# Patient Record
Sex: Female | Born: 1985 | Race: Black or African American | Hispanic: No | Marital: Single | Smoking: Never smoker
Health system: Southern US, Community
[De-identification: ages and names within clinical notes are randomized; demographics above are authoritative.]

## PROBLEM LIST (undated history)

## (undated) DIAGNOSIS — D631 Anemia in chronic kidney disease: Secondary | ICD-10-CM

## (undated) DIAGNOSIS — Z992 Dependence on renal dialysis: Secondary | ICD-10-CM

## (undated) DIAGNOSIS — F319 Bipolar disorder, unspecified: Secondary | ICD-10-CM

## (undated) DIAGNOSIS — N760 Acute vaginitis: Secondary | ICD-10-CM

## (undated) DIAGNOSIS — N189 Chronic kidney disease, unspecified: Secondary | ICD-10-CM

## (undated) DIAGNOSIS — Z765 Malingerer [conscious simulation]: Secondary | ICD-10-CM

## (undated) DIAGNOSIS — Z8614 Personal history of Methicillin resistant Staphylococcus aureus infection: Secondary | ICD-10-CM

## (undated) DIAGNOSIS — I422 Other hypertrophic cardiomyopathy: Secondary | ICD-10-CM

## (undated) DIAGNOSIS — E1043 Type 1 diabetes mellitus with diabetic autonomic (poly)neuropathy: Secondary | ICD-10-CM

## (undated) DIAGNOSIS — E785 Hyperlipidemia, unspecified: Secondary | ICD-10-CM

## (undated) DIAGNOSIS — Z9119 Patient's noncompliance with other medical treatment and regimen: Secondary | ICD-10-CM

## (undated) DIAGNOSIS — I82409 Acute embolism and thrombosis of unspecified deep veins of unspecified lower extremity: Secondary | ICD-10-CM

## (undated) DIAGNOSIS — H548 Legal blindness, as defined in USA: Secondary | ICD-10-CM

## (undated) DIAGNOSIS — N10 Acute tubulo-interstitial nephritis: Secondary | ICD-10-CM

## (undated) DIAGNOSIS — I10 Essential (primary) hypertension: Secondary | ICD-10-CM

## (undated) DIAGNOSIS — Z91199 Patient's noncompliance with other medical treatment and regimen due to unspecified reason: Secondary | ICD-10-CM

## (undated) DIAGNOSIS — K319 Disease of stomach and duodenum, unspecified: Secondary | ICD-10-CM

## (undated) DIAGNOSIS — R569 Unspecified convulsions: Secondary | ICD-10-CM

## (undated) DIAGNOSIS — I38 Endocarditis, valve unspecified: Secondary | ICD-10-CM

## (undated) DIAGNOSIS — D649 Anemia, unspecified: Secondary | ICD-10-CM

## (undated) HISTORY — PX: AV FISTULA PLACEMENT: SHX1204

## (undated) HISTORY — PX: OTHER SURGICAL HISTORY: SHX169

## (undated) HISTORY — DX: Acute vaginitis: N76.0

## (undated) HISTORY — DX: Legal blindness, as defined in USA: H54.8

## (undated) HISTORY — DX: Acute embolism and thrombosis of unspecified deep veins of unspecified lower extremity: I82.409

## (undated) HISTORY — DX: Other hypertrophic cardiomyopathy: I42.2

## (undated) HISTORY — DX: Anemia, unspecified: D64.9

## (undated) HISTORY — DX: Essential (primary) hypertension: I10

## (undated) HISTORY — PX: THROMBECTOMY AND REVISION OF ARTERIOVENTOUS (AV) GORETEX  GRAFT: SHX6120

## (undated) HISTORY — DX: Hyperlipidemia, unspecified: E78.5

## (undated) HISTORY — DX: Personal history of Methicillin resistant Staphylococcus aureus infection: Z86.14

## (undated) HISTORY — DX: Bipolar disorder, unspecified: F31.9

---

## 2009-05-16 ENCOUNTER — Ambulatory Visit: Payer: Self-pay | Admitting: Cardiovascular Disease

## 2009-05-16 ENCOUNTER — Inpatient Hospital Stay (HOSPITAL_COMMUNITY): Admission: EM | Admit: 2009-05-16 | Discharge: 2009-05-24 | Payer: Self-pay | Admitting: Emergency Medicine

## 2009-05-17 ENCOUNTER — Encounter (INDEPENDENT_AMBULATORY_CARE_PROVIDER_SITE_OTHER): Payer: Self-pay | Admitting: Internal Medicine

## 2009-09-04 ENCOUNTER — Ambulatory Visit: Payer: Self-pay | Admitting: Cardiology

## 2009-12-23 ENCOUNTER — Ambulatory Visit: Payer: Self-pay | Admitting: Cardiology

## 2009-12-23 ENCOUNTER — Inpatient Hospital Stay (HOSPITAL_COMMUNITY)
Admission: EM | Admit: 2009-12-23 | Discharge: 2009-12-30 | Payer: Self-pay | Source: Home / Self Care | Admitting: Emergency Medicine

## 2009-12-25 ENCOUNTER — Encounter (INDEPENDENT_AMBULATORY_CARE_PROVIDER_SITE_OTHER): Payer: Self-pay | Admitting: Internal Medicine

## 2010-01-16 ENCOUNTER — Ambulatory Visit (HOSPITAL_COMMUNITY): Payer: Self-pay | Admitting: Oncology

## 2010-01-16 ENCOUNTER — Encounter (HOSPITAL_COMMUNITY)
Admission: RE | Admit: 2010-01-16 | Discharge: 2010-02-15 | Payer: Self-pay | Source: Home / Self Care | Attending: Oncology | Admitting: Oncology

## 2010-03-13 ENCOUNTER — Other Ambulatory Visit (HOSPITAL_COMMUNITY): Payer: Self-pay

## 2010-03-15 ENCOUNTER — Ambulatory Visit (HOSPITAL_COMMUNITY): Payer: Self-pay | Admitting: Oncology

## 2010-03-17 ENCOUNTER — Inpatient Hospital Stay (HOSPITAL_COMMUNITY)
Admission: EM | Admit: 2010-03-17 | Discharge: 2010-03-24 | DRG: 603 | Disposition: A | Discharge status: Home / Self Care | Attending: Family Medicine | Admitting: Family Medicine

## 2010-03-17 DIAGNOSIS — IMO0002 Reserved for concepts with insufficient information to code with codable children: Secondary | ICD-10-CM | POA: Diagnosis present

## 2010-03-17 DIAGNOSIS — L03317 Cellulitis of buttock: Principal | ICD-10-CM

## 2010-03-17 DIAGNOSIS — A4902 Methicillin resistant Staphylococcus aureus infection, unspecified site: Secondary | ICD-10-CM | POA: Diagnosis present

## 2010-03-17 DIAGNOSIS — Z86718 Personal history of other venous thrombosis and embolism: Secondary | ICD-10-CM

## 2010-03-17 DIAGNOSIS — R0789 Other chest pain: Secondary | ICD-10-CM | POA: Diagnosis not present

## 2010-03-17 DIAGNOSIS — Z794 Long term (current) use of insulin: Secondary | ICD-10-CM

## 2010-03-17 DIAGNOSIS — E871 Hypo-osmolality and hyponatremia: Secondary | ICD-10-CM | POA: Diagnosis present

## 2010-03-17 DIAGNOSIS — Z9119 Patient's noncompliance with other medical treatment and regimen: Secondary | ICD-10-CM

## 2010-03-17 DIAGNOSIS — F319 Bipolar disorder, unspecified: Secondary | ICD-10-CM | POA: Diagnosis present

## 2010-03-17 DIAGNOSIS — Z91199 Patient's noncompliance with other medical treatment and regimen due to unspecified reason: Secondary | ICD-10-CM

## 2010-03-17 DIAGNOSIS — I129 Hypertensive chronic kidney disease with stage 1 through stage 4 chronic kidney disease, or unspecified chronic kidney disease: Secondary | ICD-10-CM | POA: Diagnosis present

## 2010-03-17 DIAGNOSIS — D638 Anemia in other chronic diseases classified elsewhere: Secondary | ICD-10-CM | POA: Diagnosis present

## 2010-03-17 DIAGNOSIS — L0231 Cutaneous abscess of buttock: Principal | ICD-10-CM | POA: Diagnosis present

## 2010-03-17 DIAGNOSIS — E1065 Type 1 diabetes mellitus with hyperglycemia: Secondary | ICD-10-CM | POA: Diagnosis present

## 2010-03-17 DIAGNOSIS — E876 Hypokalemia: Secondary | ICD-10-CM | POA: Diagnosis present

## 2010-03-17 DIAGNOSIS — N189 Chronic kidney disease, unspecified: Secondary | ICD-10-CM | POA: Diagnosis present

## 2010-03-17 DIAGNOSIS — N920 Excessive and frequent menstruation with regular cycle: Secondary | ICD-10-CM | POA: Diagnosis present

## 2010-03-17 LAB — BASIC METABOLIC PANEL
BUN: 19 mg/dL (ref 6–23)
Creatinine, Ser: 1.9 mg/dL — ABNORMAL HIGH (ref 0.4–1.2)
GFR calc non Af Amer: 33 mL/min — ABNORMAL LOW (ref >60)
Glucose, Bld: 783 mg/dL (ref 70–99)

## 2010-03-17 LAB — DIFFERENTIAL
Basophils Relative: 0 % (ref 0–1)
Eosinophils Relative: 0 % (ref 0–5)
Lymphs Abs: 1.1 10*3/uL (ref 0.7–4.0)
Monocytes Absolute: 0.7 10*3/uL (ref 0.1–1.0)
Neutro Abs: 8.8 10*3/uL — ABNORMAL HIGH (ref 1.7–7.7)

## 2010-03-17 LAB — CBC
HCT: 23.3 % — ABNORMAL LOW (ref 36.0–46.0)
MCH: 30.9 pg (ref 26.0–34.0)
MCHC: 36.5 g/dL — ABNORMAL HIGH (ref 30.0–36.0)
MCV: 84.7 fL (ref 78.0–100.0)
Platelets: 268 10*3/uL (ref 150–400)
RDW: 15.2 % (ref 11.5–15.5)

## 2010-03-17 LAB — GLUCOSE, CAPILLARY: Glucose-Capillary: 509 mg/dL — ABNORMAL HIGH (ref 70–99)

## 2010-03-17 LAB — PROTIME-INR: INR: 0.97 (ref 0.00–1.49)

## 2010-03-17 LAB — APTT: aPTT: 45 seconds — ABNORMAL HIGH (ref 24–37)

## 2010-03-18 LAB — DIFFERENTIAL
Basophils Absolute: 0 10*3/uL (ref 0.0–0.1)
Eosinophils Absolute: 0.1 10*3/uL (ref 0.0–0.7)
Eosinophils Relative: 1 % (ref 0–5)

## 2010-03-18 LAB — IRON AND TIBC
Iron: 16 ug/dL — ABNORMAL LOW (ref 42–135)
TIBC: 140 ug/dL — ABNORMAL LOW (ref 250–470)
UIBC: 124 ug/dL

## 2010-03-18 LAB — GLUCOSE, CAPILLARY
Glucose-Capillary: 246 mg/dL — ABNORMAL HIGH (ref 70–99)
Glucose-Capillary: 160 mg/dL — ABNORMAL HIGH (ref 70–99)
Glucose-Capillary: 208 mg/dL — ABNORMAL HIGH (ref 70–99)
Glucose-Capillary: 268 mg/dL — ABNORMAL HIGH (ref 70–99)

## 2010-03-18 LAB — BASIC METABOLIC PANEL
BUN: 17 mg/dL (ref 6–23)
Calcium: 7.9 mg/dL — ABNORMAL LOW (ref 8.4–10.5)
GFR calc non Af Amer: 32 mL/min — ABNORMAL LOW (ref >60)
Glucose, Bld: 134 mg/dL — ABNORMAL HIGH (ref 70–99)
Sodium: 141 mEq/L (ref 135–145)

## 2010-03-18 LAB — CBC
Platelets: 258 10*3/uL (ref 150–400)
RDW: 15.5 % (ref 11.5–15.5)
WBC: 10.5 10*3/uL (ref 4.0–10.5)

## 2010-03-18 LAB — FOLATE: Folate: 12.4 ng/mL

## 2010-03-19 ENCOUNTER — Inpatient Hospital Stay (HOSPITAL_COMMUNITY)

## 2010-03-19 LAB — BASIC METABOLIC PANEL
GFR calc Af Amer: 35 mL/min — ABNORMAL LOW (ref >60)
GFR calc non Af Amer: 29 mL/min — ABNORMAL LOW (ref >60)
Potassium: 3.6 mEq/L (ref 3.5–5.1)
Sodium: 140 mEq/L (ref 135–145)

## 2010-03-19 LAB — VANCOMYCIN, TROUGH: Vancomycin Tr: 15.8 ug/mL (ref 10.0–20.0)

## 2010-03-19 LAB — DIFFERENTIAL
Basophils Absolute: 0 10*3/uL (ref 0.0–0.1)
Basophils Relative: 0 % (ref 0–1)
Eosinophils Absolute: 0.1 10*3/uL (ref 0.0–0.7)
Eosinophils Relative: 1 % (ref 0–5)
Lymphocytes Relative: 20 % (ref 12–46)

## 2010-03-19 LAB — GLUCOSE, CAPILLARY
Glucose-Capillary: 380 mg/dL — ABNORMAL HIGH (ref 70–99)
Glucose-Capillary: 297 mg/dL — ABNORMAL HIGH (ref 70–99)
Glucose-Capillary: 248 mg/dL — ABNORMAL HIGH (ref 70–99)
Glucose-Capillary: 141 mg/dL — ABNORMAL HIGH (ref 70–99)

## 2010-03-19 LAB — CBC
HCT: 25.9 % — ABNORMAL LOW (ref 36.0–46.0)
Platelets: 314 10*3/uL (ref 150–400)
RDW: 15.8 % — ABNORMAL HIGH (ref 11.5–15.5)
WBC: 8 10*3/uL (ref 4.0–10.5)

## 2010-03-20 DIAGNOSIS — Z86718 Personal history of other venous thrombosis and embolism: Secondary | ICD-10-CM

## 2010-03-20 LAB — GLUCOSE, CAPILLARY
Glucose-Capillary: 86 mg/dL (ref 70–99)
Glucose-Capillary: 100 mg/dL — ABNORMAL HIGH (ref 70–99)
Glucose-Capillary: 138 mg/dL — ABNORMAL HIGH (ref 70–99)

## 2010-03-20 LAB — BASIC METABOLIC PANEL
BUN: 17 mg/dL (ref 6–23)
Calcium: 8.1 mg/dL — ABNORMAL LOW (ref 8.4–10.5)
Creatinine, Ser: 2.27 mg/dL — ABNORMAL HIGH (ref 0.4–1.2)
GFR calc Af Amer: 32 mL/min — ABNORMAL LOW (ref >60)
GFR calc non Af Amer: 27 mL/min — ABNORMAL LOW (ref >60)

## 2010-03-20 LAB — CBC
HCT: 22.2 % — ABNORMAL LOW (ref 36.0–46.0)
Hemoglobin: 7.6 g/dL — ABNORMAL LOW (ref 12.0–15.0)
RBC: 2.48 MIL/uL — ABNORMAL LOW (ref 3.87–5.11)

## 2010-03-20 LAB — DIFFERENTIAL
Basophils Absolute: 0 10*3/uL (ref 0.0–0.1)
Basophils Relative: 0 % (ref 0–1)
Lymphocytes Relative: 16 % (ref 12–46)
Monocytes Absolute: 0.7 10*3/uL (ref 0.1–1.0)
Monocytes Relative: 9 % (ref 3–12)
Neutro Abs: 6 10*3/uL (ref 1.7–7.7)
Neutrophils Relative %: 73 % (ref 43–77)

## 2010-03-20 LAB — HEMOGLOBIN AND HEMATOCRIT, BLOOD: Hemoglobin: 8.9 g/dL — ABNORMAL LOW (ref 12.0–15.0)

## 2010-03-20 LAB — PROTIME-INR
INR: 1.18 (ref 0.00–1.49)
Prothrombin Time: 15.2 seconds (ref 11.6–15.2)

## 2010-03-20 LAB — RETICULOCYTES
RBC.: 2.48 MIL/uL — ABNORMAL LOW (ref 3.87–5.11)
Retic Ct Pct: 2 % (ref 0.4–3.1)

## 2010-03-21 DIAGNOSIS — Z86718 Personal history of other venous thrombosis and embolism: Secondary | ICD-10-CM

## 2010-03-21 DIAGNOSIS — D649 Anemia, unspecified: Secondary | ICD-10-CM

## 2010-03-21 LAB — BASIC METABOLIC PANEL
BUN: 17 mg/dL (ref 6–23)
Calcium: 8.2 mg/dL — ABNORMAL LOW (ref 8.4–10.5)
Chloride: 115 mEq/L — ABNORMAL HIGH (ref 96–112)
Creatinine, Ser: 2.06 mg/dL — ABNORMAL HIGH (ref 0.4–1.2)

## 2010-03-21 LAB — HEMOGLOBIN AND HEMATOCRIT, BLOOD
HCT: 27.8 % — ABNORMAL LOW (ref 36.0–46.0)
Hemoglobin: 9.1 g/dL — ABNORMAL LOW (ref 12.0–15.0)

## 2010-03-21 LAB — CULTURE, ROUTINE-ABSCESS

## 2010-03-21 LAB — GLUCOSE, CAPILLARY
Glucose-Capillary: 72 mg/dL (ref 70–99)
Glucose-Capillary: 59 mg/dL — ABNORMAL LOW (ref 70–99)
Glucose-Capillary: 140 mg/dL — ABNORMAL HIGH (ref 70–99)
Glucose-Capillary: 200 mg/dL — ABNORMAL HIGH (ref 70–99)

## 2010-03-21 LAB — PROTIME-INR: Prothrombin Time: 14.2 seconds (ref 11.6–15.2)

## 2010-03-22 ENCOUNTER — Inpatient Hospital Stay (HOSPITAL_COMMUNITY)

## 2010-03-22 DIAGNOSIS — R079 Chest pain, unspecified: Secondary | ICD-10-CM

## 2010-03-22 LAB — CBC
Hemoglobin: 8.4 g/dL — ABNORMAL LOW (ref 12.0–15.0)
MCHC: 31.5 g/dL (ref 30.0–36.0)
Platelets: 390 10*3/uL (ref 150–400)
RBC: 2.89 MIL/uL — ABNORMAL LOW (ref 3.87–5.11)

## 2010-03-22 LAB — CROSSMATCH
ABO/RH(D): O POS
Unit division: 0

## 2010-03-22 LAB — GLUCOSE, CAPILLARY
Glucose-Capillary: 49 mg/dL — ABNORMAL LOW (ref 70–99)
Glucose-Capillary: 78 mg/dL (ref 70–99)
Glucose-Capillary: 90 mg/dL (ref 70–99)
Glucose-Capillary: 105 mg/dL — ABNORMAL HIGH (ref 70–99)
Glucose-Capillary: 72 mg/dL (ref 70–99)
Glucose-Capillary: 84 mg/dL (ref 70–99)

## 2010-03-22 LAB — PROTIME-INR: INR: 1.48 (ref 0.00–1.49)

## 2010-03-22 LAB — DIFFERENTIAL
Basophils Absolute: 0 10*3/uL (ref 0.0–0.1)
Basophils Relative: 0 % (ref 0–1)
Eosinophils Absolute: 0.2 10*3/uL (ref 0.0–0.7)
Neutro Abs: 4.8 10*3/uL (ref 1.7–7.7)
Neutrophils Relative %: 69 % (ref 43–77)

## 2010-03-22 LAB — CARDIAC PANEL(CRET KIN+CKTOT+MB+TROPI): CK, MB: 6 ng/mL — ABNORMAL HIGH (ref 0.3–4.0)

## 2010-03-22 MED ORDER — TECHNETIUM TO 99M ALBUMIN AGGREGATED
5.0000 | Freq: Once | INTRAVENOUS | Status: AC | PRN
  Administered 2010-03-22: 5.4 via INTRAVENOUS

## 2010-03-22 MED ORDER — XENON XE 133 GAS
10.0000 | GAS_FOR_INHALATION | Freq: Once | RESPIRATORY_TRACT | Status: AC | PRN
  Administered 2010-03-22: 16.7 via RESPIRATORY_TRACT

## 2010-03-22 NOTE — Discharge Summary (Signed)
  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  The patient admitted to the hospital with left buttock cellulitis.  Abscess was suspected.  She was seen in consultation by Surgery and performed ultrasound, which revealed no evidence of abscess.  Her culture of wound grew out gram-positive cocci in pairs and clusters, abundant staph aureus, which was further tested positive for MRSA.  Her anemia panel was performed.  She was transfused 1 unit on admission due to hemoglobin of 7.2  Her hemoglobin rose nicely to 9.2, and there was no diminished ferritin.  Seen in consultation by Hematology, was felt this was to be anemia of chronic disease.  Her hypertension was well controlled.  She defervesced on vancomycin and then had proper glycemic control with her long-acting Lantus insulin and the petrous sliding scale before meals and at bedtime.  She had her Coumadin resumed.  Her pro time was diminished while in the hospital and we subsequently wrote prescriptions for Coumadin to alter 5 mg every day alternating with 2.5 mg every day.  Her discharge diagnoses include: 1. Type 1 diabetes. 2. Recurrent DVT. 3. Anticoagulation. 4. Anemia of chronic disease. 5. Manic depressive disorder. 6.  Hypertension. 7. Chronic noncompliance. 8. Renal failure, chronic. 9. Light chains in the urine.  Her discharge medicines included: 1. Amlodipine 10 mg per day. 2. Clonidine 0.3 b.i.d. 3. Lexapro 20 mg per day. 4. Lantus insulin 36 units at bedtime. 5. Apidra insulin sliding scale before meals and at bedtime according     to sliding scale given. 6. Lisinopril 40 mg per day. 7. Potassium chloride 20 mEq per day. 8. Pravachol 40 mg per day. 9. Coumadin 5 mg alternating with 2.5 mg every other day.  The patient will follow up in the office in one week's time to check her pro time, to check her anemia, check her hemodynamics and blood pressure, assess buttock cellulitis which is not an abscess, and to follow up with Dr. Thornton Papas office for anemia of chronic disease as well as light chain evidenced in the urine.     Melvyn Novas, MD     RMD/MEDQ  D:  03/22/2010  T:  03/22/2010  Job:  [id number]  Electronically Signed by Oval Linsey MD on 03/22/2010 03:38:04 PM

## 2010-03-23 DIAGNOSIS — R072 Precordial pain: Secondary | ICD-10-CM

## 2010-03-23 LAB — PROTIME-INR: INR: 1.61 — ABNORMAL HIGH (ref 0.00–1.49)

## 2010-03-23 LAB — MAGNESIUM: Magnesium: 1.7 mg/dL (ref 1.5–2.5)

## 2010-03-23 LAB — GLUCOSE, CAPILLARY
Glucose-Capillary: 68 mg/dL — ABNORMAL LOW (ref 70–99)
Glucose-Capillary: 84 mg/dL (ref 70–99)

## 2010-03-23 LAB — DIFFERENTIAL
Basophils Absolute: 0 10*3/uL (ref 0.0–0.1)
Basophils Relative: 1 % (ref 0–1)
Eosinophils Absolute: 0.2 10*3/uL (ref 0.0–0.7)
Eosinophils Relative: 3 % (ref 0–5)
Lymphs Abs: 1.4 10*3/uL (ref 0.7–4.0)
Neutrophils Relative %: 62 % (ref 43–77)

## 2010-03-23 LAB — CBC
MCV: 92 fL (ref 78.0–100.0)
Platelets: 494 10*3/uL — ABNORMAL HIGH (ref 150–400)
RBC: 2.51 MIL/uL — ABNORMAL LOW (ref 3.87–5.11)
RDW: 16 % — ABNORMAL HIGH (ref 11.5–15.5)
WBC: 6.1 10*3/uL (ref 4.0–10.5)

## 2010-03-23 LAB — BASIC METABOLIC PANEL
BUN: 18 mg/dL (ref 6–23)
Calcium: 8 mg/dL — ABNORMAL LOW (ref 8.4–10.5)
Chloride: 116 mEq/L — ABNORMAL HIGH (ref 96–112)
Creatinine, Ser: 2.07 mg/dL — ABNORMAL HIGH (ref 0.4–1.2)
GFR calc Af Amer: 36 mL/min — ABNORMAL LOW (ref >60)
GFR calc non Af Amer: 30 mL/min — ABNORMAL LOW (ref >60)

## 2010-03-23 LAB — KAPPA/LAMBDA LIGHT CHAINS
Kappa free light chain: 3.76 mg/dL — ABNORMAL HIGH (ref 0.33–1.94)
Kappa, lambda light chain ratio: 1.9 — ABNORMAL HIGH (ref 0.26–1.65)
Lambda free light chains: 1.98 mg/dL (ref 0.57–2.63)

## 2010-03-24 LAB — GLUCOSE, CAPILLARY: Glucose-Capillary: 108 mg/dL — ABNORMAL HIGH (ref 70–99)

## 2010-03-24 LAB — PROTIME-INR
INR: 1.7 — ABNORMAL HIGH (ref 0.00–1.49)
Prothrombin Time: 20.2 seconds — ABNORMAL HIGH (ref 11.6–15.2)

## 2010-03-24 LAB — CULTURE, BLOOD (ROUTINE X 2): Culture: NO GROWTH

## 2010-03-24 NOTE — Progress Notes (Signed)
  [name], [name]             ACCOUNT NO.:  [id number]  MEDICAL RECORD NO.:  [id number]           PATIENT TYPE:  I  LOCATION:  A301                          FACILITY:  [facility]  PHYSICIAN:  Mila Homer. Sudie Bailey, M.D.DATE OF BIRTH:  23-Oct-1985  DATE OF PROCEDURE: DATE OF DISCHARGE:  03/17/2010                                PROGRESS NOTE   SUBJECTIVE:  She is still having a lot of pain from the left buttock. Her sugar has dropped down to the 100 range from the 750 count she had when she came in.  Nursing and I discussed this frequently over the last night.  The patient tells me she has been anemic over the years.  She does not have heavy menstrual periods.  OBJECTIVE:  VITAL SIGNS: Temperature 97.9, pulse 103, respiratory rate 16, blood pressure 109/66. GENERAL: She is in the right lateral decubitus position.  She is in distress due to pain from the left buttock. HEART: Regular rhythm rate of about 100 today. LUNGS:  Clear throughout and moving air well. BUTTOCKS: She still has tight swelling and tenderness, with some induration over the left buttock in an area at least 4 inch diameter, with a central skin breakdown, but no real drainage from this.  LABORATORY DATA:  Today's white cell count was 10,500, hemoglobin 7.2. Potassium 2.9, BUN 17, creatinine 1.93.  ASSESSMENT: 1. Left buttock abscess, probably secondary to Methicillin-resistant     Staphylococcus aureus . 2. Insulin-dependent diabetes, uncontrolled, but improved since     hospitalization. 3. Hypokalemia. 4. Chronic anemia. 5. History of proteinuria. 6. Renal insufficiency probably based on dehydration 7. Dehydration.  PLAN: 1. Continue with the vancomycin IV. 2. Anemia profile is pending. 3. Transfuse 1 unit of packed cells today. 4. KCl 40 mEq by mouth oral suspension b.i.d. 5. Recheck a CBC and BMP in the morning. 6. Continue with IV fluids.     Mila Homer. Sudie Bailey, M.D.     SDK/MEDQ  D:   03/18/2010  T:  03/18/2010  Job:  [id number]  Electronically Signed by John Giovanni M.D. on 03/24/2010 05:30:50 AM

## 2010-03-24 NOTE — H&P (Signed)
[name], [name]             ACCOUNT NO.:  [id number]  MEDICAL RECORD NO.:  [id number]           PATIENT TYPE:  E  LOCATION:  APED                          FACILITY:  [facility]  PHYSICIAN:  Mila Homer. Sudie Bailey, M.D.DATE OF BIRTH:  07-31-1985  DATE OF ADMISSION:  03/17/2010 DATE OF DISCHARGE:  LH                             HISTORY & PHYSICAL   This is a 25 year old patient of Melvyn Novas, MD presented to the [facility] emergency department today with severe pain in the left buttock.  She has long-term problems of insulin-dependent diabetes, which she has had since age 25, and hypertension.  On admission here in the Fall she was also found to have anasarca and edema.  She has a history of DVT and was at one point on Warfarin.  She has been taken care of by a number of physicians in the past including Dr. in [place], the [facility] at this hospital this fall, most recently Dr. Janna Arch.  She is currently on Lantus insulin and unknown blood pressure meds.  A panel shows a slight change in her urine.  She is due to see Ladona Horns. Neijstrom, MD, hematologist/oncologist, but I do not know if this was done.  Financial considerations are big factor in her not getting some of these things accomplished.  Currently she is in severe pain from this.  She is currently single, but lives with her boyfriend.  She denies alcohol use, smoking, illicit drugs.  PHYSICAL EXAMINATION:  GENERAL:  Exam showed a young woman who is obviously in severe distress due to pain.  She was oriented, alert, well- developed, well-nourished and in distress due to pain. VITAL SIGNS:  Temperature is 98.4, blood pressure 178/117, recheck was 163/98, pulse 125, respiratory rate 22. HEENT:  The mucous membranes were moist. SKIN:  Turgor was normal. HEART:  Her heart had a rate of 130 and regular without murmur. LUNGS:  The lungs appeared clear throughout. ABDOMEN:  The  abdomen was soft without organomegaly or mass or tenderness.  She had an area of tenderness and swelling of the left buttock region which was at least 4 inch diameter.  A scabbed over area centrally had been cultured by the ER staff before I arrived.  LABORATORY DATA:  Admission lab work included a CBC which showed a white cell count of 10,600, of which 83% neutrophils, 10 lymphs.  Hemoglobin 8.5.  Sodium 126, potassium 2.6, chloride 96, bicarbonate 10, glucose 783, BUN 19, creatinine 1.9.  Prolactin acid was 2.7 (normal is 0.5 to 2.2).  INR was 0.97 and PTT 45.  Recent x-rays, the CK of the chest done in December 2011 which showed no intrathoracic malignancy and improved anasarca and resolved pleural effusions.  She also had a CT of the abdomen and pelvis in November 2011 which showed anasarca thought to be probably secondary to fluid overload, and also anemia.  Venous Doppler done November 2011 showed an extensive, partially occlusive problem in the common femoral vein on the right extending to the femoral vein and marked subcutaneous edema.  This was similar to a prior study, April 2011,  thought to be chronic.  ADMISSION DIAGNOSES: 1. Left buttock abscess, probably methicillin resistant Staphylococcus     aureus. 2. Insulin dependent diabetes, uncontrolled. 3. Hypokalemia. 4. Hyponatremia. 5. Dehydration. 6. History of deep vein thrombosis of the right leg. 7. Anemia. 8. Proteinuria..  A surgeon has already been contacted and will I and D the abscess.  A culture of the abscess is already pending, as noted above.  She is on IV fluids and will receive potassium by mouth due to her bad veins.  We will be following her sugar closely and put her on an insulin drip, if need be.  She will be IV vancomycin.     Mila Homer. Sudie Bailey, M.D.     SDK/MEDQ  D:  03/17/2010  T:  03/17/2010  Job:  [id number]  Electronically Signed by John Giovanni M.D. on 03/24/2010 05:30:41 AM

## 2010-03-31 NOTE — Consult Note (Signed)
[name], [name] NO.:  [id number]  MEDICAL RECORD NO.:  [id number]           PATIENT TYPE:  LOCATION:                                 FACILITY:  PHYSICIAN:  Jonelle Sidle, MD DATE OF BIRTH:  21-Jun-1985  DATE OF CONSULTATION:  03/22/2010 DATE OF DISCHARGE:                                CONSULTATION   PRIMARY CARDIOLOGIST:  New, Dr. Nona Dell.  PRIMARY CARE PHYSICIAN:  Melvyn Novas, MD  REASON FOR CONSULTATION:  Chest pain.  HISTORY OF PRESENT ILLNESS:  This is a 25 year old African American female admitted with chest pain and left buttock cellulitis and pain. She had been treated with antibiotics, no I and D per surgery as they did an ultrasound and found that she does not have an abscess.  She has planned to go home today but continued to have substernal chest pain, radiating chest pain with tightness but unable to take a deep breath causing her to hurt, also it hurts to move her upper torso.  She states that this chest pain began 5 days ago with worsening severity.  She has been treated for Staph aureus and also is found to be very anemic with a blood hemoglobin of 7.2 and did receive a blood transfusion.  However, the pain continues as constant, worse with movement and palpation requiring a lot of pain medication.  We were requested to make further recommendations.  REVIEW OF SYSTEMS:  Positive for chest pain, difficulty taking deep breaths causing pain, pain in her chest with movement, painful left buttock from cellulitis.  She has generalized aches and pains of her joints.  All other systems are reviewed and are found to be negative.  PAST MEDICAL HISTORY: 1. Type 1 diabetes. 2. Recurrent DVT, anticoagulation for this. 3. Anemia of chronic disease.     a.     currently under consult by Dr. Mariel Sleet during this      hospitalization. 4. Manic depressive disorder. 5. Hypertension. 6. Chronic renal failure. 7. Chronic  noncompliance. 8. Hypercholesterolemia. 9. Most recent echocardiogram November 2011 reveals an EF of 55% with     no wall motion abnormalities at that time.  PAST SURGICAL HISTORY:  Cyst removal from her arm and leg.  SOCIAL HISTORY:  She lives in [place] with her boyfriend.  She is unemployed.  She does not smoke, drink, or use drugs.  FAMILY HISTORY:  Mother with diabetes.  Father is alive but unknown health issues.  She has 7 brothers and 1 sister, but she does not know their health status.  CURRENT MEDICATIONS:  Prior to admission: 1. Pravachol 40 mg at bedtime. 2. Norvasc 10 mg daily. 3. Warfarin 5 mg daily. 4. Lantus insulin 36 units daily. 5. Clonidine 0.3 mg b.i.d. 6. Apidra insulin per sliding scale.  ALLERGIES:  No known drug allergies.  CURRENT LABORATORY DATA:  Sodium 142, potassium 3.7, chloride 115, CO2 of 18, BUN 17, creatinine 2.0, and glucose 70.  Hemoglobin 8.4, hematocrit 26.7, white blood cells 7.1, and platelets 390.  Troponin 0.01.  She is positive for MRSA.  PT 14.2 and INR 1.0.  EKG  revealing normal sinus rhythm, tachycardia with a rate of 92 beats per minute with nonspecific T-wave changes.  Chest x-ray revealing question right pleural effusion.  PHYSICAL EXAMINATION:  VITAL SIGNS:  Blood pressure 107/72, pulse 86, respirations 16, temperature 97.6, and O2 saturation 100% on room air. GENERAL:  She is lethargic, sleepy, but responds to questions. HEENT:  Head is normocephalic and atraumatic.  Eyes:  PERRLA. NECK:  Supple without JVD or carotid bruits appreciated. CARDIOVASCULAR:  Tachycardic with a soft systolic murmur.  Pulses are 2+ and equal without bruits. LUNGS:  Diminished breath sounds with poor inspiratory effort.  It hurts to take a deep breath. ABDOMEN:  Soft and nontender with 2+ bowel sounds.  There is no rebound or guarding. EXTREMITIES:  Pretibial edema 1+ is noted. MUSCULOSKELETAL:  She has no joint deformity or effusions.  She  does have multiple tenderness in the neck, shoulders, and sternum with movement. NEUROLOGIC:  Diminished with sedation.  IMPRESSION: 1. Chest pain, atypical features.  Pain is described as constant,     worse with movement, deep breaths, and palpation.  We doubt cardiac     etiology for this pain.  However, we cannot rule out myocarditis or     pericardial effusion at this time.  Therefore, we will plan an     echocardiogram for left ventricular function and evaluation for     same.  The patient will also be checked for pulmonary embolism, but     the D-dimer and then need to consider V/Q lung scan if elevated as     she has chronic kidney disease. 2. Anemia.  Decreased hemoglobin to 8.4 from 9.1 overnight from     February 15 to March 22, 2010.  She is being followed by Dr.     Mariel Sleet and they will continue to make recommendations for same. 3. Lower extremity edema.  The patient states this is new, uncertain     of congestive heart failure as lungs are clear, but she is     diminished with poor inspiratory effort.  Pleural effusion is noted     on chest x-ray.  We will plan decubitus films on the right for  further evaluation and documentation of size.  On behalf of the physicians and providers of [facility], we would like to thank Dr. Janna Arch for allowing [place] to participate in the care of this patient.     Bettey Mare. Lyman Bishop, NP   ______________________________ Jonelle Sidle, MD    KML/MEDQ  D:  03/22/2010  T:  03/22/2010  Job:  [id number]  cc:   Melvyn Novas, MD Fax: [phone]  Electronically Signed by Joni Reining NP on 03/29/2010 02:00:13 PM Electronically Signed by Nona Dell MD on 03/31/2010 08:09:44 PM

## 2010-04-16 ENCOUNTER — Ambulatory Visit (HOSPITAL_COMMUNITY): Payer: Self-pay | Admitting: Oncology

## 2010-04-16 ENCOUNTER — Encounter (HOSPITAL_COMMUNITY): Attending: Oncology

## 2010-04-16 DIAGNOSIS — Z86718 Personal history of other venous thrombosis and embolism: Secondary | ICD-10-CM | POA: Insufficient documentation

## 2010-04-16 DIAGNOSIS — I82409 Acute embolism and thrombosis of unspecified deep veins of unspecified lower extremity: Secondary | ICD-10-CM

## 2010-04-16 DIAGNOSIS — Z7901 Long term (current) use of anticoagulants: Secondary | ICD-10-CM | POA: Insufficient documentation

## 2010-04-16 NOTE — Consult Note (Signed)
[name], [name]             ACCOUNT NO.:  [id number]  MEDICAL RECORD NO.:  [id number]           PATIENT TYPE:  LOCATION:                                 FACILITY:  PHYSICIAN:  Ladona Horns. Mariel Sleet, MD  DATE OF BIRTH:  June 02, 1985  DATE OF CONSULTATION: DATE OF DISCHARGE:                                CONSULTATION   REFERRING PHYSICIAN:  Melvyn Novas, MD  PRIMARY CARE PHYSICIAN:  Dr. Janna Arch  REASON FOR REFERRAL:  Anemia.  HISTORY OF PRESENT ILLNESS:  This is a 25 year old African American female with a past medical history significant for type 1 diabetes, recurrent DVT, manic depressive disorder, hypertension, anemia, acute renal failure light chains in urine, and depression who presented to the [facility] emergency department with severe pain in the left buttock.  She was admitted to the hospital due to a left buttock abscess which at that time was suspected to be methicillin-resistant Staphylococcus aureus.  The surgeon was contacted and I and D the abscess. Culture of the abscess is pending and the preliminary results reveal abundant white blood cells present.  Gram-positive cocci in pairs and in clusters.  Abundant Staphylococcus aureus.  An anemia panel was performed, and the patient was transfused 1 unit of packed red blood cells on March 18, 2010.  At that point in time, her hemoglobin was 7.2  and hematocrit  was 20.1.  Her hemoglobin rose nicely and was 9.2 on February 13.  Today, March 20, 2010, the patient's hemoglobin is 8.9 and hematocrit is 26.5.  The patient explains me that she continues to have heavy menstrual cycles.  This could correlate with her anemia.  She tells me she has not had any DVTs since I last saw her in the clinic on January 16, 2010.  She states she has been doing well at home since.  The patient tells me that she is comfortable.  She is moving her bowels well.  She is passing urine without difficulty.   The patient also tells me that she has been seeing an "eye physician" due to an unclear retinopathy.  She tells me that she saw Dr. Bing Plume in regard to her eyes and he has referred her to Dr. Lysle Dingwall in [place].  As mentioned above, she tells me that she continues to have heavy menstrual cycles which have been a chronic issue for her.  She also informs me that she has continued followup care with mental and behavioral health in regards to her major depressive distal order which she had told me she would do after our visit here in the clinic in December.  PAST MEDICAL HISTORY: 1. Recurrent DVT. 2. Anemia. 3. Type 1 diabetes. 4. Manic depressive disorder. 5. Hypertension. 6. Acute renal failure. 7. Light chains in urine.  ALLERGIES:  No known drug allergies.  CURRENT MEDICATIONS: 1. Amlodipine (Norvasc) 5 mg daily. 2. Ceftarolin (Teflaro) 600 mg every 12 hours. 3. Clonidine 0.3 mg b.i.d. 4. Low-molecular weight heparin (Lovenox 40 mg daily). 5. Lexapro 20 mg daily. 6. Insulin glargine (Lantus) 36 units at hour of sleep. 7. Insulin aspart (NovoLog) 2-5 at hour of  sleep. 8. Insulin aspart (NovoLog) 1-20 every 4 hours. 9. Lisinopril 40 mg daily. 10.Potassium chloride 20 mEq daily. 11.Simvastatin (Zocor) 20 mg daily at 1800 hours. 12.Warfarin (Coumadin) 5 mg daily at 1800 hours. 13.Clonidine 0.1 mg every 4 hours p.r.n. 14.Hydromorphone injection 2 mg every 3 hours p.r.n. 15.Ondansetron 4 mg every 4 hours p.r.n.  PAST SURGICAL HISTORY: 1. In 2004, cyst removal from genital area (Bartholin gland). 2. In 2002, cyst removal on the left arm.  FAMILY HISTORY:  The patient's father is approximate 73 years old and is alive and well.  The patient's mother is approximately 79 years old and she suffers from type 2 diabetes, eye problems, which required laser surgery, and hypertension.  The patient has seven brothers, all are alive and well.  Her oldest brother is 12 years old, 24 years  old, 10 years old, 24 years old, 24 years old, 24 years old, and 24 years old. As mentioned before they are alive and healthy.  The patient has 2 sisters one is 47 years old and the other is 24 years old.  They are both alive and well with no medical problem that she is aware of.  The patient admits to a family history of diabetes, heart disease, and hypertension.  The patient denies family history of stroke, cancer, bleeding disorders, asthma, arthritis, tuberculosis, epilepsy, mental illness, sickle cell anemia, and other symptoms such as those experienced by DVTs.  SOCIAL HISTORY:  The patient admits that she was born in [place].  She moved to [facility] in 2007.  She completed high school and took a few college courses.  Most recently, the patient was  employed at [employer].  She is no longer employed there at the present moment and is filing for disability.  The patient is not married.  She lives with her boyfriend together, they are renting.  She admits that their relationship is strong.  The patient denies any alcohol, drug, or tobacco use.  GYNECOLOGIC HISTORY:  The patient admits that she began menarche at the age of 70.  She has not yet reached menopause.  The patient admits that she was pregnant once, however, the pregnancy ended with miscarriage. She is G1, P0.  The patient has never breast fed.  The patient has never had a mammogram or breast biopsy.  The patient was referred for a Pap smear, and we do not have records of those results.  PERFORMANCE STATUS:  ECOG performance status is 0.  PSYCHOSOCIAL HISTORY:  The patient has a supportive boyfriend.  She keeps is in contact with her mother.  The patient support systems seems to be weak at this present moment.  PHYSICAL EXAMINATION:  VITAL SIGNS:  Vitals obtained at of 0510 hours revealed a temperature of 98.4, pulse 95, respirations 20, blood pressure 125/85, and oxygen saturation 94% on room  air. GENERAL:  The patient awake in bed.  Lethargic and tired (she recently had a hydromorphone injection).  The patient does not appear to be in acute distress.  She is alert and oriented x3. HEENT:  Atraumatic and normocephalic.  Anicteric sclerae.  No oral thrush noted.  No oral lesions appreciated. NECK:  Trachea midline. CARDIAC:  Regular rate and rhythm without murmur, rub, or gallop.  No S3 or S4 appreciated. LUNGS:  Clear to auscultation bilaterally without wheezes, rales, or rhonchi.  No accessory muscle use appreciated. ABDOMEN:  Positive bowel sounds in all 4 quadrants.  Full.  Nontender. EXTREMITIES:  No upper  extremity edema appreciated bilaterally.  2+ pitting edema in lower extremities up to 5.  Multiple healed lower extremity ulcers.  No lower extremity tenderness on palpation.  No tenderness in the popliteal fossa.  Dorsalis pedis and pedal pulses are intact. Lymphatics exam deferred. SKIN:  Capillary refill less than 1 second in fingers.  Radial pulses are regular and strong.  Skin is warm and dry.  Rapid turgor. NEURO:  No focal deficits appreciated.  The patient is alert and oriented x3.  The patient appears tired and lethargic, most likely attributed to receiving recent administration of hydromorphone.  LABORATORY DATA:  White blood cell count 8.2, hemoglobin 7.6, hematocrit 22.2, platelet count 334.  Sodium 141, potassium 3.4, chloride 112, bicarbonate 20, BUN 17, creatinine 2.27, glucose 142.  However, a hemoglobin and hematocrit was obtained and that reveals a hemoglobin 8.9, hematocrit 26.5.  PT 15.2, INR 1.18.  Reticulocyte percentage 2.0, RBCs 2.4, absolute reticulocyte count 49.6.  Anemia panel obtained on March 18, 2010, reveals an iron of 16, TIBC 140, percent saturation of 11, UIBC 124, vitamin B12 850, serum folate 12.4.  Ferritin 573.  RADIOGRAPHY:  A CT of chest with and without contrast performed on January 22, 2010, reveals no evidence of  intrathoracic malignancy.  ASSESSMENT: 1. Abscess on buttocks, probable methicillin-resistant Staphylococcus     aureus.  Preliminary results show gram-positive cocci in pairs and     in clusters, abundant Staphylococcus aureus. 2. Pain, well-controlled on hydromorphone. 3. Possible anemia of chronic disease. 4. Hypokalemia on potassium chloride. 5. History of deep vein thrombosis. 6. Diabetes type 1 on insulin. 7. History of light chains in urine. 8. Depression. 9. Manic-depressive disorder. 10.Hypertension. 11.Acute renal failure.  PLAN: 1. We will obtain a serum light chain assay. 2. This appears to be anemia of chronic disease.  Her ferritin level     is not low and anemia panel performed on March 18, 2010, her     ferritin is 573.  This rules out iron deficiency anemia.  The     patient tells me she continues to have heavy menstrual cycles, and     this could contribute to her anemia. 3. We will continue to follow the patient and assist as needed.  All questions were answered.  The patient knows to call the clinic with any problems, questions, or concerns.  The patient's plan was discussed with Dr. Mariel Sleet, and he is in agreement with the aforementioned.    ______________________________ Dellis Anes, PA   ______________________________ Ladona Horns. Mariel Sleet, MD    TK/MEDQ  D:  03/20/2010  T:  03/21/2010  Job:  [id number]  Electronically Signed by Dellis Anes III PA on 04/02/2010 04:47:43 PM Electronically Signed by Glenford Peers MD on 04/16/2010 04:42:29 PM

## 2010-04-17 LAB — PROTIME-INR
INR: 0.88 (ref 0.00–1.49)
INR: 1.01 (ref 0.00–1.49)
INR: 1.11 (ref 0.00–1.49)
INR: 1.47 (ref 0.00–1.49)
INR: 1.98 — ABNORMAL HIGH (ref 0.00–1.49)
Prothrombin Time: 12.1 seconds (ref 11.6–15.2)
Prothrombin Time: 14.5 seconds (ref 11.6–15.2)
Prothrombin Time: 18 seconds — ABNORMAL HIGH (ref 11.6–15.2)

## 2010-04-17 LAB — DIFFERENTIAL
Basophils Absolute: 0 10*3/uL (ref 0.0–0.1)
Basophils Absolute: 0 10*3/uL (ref 0.0–0.1)
Basophils Relative: 1 % (ref 0–1)
Basophils Relative: 1 % (ref 0–1)
Lymphocytes Relative: 30 % (ref 12–46)
Lymphocytes Relative: 30 % (ref 12–46)
Lymphocytes Relative: 44 % (ref 12–46)
Lymphs Abs: 1.4 10*3/uL (ref 0.7–4.0)
Lymphs Abs: 1.7 10*3/uL (ref 0.7–4.0)
Lymphs Abs: 1.7 10*3/uL (ref 0.7–4.0)
Monocytes Absolute: 0.3 10*3/uL (ref 0.1–1.0)
Monocytes Absolute: 0.4 10*3/uL (ref 0.1–1.0)
Monocytes Absolute: 0.4 10*3/uL (ref 0.1–1.0)
Monocytes Absolute: 0.5 10*3/uL (ref 0.1–1.0)
Monocytes Relative: 12 % (ref 3–12)
Monocytes Relative: 7 % (ref 3–12)
Monocytes Relative: 8 % (ref 3–12)
Monocytes Relative: 9 % (ref 3–12)
Neutro Abs: 1.7 10*3/uL (ref 1.7–7.7)
Neutro Abs: 1.9 10*3/uL (ref 1.7–7.7)
Neutro Abs: 2 10*3/uL (ref 1.7–7.7)
Neutro Abs: 2.6 10*3/uL (ref 1.7–7.7)
Neutro Abs: 3.8 10*3/uL (ref 1.7–7.7)
Neutrophils Relative %: 48 % (ref 43–77)
Neutrophils Relative %: 58 % (ref 43–77)

## 2010-04-17 LAB — PROTEIN, URINE, 24 HOUR
Collection Interval-UPROT: 24 hours
Protein, 24H Urine: 2205 mg/d — ABNORMAL HIGH (ref 50–100)
Protein, Urine: 147 mg/dL
Urine Total Volume-UPROT: 1500 mL

## 2010-04-17 LAB — CROSSMATCH
ABO/RH(D): O POS
Antibody Screen: NEGATIVE

## 2010-04-17 LAB — TSH: TSH: 1.735 u[IU]/mL (ref 0.350–4.500)

## 2010-04-17 LAB — URINALYSIS, ROUTINE W REFLEX MICROSCOPIC
Bilirubin Urine: NEGATIVE
Leukocytes, UA: NEGATIVE
Nitrite: NEGATIVE
Specific Gravity, Urine: 1.01 (ref 1.005–1.030)
Urobilinogen, UA: 0.2 mg/dL (ref 0.0–1.0)
pH: 6 (ref 5.0–8.0)

## 2010-04-17 LAB — CBC
HCT: 24.4 % — ABNORMAL LOW (ref 36.0–46.0)
HCT: 25 % — ABNORMAL LOW (ref 36.0–46.0)
HCT: 25.7 % — ABNORMAL LOW (ref 36.0–46.0)
Hemoglobin: 10.9 g/dL — ABNORMAL LOW (ref 12.0–15.0)
Hemoglobin: 8.5 g/dL — ABNORMAL LOW (ref 12.0–15.0)
Hemoglobin: 8.8 g/dL — ABNORMAL LOW (ref 12.0–15.0)
MCH: 30.5 pg (ref 26.0–34.0)
MCH: 31.7 pg (ref 26.0–34.0)
MCH: 32 pg (ref 26.0–34.0)
MCH: 32.1 pg (ref 26.0–34.0)
MCHC: 35.2 g/dL (ref 30.0–36.0)
MCHC: 34.2 g/dL (ref 30.0–36.0)
MCHC: 34.8 g/dL (ref 30.0–36.0)
MCV: 86.8 fL (ref 78.0–100.0)
MCV: 92 fL (ref 78.0–100.0)
MCV: 92 fL (ref 78.0–100.0)
MCV: 92.9 fL (ref 78.0–100.0)
Platelets: 347 10*3/uL (ref 150–400)
Platelets: 350 10*3/uL (ref 150–400)
Platelets: 385 10*3/uL (ref 150–400)
RBC: 2.66 MIL/uL — ABNORMAL LOW (ref 3.87–5.11)
RBC: 2.74 MIL/uL — ABNORMAL LOW (ref 3.87–5.11)
RDW: 14.9 % (ref 11.5–15.5)
RDW: 15.1 % (ref 11.5–15.5)
RDW: 15.1 % (ref 11.5–15.5)
WBC: 4 10*3/uL (ref 4.0–10.5)
WBC: 6.2 10*3/uL (ref 4.0–10.5)

## 2010-04-17 LAB — GLUCOSE, CAPILLARY
Glucose-Capillary: 110 mg/dL — ABNORMAL HIGH (ref 70–99)
Glucose-Capillary: 121 mg/dL — ABNORMAL HIGH (ref 70–99)
Glucose-Capillary: 128 mg/dL — ABNORMAL HIGH (ref 70–99)
Glucose-Capillary: 134 mg/dL — ABNORMAL HIGH (ref 70–99)
Glucose-Capillary: 137 mg/dL — ABNORMAL HIGH (ref 70–99)
Glucose-Capillary: 160 mg/dL — ABNORMAL HIGH (ref 70–99)
Glucose-Capillary: 163 mg/dL — ABNORMAL HIGH (ref 70–99)
Glucose-Capillary: 172 mg/dL — ABNORMAL HIGH (ref 70–99)
Glucose-Capillary: 183 mg/dL — ABNORMAL HIGH (ref 70–99)
Glucose-Capillary: 223 mg/dL — ABNORMAL HIGH (ref 70–99)
Glucose-Capillary: 227 mg/dL — ABNORMAL HIGH (ref 70–99)
Glucose-Capillary: 227 mg/dL — ABNORMAL HIGH (ref 70–99)
Glucose-Capillary: 282 mg/dL — ABNORMAL HIGH (ref 70–99)
Glucose-Capillary: 427 mg/dL — ABNORMAL HIGH (ref 70–99)
Glucose-Capillary: 51 mg/dL — ABNORMAL LOW (ref 70–99)
Glucose-Capillary: 74 mg/dL (ref 70–99)
Glucose-Capillary: 95 mg/dL (ref 70–99)
Glucose-Capillary: 96 mg/dL (ref 70–99)

## 2010-04-17 LAB — BASIC METABOLIC PANEL
BUN: 16 mg/dL (ref 6–23)
BUN: 21 mg/dL (ref 6–23)
CO2: 25 mEq/L (ref 19–32)
CO2: 26 mEq/L (ref 19–32)
CO2: 27 mEq/L (ref 19–32)
CO2: 27 mEq/L (ref 19–32)
Calcium: 8.1 mg/dL — ABNORMAL LOW (ref 8.4–10.5)
Calcium: 8.4 mg/dL (ref 8.4–10.5)
Calcium: 8.4 mg/dL (ref 8.4–10.5)
Chloride: 106 mEq/L (ref 96–112)
Chloride: 109 mEq/L (ref 96–112)
Creatinine, Ser: 1.02 mg/dL (ref 0.4–1.2)
Creatinine, Ser: 1.25 mg/dL — ABNORMAL HIGH (ref 0.4–1.2)
GFR calc Af Amer: 40 mL/min — ABNORMAL LOW (ref >60)
GFR calc Af Amer: 57 mL/min — ABNORMAL LOW (ref >60)
GFR calc Af Amer: >60 mL/min (ref >60)
GFR calc Af Amer: >60 mL/min (ref >60)
GFR calc non Af Amer: 33 mL/min — ABNORMAL LOW (ref >60)
GFR calc non Af Amer: 47 mL/min — ABNORMAL LOW (ref >60)
GFR calc non Af Amer: 60 mL/min — ABNORMAL LOW (ref >60)
Glucose, Bld: 132 mg/dL — ABNORMAL HIGH (ref 70–99)
Glucose, Bld: 156 mg/dL — ABNORMAL HIGH (ref 70–99)
Glucose, Bld: 182 mg/dL — ABNORMAL HIGH (ref 70–99)
Potassium: 3.7 mEq/L (ref 3.5–5.1)
Potassium: 4.1 mEq/L (ref 3.5–5.1)
Potassium: 4.5 mEq/L (ref 3.5–5.1)
Sodium: 137 mEq/L (ref 135–145)
Sodium: 140 mEq/L (ref 135–145)
Sodium: 141 mEq/L (ref 135–145)
Sodium: 148 mEq/L — ABNORMAL HIGH (ref 135–145)

## 2010-04-17 LAB — COMPREHENSIVE METABOLIC PANEL
Albumin: 2 g/dL — ABNORMAL LOW (ref 3.5–5.2)
Albumin: 2 g/dL — ABNORMAL LOW (ref 3.5–5.2)
Alkaline Phosphatase: 86 U/L (ref 39–117)
Alkaline Phosphatase: 91 U/L (ref 39–117)
BUN: 15 mg/dL (ref 6–23)
BUN: 17 mg/dL (ref 6–23)
Creatinine, Ser: 1.04 mg/dL (ref 0.4–1.2)
Creatinine, Ser: 1.06 mg/dL (ref 0.4–1.2)
GFR calc Af Amer: >60 mL/min (ref >60)
Glucose, Bld: 462 mg/dL — ABNORMAL HIGH (ref 70–99)
Potassium: 3.8 mEq/L (ref 3.5–5.1)
Potassium: 4.3 mEq/L (ref 3.5–5.1)
Total Protein: 5.9 g/dL — ABNORMAL LOW (ref 6.0–8.3)
Total Protein: 5.9 g/dL — ABNORMAL LOW (ref 6.0–8.3)

## 2010-04-17 LAB — PROTEIN ELECTROPH W RFLX QUANT IMMUNOGLOBULINS
Albumin ELP: 40.5 % — ABNORMAL LOW (ref 55.8–66.1)
Albumin ELP: 41 % — ABNORMAL LOW (ref 55.8–66.1)
Alpha-1-Globulin: 5.7 % — ABNORMAL HIGH (ref 2.9–4.9)
Alpha-1-Globulin: 5.7 % — ABNORMAL HIGH (ref 2.9–4.9)
Alpha-2-Globulin: 20.5 % — ABNORMAL HIGH (ref 7.1–11.8)
Beta 2: 9.2 % — ABNORMAL HIGH (ref 3.2–6.5)
Beta 2: 9.3 % — ABNORMAL HIGH (ref 3.2–6.5)
Beta Globulin: 7.8 % — ABNORMAL HIGH (ref 4.7–7.2)
Gamma Globulin: 15.8 % (ref 11.1–18.8)

## 2010-04-17 LAB — C3 COMPLEMENT: C3 Complement: 171 mg/dL (ref 88–201)

## 2010-04-17 LAB — IRON AND TIBC
Iron: 65 ug/dL (ref 42–135)
Saturation Ratios: 18 % — ABNORMAL LOW (ref 20–55)
UIBC: 246 ug/dL

## 2010-04-17 LAB — UIFE/LIGHT CHAINS/TP QN, 24-HR UR
Beta, Urine: DETECTED — AB
Free Kappa Lt Chains,Ur: 6.27 mg/dL — ABNORMAL HIGH (ref 0.04–1.51)
Free Lambda Excretion/Day: 19.2 mg/d
Free Lambda Lt Chains,Ur: 1.28 mg/dL — ABNORMAL HIGH (ref 0.08–1.01)
Free Lt Chn Excr Rate: 94.05 mg/d
Gamma Globulin, Urine: DETECTED — AB
Time: 24 hours
Total Protein, Urine-Ur/day: 1845 mg/d — ABNORMAL HIGH (ref 10–140)

## 2010-04-17 LAB — LUPUS ANTICOAGULANT PANEL

## 2010-04-17 LAB — HOMOCYSTEINE: Homocysteine: 15 umol/L (ref 4.0–15.4)

## 2010-04-17 LAB — URINE MICROSCOPIC-ADD ON

## 2010-04-17 LAB — BETA-2-GLYCOPROTEIN I ABS, IGG/M/A
Beta-2 Glyco I IgG: 0 G Units (ref <20)
Beta-2-Glycoprotein I IgM: 1 M Units (ref <20)

## 2010-04-17 LAB — HEPATITIS PANEL, ACUTE: HCV Ab: NEGATIVE

## 2010-04-17 LAB — HEPATIC FUNCTION PANEL
AST: 33 U/L (ref 0–37)
Albumin: 1.8 g/dL — ABNORMAL LOW (ref 3.5–5.2)
Total Bilirubin: 0.4 mg/dL (ref 0.3–1.2)
Total Protein: 5.3 g/dL — ABNORMAL LOW (ref 6.0–8.3)

## 2010-04-17 LAB — IGG, IGA, IGM
IgG (Immunoglobin G), Serum: 869 mg/dL (ref 694–1618)
IgM, Serum: 90 mg/dL (ref 60–263)

## 2010-04-17 LAB — PROTHROMBIN GENE MUTATION

## 2010-04-17 LAB — HIV ANTIBODY (ROUTINE TESTING W REFLEX): HIV: NONREACTIVE

## 2010-04-17 LAB — FERRITIN: Ferritin: 95 ng/mL (ref 10–291)

## 2010-04-17 LAB — CREATININE CLEARANCE, URINE, 24 HOUR
Creatinine: 1.89 mg/dL — ABNORMAL HIGH (ref 0.4–1.2)
Urine Total Volume-CRCL: 1500 mL

## 2010-04-17 LAB — HEMOGLOBIN A1C: Mean Plasma Glucose: 269 mg/dL — ABNORMAL HIGH (ref <117)

## 2010-04-17 LAB — POCT PREGNANCY, URINE: Preg Test, Ur: NEGATIVE

## 2010-04-17 LAB — APTT: aPTT: 27 seconds (ref 24–37)

## 2010-04-17 LAB — FOLATE: Folate: 6.1 ng/mL

## 2010-04-17 LAB — IMMUNOFIXATION ADD-ON

## 2010-04-17 LAB — CARDIAC PANEL(CRET KIN+CKTOT+MB+TROPI): Troponin I: 0.01 ng/mL (ref 0.00–0.06)

## 2010-04-17 LAB — C4 COMPLEMENT: Complement C4, Body Fluid: 43 mg/dL (ref 16–47)

## 2010-04-17 LAB — ABO/RH: ABO/RH(D): O POS

## 2010-04-17 LAB — PROTEIN S, TOTAL: Protein S Ag, Total: 182 % — ABNORMAL HIGH (ref 70–140)

## 2010-04-17 LAB — PROTEIN S ACTIVITY: Protein S Activity: 70 % (ref 69–129)

## 2010-04-17 LAB — ANTITHROMBIN III: AntiThromb III Func: 114 % (ref 76–126)

## 2010-04-17 LAB — CARDIOLIPIN ANTIBODIES, IGG, IGM, IGA: Anticardiolipin IgA: 5 APL U/mL — ABNORMAL LOW (ref <22)

## 2010-04-17 LAB — POCT CARDIAC MARKERS

## 2010-04-23 DIAGNOSIS — R0602 Shortness of breath: Secondary | ICD-10-CM

## 2010-04-24 LAB — DIFFERENTIAL
Basophils Absolute: 0 10*3/uL (ref 0.0–0.1)
Basophils Absolute: 0 10*3/uL (ref 0.0–0.1)
Basophils Relative: 1 % (ref 0–1)
Basophils Relative: 1 % (ref 0–1)
Eosinophils Absolute: 0.1 10*3/uL (ref 0.0–0.7)
Monocytes Relative: 11 % (ref 3–12)
Monocytes Relative: 9 % (ref 3–12)
Neutro Abs: 2 10*3/uL (ref 1.7–7.7)
Neutrophils Relative %: 47 % (ref 43–77)
Neutrophils Relative %: 52 % (ref 43–77)

## 2010-04-24 LAB — CBC
HCT: 25.1 % — ABNORMAL LOW (ref 36.0–46.0)
Hemoglobin: 8.8 g/dL — ABNORMAL LOW (ref 12.0–15.0)
MCHC: 35.1 g/dL (ref 30.0–36.0)
MCHC: 35.7 g/dL (ref 30.0–36.0)
MCHC: 36.3 g/dL — ABNORMAL HIGH (ref 30.0–36.0)
MCV: 92.2 fL (ref 78.0–100.0)
MCV: 94.1 fL (ref 78.0–100.0)
Platelets: 274 10*3/uL (ref 150–400)
Platelets: 355 10*3/uL (ref 150–400)
Platelets: 376 10*3/uL (ref 150–400)
RBC: 2.61 MIL/uL — ABNORMAL LOW (ref 3.87–5.11)
RBC: 2.72 MIL/uL — ABNORMAL LOW (ref 3.87–5.11)
RDW: 14.2 % (ref 11.5–15.5)
RDW: 14.2 % (ref 11.5–15.5)
RDW: 14.7 % (ref 11.5–15.5)

## 2010-04-24 LAB — GLUCOSE, CAPILLARY
Glucose-Capillary: 115 mg/dL — ABNORMAL HIGH (ref 70–99)
Glucose-Capillary: 121 mg/dL — ABNORMAL HIGH (ref 70–99)
Glucose-Capillary: 126 mg/dL — ABNORMAL HIGH (ref 70–99)
Glucose-Capillary: 148 mg/dL — ABNORMAL HIGH (ref 70–99)
Glucose-Capillary: 151 mg/dL — ABNORMAL HIGH (ref 70–99)
Glucose-Capillary: 169 mg/dL — ABNORMAL HIGH (ref 70–99)
Glucose-Capillary: 189 mg/dL — ABNORMAL HIGH (ref 70–99)
Glucose-Capillary: 224 mg/dL — ABNORMAL HIGH (ref 70–99)
Glucose-Capillary: 235 mg/dL — ABNORMAL HIGH (ref 70–99)
Glucose-Capillary: 368 mg/dL — ABNORMAL HIGH (ref 70–99)
Glucose-Capillary: 368 mg/dL — ABNORMAL HIGH (ref 70–99)

## 2010-04-24 LAB — BASIC METABOLIC PANEL
CO2: 27 mEq/L (ref 19–32)
Calcium: 8.5 mg/dL (ref 8.4–10.5)
Creatinine, Ser: 0.71 mg/dL (ref 0.4–1.2)
GFR calc Af Amer: >60 mL/min (ref >60)

## 2010-04-24 LAB — RETICULOCYTES
RBC.: 2.72 MIL/uL — ABNORMAL LOW (ref 3.87–5.11)
Retic Count, Absolute: 114.2 10*3/uL (ref 19.0–186.0)
Retic Ct Pct: 4.2 % — ABNORMAL HIGH (ref 0.4–3.1)

## 2010-04-24 LAB — BRAIN NATRIURETIC PEPTIDE: Pro B Natriuretic peptide (BNP): 31.6 pg/mL (ref 0.0–100.0)

## 2010-04-24 LAB — CARDIAC PANEL(CRET KIN+CKTOT+MB+TROPI)
Relative Index: 1.3 (ref 0.0–2.5)
Relative Index: INVALID (ref 0.0–2.5)
Total CK: 112 U/L (ref 7–177)
Troponin I: 0.01 ng/mL (ref 0.00–0.06)
Troponin I: 0.01 ng/mL (ref 0.00–0.06)
Troponin I: 0.01 ng/mL (ref 0.00–0.06)

## 2010-04-24 LAB — PROTIME-INR
INR: 0.96 (ref 0.00–1.49)
INR: 1.07 (ref 0.00–1.49)
INR: 1.7 — ABNORMAL HIGH (ref 0.00–1.49)
INR: 2.85 — ABNORMAL HIGH (ref 0.00–1.49)
Prothrombin Time: 12.7 seconds (ref 11.6–15.2)
Prothrombin Time: 13.8 seconds (ref 11.6–15.2)
Prothrombin Time: 29.7 seconds — ABNORMAL HIGH (ref 11.6–15.2)

## 2010-04-24 LAB — RAPID URINE DRUG SCREEN, HOSP PERFORMED
Amphetamines: NOT DETECTED
Barbiturates: NOT DETECTED
Benzodiazepines: NOT DETECTED

## 2010-04-24 LAB — FOLATE: Folate: 12.7 ng/mL

## 2010-04-24 LAB — IRON AND TIBC: TIBC: 269 ug/dL (ref 250–470)

## 2010-04-25 LAB — LUPUS ANTICOAGULANT PANEL
DRVVT: 42.1 secs (ref 36.2–44.3)
PTT Lupus Anticoagulant: 39.9 secs (ref 32.0–43.4)

## 2010-04-25 LAB — DIFFERENTIAL
Basophils Absolute: 0 10*3/uL (ref 0.0–0.1)
Basophils Absolute: 0 10*3/uL (ref 0.0–0.1)
Basophils Relative: 1 % (ref 0–1)
Basophils Relative: 1 % (ref 0–1)
Basophils Relative: 1 % (ref 0–1)
Eosinophils Absolute: 0.1 10*3/uL (ref 0.0–0.7)
Eosinophils Absolute: 0.1 10*3/uL (ref 0.0–0.7)
Eosinophils Relative: 2 % (ref 0–5)
Lymphocytes Relative: 39 % (ref 12–46)
Lymphs Abs: 1.8 10*3/uL (ref 0.7–4.0)
Monocytes Absolute: 0.5 10*3/uL (ref 0.1–1.0)
Monocytes Relative: 10 % (ref 3–12)
Monocytes Relative: 11 % (ref 3–12)
Neutro Abs: 1.9 10*3/uL (ref 1.7–7.7)
Neutro Abs: 2.1 10*3/uL (ref 1.7–7.7)
Neutro Abs: 2.6 10*3/uL (ref 1.7–7.7)
Neutrophils Relative %: 48 % (ref 43–77)
Neutrophils Relative %: 52 % (ref 43–77)

## 2010-04-25 LAB — HEMOGLOBIN A1C
Hgb A1c MFr Bld: 12.2 % — ABNORMAL HIGH (ref <5.7)
Mean Plasma Glucose: 303 mg/dL — ABNORMAL HIGH (ref <117)

## 2010-04-25 LAB — COMPREHENSIVE METABOLIC PANEL
ALT: 19 U/L (ref 0–35)
ALT: 20 U/L (ref 0–35)
Albumin: 2.6 g/dL — ABNORMAL LOW (ref 3.5–5.2)
Alkaline Phosphatase: 115 U/L (ref 39–117)
BUN: 10 mg/dL (ref 6–23)
BUN: 9 mg/dL (ref 6–23)
CO2: 23 mEq/L (ref 19–32)
Calcium: 7.9 mg/dL — ABNORMAL LOW (ref 8.4–10.5)
Chloride: 106 mEq/L (ref 96–112)
Creatinine, Ser: 0.69 mg/dL (ref 0.4–1.2)
GFR calc non Af Amer: >60 mL/min (ref >60)
Glucose, Bld: 301 mg/dL — ABNORMAL HIGH (ref 70–99)
Potassium: 3.7 mEq/L (ref 3.5–5.1)
Sodium: 135 mEq/L (ref 135–145)
Sodium: 136 mEq/L (ref 135–145)
Total Bilirubin: 0.8 mg/dL (ref 0.3–1.2)
Total Protein: 5.1 g/dL — ABNORMAL LOW (ref 6.0–8.3)
Total Protein: 6.2 g/dL (ref 6.0–8.3)

## 2010-04-25 LAB — LIPID PANEL
LDL Cholesterol: 97 mg/dL (ref 0–99)
VLDL: 16 mg/dL (ref 0–40)

## 2010-04-25 LAB — URINALYSIS, ROUTINE W REFLEX MICROSCOPIC
Bilirubin Urine: NEGATIVE
Nitrite: NEGATIVE
Specific Gravity, Urine: 1.015 (ref 1.005–1.030)
pH: 5.5 (ref 5.0–8.0)

## 2010-04-25 LAB — BASIC METABOLIC PANEL
BUN: 7 mg/dL (ref 6–23)
Calcium: 8.5 mg/dL (ref 8.4–10.5)
Creatinine, Ser: 0.63 mg/dL (ref 0.4–1.2)
GFR calc non Af Amer: >60 mL/min (ref >60)
Glucose, Bld: 328 mg/dL — ABNORMAL HIGH (ref 70–99)
Sodium: 135 mEq/L (ref 135–145)

## 2010-04-25 LAB — URINE MICROSCOPIC-ADD ON

## 2010-04-25 LAB — CBC
HCT: 24.1 % — ABNORMAL LOW (ref 36.0–46.0)
HCT: 28.1 % — ABNORMAL LOW (ref 36.0–46.0)
Hemoglobin: 10.2 g/dL — ABNORMAL LOW (ref 12.0–15.0)
Hemoglobin: 8.7 g/dL — ABNORMAL LOW (ref 12.0–15.0)
MCHC: 36 g/dL (ref 30.0–36.0)
MCHC: 36.7 g/dL — ABNORMAL HIGH (ref 30.0–36.0)
MCV: 94.6 fL (ref 78.0–100.0)
Platelets: 257 10*3/uL (ref 150–400)
Platelets: 271 10*3/uL (ref 150–400)
RBC: 2.55 MIL/uL — ABNORMAL LOW (ref 3.87–5.11)
RDW: 14.1 % (ref 11.5–15.5)
RDW: 14.1 % (ref 11.5–15.5)
WBC: 4.1 10*3/uL (ref 4.0–10.5)

## 2010-04-25 LAB — BETA-2-GLYCOPROTEIN I ABS, IGG/M/A: Beta-2-Glycoprotein I IgM: 6 M Units (ref <20)

## 2010-04-25 LAB — GLUCOSE, CAPILLARY
Glucose-Capillary: 203 mg/dL — ABNORMAL HIGH (ref 70–99)
Glucose-Capillary: 239 mg/dL — ABNORMAL HIGH (ref 70–99)
Glucose-Capillary: 323 mg/dL — ABNORMAL HIGH (ref 70–99)
Glucose-Capillary: 348 mg/dL — ABNORMAL HIGH (ref 70–99)
Glucose-Capillary: 419 mg/dL — ABNORMAL HIGH (ref 70–99)

## 2010-04-25 LAB — PROTEIN C, TOTAL: Protein C, Total: 138 % (ref 70–140)

## 2010-04-25 LAB — PROTIME-INR: Prothrombin Time: 13 seconds (ref 11.6–15.2)

## 2010-04-25 LAB — CARDIOLIPIN ANTIBODIES, IGG, IGM, IGA
Anticardiolipin IgG: 1 GPL U/mL — ABNORMAL LOW (ref <23)
Anticardiolipin IgM: 1 MPL U/mL — ABNORMAL LOW (ref <11)

## 2010-04-25 LAB — OSMOLALITY: Osmolality: 290 mOsm/kg (ref 275–300)

## 2010-04-25 LAB — PROTHROMBIN GENE MUTATION

## 2010-05-07 ENCOUNTER — Emergency Department (HOSPITAL_COMMUNITY)
Admission: EM | Admit: 2010-05-07 | Discharge: 2010-05-07 | Disposition: A | Discharge status: Home / Self Care | Payer: Medicaid Other | Attending: Emergency Medicine | Admitting: Emergency Medicine

## 2010-05-07 ENCOUNTER — Emergency Department (HOSPITAL_COMMUNITY): Payer: Medicaid Other

## 2010-05-07 DIAGNOSIS — R112 Nausea with vomiting, unspecified: Secondary | ICD-10-CM | POA: Insufficient documentation

## 2010-05-07 DIAGNOSIS — E860 Dehydration: Secondary | ICD-10-CM | POA: Insufficient documentation

## 2010-05-07 DIAGNOSIS — E119 Type 2 diabetes mellitus without complications: Secondary | ICD-10-CM | POA: Insufficient documentation

## 2010-05-07 DIAGNOSIS — Z79899 Other long term (current) drug therapy: Secondary | ICD-10-CM | POA: Insufficient documentation

## 2010-05-07 DIAGNOSIS — R197 Diarrhea, unspecified: Secondary | ICD-10-CM | POA: Insufficient documentation

## 2010-05-07 DIAGNOSIS — R109 Unspecified abdominal pain: Secondary | ICD-10-CM | POA: Insufficient documentation

## 2010-05-07 DIAGNOSIS — E876 Hypokalemia: Secondary | ICD-10-CM | POA: Insufficient documentation

## 2010-05-07 DIAGNOSIS — I10 Essential (primary) hypertension: Secondary | ICD-10-CM | POA: Insufficient documentation

## 2010-05-07 LAB — URINALYSIS, ROUTINE W REFLEX MICROSCOPIC
Glucose, UA: >1000 mg/dL — AB
Ketones, ur: NEGATIVE mg/dL
Leukocytes, UA: NEGATIVE
Protein, ur: >300 mg/dL — AB
Urobilinogen, UA: 0.2 mg/dL (ref 0.0–1.0)

## 2010-05-07 LAB — GLUCOSE, CAPILLARY
Glucose-Capillary: 466 mg/dL — ABNORMAL HIGH (ref 70–99)
Glucose-Capillary: 473 mg/dL — ABNORMAL HIGH (ref 70–99)
Glucose-Capillary: 190 mg/dL — ABNORMAL HIGH (ref 70–99)

## 2010-05-07 LAB — BLOOD GAS, ARTERIAL
Bicarbonate: 26.8 mEq/L — ABNORMAL HIGH (ref 20.0–24.0)
Patient temperature: 37
TCO2: 24.2 mmol/L (ref 0–100)
pCO2 arterial: 42.2 mmHg (ref 35.0–45.0)
pH, Arterial: 7.42 — ABNORMAL HIGH (ref 7.350–7.400)

## 2010-05-07 LAB — COMPREHENSIVE METABOLIC PANEL
ALT: 30 U/L (ref 0–35)
Alkaline Phosphatase: 244 U/L — ABNORMAL HIGH (ref 39–117)
BUN: 19 mg/dL (ref 6–23)
CO2: 26 mEq/L (ref 19–32)
Chloride: 92 mEq/L — ABNORMAL LOW (ref 96–112)
GFR calc non Af Amer: 25 mL/min — ABNORMAL LOW (ref >60)
Glucose, Bld: 483 mg/dL — ABNORMAL HIGH (ref 70–99)
Potassium: 3 mEq/L — ABNORMAL LOW (ref 3.5–5.1)
Sodium: 129 mEq/L — ABNORMAL LOW (ref 135–145)
Total Bilirubin: 0.3 mg/dL (ref 0.3–1.2)

## 2010-05-07 LAB — CBC
HCT: 32 % — ABNORMAL LOW (ref 36.0–46.0)
MCHC: 34.4 g/dL (ref 30.0–36.0)
Platelets: 426 10*3/uL — ABNORMAL HIGH (ref 150–400)
RDW: 14.1 % (ref 11.5–15.5)
WBC: 6.7 10*3/uL (ref 4.0–10.5)

## 2010-05-07 LAB — LIPASE, BLOOD: Lipase: 28 U/L (ref 11–59)

## 2010-05-08 LAB — GLUCOSE, CAPILLARY

## 2010-05-10 LAB — URINE CULTURE

## 2010-06-04 ENCOUNTER — Emergency Department (HOSPITAL_COMMUNITY)
Admission: EM | Admit: 2010-06-04 | Discharge: 2010-06-04 | Disposition: A | Discharge status: Home / Self Care | Attending: Emergency Medicine | Admitting: Emergency Medicine

## 2010-06-04 ENCOUNTER — Emergency Department (HOSPITAL_COMMUNITY)

## 2010-06-04 ENCOUNTER — Ambulatory Visit (HOSPITAL_COMMUNITY)
Admission: RE | Admit: 2010-06-04 | Discharge: 2010-06-04 | Disposition: A | Discharge status: Home / Self Care | Source: Ambulatory Visit | Attending: Ophthalmology | Admitting: Ophthalmology

## 2010-06-04 DIAGNOSIS — Z794 Long term (current) use of insulin: Secondary | ICD-10-CM | POA: Insufficient documentation

## 2010-06-04 DIAGNOSIS — Z01818 Encounter for other preprocedural examination: Secondary | ICD-10-CM | POA: Insufficient documentation

## 2010-06-04 DIAGNOSIS — Z01812 Encounter for preprocedural laboratory examination: Secondary | ICD-10-CM | POA: Insufficient documentation

## 2010-06-04 DIAGNOSIS — Z538 Procedure and treatment not carried out for other reasons: Secondary | ICD-10-CM | POA: Insufficient documentation

## 2010-06-04 DIAGNOSIS — N390 Urinary tract infection, site not specified: Secondary | ICD-10-CM | POA: Insufficient documentation

## 2010-06-04 DIAGNOSIS — E7800 Pure hypercholesterolemia, unspecified: Secondary | ICD-10-CM | POA: Insufficient documentation

## 2010-06-04 DIAGNOSIS — I129 Hypertensive chronic kidney disease with stage 1 through stage 4 chronic kidney disease, or unspecified chronic kidney disease: Secondary | ICD-10-CM | POA: Insufficient documentation

## 2010-06-04 DIAGNOSIS — R0789 Other chest pain: Secondary | ICD-10-CM | POA: Insufficient documentation

## 2010-06-04 DIAGNOSIS — N189 Chronic kidney disease, unspecified: Secondary | ICD-10-CM | POA: Insufficient documentation

## 2010-06-04 DIAGNOSIS — D649 Anemia, unspecified: Secondary | ICD-10-CM | POA: Insufficient documentation

## 2010-06-04 DIAGNOSIS — E1069 Type 1 diabetes mellitus with other specified complication: Secondary | ICD-10-CM | POA: Insufficient documentation

## 2010-06-04 DIAGNOSIS — R110 Nausea: Secondary | ICD-10-CM | POA: Insufficient documentation

## 2010-06-04 DIAGNOSIS — N289 Disorder of kidney and ureter, unspecified: Secondary | ICD-10-CM | POA: Insufficient documentation

## 2010-06-04 DIAGNOSIS — R609 Edema, unspecified: Secondary | ICD-10-CM | POA: Insufficient documentation

## 2010-06-04 LAB — CBC
HCT: 22.2 % — ABNORMAL LOW (ref 36.0–46.0)
Hemoglobin: 7.9 g/dL — ABNORMAL LOW (ref 12.0–15.0)
RBC: 2.62 MIL/uL — ABNORMAL LOW (ref 3.87–5.11)

## 2010-06-04 LAB — POCT I-STAT, CHEM 8
BUN: 24 mg/dL — ABNORMAL HIGH (ref 6–23)
Creatinine, Ser: 1.9 mg/dL — ABNORMAL HIGH (ref 0.4–1.2)
Hemoglobin: 7.8 g/dL — ABNORMAL LOW (ref 12.0–15.0)
Potassium: 3 mEq/L — ABNORMAL LOW (ref 3.5–5.1)
Sodium: 134 mEq/L — ABNORMAL LOW (ref 135–145)

## 2010-06-04 LAB — POCT CARDIAC MARKERS
CKMB, poc: 4.1 ng/mL (ref 1.0–8.0)
Myoglobin, poc: >500 ng/mL (ref 12–200)

## 2010-06-04 LAB — URINALYSIS, ROUTINE W REFLEX MICROSCOPIC
Ketones, ur: NEGATIVE mg/dL
Nitrite: NEGATIVE
Protein, ur: >300 mg/dL — AB
Urobilinogen, UA: 0.2 mg/dL (ref 0.0–1.0)

## 2010-06-04 LAB — BASIC METABOLIC PANEL
Calcium: 8.2 mg/dL — ABNORMAL LOW (ref 8.4–10.5)
GFR calc Af Amer: 40 mL/min — ABNORMAL LOW (ref >60)
GFR calc non Af Amer: 33 mL/min — ABNORMAL LOW (ref >60)
Sodium: 133 mEq/L — ABNORMAL LOW (ref 135–145)

## 2010-06-04 LAB — PROTIME-INR
INR: 0.98 (ref 0.00–1.49)
Prothrombin Time: 13.2 seconds (ref 11.6–15.2)

## 2010-06-04 LAB — GLUCOSE, CAPILLARY
Glucose-Capillary: 471 mg/dL — ABNORMAL HIGH (ref 70–99)
Glucose-Capillary: 455 mg/dL — ABNORMAL HIGH (ref 70–99)

## 2010-06-04 LAB — URINE MICROSCOPIC-ADD ON

## 2010-06-04 LAB — POCT PREGNANCY, URINE: Preg Test, Ur: NEGATIVE

## 2010-06-06 ENCOUNTER — Ambulatory Visit (HOSPITAL_COMMUNITY)
Admission: RE | Admit: 2010-06-06 | Discharge: 2010-06-06 | Disposition: A | Discharge status: Home / Self Care | Source: Ambulatory Visit | Attending: Ophthalmology | Admitting: Ophthalmology

## 2010-06-06 DIAGNOSIS — Z538 Procedure and treatment not carried out for other reasons: Secondary | ICD-10-CM | POA: Insufficient documentation

## 2010-06-06 DIAGNOSIS — Z01812 Encounter for preprocedural laboratory examination: Secondary | ICD-10-CM | POA: Insufficient documentation

## 2010-06-06 LAB — URINE CULTURE

## 2010-06-14 DIAGNOSIS — I319 Disease of pericardium, unspecified: Secondary | ICD-10-CM

## 2010-06-14 DIAGNOSIS — I5030 Unspecified diastolic (congestive) heart failure: Secondary | ICD-10-CM

## 2010-06-15 DIAGNOSIS — I5031 Acute diastolic (congestive) heart failure: Secondary | ICD-10-CM

## 2010-06-25 ENCOUNTER — Ambulatory Visit (HOSPITAL_COMMUNITY)
Admission: RE | Admit: 2010-06-25 | Discharge: 2010-06-25 | Disposition: A | Discharge status: Home / Self Care | Source: Ambulatory Visit | Attending: Ophthalmology | Admitting: Ophthalmology

## 2010-06-25 DIAGNOSIS — H211X9 Other vascular disorders of iris and ciliary body, unspecified eye: Secondary | ICD-10-CM | POA: Insufficient documentation

## 2010-06-25 DIAGNOSIS — H35379 Puckering of macula, unspecified eye: Secondary | ICD-10-CM | POA: Insufficient documentation

## 2010-06-25 DIAGNOSIS — Z01812 Encounter for preprocedural laboratory examination: Secondary | ICD-10-CM | POA: Insufficient documentation

## 2010-06-25 DIAGNOSIS — E1139 Type 2 diabetes mellitus with other diabetic ophthalmic complication: Secondary | ICD-10-CM | POA: Insufficient documentation

## 2010-06-25 DIAGNOSIS — H269 Unspecified cataract: Secondary | ICD-10-CM | POA: Insufficient documentation

## 2010-06-25 DIAGNOSIS — E11359 Type 2 diabetes mellitus with proliferative diabetic retinopathy without macular edema: Secondary | ICD-10-CM | POA: Insufficient documentation

## 2010-06-25 DIAGNOSIS — H4310 Vitreous hemorrhage, unspecified eye: Secondary | ICD-10-CM | POA: Insufficient documentation

## 2010-06-25 LAB — BASIC METABOLIC PANEL
CO2: 23 mEq/L (ref 19–32)
Chloride: 105 mEq/L (ref 96–112)
GFR calc non Af Amer: 28 mL/min — ABNORMAL LOW (ref >60)
Glucose, Bld: 169 mg/dL — ABNORMAL HIGH (ref 70–99)
Potassium: 3.5 mEq/L (ref 3.5–5.1)
Sodium: 140 mEq/L (ref 135–145)

## 2010-06-25 LAB — CBC
HCT: 31.7 % — ABNORMAL LOW (ref 36.0–46.0)
Hemoglobin: 10.9 g/dL — ABNORMAL LOW (ref 12.0–15.0)
RBC: 3.62 MIL/uL — ABNORMAL LOW (ref 3.87–5.11)
WBC: 6.3 10*3/uL (ref 4.0–10.5)

## 2010-06-25 LAB — GLUCOSE, CAPILLARY: Glucose-Capillary: 165 mg/dL — ABNORMAL HIGH (ref 70–99)

## 2010-06-28 ENCOUNTER — Ambulatory Visit (HOSPITAL_COMMUNITY)
Admission: AD | Admit: 2010-06-28 | Discharge: 2010-06-29 | Disposition: A | Discharge status: Home / Self Care | Source: Ambulatory Visit | Attending: Ophthalmology | Admitting: Ophthalmology

## 2010-06-28 DIAGNOSIS — H409 Unspecified glaucoma: Secondary | ICD-10-CM | POA: Insufficient documentation

## 2010-06-28 DIAGNOSIS — H4089 Other specified glaucoma: Secondary | ICD-10-CM | POA: Insufficient documentation

## 2010-06-28 DIAGNOSIS — H3500 Unspecified background retinopathy: Secondary | ICD-10-CM | POA: Insufficient documentation

## 2010-06-28 DIAGNOSIS — E1139 Type 2 diabetes mellitus with other diabetic ophthalmic complication: Secondary | ICD-10-CM | POA: Insufficient documentation

## 2010-06-28 DIAGNOSIS — Z794 Long term (current) use of insulin: Secondary | ICD-10-CM | POA: Insufficient documentation

## 2010-06-28 DIAGNOSIS — H2100 Hyphema, unspecified eye: Secondary | ICD-10-CM | POA: Insufficient documentation

## 2010-06-28 DIAGNOSIS — N049 Nephrotic syndrome with unspecified morphologic changes: Secondary | ICD-10-CM | POA: Insufficient documentation

## 2010-06-28 DIAGNOSIS — H2140 Pupillary membranes, unspecified eye: Secondary | ICD-10-CM | POA: Insufficient documentation

## 2010-06-28 DIAGNOSIS — H4310 Vitreous hemorrhage, unspecified eye: Secondary | ICD-10-CM | POA: Insufficient documentation

## 2010-06-28 DIAGNOSIS — D649 Anemia, unspecified: Secondary | ICD-10-CM | POA: Insufficient documentation

## 2010-06-28 DIAGNOSIS — I10 Essential (primary) hypertension: Secondary | ICD-10-CM | POA: Insufficient documentation

## 2010-06-28 LAB — GLUCOSE, CAPILLARY: Glucose-Capillary: 151 mg/dL — ABNORMAL HIGH (ref 70–99)

## 2010-06-29 LAB — GLUCOSE, CAPILLARY
Glucose-Capillary: 229 mg/dL — ABNORMAL HIGH (ref 70–99)
Glucose-Capillary: 195 mg/dL — ABNORMAL HIGH (ref 70–99)

## 2010-06-29 LAB — CBC
Hemoglobin: 9.9 g/dL — ABNORMAL LOW (ref 12.0–15.0)
MCHC: 33.2 g/dL (ref 30.0–36.0)
RBC: 3.35 MIL/uL — ABNORMAL LOW (ref 3.87–5.11)

## 2010-06-29 LAB — MRSA PCR SCREENING: MRSA by PCR: POSITIVE — AB

## 2010-06-29 LAB — BASIC METABOLIC PANEL
CO2: 23 mEq/L (ref 19–32)
Calcium: 8.4 mg/dL (ref 8.4–10.5)
GFR calc Af Amer: 37 mL/min — ABNORMAL LOW (ref >60)
GFR calc non Af Amer: 31 mL/min — ABNORMAL LOW (ref >60)
Sodium: 140 mEq/L (ref 135–145)

## 2010-07-03 NOTE — Consult Note (Signed)
[name], [name] NO.:  [id number]  MEDICAL RECORD NO.:  [id number]           PATIENT TYPE:  I  LOCATION:  5125                         FACILITY:  [facility]  PHYSICIAN:  Hillery Aldo, M.D.   DATE OF BIRTH:  02/03/1986  DATE OF CONSULTATION:  06/28/2010 DATE OF DISCHARGE:                                CONSULTATION   PRIMARY CARE PHYSICIAN:  Melvyn Novas, MD  OPHTHALMOLOGIST:  Jillyn Hidden A. Rankin, MD  PERSON REQUESTING CONSULTATION:  Alford Highland. Rankin, MD  REASON FOR CONSULTATION:  Medical management of chronic medical conditions.  CHIEF COMPLAINT:  Headaches.  HISTORY OF PRESENT ILLNESS:  The patient is a 25 year old female with past medical history of diabetic retinopathy and neovascular glaucoma who originally underwent posterior vitrectomy with membrane peel on the right eye with endolaser panphotocoagulation, pars plana lensectomy, inferior peripheral iridectomy, and injection of vitreous substitute on Jun 25, 2010.  She apparently did well postoperatively, but was brought back to the operating room today for a second vitrectomy secondary to hemorrhaging.  The patient is currently complaining of postoperative headache and right eye pain as well as mild dyspnea, but is otherwise asymptomatic.  We are asked by the ophthalmologist to manage her chronic medical conditions including type 1 diabetes, hypertension, and chronic kidney disease.  PAST MEDICAL HISTORY: 1. Type 1 diabetes. 2. Hypertension. 3. History of left buttock MRSA cellulitis. 4. History of recurrent DVTs 5. Anemia of chronic disease. 6. Manic depressive disorder. 7. Retinopathy. 8. Glaucoma. 9. Chronic kidney disease, stage III. 10.Hyperlipidemia. 11.Medical noncompliance. 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  PAST SURGICAL HISTORY: 1. Cyst removal.  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 Right eye surgery/vitrectomy.  FAMILY HISTORY:  The patient's mother is alive  with hypertension and diabetes.  The patient's father's history is unknown.  She has 8 siblings of whom she does not have any information regarding their medical problems.  SOCIAL HISTORY:  The patient lives in [place] with boyfriend.  She is unemployed/disabled.  She has no history of tobacco, alcohol, or drug use.  ALLERGIES:  No known drug allergies.  CURRENT MEDICATIONS: 1. Norvasc 10 mg p.o. daily. 2. Lisinopril 20 mg p.o. b.i.d. 3. Lasix 40 mg p.o. daily. 4. Lantus 20-36 units subcutaneously daily based on CBG. 5. Labetalol 300 mg p.o. b.i.d. 6. Iron OTC 1 tablet p.o. daily. 7. Humalog 5-20 units sliding scale q.a.c. 8. Clonidine 0.3 mg p.o. t.i.d. 9. Carvedilol CR 10 mg p.o. daily. 10.Pravachol 40 mg p.o. every night. 11.Zoloft 50 mg p.o. daily. 12.Potassium chloride 20 mEq p.o. daily.  REVIEW OF SYSTEMS:  A comprehensive 14-point review of systems is unremarkable except as noted in the elements of the HPI above.  PHYSICAL EXAMINATION:  VITAL SIGNS:  Temperature 98.5, pulse 101, respirations 16, and blood pressure 166/103. GENERAL:  A well-developed, well-nourished [place] female who is irritable. HEENT:  Normocephalic and atraumatic.  There is a right eye bandage intact.  Eye exam not formally done at this time secondary to patient's discomfort.  Oropharynx is clear. NECK:  Supple, no thyromegaly, no lymphadenopathy, no jugular venous distention. CHEST:  Lungs are clear to auscultation bilaterally with good air movement.  There is a Port-A-Cath needle inserted in the left anterior chest wall. HEART:  Tachycardic rate, regular rhythm.  No murmurs, rubs, or gallops. ABDOMEN:  Soft, nontender, and nondistended with normoactive bowel sounds. EXTREMITIES:  1+ edema bilaterally. SKIN:  Warm and dry.  No rashes. NEUROLOGIC:  The patient is alert and oriented x3.  Nonfocal.  DATA REVIEW:  Labs from 3 days ago shows a sodium of 140, potassium 3.5, chloride 105,  bicarb 23, BUN 33, creatinine 2.18, glucose 169, calcium 9.6.  White blood cell count 6.3, hemoglobin 10.9, hematocrit 31.7, and platelets 375.  ASSESSMENT/PLAN: 1. Type 1 diabetes:  We will continue the patient's usual dose of     Lantus and sliding scale.  We will check hemoglobin A1c to     determine her overall outpatient glycemic control. 2. Hypertension:  We will continue the patient's usual home medicines     and monitor her blood pressure closely. 3. Stage III chronic kidney disease:  We will monitor the patient's     creatinine while in the hospital. 4. History of recurrent deep vein thrombosis:  We will defer to the     ophthalmologist regarding whether or not Lovenox can be used for     DVT prophylaxis since she is here with a vitreous hemorrhage. 5. Anemia of chronic disease:  The patient's hemoglobin and hematocrit     appear to be stable. 6. Hyperlipidemia:  We will continue the patient's statin therapy. 7. Manic depressive disorder:  We will continue the patient's usual     home medications.  TIME SPENT ON CONSULTATION:  It was approximately 45 minutes.  We will follow this patient with you.     Hillery Aldo, M.D.    CR/MEDQ  D:  06/28/2010  T:  06/29/2010  Job:  [id number]  cc:   Melvyn Novas, MD  Electronically Signed by Hillery Aldo M.D. on 07/03/2010 01:09:11 PM

## 2010-07-17 ENCOUNTER — Encounter: Payer: Self-pay | Admitting: *Deleted

## 2010-07-21 ENCOUNTER — Encounter: Payer: Self-pay | Admitting: Cardiology

## 2010-07-23 ENCOUNTER — Encounter: Admitting: Cardiology

## 2010-07-23 ENCOUNTER — Encounter: Payer: Self-pay | Admitting: Cardiology

## 2010-07-24 ENCOUNTER — Encounter: Payer: Self-pay | Admitting: *Deleted

## 2010-09-12 NOTE — Op Note (Signed)
[name], [name] [name]             ACCOUNT NO.:  [id number]  MEDICAL RECORD NO.:  [id number]           PATIENT TYPE:  LOCATION:                                 FACILITY:  PHYSICIAN:  Alford Highland. Kameran Mcneese, M.D.   DATE OF BIRTH:  17-May-1985  DATE OF PROCEDURE:  06/28/2010 DATE OF DISCHARGE:                              OPERATIVE REPORT   PREOPERATIVE DIAGNOSES: 1. Neovascular glaucoma, out of control, right eye with severe ocular     pain. 2. Pupillary fibrin membrane after recent vitrectomy repair retinal     detachment complex and placement of silicone oil. 3. Recurrent vitreous hemorrhage. 4. Hyphema.  POSTOPERATIVE DIAGNOSES: 1. Neovascular glaucoma, out of control, right eye with severe ocular     pain. 2. Pupillary fibrin membrane after recent vitrectomy repair, retinal     detachment complex and placement of silicone oil. 3. Recurrent vitreous hemorrhage. 4. Hyphema.  PROCEDURE: 1. Posterior vitrectomy with 20-gauge with surgical removal of     pupillary fibrin membrane. 2. Insertion glaucoma Seton anterior chamber - Ahmed valve serial     [id number],  model FP7 placed in the inferotemporal quadrant, right     eye with anterior scleral tunnel into the anterior chamber. 3. Similarly insertion of glaucoma aqueous shunt - Seton - Baerveldt     nerve, model [id number],  inferonasal quadrant of the right eye and     scleral tunnel into the anterior chamber with suture ligature to     control flow. 4. Scleral patch graft to Ahmed valve tube. 5. Scleral patch graft to Baerveldt valve tube.  SURGEON:  Alford Highland. Laquana Villari, MD  ANESTHESIA:  General endotracheal anesthesia.  INDICATIONS FOR PROCEDURE:  The patient is a 25 year old woman with neglected diabetes mellitus/malignant hypertension/renal insufficiency and neglected diabetic retinopathy who attempted salvage procedure with vitrectomy which had been often delayed,which the vitrectomy was performed on 3 days previously and she  has now developed postoperative severe pain pressures of 70, vision which has declined  from head motion to now.  No light perception with the elevated intraocular pressure which does return to hand motion and light perception with anterior chamber tap.  This is an urgent and nearly emergent procedure to lower the intraocular pressure.  The patient says the risk of anesthesia including rare rest of loss to the eye including but not limited to rare occurrence of death, loss of the eye including, but not limited to hemorrhage, infection, scarring, need for another surgery, no change in vision, loss of vision, and progressive disease despite intervention.  She understands the dire nature of her underlying condition.  This is a salvage procedure and it will take meeting weeks to months to determine the success or loss of this procedure.  She understands the risk of anesthesia with the underlying hypertension, and the urgent need for surgery.  Appropriate signed consent was obtained.  The patient was taken to the operating room.  Appropriate selection site was surgically identified by the entire operating staff.  Under anesthesia, which was started without difficulty, the right periocular region was sterilely prepped and draped in the usual sterile fashion. Lid speculum  was applied.  Conjunctival peritomy was fashioned from the 10 o'clock position the right eye inferiorly and around to the 2:30 position to allow for isolation of the inferior rectus and the medial and lateral rectus muscles.  Thereafter, the Ahmed valve was selected to be placed and the tube was primed with balanced salt solution. Significant pupillary fibrin membrane was noted overlying the base of the silicone.  A 20-gauge MVR was then used to enter the anterior chamber.  This allowed for placement of a 20-gauge surgical instruments as well as a 20-gauge Lewicky anterior chamber maintainer.  This allowed removal of  pupillary fibrin membrane with forceps extraction.  Some excess oil was also removed with Lewicky anterior chamber maintainer so as to allow for an adequate meniscus of balanced salt solution in the inferior portion of the vitreous cavity once the tubes were in placed in the inferior quadrants to allow for the tubes to function properly. Surgical peripheral iridectomy was then fashioned inferiorly.  This will also allow free egress of aqueous and the fluid in the balanced phase of the vitreous cavity to access the tubes in the inferior and anterior chamber postoperatively.  At this time, the infusion was then clamped, filtered air was placed in the anterior chamber to prevent migration of the wall anteriorly.  Ahmed valve was secured in the inferotemporal quadrant and a trial baseplate with 8-0 nylon sutures.  Tube was brought forward, cut to the appropriate length.  Scleral tunnel was then fashioned in the clear cornea.  Anterior chamber was entered through this area with the MVR blade and the tube was passed.  The tube was then secured in place, a scleral patch graft Tutoplast with using vertical mattresses of 7-0 Vicryl.  This protected the tube near the limbus.  At this time, Baerveldt anterior chamber the 250 size was also selected to allow for late onset and control the intraocular pressure.  This was placed into the inferonasal quadrant after the tube had also been primed.  Suture ligature was temporarily applied to the tube to prevent egress of the fluid in a rapid fashion postoperatively.  Baseplate was secured with 8-0 nylon sutures in the appropriate quadrant, appropriate distance posterior to the insertion of the rectus muscles.  Tube was brought forward.  The scleral tunnel was passed into the clear cornea.  Anterior chamber was entered with the MVR blade.  Tube was cut to appropriate length.  Placed beveled posteriorly through the entry and the wound was ultimately closed  with a Tutoplast patch graft overlying the tube to prevent erosion.  At this time, conjunctiva and Tenon's irrigated.  The anterior chamber maintained with the tear in the anterior chamber. At this time, the conjunctiva was then closed in interrupted fashion and several Vicryl sutures to the limbus as well as close approximation from the adjacent conjunctiva.  No complications occurred.  Subconjunctival Decadron applied superiorly.  The patient tolerated the procedure well without any complication.  A sterile patch and Fox shield applied.  The patient was taken to the recovery room in good and stable addition.  She tolerated the procedure well without complication.     Alford Highland Naturi Alarid, M.D.    GAR/MEDQ  D:  06/29/2010  T:  06/29/2010  Job:  [id number]  Electronically Signed by Fawn Kirk M.D. on 09/12/2010 02:38:07 PM

## 2010-09-12 NOTE — Op Note (Signed)
[name], [name]             ACCOUNT NO.:  [id number]  MEDICAL RECORD NO.:  [id number]           PATIENT TYPE:  O  LOCATION:  SDSC                         FACILITY:  [facility]  PHYSICIAN:  Alford Highland. Criston Chancellor, M.D.   DATE OF BIRTH:  1985-07-17  DATE OF PROCEDURE:  06/25/2010 DATE OF DISCHARGE:                              OPERATIVE REPORT   PREOPERATIVE DIAGNOSES: 1. Dense vitreous hemorrhage, right eye. 2. Peripheral diabetic retinopathy, right eye. 3. Iris rubeosis. 4. Neglected diabetes mellitus and relatively treatment-naive eyes.  POSTOPERATIVE DIAGNOSES: 1. Dense vitreous hemorrhage, right eye. 2. Peripheral diabetic retinopathy, right eye. 3. Iris rubeosis. 4. Neglected diabetes mellitus and relatively treatment-naive eyes. 5. Dense cataract. 6. Severe epiretinal membrane. 7. Possible neovascular glaucoma.  PROCEDURES: 1. Posterior vitrectomy with membrane peel, right eye - 25 gauge and     23 gauge initially. 2. Endolaser panphotocoagulation, right eye. 3. Pars plana lensectomy, right eye, using 23 gauge larger     instruments. 4. Inferior peripheral iridectomy. 5. Injection of vitreous substitute - silicone oil 5000 centistokes.  SURGEON:  Alford Highland. Anaika Santillano, MD  ANESTHESIA:  General endotracheal anesthesia.  INDICATIONS FOR PROCEDURE:  The patient is a 25 year old woman with a highly advanced, progressive, what appears to be neovascular glaucoma found at the time of surgery from previous iris rubeosis.  Multiple surgeries were attempted to be scheduled in the past, which had to be cancelled because of severe malignant hypertension and had a recent hospitalization for this condition.  She likely has had some progression of her diabetic retinopathy.  In the meantime, she has opaque media which requires surgical intervention to clear the vitreous opacification as well as to deliver panphotocoagulation so as to allow for stabilization of her condition.  She  understands the risks of anesthesia including rare occurrence of death, loss of bone leading into the eye from the underlying severe nature of her condition including but not limited to hemorrhage, infection, scarring, need for another surgery, no change in vision, loss of vision, progressive disease in spite of intervention.  PROCEDURE IN DETAIL:  Appropriate signed consent was obtained.  The patient was taken to the operating room.  In the operating room, appropriate monitors were used followed by mild sedation.  General endotracheal anesthesia was induced without difficulty.  The right periocular region was identified with the entire operating staff. Thereafter, the right periocular region was prepped and draped in the usual ophthalmic fashion.  Lid speculum applied.  The 23-gauge trocars had been opened by the operating room staff.  Decision was made to go ahead and re-infuse the 23-gauge trocar system.  The inferotemporal quadrant fusion turned on.  Superior trocars were applied.  Core vitrectomy was then begun.  Dense vitreous hemorrhage was identified, removed, and peripheral vitreous was circumcised.  __________ although there was dense retinal attachments along the posterior pole and macular region.  Preretinal hemorrhage was aspirated.  Some preretinal and premacular fibrosis was dense, adherent, and seemed to be so adherent as to preclude adequate dissection because of the apparent poorly perfused retina, which made a high likelihood that the retina would simply fall apart.  For  this reason, I decided to leave a plaque of epiretinal tissue overlying the macula and the nerve.  This portion of the procedure was difficult for visualization and prior to and the mid portion of the removal of the peripheral hyaloid and also the posterior hyaloid, but due to the plaque over the macula and the optic nerve it was necessary to create a larger incision superotemporally to remove the  cataract lens.  The cataract lens was removed without difficulty.  The cataract was mostly nuclear sclerosis, but it was soft enough to be removed with the 23-gauge vitrectomy instrumentation.  The capsule was removed.  The vessel could be seen.  Notable finding was that there was significant bleeding from iris rubeosis and in fact, I believe that there was neovascular glaucoma.  For this reason, the infusion was moved into the anterior chamber with a 25-gauge infusion and this allowed for good maintenance and clearance of the anterior chamber for clarity of view posteriorly.  Fluid air exchange completed.  Under air, the peripheral retina was reattached nicely.  Endolaser photocoagulation placed at 60 degrees.  No complications occurred.  At this time, the fluid under the retinal area was re-aspirated.  The inferior peripheral iridectomy had already been fashioned for placement of silicone oil.  This deemed necessary to put the silicone oil.  Hopeful that the endolaser photocoagulation would quiet the eye and that the neovascular glaucoma itself would not need to be addressed and that the involution of the iris rubeosis would help in this manner.  At this time, superior sclerotomy was closed with 7-0 Vicryl.  The infusion was then closed with 7-0 Vicryl.  The anterior chamber infusion was then removed.  Oil had been injected into the appropriate iris plane prior to removal of the infusions.  The superotemporal sclerotomies have been closed, conjunctiva was closed with 7-0 Vicryl.  The intraocular pressure was assessed and found to be adequate.  Filtered air was left in the anterior chamber to keep the oil posteriorly.  Subconjunctival Decadron was applied.  Sterile patch and Fox shield applied.  The patient tolerated the procedure without complication, taken to the PACU.     Alford Highland Ringo Sherod, M.D.     GAR/MEDQ  D:  06/25/2010  T:  06/26/2010  Job:  [id number]  Electronically  Signed by Fawn Kirk M.D. on 09/12/2010 02:37:57 PM

## 2011-01-14 ENCOUNTER — Ambulatory Visit (HOSPITAL_COMMUNITY): Payer: Self-pay | Admitting: Oncology

## 2011-04-11 ENCOUNTER — Other Ambulatory Visit: Payer: Self-pay

## 2011-04-11 DIAGNOSIS — N19 Unspecified kidney failure: Secondary | ICD-10-CM

## 2011-04-11 DIAGNOSIS — Z01810 Encounter for preprocedural cardiovascular examination: Secondary | ICD-10-CM

## 2011-04-24 HISTORY — PX: INCISE AND DRAIN ABCESS: PRO64

## 2011-04-29 ENCOUNTER — Encounter: Payer: Self-pay | Admitting: Vascular Surgery

## 2011-04-30 ENCOUNTER — Encounter (INDEPENDENT_AMBULATORY_CARE_PROVIDER_SITE_OTHER): Payer: Medicare Other | Admitting: *Deleted

## 2011-04-30 ENCOUNTER — Encounter: Payer: Self-pay | Admitting: Vascular Surgery

## 2011-04-30 ENCOUNTER — Ambulatory Visit (INDEPENDENT_AMBULATORY_CARE_PROVIDER_SITE_OTHER): Payer: Medicare Other | Admitting: Vascular Surgery

## 2011-04-30 VITALS — BP 154/101 | HR 104 | Resp 18 | Ht 68.0 in | Wt 149.0 lb

## 2011-04-30 DIAGNOSIS — N19 Unspecified kidney failure: Secondary | ICD-10-CM

## 2011-04-30 DIAGNOSIS — N186 End stage renal disease: Secondary | ICD-10-CM

## 2011-04-30 DIAGNOSIS — Z01810 Encounter for preprocedural cardiovascular examination: Secondary | ICD-10-CM

## 2011-04-30 NOTE — Progress Notes (Signed)
Subjective:     Patient ID: [name], female   DOB: Aug 26, 1985, 26 y.o.   MRN: [id number]  HPI 26 year old blind female diabetic patient with end-stage renal disease was referred for vascular access. She has been on nondialysis for 4 months through a tunneled catheter. She was evaluated in [place] for some type of procedure which was never occurred. She is right-handed  Past Medical History  Diagnosis Date  . Diabetes mellitus     insulin dependent diabetes mellitus with history of poor control  . Hypertension   . Hyperlipidemia   . Nephrotic syndrome   . Anemia     chronic,  Dr. Mariel Sleet,  Transfused, May, 2012  . 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 Hypertrophic cardiomyopathy     with diastolic heart dysfunction  . 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 Vaginosis   . History of MRSA infection     multiple  . Glaucoma   . Warfarin anticoagulation     DVT  . Ejection fraction     55%, echo 12/2009  . Manic depressive disorder   . Noncompliance   . port-a-cath     History  Substance Use Topics  . Smoking status: Never Smoker   . Smokeless tobacco: Not on file  . Alcohol Use: No    No family history on file.  Allergies  Allergen Reactions  . Morphine And Related Itching  . Sulfa Antibiotics   . Tomato     Current outpatient prescriptions:ALPRAZolam (XANAX) 0.5 MG tablet, Take 0.5 mg by mouth 3 (three) times daily as needed.  , Disp: , Rfl: ;  amLODipine (NORVASC) 10 MG tablet, Take 10 mg by mouth daily., Disp: , Rfl: ;  insulin lispro (HUMALOG) 100 UNIT/ML injection, Inject into the skin as directed. Per sliding scale , Disp: , Rfl: ;  lisinopril (PRINIVIL,ZESTRIL) 20 MG tablet, Take 20 mg by mouth daily.  , Disp: , Rfl:  sertraline (ZOLOFT) 50 MG tablet, Take 50 mg by mouth daily.  , Disp: , Rfl: ;  brimonidine (ALPHAGAN P) 0.1 % SOLN, Place 1 drop into both eyes every 8 (eight) hours.  , Disp: ,  Rfl: ;  cloNIDine (CATAPRES) 0.3 MG tablet, Take 0.3 mg by mouth 2 (two) times daily.  , Disp: , Rfl: ;  furosemide (LASIX) 40 MG tablet, Take 40 mg by mouth daily.  , Disp: , Rfl: ;  gatifloxacin (ZYMAXID) 0.5 % SOLN, Place 1 drop into both eyes.  , Disp: , Rfl:  insulin glargine (LANTUS) 100 UNIT/ML injection, Inject 36 Units into the skin 2 (two) times daily.  , Disp: , Rfl: ;  labetalol (NORMODYNE) 300 MG tablet, Take 300 mg by mouth 2 (two) times daily.  , Disp: , Rfl: ;  oxyCODONE-acetaminophen (PERCOCET) 5-325 MG per tablet, Take 1 tablet by mouth every 4 (four) hours as needed.  , Disp: , Rfl: ;  pantoprazole (PROTONIX) 40 MG tablet, Take 40 mg by mouth daily.  , Disp: , Rfl:  prednisoLONE acetate (PRED FORTE) 1 % ophthalmic suspension, Place 1 drop into both eyes 4 (four) times daily.  , Disp: , Rfl: ;  scopolamine (HYOSCINE) 0.25 % ophthalmic solution, Place 1 drop into both eyes once.  , Disp: , Rfl: ;  timolol (BETIMOL) 0.25 % ophthalmic solution, Place 1-2 drops into both eyes 2 (two) times daily.  , Disp: , Rfl:  BP 154/101  Pulse 104  Resp 18  Ht 5\' 8"  (1.727 m)  Wt 149 lb (67.586 kg)  BMI 22.66 kg/m2  SpO2 100%  Body mass index is 22.66 kg/(m^2).         Review of Systems denies chest pain, dyspnea on exertion, PND, orthopnea. Does have occasional rapid heart rate.    Objective:   Physical Exam blood pressure [phone] heart rate 101 respirations 18 Generally she is a blind female who is in no apparent distress alert and oriented x3 Lungs no rhonchi or wheezing Cardiovascular regular and no murmurs Abdomen soft nontender no masses Upper extremity exam reveals 3+ brachial and radial pulses bilaterally. No surface veins are visible.  Today I ordered bilateral vein mapping. Results did not reveal any definitely adequate size veins. Cephalic veins are inadequate in both upper extremities. Basilic vein is slightly larger on the right than the left. I visualized this  independently with the SonoSite ultrasound machine. I think it would be worthwhile to attempt a right basilic vein transposition in this 26 year old female    Assessment:     End-stage renal disease needs permanent access-very poor veins    Plan:     We'll schedule for attempt at basilic vein transposition either one or two-stage by Dr. Standley Brooking on Tuesday, April 9 in right upper extremity which is the largest vein available Discussed with patient that this may not be successful

## 2011-05-02 ENCOUNTER — Other Ambulatory Visit: Payer: Self-pay

## 2011-05-03 ENCOUNTER — Encounter (HOSPITAL_COMMUNITY): Payer: Self-pay | Admitting: Pharmacy Technician

## 2011-05-07 ENCOUNTER — Encounter (HOSPITAL_COMMUNITY): Payer: Self-pay | Admitting: *Deleted

## 2011-05-07 NOTE — Procedures (Unsigned)
CEPHALIC VEIN MAPPING  INDICATION:  End-stage renal disease.  HISTORY: Preop for AV fistula.  EXAM: The right cephalic vein is compressible.  Diameter measurements range from 0.11 to 0.14 cm.  The right basilic vein is compressible.  Diameter measurements range from 0.14 to 0.21 cm.  The left cephalic vein is compressible.  Diameter measurements range from 0.07 to 0.19 cm.  The left basilic vein is compressible.  Diameter measurements range from 0.15 to 0.19 cm.  See attached worksheet for all measurements.  IMPRESSION:  Patent bilateral cephalic and basilic veins with diameter measurements as described above.  ___________________________________________ Quita Skye. Hart Rochester, M.D.  SS/MEDQ  D:  04/30/2011  T:  04/30/2011  Job:  [id number]

## 2011-05-07 NOTE — Progress Notes (Addendum)
Pt was contacted for health hx via phone, states she had some tests done, possibly EKG or CXR, while hospitalized at [facility] in [place], [place]. Called their medical records dept and they need medical release form in order to send records. Pt not available to sign form.

## 2011-05-13 MED ORDER — CEFAZOLIN SODIUM 1-5 GM-% IV SOLN
1.0000 g | Freq: Once | INTRAVENOUS | Status: AC
  Administered 2011-05-14: 750 mg via INTRAVENOUS
  Filled 2011-05-13: qty 50

## 2011-05-14 ENCOUNTER — Encounter (HOSPITAL_COMMUNITY): Payer: Self-pay | Admitting: Anesthesiology

## 2011-05-14 ENCOUNTER — Ambulatory Visit (HOSPITAL_COMMUNITY)
Admission: RE | Admit: 2011-05-14 | Discharge: 2011-05-14 | Disposition: A | Discharge status: Home / Self Care | Payer: Medicaid Other | Source: Ambulatory Visit | Attending: Vascular Surgery | Admitting: Vascular Surgery

## 2011-05-14 ENCOUNTER — Ambulatory Visit (HOSPITAL_COMMUNITY): Payer: Medicaid Other | Admitting: Anesthesiology

## 2011-05-14 ENCOUNTER — Encounter (HOSPITAL_COMMUNITY)
Admission: RE | Disposition: A | Discharge status: Home / Self Care | Payer: Self-pay | Source: Ambulatory Visit | Attending: Vascular Surgery

## 2011-05-14 ENCOUNTER — Ambulatory Visit (HOSPITAL_COMMUNITY): Payer: Medicaid Other

## 2011-05-14 DIAGNOSIS — E119 Type 2 diabetes mellitus without complications: Secondary | ICD-10-CM | POA: Insufficient documentation

## 2011-05-14 DIAGNOSIS — D631 Anemia in chronic kidney disease: Secondary | ICD-10-CM | POA: Insufficient documentation

## 2011-05-14 DIAGNOSIS — I120 Hypertensive chronic kidney disease with stage 5 chronic kidney disease or end stage renal disease: Secondary | ICD-10-CM | POA: Insufficient documentation

## 2011-05-14 DIAGNOSIS — F319 Bipolar disorder, unspecified: Secondary | ICD-10-CM | POA: Insufficient documentation

## 2011-05-14 DIAGNOSIS — N186 End stage renal disease: Secondary | ICD-10-CM

## 2011-05-14 DIAGNOSIS — H548 Legal blindness, as defined in USA: Secondary | ICD-10-CM | POA: Insufficient documentation

## 2011-05-14 DIAGNOSIS — E785 Hyperlipidemia, unspecified: Secondary | ICD-10-CM | POA: Insufficient documentation

## 2011-05-14 DIAGNOSIS — N039 Chronic nephritic syndrome with unspecified morphologic changes: Secondary | ICD-10-CM | POA: Insufficient documentation

## 2011-05-14 HISTORY — DX: Dependence on renal dialysis: Z99.2

## 2011-05-14 LAB — GLUCOSE, CAPILLARY: Glucose-Capillary: 127 mg/dL — ABNORMAL HIGH (ref 70–99)

## 2011-05-14 SURGERY — TRANSPOSITION, VEIN, BASILIC
Anesthesia: General | Site: Arm Upper | Laterality: Right | Wound class: Clean

## 2011-05-14 MED ORDER — EPHEDRINE SULFATE 50 MG/ML IJ SOLN
INTRAMUSCULAR | Status: DC | PRN
  Administered 2011-05-14 (×2): 5 mg via INTRAVENOUS
  Administered 2011-05-14: 10 mg via INTRAVENOUS
  Administered 2011-05-14: 5 mg via INTRAVENOUS
  Administered 2011-05-14: 10 mg via INTRAVENOUS
  Administered 2011-05-14: 5 mg via INTRAVENOUS

## 2011-05-14 MED ORDER — PROMETHAZINE HCL 25 MG/ML IJ SOLN: 6.2500 mg | INTRAMUSCULAR | Status: DC | PRN

## 2011-05-14 MED ORDER — FENTANYL CITRATE 0.05 MG/ML IJ SOLN
INTRAMUSCULAR | Status: DC | PRN
  Administered 2011-05-14 (×3): 25 ug via INTRAVENOUS
  Administered 2011-05-14: 100 ug via INTRAVENOUS

## 2011-05-14 MED ORDER — 0.9 % SODIUM CHLORIDE (POUR BTL) OPTIME
TOPICAL | Status: DC | PRN
  Administered 2011-05-14: 1000 mL

## 2011-05-14 MED ORDER — HYDROMORPHONE HCL PF 1 MG/ML IJ SOLN
INTRAMUSCULAR | Status: AC
  Filled 2011-05-14: qty 1

## 2011-05-14 MED ORDER — MIDAZOLAM HCL 5 MG/5ML IJ SOLN
INTRAMUSCULAR | Status: DC | PRN
  Administered 2011-05-14: 2 mg via INTRAVENOUS

## 2011-05-14 MED ORDER — PROPOFOL 10 MG/ML IV EMUL
INTRAVENOUS | Status: DC | PRN
  Administered 2011-05-14: 150 mg via INTRAVENOUS
  Administered 2011-05-14: 50 mg via INTRAVENOUS

## 2011-05-14 MED ORDER — HYDROMORPHONE HCL PF 1 MG/ML IJ SOLN
0.2500 mg | INTRAMUSCULAR | Status: DC | PRN
  Administered 2011-05-14 (×4): 0.5 mg via INTRAVENOUS

## 2011-05-14 MED ORDER — MUPIROCIN 2 % EX OINT
TOPICAL_OINTMENT | CUTANEOUS | Status: AC
  Administered 2011-05-14: 1 via NASAL
  Filled 2011-05-14: qty 22

## 2011-05-14 MED ORDER — SODIUM CHLORIDE 0.9 % IV SOLN
INTRAVENOUS | Status: DC | PRN
  Administered 2011-05-14 (×2): via INTRAVENOUS

## 2011-05-14 MED ORDER — ONDANSETRON HCL 4 MG/2ML IJ SOLN
INTRAMUSCULAR | Status: DC | PRN
  Administered 2011-05-14: 4 mg via INTRAVENOUS

## 2011-05-14 MED ORDER — PHENYLEPHRINE HCL 10 MG/ML IJ SOLN
10.0000 mg | INTRAVENOUS | Status: DC | PRN
  Administered 2011-05-14: 15 ug/min via INTRAVENOUS

## 2011-05-14 MED ORDER — SODIUM CHLORIDE 0.9 % IR SOLN
Status: DC | PRN
  Administered 2011-05-14: 11:00:00

## 2011-05-14 MED ORDER — TRAMADOL HCL 50 MG PO TABS: 50.0000 mg | ORAL_TABLET | Freq: Four times a day (QID) | ORAL | Status: DC | PRN

## 2011-05-14 SURGICAL SUPPLY — 35 items
CANISTER SUCTION 2500CC (MISCELLANEOUS) ×2 IMPLANT
CLIP TI MEDIUM 6 (CLIP) ×2 IMPLANT
CLIP TI WIDE RED SMALL 6 (CLIP) ×2 IMPLANT
CLOTH BEACON ORANGE TIMEOUT ST (SAFETY) ×2 IMPLANT
COVER PROBE W GEL 5X96 (DRAPES) IMPLANT
COVER SURGICAL LIGHT HANDLE (MISCELLANEOUS) ×4 IMPLANT
DECANTER SPIKE VIAL GLASS SM (MISCELLANEOUS) ×2 IMPLANT
DERMABOND ADVANCED (GAUZE/BANDAGES/DRESSINGS) ×1
DERMABOND ADVANCED .7 DNX12 (GAUZE/BANDAGES/DRESSINGS) ×1 IMPLANT
DRAIN PENROSE 1/2X12 LTX STRL (WOUND CARE) IMPLANT
ELECT REM PT RETURN 9FT ADLT (ELECTROSURGICAL) ×2
ELECTRODE REM PT RTRN 9FT ADLT (ELECTROSURGICAL) ×1 IMPLANT
GLOVE BIO SURGEON STRL SZ 6.5 (GLOVE) ×4 IMPLANT
GLOVE BIO SURGEON STRL SZ7 (GLOVE) ×6 IMPLANT
GLOVE BIOGEL PI IND STRL 6.5 (GLOVE) ×4 IMPLANT
GLOVE BIOGEL PI IND STRL 7.5 (GLOVE) ×1 IMPLANT
GLOVE BIOGEL PI INDICATOR 6.5 (GLOVE) ×4
GLOVE BIOGEL PI INDICATOR 7.5 (GLOVE) ×1
GOWN PREVENTION PLUS XXLARGE (GOWN DISPOSABLE) ×4 IMPLANT
GOWN STRL NON-REIN LRG LVL3 (GOWN DISPOSABLE) ×4 IMPLANT
KIT BASIN OR (CUSTOM PROCEDURE TRAY) ×2 IMPLANT
KIT ROOM TURNOVER OR (KITS) ×2 IMPLANT
NS IRRIG 1000ML POUR BTL (IV SOLUTION) ×2 IMPLANT
PACK CV ACCESS (CUSTOM PROCEDURE TRAY) ×2 IMPLANT
PAD ARMBOARD 7.5X6 YLW CONV (MISCELLANEOUS) ×4 IMPLANT
SPONGE SURGIFOAM ABS GEL 100 (HEMOSTASIS) IMPLANT
SUT MNCRL AB 4-0 PS2 18 (SUTURE) ×2 IMPLANT
SUT PROLENE 6 0 BV (SUTURE) ×4 IMPLANT
SUT PROLENE 7 0 BV 1 (SUTURE) ×4 IMPLANT
SUT VIC AB 3-0 SH 27 (SUTURE) ×1
SUT VIC AB 3-0 SH 27X BRD (SUTURE) ×1 IMPLANT
TOWEL OR 17X24 6PK STRL BLUE (TOWEL DISPOSABLE) ×2 IMPLANT
TOWEL OR 17X26 10 PK STRL BLUE (TOWEL DISPOSABLE) ×2 IMPLANT
UNDERPAD 30X30 INCONTINENT (UNDERPADS AND DIAPERS) ×2 IMPLANT
WATER STERILE IRR 1000ML POUR (IV SOLUTION) ×2 IMPLANT

## 2011-05-14 NOTE — Discharge Instructions (Signed)
Arteriovenous (AV) Access for Hemodialysis An arteriovenous (AV) access is a surgically created or placed tube that allows for repeated access to the blood in your body. This access is required for hemodialysis, a type of dialysis. Dialysis is a treatment process that filters and cleans the blood in order to eliminate toxic wastes from the body when the kidneys fail to do this on their own. There are several different access methods used for hemodialysis. ACCESS METHODS  Double lumen catheter. A flexible tube with 2 channels may be used on a temporary or long-term basis. A long-term use catheter often has a cuff that holds it in place. The catheter is surgically placed and tunneled under the skin. This catheter is often placed when dialysis is needed in an emergency, such as when the kidneys suddenly stop working. It may also be needed when a permanent AV access fails or has not yet been placed. A catheter is usually placed into one of the following:   Large vein under your collarbone (subclavian vein).   Large vein in your neck (jugular vein).   Temporarily, in the large vein in your groin (femoral vein).   AV graft. A man-made (synthetic) material may be used to connect an artery and vein in the arm or thigh. This graft takes around 2 weeks to develop (mature) and is often placed a few weeks prior to use. A graft can generally last from 1 to 2 years.   AV fistula. A minor surgical procedure may be done to connect an artery and vein, creating a fistula. This causes arterial blood to flow directly into a vein. The vein gets larger, allowing easier access for dialysis. The fistula takes around 12 weeks to mature and must be placed several months before dialysis is anticipated. A fistula provides the best access for hemodialysis and can last for several years.  It is critical that veins in patients at high risk to develop kidney failure are preserved. This may include avoiding blood pressure checks and  intravenous (IV) or lab draws from the arm. This maximizes the chance for creating a functioning AV access when needed. Although a fistula is the most desirable access to use for hemodialysis, it may not be possible. If the veins are not large enough or there is no time to wait for a fistula to mature, a graft or catheter may be used. RISKS AND COMPLICATIONS   Double lumen catheter. A catheter may develop serious infections. It may also develop a clot (thrombosis) and fail. A catheter can cause clots in the vein in which it is placed. A subclavian vein catheter is the most likely to cause thrombosis. When the subclavian vein clots, it makes it very difficult for the patient to sustain an AV graft or fistula. When possible, catheters should be avoided and a more permanent AV access should be placed.   AV graft. A graft may swell after surgery, but this should decrease as it heals. A graft may stop working properly due to thrombosis or the diameter of the tube getting smaller. The tube can eventually become blocked (stenosis). If a partial blockage is found relatively early, it can be treated. Left untreated, the stenosis will progress until the vessel is completely blocked. Infection may also occur.   AV fistula. Infection and thrombosis are the biggest risks with a fistula. However, the rates for infection and stenosis are lower with fistulas than the other 2 methods. Frequent thrombosis may require creating a backup fistula at another site.   This will allow for dialysis when one access is blocked.  HOME CARE INSTRUCTIONS   Keep the site of the cut (incision) clean and dry while it heals. This helps prevent infection.   If you have a catheter, do not shower while the incision is healing. After it is healed, ask your caregiver for recommendations on showering. The bandage (dressing) will be changed at the dialysis center. Do not remove this dressing. However, if it becomes wet or loose, a sterile gauze  dressing may be placed over the site and secured by placing adhesive tape on the edges.   If you have a graft or fistula, clean the site daily until it heals completely. Stitches will be removed, usually after about 10 to 14 days. Once the access is being used for dialysis, a small dressing will be placed over the needle sites after the treatment is done. Keep this dressing on for at least 12 hours. Keep your arm or thigh clean and dry during this time.   A small amount of bleeding is normal, especially if the access is new. If you have a large amount of bleeding and cannot stop it, this is not normal. Call your caregiver right away. You will be taught how to hold your graft or fistula sites to stop the bleeding.  If you have a graft or fistula:  A "bruit" is a noise that is heard with a stethoscope and a "thrill" is a vibration felt over the graft or fistula. The presence of the bruit and thrill indicate that the access is working. You will be taught to feel for the thrill each day. If this is not felt, the access may be clotted. Call your caregiver.   You may use the arm freely after the site heals. Keep the following in mind:   Avoid pressure on the arm.   Avoid lifting heavy objects with the arm.   Avoid sleeping on the arm with the graft or fistula.   Avoid wearing tight-sleeved shirts or jewelry around the graft or fistula.   Do not allow blood pressure monitoring or needle punctures on the side where the graft or fistula is located.   With permission from your caregiver, you may do exercises to help with blood flow through a fistula. These exercises involve squeezing a rubber ball or other soft objects as instructed.  SEEK MEDICAL CARE IF:   Chills develop.   You have an oral temperature above 102 F (38.9 C).   Swelling around the graft or fistula gets worse.   New pain develops.   Unusual bleeding develops.   Pus or other fluid (drainage) is seen at the AV access site.    Skin redness or red streaking is seen on the skin around, above, or below the AV access.  SEEK IMMEDIATE MEDICAL CARE IF:   Pain, numbness, or an unusual pale skin color develops in the hand on the side of your fistula.   Dizziness or weakness develops that you have not had before.   Shortness of breath develops.   Chest pain develops.   The AV access has bleeding that cannot be easily controlled.  Wear a medical alert bracelet to let caregivers know you are a dialysis patient, so they can care for your veins appropriately. Document Released: 04/13/2002 Document Revised: 01/10/2011 Document Reviewed: 06/20/2009 Exit[employer] Patient Information 2012 [employer]. 

## 2011-05-14 NOTE — Anesthesia Preprocedure Evaluation (Signed)
Anesthesia Evaluation  Patient identified by MRN, date of birth, ID band Patient awake    Reviewed: Allergy & Precautions, H&P , NPO status   History of Anesthesia Complications (+) DIFFICULT IV STICK / SPECIAL LINE  Airway Mallampati: I  Neck ROM: Full    Dental  (+) Teeth Intact   Pulmonary neg pulmonary ROS,  breath sounds clear to auscultation        Cardiovascular hypertension, Rhythm:Regular Rate:Normal + Systolic murmurs    Neuro/Psych PSYCHIATRIC DISORDERS    GI/Hepatic negative GI ROS, Neg liver ROS,   Endo/Other  Diabetes mellitus-  Renal/GU      Musculoskeletal   Abdominal   Peds  Hematology  (+) Blood dyscrasia, ,   Anesthesia Other Findings   Reproductive/Obstetrics                           Anesthesia Physical Anesthesia Plan  ASA: III  Anesthesia Plan: General   Post-op Pain Management:    Induction: Intravenous  Airway Management Planned: LMA  Additional Equipment:   Intra-op Plan:   Post-operative Plan: Extubation in OR  Informed Consent:   Dental advisory given  Plan Discussed with: CRNA and Surgeon  Anesthesia Plan Comments:         Anesthesia Quick Evaluation

## 2011-05-14 NOTE — Anesthesia Postprocedure Evaluation (Signed)
  Anesthesia Post-op Note  Patient: [name]  Procedure(s) Performed: Procedure(s) (LRB): BASCILIC VEIN TRANSPOSITION (Right)  Patient Location: PACU  Anesthesia Type: General  Level of Consciousness: awake  Airway and Oxygen Therapy: Patient Spontanous Breathing  Post-op Pain: mild  Post-op Assessment: Post-op Vital signs reviewed  Post-op Vital Signs: stable  Complications: No apparent anesthesia complications

## 2011-05-14 NOTE — Op Note (Signed)
OPERATIVE NOTE   PROCEDURE: 1. right first stage basilic vein transposition (brachiobasilic arteriovenous fistula) placement  PRE-OPERATIVE DIAGNOSIS: end stage renal disease   POST-OPERATIVE DIAGNOSIS: same as above   SURGEON: Leonides Sake, MD  ANESTHESIA: general  ESTIMATED BLOOD LOSS: 30 cc  FINDING(S): 1. Small brachial artery (?early arterial bifurcation): 2.5 mm 2. Small basilic vein: 3 mm 3. Palpable pulse in basilic vein transposition at end of case 4. Radial signal at end of case: no augmentation with venous outflow compression, suggesting ulnar dominance  SPECIMEN(S):  none  INDICATIONS:   [name] is a 26 y.o. female who presents with end stage renal disease.  The patient is scheduled for right first stage basilic vein transposition.  The patient is aware the risks include but are not limited to: bleeding, infection, steal syndrome, nerve damage, ischemic monomelic neuropathy, failure to mature, and need for additional procedures.  The patient is aware of the risks of the procedure and elects to proceed forward.  DESCRIPTION: After full informed written consent was obtained from the patient, the patient was brought back to the operating room and placed supine upon the operating table.  Prior to induction, the patient received IV antibiotics.   Shortly after getting the antibiotics, she began wheezing.  The antibiotics were discontinued.  Anesthesia elected to observe her respiratory status while intubated.   By the end of the case, this wheezing resolved without any intervention.  After obtaining adequate anesthesia, the patient was then prepped and draped in the standard fashion for a right arm access procedure.  I turned my attention first to identifying the patient's basilic vein and brachial artery.  Using SonoSite guidance, the location of these vessels were marked out on the skin.   At this point, I injected local anesthetic to obtain a field block of the  antecubitum.  I made a transverse incision at the level of the antecubitum and dissected through the subcutaneous tissue and fascia to gain exposure of the brachial artery.  This was noted to be 2.5 mm in diameter externally with some evidence of atherosclerosis.  This was dissected out proximally and distally and controlled with vessel loops .  I then dissected out first a cubital vein, but upon interrogation with dilators, I did not think it was adequate so I tied it off.  I then dissected out the basilic vein.  This was noted to be only 3 mm in diameter externally.  The distal segment of the vein was ligated with a  2-0 silk, and the vein was transected.  The proximal segment was iinterrogated with serial dilators.  The vein accepted up to a 3.5 mm dilator without any difficulty.  I then instilled the heparinized saline into the vein and clamped it.  At this point, I reset my exposure of the brachial artery and placed the artery under tension proximally and distally.  I made an arteriotomy with a #11 blade, and then I extended the arteriotomy with a Potts scissor.  I injected heparinized saline proximal and distal to this arteriotomy.  The vein was then sewn to the artery in an end-to-side configuration with a running stitch of 7-0 Prolene.  Prior to completing this anastomosis, I allowed the vein and artery to backbleed.  There was no evidence of clot from any vessels.  I completed the anastomosis in the usual fashion and then released all vessel loops and clamps.  There was no palpable thrill but there was a palpable pulse in the venous  outflow, and there was no palpable radial pulse but a dopplerable monophasic radial signal, which did not augment with venous outflow compression.  At this point, I irrigated out the surgical wound.  There was no further active bleeding.  The subcutaneous tissue was reapproximated with a running stitch of 3-0 Vicryl.  The skin was then reapproximated with a running subcuticular  stitch of 4-0 Vicryl.  The skin was then cleaned, dried, and reinforced with Dermabond.  The patient tolerated this procedure well without any further airway or breathing problems.  By the end of the case, the patient was breathing normally without any further breathing issues.  COMPLICATIONS: possible self limited allergic reaction to antibiotics  CONDITION: stable  Leonides Sake, MD [facility] Office: [phone] Pager: [phone]  05/14/2011, 11:50 AM

## 2011-05-14 NOTE — Progress Notes (Signed)
Dr. Nicky Pugh PA Rene Kocher here to listen to Patients lungs.  No unusual findings.  Called and faxed arm graph to [facility] dialysis center.  Patient is to return to dialysis tomorrow, at usual time for dialysis.

## 2011-05-14 NOTE — Progress Notes (Addendum)
Pt. Reports that she took 1/2 Lantus dose last p.m. Pt. Reports she removed Clonidine patch yesterday, does not have a new one to place on herself.

## 2011-05-14 NOTE — Progress Notes (Signed)
List of meds rec'd from [facility]. List is not including several meds. that pt. is reporting.  Pt. Has only been with [name] since early March 2013, prior to this pt. was in [place], but states "it was just for a visit". Pt. Lived here prior to that but its unclear where she rec'd care. Pt. Sees Dr. [name] in [place].

## 2011-05-14 NOTE — Progress Notes (Signed)
Post -op patient breathing without difficulty. Lungs auscultated clear no wheezing. HR 95 BP 118/76  Stable for D/C home

## 2011-05-14 NOTE — Progress Notes (Signed)
Spoke Andree Elk at [facility]. She faxed copy of med. List to our pharm. Dept.

## 2011-05-14 NOTE — Preoperative (Signed)
Beta Blockers   Reason not to administer Beta Blockers:Not Applicable 

## 2011-05-14 NOTE — Progress Notes (Signed)
Pt. Is not a good historian for meds. Call to Pharm. Tech., Toni Amend, she will try to contact pt.'s pharmacy.

## 2011-05-14 NOTE — H&P (View-Only) (Signed)
Subjective:     Patient ID: [name], female   DOB: 01-13-1986, 26 y.o.   MRN: [id number]  HPI 26 year old blind female diabetic patient with end-stage renal disease was referred for vascular access. She has been on nondialysis for 4 months through a tunneled catheter. She was evaluated in [place] for some type of procedure which was never occurred. She is right-handed  Past Medical History  Diagnosis Date  . Diabetes mellitus     insulin dependent diabetes mellitus with history of poor control  . Hypertension   . Hyperlipidemia   . Nephrotic syndrome   . Anemia     chronic,  Dr. Mariel Sleet,  Transfused, May, 2012  . 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 Hypertrophic cardiomyopathy     with diastolic heart dysfunction  . 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 Vaginosis   . History of MRSA infection     multiple  . Glaucoma   . Warfarin anticoagulation     DVT  . Ejection fraction     55%, echo 12/2009  . Manic depressive disorder   . Noncompliance   . port-a-cath     History  Substance Use Topics  . Smoking status: Never Smoker   . Smokeless tobacco: Not on file  . Alcohol Use: No    No family history on file.  Allergies  Allergen Reactions  . Morphine And Related Itching  . Sulfa Antibiotics   . Tomato     Current outpatient prescriptions:ALPRAZolam (XANAX) 0.5 MG tablet, Take 0.5 mg by mouth 3 (three) times daily as needed.  , Disp: , Rfl: ;  amLODipine (NORVASC) 10 MG tablet, Take 10 mg by mouth daily., Disp: , Rfl: ;  insulin lispro (HUMALOG) 100 UNIT/ML injection, Inject into the skin as directed. Per sliding scale , Disp: , Rfl: ;  lisinopril (PRINIVIL,ZESTRIL) 20 MG tablet, Take 20 mg by mouth daily.  , Disp: , Rfl:  sertraline (ZOLOFT) 50 MG tablet, Take 50 mg by mouth daily.  , Disp: , Rfl: ;  brimonidine (ALPHAGAN P) 0.1 % SOLN, Place 1 drop into both eyes every 8 (eight) hours.  , Disp: ,  Rfl: ;  cloNIDine (CATAPRES) 0.3 MG tablet, Take 0.3 mg by mouth 2 (two) times daily.  , Disp: , Rfl: ;  furosemide (LASIX) 40 MG tablet, Take 40 mg by mouth daily.  , Disp: , Rfl: ;  gatifloxacin (ZYMAXID) 0.5 % SOLN, Place 1 drop into both eyes.  , Disp: , Rfl:  insulin glargine (LANTUS) 100 UNIT/ML injection, Inject 36 Units into the skin 2 (two) times daily.  , Disp: , Rfl: ;  labetalol (NORMODYNE) 300 MG tablet, Take 300 mg by mouth 2 (two) times daily.  , Disp: , Rfl: ;  oxyCODONE-acetaminophen (PERCOCET) 5-325 MG per tablet, Take 1 tablet by mouth every 4 (four) hours as needed.  , Disp: , Rfl: ;  pantoprazole (PROTONIX) 40 MG tablet, Take 40 mg by mouth daily.  , Disp: , Rfl:  prednisoLONE acetate (PRED FORTE) 1 % ophthalmic suspension, Place 1 drop into both eyes 4 (four) times daily.  , Disp: , Rfl: ;  scopolamine (HYOSCINE) 0.25 % ophthalmic solution, Place 1 drop into both eyes once.  , Disp: , Rfl: ;  timolol (BETIMOL) 0.25 % ophthalmic solution, Place 1-2 drops into both eyes 2 (two) times daily.  , Disp: , Rfl:  BP 154/101  Pulse 104  Resp 18  Ht 5\' 8"  (1.727 m)  Wt 149 lb (67.586 kg)  BMI 22.66 kg/m2  SpO2 100%  Body mass index is 22.66 kg/(m^2).         Review of Systems denies chest pain, dyspnea on exertion, PND, orthopnea. Does have occasional rapid heart rate.    Objective:   Physical Exam blood pressure [phone] heart rate 101 respirations 18 Generally she is a blind female who is in no apparent distress alert and oriented x3 Lungs no rhonchi or wheezing Cardiovascular regular and no murmurs Abdomen soft nontender no masses Upper extremity exam reveals 3+ brachial and radial pulses bilaterally. No surface veins are visible.  Today I ordered bilateral vein mapping. Results did not reveal any definitely adequate size veins. Cephalic veins are inadequate in both upper extremities. Basilic vein is slightly larger on the right than the left. I visualized this  independently with the SonoSite ultrasound machine. I think it would be worthwhile to attempt a right basilic vein transposition in this 26 year old female    Assessment:     End-stage renal disease needs permanent access-very poor veins    Plan:     We'll schedule for attempt at basilic vein transposition either one or two-stage by Dr. Standley Brooking on Tuesday, April 9 in right upper extremity which is the largest vein available Discussed with patient that this may not be successful

## 2011-05-14 NOTE — Progress Notes (Signed)
Pt meets discharge criteria waiting on patient's boyfriend to return with clothes and provide patient transportation home.

## 2011-05-14 NOTE — Transfer of Care (Signed)
Immediate Anesthesia Transfer of Care Note  Patient: [name]  Procedure(s) Performed: Procedure(s) (LRB): BASCILIC VEIN TRANSPOSITION (Right)  Patient Location: PACU  Anesthesia Type: General  Level of Consciousness: awake and alert   Airway & Oxygen Therapy: Patient Spontanous Breathing and Patient connected to face mask oxygen  Post-op Assessment: Report given to PACU RN and Post -op Vital signs reviewed and stable  Post vital signs: Reviewed and stable  Complications: No apparent anesthesia complications

## 2011-05-14 NOTE — Interval H&P Note (Signed)
[facility]  History and Physical Update  The patient was interviewed and re-examined.  The patient's previous History and Physical has been reviewed and is unchanged.  There is no change in the plan of care.  Leonides Sake, MD [facility] Office: [phone] Pager: [phone]  05/14/2011, 7:11 AM

## 2011-05-15 ENCOUNTER — Telehealth: Payer: Self-pay | Admitting: *Deleted

## 2011-05-15 LAB — POCT I-STAT 4, (NA,K, GLUC, HGB,HCT)
Glucose, Bld: 196 mg/dL — ABNORMAL HIGH (ref 70–99)
HCT: 31 % — ABNORMAL LOW (ref 36.0–46.0)

## 2011-05-15 NOTE — Telephone Encounter (Signed)
[name], pharmacist with [name] in [facility] called stating patient had dropped prescription off for Ultram written by Dr Leonides Sake on 05/14/11. He was alerting [place] that patient had Vicodin 7.5/650 # 90 filled on 04/29/11 written by Dr. Flossie Dibble from [place]. I told [name] to not to fill prescription that Dr Imogene Burn was not aware of additional pain medication having been dispensed.

## 2011-05-15 NOTE — Progress Notes (Signed)
Left message for pt at home number ... To call if I can be of help .Marland KitchenMarland Kitchen

## 2011-05-16 ENCOUNTER — Telehealth: Payer: Self-pay

## 2011-05-16 NOTE — Telephone Encounter (Signed)
Pt. called office yesterday (4/10) regarding needing to get another prescription called to a "different pharmacy", as [facility] in [place] "shredded" the prescription for Tramadol, due to recent prescription filled for Hydrocodone.  Discussed this w/ pt., and told her that the pharmacy notified us of the RX of Hydrocodone/Acetaminophen 7.5/650 mg  w/ qty # 90 filled 04/29/11, and they questioned the prescription for Tramadol.  Pt. Stated the [facility] pharmacy in [place] stated she couldn't get any more pain medication until the end of the month. Pt. stated that "I am visually impaired, and my boyfriend picked up my prescription, and said there wasn't 90 tablets of Hydrocodone, and that only 60 tabs were filled."  Discussed w/ Dr. Imogene Burn and made him aware of situation.  Stated that the situation for the pain medication refill is between the pharmacy and the patient.  Attempted to call the pt. And inform of Dr. Nicky Pugh response; left voice message.

## 2011-05-22 ENCOUNTER — Encounter (HOSPITAL_COMMUNITY): Payer: Self-pay | Admitting: Emergency Medicine

## 2011-05-22 ENCOUNTER — Emergency Department (HOSPITAL_COMMUNITY)
Admission: EM | Admit: 2011-05-22 | Discharge: 2011-05-22 | Disposition: A | Discharge status: Home / Self Care | Payer: Medicaid Other | Attending: Emergency Medicine | Admitting: Emergency Medicine

## 2011-05-22 DIAGNOSIS — Z7901 Long term (current) use of anticoagulants: Secondary | ICD-10-CM | POA: Insufficient documentation

## 2011-05-22 DIAGNOSIS — Z992 Dependence on renal dialysis: Secondary | ICD-10-CM | POA: Insufficient documentation

## 2011-05-22 DIAGNOSIS — H409 Unspecified glaucoma: Secondary | ICD-10-CM | POA: Insufficient documentation

## 2011-05-22 DIAGNOSIS — E785 Hyperlipidemia, unspecified: Secondary | ICD-10-CM | POA: Insufficient documentation

## 2011-05-22 DIAGNOSIS — Z794 Long term (current) use of insulin: Secondary | ICD-10-CM | POA: Insufficient documentation

## 2011-05-22 DIAGNOSIS — M79609 Pain in unspecified limb: Secondary | ICD-10-CM | POA: Insufficient documentation

## 2011-05-22 DIAGNOSIS — H548 Legal blindness, as defined in USA: Secondary | ICD-10-CM | POA: Insufficient documentation

## 2011-05-22 DIAGNOSIS — D638 Anemia in other chronic diseases classified elsewhere: Secondary | ICD-10-CM | POA: Insufficient documentation

## 2011-05-22 DIAGNOSIS — M79603 Pain in arm, unspecified: Secondary | ICD-10-CM

## 2011-05-22 DIAGNOSIS — N186 End stage renal disease: Secondary | ICD-10-CM | POA: Insufficient documentation

## 2011-05-22 DIAGNOSIS — I422 Other hypertrophic cardiomyopathy: Secondary | ICD-10-CM | POA: Insufficient documentation

## 2011-05-22 DIAGNOSIS — Z86718 Personal history of other venous thrombosis and embolism: Secondary | ICD-10-CM | POA: Insufficient documentation

## 2011-05-22 DIAGNOSIS — I120 Hypertensive chronic kidney disease with stage 5 chronic kidney disease or end stage renal disease: Secondary | ICD-10-CM | POA: Insufficient documentation

## 2011-05-22 DIAGNOSIS — Z79899 Other long term (current) drug therapy: Secondary | ICD-10-CM | POA: Insufficient documentation

## 2011-05-22 MED ORDER — HYDROCODONE-ACETAMINOPHEN 5-325 MG PO TABS: 1.0000 | ORAL_TABLET | ORAL | Status: DC | PRN

## 2011-05-22 MED ORDER — HYDROCODONE-ACETAMINOPHEN 5-325 MG PO TABS
1.0000 | ORAL_TABLET | Freq: Once | ORAL | Status: AC
  Administered 2011-05-22: 1 via ORAL
  Filled 2011-05-22: qty 1

## 2011-05-22 NOTE — ED Notes (Signed)
Patient with c/o fistula pain in right arm x 7 days. Fistula placed in right arm one week ago at [facility]. EMS reports they could not feel radial pulse in right arm.

## 2011-05-22 NOTE — ED Notes (Addendum)
Pt provided with discharge paperwork and prescription for pain medication.  Pt very angry that she is not being transported to [facility] to follow up with physican.  Explained to patient that she was determined by our ED physican to be medically stable and that there is no medically justifiable reason to transport her via ambulance or carelink.  Pt refused to sign discharge paperwork.

## 2011-05-22 NOTE — ED Notes (Signed)
Pt continues to attempt to call for ride home.

## 2011-05-22 NOTE — ED Provider Notes (Signed)
History     CSN: [id number]  Arrival date & time 05/22/11  1733   First MD Initiated Contact with Patient 05/22/11 1815      Chief Complaint  Patient presents with  . Pain    right Fistula    (Consider location/radiation/quality/duration/timing/severity/associated sxs/prior treatment) HPI [name] is a 26 y.o. female with a history of diabetes, hypertension, nephrotic syndrome, renal failure, DVT who presents to the Emergency Department complaining of pain at the fistula site in the right arm since placement on 05/14/2011. She has been seen at [facility] several days ago for similar pain and was given analgesics in the ER and told to followup with her surgeon. She receives dialysis Mondays, Wednesdays, Fridays. Her dialysis nurse attempted to make an appointment for her with Dr. Imogene Burn, vascular surgeon, today. The patient has not heard yet whether that appointment was completed. She reports burning and tingling at the operative site. He denies fever, chills, nausea, vomiting. Past Medical History  Diagnosis Date  . Diabetes mellitus     insulin dependent diabetes mellitus with history of poor control  . Hypertension   . Hyperlipidemia   . Nephrotic syndrome   . Anemia     chronic,  Dr. Mariel Sleet,  Transfused, May, 2012  . 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 Hypertrophic cardiomyopathy     with diastolic heart dysfunction  . 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 Vaginosis   . History of MRSA infection     multiple  . Glaucoma   . Warfarin anticoagulation     DVT  . Ejection fraction     55%, echo 12/2009  . Manic depressive disorder   . Noncompliance   . port-a-cath   . Dialysis patient     M-W-F    Past Surgical History  Procedure Date  . Left arm cyst removal   . Incise and drain abcess 04/24/11    for multiple MRSA -       March 2013 drain port of abcess  . Placement of port a cath    poor venous access    No family history on file.  History  Substance Use Topics  . Smoking status: Never Smoker   . Smokeless tobacco: Not on file  . Alcohol Use: No    OB History    Grav Para Term Preterm Abortions TAB SAB Ect Mult Living                  Review of Systems ROS: Statement: All systems negative except as marked or noted in the HPI; Constitutional: Negative for fever and chills. ; ; Eyes: Negative for eye pain, redness and discharge. ; ; ENMT: Negative for ear pain, hoarseness, nasal congestion, sinus pressure and sore throat. ; ; Cardiovascular: Negative for chest pain, palpitations, diaphoresis, dyspnea and peripheral edema. ; ; Respiratory: Negative for cough, wheezing and stridor. ; ; Gastrointestinal: Negative for nausea, vomiting, diarrhea, abdominal pain, blood in stool, hematemesis, jaundice and rectal bleeding. . ; ; Genitourinary: Negative for dysuria, flank pain and hematuria. ; ; Musculoskeletal: Negative for back pain and neck pain. Negative for swelling and trauma.; ; Skin: Negative for pruritus, rash, abrasions, blisters, ; Neuro: Negative for headache, lightheadedness and neck stiffness. Negative for weakness, altered level of consciousness , altered mental status, extremity weakness, paresthesias, involuntary movement, seizure and syncope.     Allergies  Ancef;  Morphine and related; Sulfa antibiotics; and Tomato  Home Medications   Current Outpatient Rx  Name Route Sig Dispense Refill  . ALPRAZOLAM 0.5 MG PO TABS Oral Take 0.5 mg by mouth 2 (two) times daily. For nerves/anxiety    . AMLODIPINE BESYLATE 10 MG PO TABS Oral Take 10 mg by mouth at bedtime.     Marland Kitchen BRIMONIDINE TARTRATE 0.1 % OP SOLN Both Eyes Place 1 drop into both eyes every 8 (eight) hours.      Marland Kitchen CLONIDINE HCL 0.3 MG/24HR TD PTWK Transdermal Place 1 patch onto the skin once a week.    . FUROSEMIDE 40 MG PO TABS Oral Take 40 mg by mouth daily.      Marland Kitchen GATIFLOXACIN 0.5 % OP SOLN Both Eyes  Place 1 drop into both eyes 4 (four) times daily.     Marland Kitchen HYDROCODONE-ACETAMINOPHEN 7.5-500 MG PO TABS Oral Take 1 tablet by mouth 2 (two) times daily.    . INSULIN GLARGINE 100 UNIT/ML [place] SOLN Subcutaneous Inject 36 Units into the skin 2 (two) times daily.      . INSULIN LISPRO (HUMAN) 100 UNIT/ML [place] SOLN Subcutaneous Inject into the skin 3 (three) times daily before meals. Per sliding scale    . LABETALOL HCL 300 MG PO TABS Oral Take 600 mg by mouth 2 (two) times daily.     Marland Kitchen LEVETIRACETAM 500 MG PO TABS Oral Take 500 mg by mouth 2 (two) times daily.    Marland Kitchen LISINOPRIL 20 MG PO TABS Oral Take 20 mg by mouth daily.      Marland Kitchen RENA-VITE PO TABS Oral Take 1 tablet by mouth daily.    Marland Kitchen PANTOPRAZOLE SODIUM 40 MG PO TBEC Oral Take 40 mg by mouth daily.     Marland Kitchen PREDNISOLONE ACETATE 1 % OP SUSP Both Eyes Place 1 drop into both eyes 4 (four) times daily.      . SERTRALINE HCL 50 MG PO TABS Oral Take 50 mg by mouth daily.     Marland Kitchen SEVELAMER CARBONATE 800 MG PO TABS Oral Take 1,600-3,200 mg by mouth 5 (five) times daily. Pt takes 4 tabs with meals and 2 tabs with snacks    . TIMOLOL HEMIHYDRATE 0.25 % OP SOLN Both Eyes Place 1 drop into both eyes 2 (two) times daily.     . TRAMADOL HCL 50 MG PO TABS Oral Take 1 tablet (50 mg total) by mouth every 6 (six) hours as needed for pain. 30 tablet 0    BP 148/97  Pulse 103  Temp(Src) 98.4 F (36.9 C) (Oral)  Resp 18  Ht 5\' 4"  (1.626 m)  Wt 134 lb (60.782 kg)  BMI 23.00 kg/m2  SpO2 100%  Physical Exam  Nursing note and vitals reviewed. Constitutional:       Awake, alert, nontoxic appearance.  HENT:  Head: Atraumatic.  Eyes: Right eye exhibits no discharge. Left eye exhibits no discharge.  Neck: Neck supple.  Pulmonary/Chest: Effort normal. She exhibits no tenderness.  Abdominal: Soft. There is no tenderness. There is no rebound.  Musculoskeletal: She exhibits no tenderness.       Baseline ROM, no obvious new focal weakness.  Neurological:       Mental status  and motor strength appears baseline for patient and situation.  Skin: No rash noted.       AV graft site to right forearm. No bruit or thrill. No significant welling, erythema present.  Psychiatric: She has a normal mood and affect.  ED Course  Procedures (including critical care time)     MDM  Patient with end-stage renal disease and recent placement of an AV graft right forearm, which has not yet matured and is painful. Given analgesics. Urged patient to follow up with Dr. Imogene Burn, vascular surgeon.Pt stable in ED with no significant deterioration in condition.The patient appears reasonably screened and/or stabilized for discharge and I doubt any other medical condition or other [facility] requiring further screening, evaluation, or treatment in the ED at this time prior to discharge.  MDM Reviewed: nursing note and vitals           Nicoletta Dress. Colon Branch, MD 05/22/11 1610

## 2011-05-22 NOTE — ED Notes (Signed)
Pt reporting continued pain in right arm.  Requesting additional pain medication.  Explained to pt that medication was given less than 30 minutes ago, and has not had time to be effective.  Offered additional comfort measures. Pt refused.

## 2011-05-22 NOTE — Discharge Instructions (Signed)
Follow up with your vascular surgeon, Dr. Imogene Burn. Call his office in the morning for an appointment before your scheduled one in May.

## 2011-05-22 NOTE — ED Notes (Signed)
Pt reports pain in right arm where dialysis fistula was placed last week.  Reports she went to [facility] and was instructed to go to [facility].  Faint radial pulse palpated on right.  Pt reports arm keeps getting swollen.

## 2011-05-23 ENCOUNTER — Encounter: Payer: Self-pay | Admitting: Vascular Surgery

## 2011-05-23 ENCOUNTER — Ambulatory Visit (INDEPENDENT_AMBULATORY_CARE_PROVIDER_SITE_OTHER): Payer: Medicaid Other | Admitting: Vascular Surgery

## 2011-05-23 VITALS — BP 201/146 | HR 121 | Temp 98.5°F | Ht 68.0 in | Wt 131.0 lb

## 2011-05-23 DIAGNOSIS — Z48812 Encounter for surgical aftercare following surgery on the circulatory system: Secondary | ICD-10-CM

## 2011-05-23 DIAGNOSIS — T82898A Other specified complication of vascular prosthetic devices, implants and grafts, initial encounter: Secondary | ICD-10-CM

## 2011-05-23 NOTE — Progress Notes (Signed)
Patient returns for followup today. She underwent a right basilic vein transposition first stage procedure reduction Chen on 05/14/2011. She complains today of swelling and pain in her right upper extremity. She apparently has obtained multiple narcotic prescriptions from several doctors around town. She is requesting more pain medication today. She states that also at the dialysis center they told her the fistula was not working.  Physical exam: Filed Vitals:   05/23/11 1150  BP: 201/146  Pulse: 121  Temp: 98.5 F (36.9 C)  TempSrc: Oral  Height: 5\' 8"  (1.727 m)  Weight: 131 lb (59.421 kg)   Right upper extremity: Absent radial and ulnar pulse but with biphasic Doppler signals, no audible bruit or palpable thrill in the fistula, healing incision, trace edema at the incision site, no hematoma, otherwise no swelling in the right upper extremity  Assessment: Occluded the first stage basilic vein transposition fistula. The patient was reassured that most of the pain can be controlled at this point with extra strength Tylenol. She has adequate arterial circulation. According to Dr. Nicky Pugh operative note the artery was fairly small. Consideration may need to be given for placement of AV graft this her next access procedure possibly an upper arm loop graft. However, at this time she still has significant pain from her previous operation and she needs some time to recover. She will keep originally scheduled followup in one month to determine what her next access procedure may be.  Plan: See above  Fabienne Bruns, MD [facility] [place] Office: [phone] Pager: [phone]

## 2011-05-24 ENCOUNTER — Encounter (HOSPITAL_COMMUNITY): Payer: Self-pay | Admitting: Emergency Medicine

## 2011-05-24 ENCOUNTER — Emergency Department (HOSPITAL_COMMUNITY)
Admission: EM | Admit: 2011-05-24 | Discharge: 2011-05-24 | Disposition: A | Discharge status: Home / Self Care | Payer: Medicaid Other | Attending: Emergency Medicine | Admitting: Emergency Medicine

## 2011-05-24 DIAGNOSIS — Z79899 Other long term (current) drug therapy: Secondary | ICD-10-CM | POA: Insufficient documentation

## 2011-05-24 DIAGNOSIS — IMO0002 Reserved for concepts with insufficient information to code with codable children: Secondary | ICD-10-CM | POA: Insufficient documentation

## 2011-05-24 DIAGNOSIS — Z8673 Personal history of transient ischemic attack (TIA), and cerebral infarction without residual deficits: Secondary | ICD-10-CM | POA: Insufficient documentation

## 2011-05-24 DIAGNOSIS — E119 Type 2 diabetes mellitus without complications: Secondary | ICD-10-CM | POA: Insufficient documentation

## 2011-05-24 DIAGNOSIS — Y838 Other surgical procedures as the cause of abnormal reaction of the patient, or of later complication, without mention of misadventure at the time of the procedure: Secondary | ICD-10-CM | POA: Insufficient documentation

## 2011-05-24 DIAGNOSIS — E785 Hyperlipidemia, unspecified: Secondary | ICD-10-CM | POA: Insufficient documentation

## 2011-05-24 DIAGNOSIS — I10 Essential (primary) hypertension: Secondary | ICD-10-CM | POA: Insufficient documentation

## 2011-05-24 DIAGNOSIS — IMO0001 Reserved for inherently not codable concepts without codable children: Secondary | ICD-10-CM | POA: Insufficient documentation

## 2011-05-24 DIAGNOSIS — T888XXA Other specified complications of surgical and medical care, not elsewhere classified, initial encounter: Secondary | ICD-10-CM

## 2011-05-24 MED ORDER — HYDROMORPHONE HCL PF 2 MG/ML IJ SOLN
2.0000 mg | Freq: Once | INTRAMUSCULAR | Status: AC
  Administered 2011-05-24: 2 mg via INTRAMUSCULAR
  Filled 2011-05-24: qty 1

## 2011-05-24 MED ORDER — OXYCODONE-ACETAMINOPHEN 5-325 MG PO TABS: 2.0000 | ORAL_TABLET | ORAL | Status: AC | PRN

## 2011-05-24 NOTE — Consult Note (Signed)
Pt complains of pain at recent AVF site right arm Seen in office yesterday.  The antecubital area was fairly flat at that time.  She now has a 4x4 cm non pulsatile mass in this area.  Skin is not tense or compromised.  No ecchymosis  She wishes to have this drained for pain relief.  This is most likely a seroma.  The fistula is occluded.  Will drain in OR tomorrow 730 case.  Pt to present to short stay at 6 am Will need Istat, and consent for evacuation fluid collection right arm  Fabienne Bruns, MD [facility] [place] Office: [phone] Pager: [phone]

## 2011-05-24 NOTE — ED Notes (Signed)
Pt waiting for ride, ride to pick her up and bring her back in am. "feels better".

## 2011-05-24 NOTE — ED Provider Notes (Signed)
I saw and evaluated the patient, reviewed the resident's note and I agree with the findings and plan.   Patient's right arm with swelling. Spoke with vascular surgeon he will come in to evaluate  Toy Baker, MD 05/24/11 2056

## 2011-05-24 NOTE — ED Notes (Signed)
Dr. Darrick Penna CV into room.

## 2011-05-24 NOTE — ED Notes (Signed)
After speaking with CVTS, pt declined surgery tonight due to "no ride home" and opted to return in am for surgery per Dr. Darrick Penna. Dr. Darrick Penna re-contacted by phone, "plan remains as is, pt is to return for surgery in am per pt's choice".

## 2011-05-24 NOTE — ED Notes (Signed)
PT. REPORTS RIGHT ARM PAIN WITH SWELLING 2 DAYS POST FISTULA SURGERY LAST WEEK.

## 2011-05-24 NOTE — ED Notes (Addendum)
EDP x2 at [facility] with [place].

## 2011-05-24 NOTE — ED Notes (Addendum)
R radial pulses palpable & doppered, R radial & ulnar pulses dopplered. No discernable bruit or thrill in AVG  R upper arm, placed last Thursday, has R chest HD catheter, had HD today in [place], [place]. Attends HD MWF, attended today, "no problems", pinpoints arm pain from "R shoulder to fingers", also mentions R chest pain.

## 2011-05-24 NOTE — ED Provider Notes (Signed)
History     CSN: [id number]  Arrival date & time 05/24/11  4098   First MD Initiated Contact with Patient 05/24/11 1952      Chief Complaint  Patient presents with  . Arm Injury    (Consider location/radiation/quality/duration/timing/severity/associated sxs/prior treatment) Patient is a 26 y.o. female presenting with extremity pain. The history is provided by the patient.  Extremity Pain This is a new problem. The current episode started today. The problem occurs constantly. The problem has been unchanged. Associated symptoms include arthralgias, joint swelling and myalgias. Pertinent negatives include no abdominal pain, chest pain, coughing, fever, headaches, nausea, neck pain, rash or vomiting. The symptoms are aggravated by bending. She has tried oral narcotics for the symptoms. The treatment provided mild relief.    Past Medical History  Diagnosis Date  . Diabetes mellitus     insulin dependent diabetes mellitus with history of poor control  . Hypertension   . Hyperlipidemia   . Nephrotic syndrome   . Anemia     chronic,  Dr. Mariel Sleet,  Transfused, May, 2012  . 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 Hypertrophic cardiomyopathy     with diastolic heart dysfunction  . 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 Vaginosis   . History of MRSA infection     multiple  . Glaucoma   . Warfarin anticoagulation     DVT  . Ejection fraction     55%, echo 12/2009  . Manic depressive disorder   . Noncompliance   . port-a-cath   . Dialysis patient     M-W-F    Past Surgical History  Procedure Date  . Left arm cyst removal   . Incise and drain abcess 04/24/11    for multiple MRSA -       March 2013 drain port of abcess  . Placement of port a cath     poor venous access  . Av fistula placement     Family History  Problem Relation Age of Onset  . Diabetes Mother   . Hypertension Mother   . Diabetes Father      History  Substance Use Topics  . Smoking status: Never Smoker   . Smokeless tobacco: Not on file  . Alcohol Use: No    OB History    Grav Para Term Preterm Abortions TAB SAB Ect Mult Living                  Review of Systems  Constitutional: Negative for fever.  HENT: Negative for neck pain.   Respiratory: Negative for cough and shortness of breath.   Cardiovascular: Negative for chest pain.  Gastrointestinal: Negative for nausea, vomiting, abdominal pain and diarrhea.  Genitourinary: Negative for dysuria.  Musculoskeletal: Positive for myalgias, joint swelling and arthralgias.  Skin: Negative for rash.  Neurological: Negative for headaches.  All other systems reviewed and are negative.    Allergies  Ancef; Morphine and related; Sulfa antibiotics; and Tomato  Home Medications   Current Outpatient Rx  Name Route Sig Dispense Refill  . ALPRAZOLAM 0.5 MG PO TABS Oral Take 0.5 mg by mouth 2 (two) times daily as needed. For nerves/anxiety    . AMLODIPINE BESYLATE 10 MG PO TABS Oral Take 10 mg by mouth at bedtime.     Marland Kitchen CLONIDINE HCL 0.3 MG/24HR TD PTWK Transdermal Place 1 patch onto the skin once a week. Applies new  patch on Monday    . FUROSEMIDE 40 MG PO TABS Oral Take 40 mg by mouth daily.      . INSULIN GLARGINE 100 UNIT/ML [place] SOLN Subcutaneous Inject 25 Units into the skin at bedtime.     . INSULIN LISPRO (HUMAN) 100 UNIT/ML [place] SOLN Subcutaneous Inject 5-20 Units into the skin 3 (three) times daily before meals. Per sliding scale. Pt takes anywhere from 5 to 20 units.    Marland Kitchen LABETALOL HCL 300 MG PO TABS Oral Take 600 mg by mouth 2 (two) times daily.     Marland Kitchen LEVETIRACETAM 500 MG PO TABS Oral Take 500 mg by mouth 2 (two) times daily.    Marland Kitchen RENA-VITE PO TABS Oral Take 1 tablet by mouth daily.    . SERTRALINE HCL 50 MG PO TABS Oral Take 50 mg by mouth daily.     Marland Kitchen SEVELAMER CARBONATE 800 MG PO TABS Oral Take 1,600-3,200 mg by mouth 5 (five) times daily. Pt takes 4 tabs with  meals and 2 tabs with snacks      BP 157/102  Pulse 122  Temp(Src) 98.3 F (36.8 C) (Oral)  Resp 18  SpO2 100%  Physical Exam  Nursing note and vitals reviewed. Constitutional: She is oriented to person, place, and time. She appears well-developed and well-nourished.  HENT:  Head: Normocephalic and atraumatic.  Eyes: EOM are normal.  Neck: Normal range of motion.  Cardiovascular: Normal rate, regular rhythm and normal heart sounds.   Pulmonary/Chest: Effort normal and breath sounds normal. No respiratory distress.  Abdominal: Soft. There is no tenderness.  Musculoskeletal: She exhibits edema and tenderness.       Right elbow: tenderness found.       Arms: Neurological: She is alert and oriented to person, place, and time.  Skin: Skin is warm and dry.  Psychiatric: She has a normal mood and affect.    ED Course  Procedures (including critical care time)  Labs Reviewed - No data to display No results found.   1. Fluid collection at surgical site       MDM  Patient presents with increased right arm pain at site of recently created AV fistula. She had upper arm fistula created approximately 1 and 1/2 weeks ago. She has been having difficulty controlling her pain after this. Today she had a relatively sudden onset of this swelling at the surgical site. She also had new onset of paresthesias of her distal hand. On my evaluation patient does have focal swelling in the surgical area. Surgical site is well appearing. She states this area was previously completely flat. Distally she has good cap refill. Neither any radial or ulnar pulses palpated but does have a strong biphasic Doppler signal; this is expected. Limited bedside ultrasound did show what appeared to be a fluid collection at the site. Dr. Darrick Penna with vascular surgery consulted.    Patient seen by Dr. Darrick Penna. He felt findings most consistent with fluid collection, either seroma versus hematoma. He felt no indication for  emergent surgery but did offer the patient drainage in the operating room tonight. He indicated he was not going to admit her thus she preferred to have the surgery tomorrow. She was given pain control in the ED.  She will followup tomorrow for surgery.     Donnamarie Poag, MD 05/24/11 [phone]

## 2011-05-25 ENCOUNTER — Ambulatory Visit (HOSPITAL_COMMUNITY)
Admission: EM | Admit: 2011-05-25 | Discharge: 2011-05-25 | Disposition: A | Discharge status: Home / Self Care | Payer: Medicaid Other | Source: Ambulatory Visit | Attending: Vascular Surgery | Admitting: Vascular Surgery

## 2011-05-25 ENCOUNTER — Inpatient Hospital Stay (HOSPITAL_COMMUNITY): Payer: Medicaid Other | Admitting: Anesthesiology

## 2011-05-25 ENCOUNTER — Encounter (HOSPITAL_COMMUNITY)
Admission: EM | Disposition: A | Discharge status: Home / Self Care | Payer: Self-pay | Source: Ambulatory Visit | Attending: Vascular Surgery

## 2011-05-25 ENCOUNTER — Encounter (HOSPITAL_COMMUNITY): Payer: Self-pay | Admitting: Anesthesiology

## 2011-05-25 ENCOUNTER — Encounter (HOSPITAL_COMMUNITY): Payer: Self-pay | Admitting: *Deleted

## 2011-05-25 DIAGNOSIS — N186 End stage renal disease: Secondary | ICD-10-CM | POA: Insufficient documentation

## 2011-05-25 DIAGNOSIS — Z992 Dependence on renal dialysis: Secondary | ICD-10-CM | POA: Insufficient documentation

## 2011-05-25 DIAGNOSIS — Y832 Surgical operation with anastomosis, bypass or graft as the cause of abnormal reaction of the patient, or of later complication, without mention of misadventure at the time of the procedure: Secondary | ICD-10-CM | POA: Insufficient documentation

## 2011-05-25 DIAGNOSIS — E109 Type 1 diabetes mellitus without complications: Secondary | ICD-10-CM | POA: Insufficient documentation

## 2011-05-25 DIAGNOSIS — T82898A Other specified complication of vascular prosthetic devices, implants and grafts, initial encounter: Secondary | ICD-10-CM | POA: Insufficient documentation

## 2011-05-25 DIAGNOSIS — F3130 Bipolar disorder, current episode depressed, mild or moderate severity, unspecified: Secondary | ICD-10-CM | POA: Insufficient documentation

## 2011-05-25 DIAGNOSIS — I120 Hypertensive chronic kidney disease with stage 5 chronic kidney disease or end stage renal disease: Secondary | ICD-10-CM | POA: Insufficient documentation

## 2011-05-25 DIAGNOSIS — Z794 Long term (current) use of insulin: Secondary | ICD-10-CM | POA: Insufficient documentation

## 2011-05-25 DIAGNOSIS — IMO0002 Reserved for concepts with insufficient information to code with codable children: Secondary | ICD-10-CM

## 2011-05-25 HISTORY — PX: HEMATOMA EVACUATION: SHX5118

## 2011-05-25 LAB — HCG, SERUM, QUALITATIVE: Preg, Serum: NEGATIVE

## 2011-05-25 SURGERY — EVACUATION HEMATOMA
Anesthesia: General | Site: Arm Upper | Laterality: Right | Wound class: Clean

## 2011-05-25 MED ORDER — VANCOMYCIN HCL 1000 MG IV SOLR
1000.0000 mg | INTRAVENOUS | Status: DC | PRN
  Administered 2011-05-25: 1000 mg via INTRAVENOUS

## 2011-05-25 MED ORDER — SODIUM CHLORIDE 0.9 % IV SOLN
INTRAVENOUS | Status: DC | PRN
  Administered 2011-05-25: 09:00:00 via INTRAVENOUS

## 2011-05-25 MED ORDER — HYDROMORPHONE HCL PF 1 MG/ML IJ SOLN
0.2500 mg | INTRAMUSCULAR | Status: DC | PRN
  Administered 2011-05-25 (×2): 0.5 mg via INTRAVENOUS

## 2011-05-25 MED ORDER — FENTANYL CITRATE 0.05 MG/ML IJ SOLN
INTRAMUSCULAR | Status: DC | PRN
  Administered 2011-05-25: 50 ug via INTRAVENOUS

## 2011-05-25 MED ORDER — ONDANSETRON HCL 4 MG/2ML IJ SOLN
INTRAMUSCULAR | Status: DC | PRN
  Administered 2011-05-25: 4 mg via INTRAVENOUS

## 2011-05-25 MED ORDER — MIDAZOLAM HCL 5 MG/5ML IJ SOLN
INTRAMUSCULAR | Status: DC | PRN
  Administered 2011-05-25: 2 mg via INTRAVENOUS

## 2011-05-25 MED ORDER — SODIUM CHLORIDE 0.9 % IR SOLN
Status: DC | PRN
  Administered 2011-05-25: 1

## 2011-05-25 MED ORDER — VANCOMYCIN HCL IN DEXTROSE 1-5 GM/200ML-% IV SOLN
INTRAVENOUS | Status: AC
  Filled 2011-05-25: qty 200

## 2011-05-25 MED ORDER — HYDROMORPHONE HCL PF 1 MG/ML IJ SOLN
INTRAMUSCULAR | Status: AC
  Filled 2011-05-25: qty 1

## 2011-05-25 MED ORDER — LIDOCAINE HCL (CARDIAC) 20 MG/ML IV SOLN
INTRAVENOUS | Status: DC | PRN
  Administered 2011-05-25: 80 mg via INTRAVENOUS

## 2011-05-25 MED ORDER — GLUCOSE 40 % PO GEL
1.0000 | Freq: Once | ORAL | Status: AC
  Administered 2011-05-25: 37.5 g via ORAL

## 2011-05-25 MED ORDER — SODIUM CHLORIDE 0.9 % IV SOLN: INTRAVENOUS | Status: DC

## 2011-05-25 MED ORDER — ONDANSETRON HCL 4 MG/2ML IJ SOLN: 4.0000 mg | Freq: Four times a day (QID) | INTRAMUSCULAR | Status: DC | PRN

## 2011-05-25 MED ORDER — LIDOCAINE HCL 1 % IJ SOLN
INTRAMUSCULAR | Status: DC | PRN
  Administered 2011-05-25: 30 mL

## 2011-05-25 MED ORDER — INSULIN ASPART 100 UNIT/ML ~~LOC~~ SOLN
8.0000 [IU] | Freq: Once | SUBCUTANEOUS | Status: AC
  Administered 2011-05-25: 8 [IU] via SUBCUTANEOUS

## 2011-05-25 MED ORDER — PROPOFOL 10 MG/ML IV EMUL
INTRAVENOUS | Status: DC | PRN
  Administered 2011-05-25: 200 mg via INTRAVENOUS

## 2011-05-25 SURGICAL SUPPLY — 45 items
BANDAGE ESMARK 6X9 LF (GAUZE/BANDAGES/DRESSINGS) IMPLANT
BNDG ESMARK 6X9 LF (GAUZE/BANDAGES/DRESSINGS)
CANISTER SUCTION 2500CC (MISCELLANEOUS) ×2 IMPLANT
CLOTH BEACON ORANGE TIMEOUT ST (SAFETY) ×2 IMPLANT
COVER SURGICAL LIGHT HANDLE (MISCELLANEOUS) ×4 IMPLANT
CUFF TOURNIQUET SINGLE 18IN (TOURNIQUET CUFF) IMPLANT
CUFF TOURNIQUET SINGLE 24IN (TOURNIQUET CUFF) IMPLANT
CUFF TOURNIQUET SINGLE 34IN LL (TOURNIQUET CUFF) IMPLANT
CUFF TOURNIQUET SINGLE 44IN (TOURNIQUET CUFF) IMPLANT
DERMABOND ADHESIVE PROPEN (GAUZE/BANDAGES/DRESSINGS) ×2
DERMABOND ADVANCED (GAUZE/BANDAGES/DRESSINGS)
DERMABOND ADVANCED .7 DNX12 (GAUZE/BANDAGES/DRESSINGS) IMPLANT
DERMABOND ADVANCED .7 DNX6 (GAUZE/BANDAGES/DRESSINGS) ×2 IMPLANT
DRAIN SNY 10X20 3/4 PERF (WOUND CARE) IMPLANT
DRAPE ORTHO SPLIT 77X108 STRL (DRAPES) ×1
DRAPE SURG ORHT 6 SPLT 77X108 (DRAPES) ×1 IMPLANT
DRAPE WARM FLUID 44X44 (DRAPE) IMPLANT
DRSG COVADERM 4X8 (GAUZE/BANDAGES/DRESSINGS) IMPLANT
ELECT REM PT RETURN 9FT ADLT (ELECTROSURGICAL) ×2
ELECTRODE REM PT RTRN 9FT ADLT (ELECTROSURGICAL) ×1 IMPLANT
EVACUATOR SILICONE 100CC (DRAIN) IMPLANT
GLOVE BIO SURGEON STRL SZ7.5 (GLOVE) ×6 IMPLANT
GOWN PREVENTION PLUS XLARGE (GOWN DISPOSABLE) ×2 IMPLANT
GOWN STRL NON-REIN LRG LVL3 (GOWN DISPOSABLE) ×2 IMPLANT
KIT BASIN OR (CUSTOM PROCEDURE TRAY) ×2 IMPLANT
KIT ROOM TURNOVER OR (KITS) ×2 IMPLANT
NS IRRIG 1000ML POUR BTL (IV SOLUTION) ×2 IMPLANT
PACK GENERAL/GYN (CUSTOM PROCEDURE TRAY) ×2 IMPLANT
PAD ARMBOARD 7.5X6 YLW CONV (MISCELLANEOUS) ×2 IMPLANT
PADDING CAST COTTON 6X4 STRL (CAST SUPPLIES) IMPLANT
SPONGE SURGIFOAM ABS GEL 100 (HEMOSTASIS) IMPLANT
STAPLER VISISTAT 35W (STAPLE) IMPLANT
STOCKINETTE 6  STRL (DRAPES) ×1
STOCKINETTE 6 STRL (DRAPES) ×1 IMPLANT
SUT ETHILON 3 0 PS 1 (SUTURE) IMPLANT
SUT PROLENE 5 0 C 1 24 (SUTURE) IMPLANT
SUT PROLENE 6 0 CC (SUTURE) IMPLANT
SUT VIC AB 2-0 CTX 36 (SUTURE) IMPLANT
SUT VIC AB 3-0 SH 27 (SUTURE) ×1
SUT VIC AB 3-0 SH 27X BRD (SUTURE) ×1 IMPLANT
SUT VICRYL 4-0 PS2 18IN ABS (SUTURE) ×2 IMPLANT
TOWEL OR 17X24 6PK STRL BLUE (TOWEL DISPOSABLE) ×4 IMPLANT
TOWEL OR 17X26 10 PK STRL BLUE (TOWEL DISPOSABLE) ×2 IMPLANT
UNDERPAD 30X30 INCONTINENT (UNDERPADS AND DIAPERS) ×2 IMPLANT
WATER STERILE IRR 1000ML POUR (IV SOLUTION) IMPLANT

## 2011-05-25 NOTE — Transfer of Care (Signed)
Immediate Anesthesia Transfer of Care Note  Patient: [name]  Procedure(s) Performed: Procedure(s) (LRB): EVACUATION HEMATOMA (Right)  Patient Location: PACU  Anesthesia Type: General  Level of Consciousness: awake, alert  and oriented  Airway & Oxygen Therapy: Patient Spontanous Breathing and Patient connected to nasal cannula oxygen  Post-op Assessment: Report given to PACU RN and Post -op Vital signs reviewed and stable  Post vital signs: Reviewed and stable  Complications: No apparent anesthesia complications

## 2011-05-25 NOTE — H&P (View-Only) (Signed)
Pt complains of pain at recent AVF site right arm Seen in office yesterday.  The antecubital area was fairly flat at that time.  She now has a 4x4 cm non pulsatile mass in this area.  Skin is not tense or compromised.  No ecchymosis  She wishes to have this drained for pain relief.  This is most likely a seroma.  The fistula is occluded.  Will drain in OR tomorrow 730 case.  Pt to present to short stay at 6 am Will need Istat, and consent for evacuation fluid collection right arm  Caidyn Henricksen, MD [facility] [place] Office: [phone] Pager: [phone]  

## 2011-05-25 NOTE — Interval H&P Note (Signed)
History and Physical Interval Note:  05/25/2011 9:01 AM  [name]  has presented today for surgery, with the diagnosis of Right Arm Hematoma  The various methods of treatment have been discussed with the patient and family. After consideration of risks, benefits and other options for treatment, the patient has consented to  Procedure(s) (LRB): EVACUATION HEMATOMA (Right) as a surgical intervention .  The patients' history has been reviewed, patient examined, no change in status, stable for surgery.  I have reviewed the patients' chart and labs.  Questions were answered to the patient's satisfaction.     Rawley Harju E

## 2011-05-25 NOTE — Op Note (Signed)
Procedure: Incision and drainage of right arm  Preoperative Diagnosis: Seroma right arm  Postoperative diagnosis: Same  Anesthesia: Gen.  Operative findings: 3 x 3 cm seroma, no evidence of infection, occluded fistula  Operative details: After obtaining informed consent, the patient was taken to the operating room. The patient was placed in supine position on the operating table. After induction of general anesthesia the patient's entire right upper extremity was prepped and draped in the usual sterile fashion. A transverse incision was made through a pre-existing scar in the right antecubital level. On opening this there was clear fluid under pressure. This was all completely evacuated. There is no obvious evidence of infection. This appeared to be a seroma. The cavity was approximately 3 x 3 cm. There is no obvious lymphatic leak. The wound was thoroughly irrigated normal saline solution. The patient's fistula was inspected and was found to be occluded. This was known clinically 2 days ago. The subcutaneous tissues were reapproximated using a running 3-0 Vicryl suture. The wound was thoroughly infiltrated with 1% plain lidocaine. The skin was closed with a running 4 0 Vicryl subcuticular stitch. Dermabond was applied to the skin. The patient tolerated the procedure well and there were no complications. The patient was taken to the recovery room in stable condition. Instrument sponge and needle counts were correct at the end of the case.  Fabienne Bruns, MD [facility] [place] Office: [phone] Pager: [phone]

## 2011-05-25 NOTE — Progress Notes (Signed)
White port to right chest hdc flushed with 109ml/ns and 1.46ml/1000 unit heparin instilled, capped, clamped, and tapped closed

## 2011-05-25 NOTE — Anesthesia Preprocedure Evaluation (Addendum)
Anesthesia Evaluation  Patient identified by MRN, date of birth, ID band Patient awake    Reviewed: Allergy & Precautions, H&P , NPO status , Patient's Chart, lab work & pertinent test results  History of Anesthesia Complications Negative for: history of anesthetic complications  Airway Mallampati: II  Neck ROM: full    Dental   Pulmonary neg pulmonary ROS,          Cardiovascular hypertension, Pt. on home beta blockers     Neuro/Psych PSYCHIATRIC DISORDERS Depression Bipolar Disorder negative neurological ROS     GI/Hepatic negative GI ROS, Neg liver ROS,   Endo/Other  Diabetes mellitus-, Type 1, Insulin Dependent  Renal/GU ESRF and DialysisRenal disease  negative genitourinary   Musculoskeletal negative musculoskeletal ROS (+)   Abdominal   Peds negative pediatric ROS (+)  Hematology negative hematology ROS (+)   Anesthesia Other Findings   Reproductive/Obstetrics negative OB ROS                         Anesthesia Physical Anesthesia Plan  ASA: III  Anesthesia Plan: General   Post-op Pain Management:    Induction: Intravenous  Airway Management Planned: LMA  Additional Equipment:   Intra-op Plan:   Post-operative Plan:   Informed Consent: I have reviewed the patients History and Physical, chart, labs and discussed the procedure including the risks, benefits and alternatives for the proposed anesthesia with the patient or authorized representative who has indicated his/her understanding and acceptance.   Dental advisory given  Plan Discussed with: CRNA and Surgeon  Anesthesia Plan Comments:        Anesthesia Quick Evaluation

## 2011-05-25 NOTE — Anesthesia Postprocedure Evaluation (Signed)
Anesthesia Post Note  Patient: [name]  Procedure(s) Performed: Procedure(s) (LRB): EVACUATION HEMATOMA (Right)  Anesthesia type: General  Patient location: PACU  Post pain: Pain level controlled and Adequate analgesia  Post assessment: Post-op Vital signs reviewed, Patient's Cardiovascular Status Stable, Respiratory Function Stable, Patent Airway and Pain level controlled  Last Vitals:  Filed Vitals:   05/25/11 1053  BP:   Pulse: 113  Temp:   Resp: 18    Post vital signs: Reviewed and stable  Level of consciousness: awake, alert  and oriented  Complications: No apparent anesthesia complications

## 2011-05-25 NOTE — ED Provider Notes (Signed)
I saw and evaluated the patient, reviewed the resident's note and I agree with the findings and plan.  [place] T Jermayne Sweeney, MD 05/25/11 1542 

## 2011-05-25 NOTE — Preoperative (Signed)
Beta Blockers   Reason not to administer Beta Blockers:Not Applicable 

## 2011-05-27 ENCOUNTER — Encounter (HOSPITAL_COMMUNITY): Payer: Self-pay | Admitting: Vascular Surgery

## 2011-05-27 ENCOUNTER — Telehealth: Payer: Self-pay | Admitting: *Deleted

## 2011-05-27 ENCOUNTER — Encounter

## 2011-05-27 ENCOUNTER — Encounter: Admitting: Surgery

## 2011-05-27 LAB — POCT I-STAT 4, (NA,K, GLUC, HGB,HCT)
HCT: 30 % — ABNORMAL LOW (ref 36.0–46.0)
Hemoglobin: 10.2 g/dL — ABNORMAL LOW (ref 12.0–15.0)
Potassium: 3.7 mEq/L (ref 3.5–5.1)
Sodium: 137 mEq/L (ref 135–145)

## 2011-05-27 NOTE — Telephone Encounter (Addendum)
FYI below concerning pain medicine Message copied by Melene Plan on Mon May 27, 2011 10:54 AM ------      Message from: Sherren Kerns      Created: Sat May 25, 2011  9:56 AM       I and D right arm       No asst      Needs to keep her follow up with Dr Imogene Burn      Left hospital with prescription for pain today      Does not need any further refills      If she calls with pain issues refer to Dr Imogene Burn      She is not to be given any prescriptions without seeing MD first            Leonette Most

## 2011-06-14 ENCOUNTER — Ambulatory Visit: Payer: Medicaid Other | Admitting: Vascular Surgery

## 2011-06-20 ENCOUNTER — Other Ambulatory Visit: Payer: Self-pay | Admitting: *Deleted

## 2011-06-20 DIAGNOSIS — N186 End stage renal disease: Secondary | ICD-10-CM

## 2011-06-20 DIAGNOSIS — Z01810 Encounter for preprocedural cardiovascular examination: Secondary | ICD-10-CM

## 2011-06-20 DIAGNOSIS — T82898A Other specified complication of vascular prosthetic devices, implants and grafts, initial encounter: Secondary | ICD-10-CM

## 2011-06-27 ENCOUNTER — Encounter: Payer: Self-pay | Admitting: Vascular Surgery

## 2011-06-28 ENCOUNTER — Ambulatory Visit: Payer: Medicaid Other | Admitting: Vascular Surgery

## 2011-06-28 ENCOUNTER — Inpatient Hospital Stay (HOSPITAL_COMMUNITY)
Admission: EM | Admit: 2011-06-28 | Discharge: 2011-07-10 | DRG: 673 | Disposition: A | Discharge status: Home / Self Care | Payer: Medicaid Other | Attending: Internal Medicine | Admitting: Internal Medicine

## 2011-06-28 ENCOUNTER — Emergency Department (HOSPITAL_COMMUNITY): Payer: Medicaid Other

## 2011-06-28 ENCOUNTER — Encounter (HOSPITAL_COMMUNITY): Payer: Self-pay | Admitting: *Deleted

## 2011-06-28 DIAGNOSIS — N2581 Secondary hyperparathyroidism of renal origin: Secondary | ICD-10-CM | POA: Diagnosis present

## 2011-06-28 DIAGNOSIS — K5289 Other specified noninfective gastroenteritis and colitis: Secondary | ICD-10-CM | POA: Diagnosis present

## 2011-06-28 DIAGNOSIS — I9589 Other hypotension: Secondary | ICD-10-CM | POA: Diagnosis not present

## 2011-06-28 DIAGNOSIS — K529 Noninfective gastroenteritis and colitis, unspecified: Secondary | ICD-10-CM

## 2011-06-28 DIAGNOSIS — E871 Hypo-osmolality and hyponatremia: Secondary | ICD-10-CM | POA: Diagnosis not present

## 2011-06-28 DIAGNOSIS — I10 Essential (primary) hypertension: Secondary | ICD-10-CM | POA: Diagnosis not present

## 2011-06-28 DIAGNOSIS — D631 Anemia in chronic kidney disease: Secondary | ICD-10-CM | POA: Diagnosis present

## 2011-06-28 DIAGNOSIS — Z86718 Personal history of other venous thrombosis and embolism: Secondary | ICD-10-CM

## 2011-06-28 DIAGNOSIS — R109 Unspecified abdominal pain: Secondary | ICD-10-CM

## 2011-06-28 DIAGNOSIS — E785 Hyperlipidemia, unspecified: Secondary | ICD-10-CM | POA: Diagnosis present

## 2011-06-28 DIAGNOSIS — R112 Nausea with vomiting, unspecified: Secondary | ICD-10-CM | POA: Diagnosis present

## 2011-06-28 DIAGNOSIS — N186 End stage renal disease: Secondary | ICD-10-CM

## 2011-06-28 DIAGNOSIS — G894 Chronic pain syndrome: Secondary | ICD-10-CM | POA: Diagnosis present

## 2011-06-28 DIAGNOSIS — I422 Other hypertrophic cardiomyopathy: Secondary | ICD-10-CM | POA: Diagnosis present

## 2011-06-28 DIAGNOSIS — R5381 Other malaise: Secondary | ICD-10-CM | POA: Diagnosis not present

## 2011-06-28 DIAGNOSIS — A4902 Methicillin resistant Staphylococcus aureus infection, unspecified site: Secondary | ICD-10-CM | POA: Diagnosis present

## 2011-06-28 DIAGNOSIS — I120 Hypertensive chronic kidney disease with stage 5 chronic kidney disease or end stage renal disease: Secondary | ICD-10-CM | POA: Diagnosis present

## 2011-06-28 DIAGNOSIS — N049 Nephrotic syndrome with unspecified morphologic changes: Secondary | ICD-10-CM | POA: Diagnosis present

## 2011-06-28 DIAGNOSIS — R509 Fever, unspecified: Secondary | ICD-10-CM

## 2011-06-28 DIAGNOSIS — H548 Legal blindness, as defined in USA: Secondary | ICD-10-CM | POA: Diagnosis present

## 2011-06-28 DIAGNOSIS — Z794 Long term (current) use of insulin: Secondary | ICD-10-CM

## 2011-06-28 DIAGNOSIS — Z4901 Encounter for fitting and adjustment of extracorporeal dialysis catheter: Secondary | ICD-10-CM

## 2011-06-28 DIAGNOSIS — R000 Tachycardia, unspecified: Secondary | ICD-10-CM | POA: Diagnosis present

## 2011-06-28 DIAGNOSIS — E875 Hyperkalemia: Secondary | ICD-10-CM | POA: Diagnosis present

## 2011-06-28 DIAGNOSIS — E1065 Type 1 diabetes mellitus with hyperglycemia: Secondary | ICD-10-CM | POA: Diagnosis present

## 2011-06-28 DIAGNOSIS — T82898A Other specified complication of vascular prosthetic devices, implants and grafts, initial encounter: Secondary | ICD-10-CM

## 2011-06-28 DIAGNOSIS — E1029 Type 1 diabetes mellitus with other diabetic kidney complication: Secondary | ICD-10-CM | POA: Diagnosis present

## 2011-06-28 DIAGNOSIS — J9600 Acute respiratory failure, unspecified whether with hypoxia or hypercapnia: Secondary | ICD-10-CM | POA: Diagnosis not present

## 2011-06-28 DIAGNOSIS — E11319 Type 2 diabetes mellitus with unspecified diabetic retinopathy without macular edema: Secondary | ICD-10-CM | POA: Diagnosis present

## 2011-06-28 DIAGNOSIS — E1039 Type 1 diabetes mellitus with other diabetic ophthalmic complication: Secondary | ICD-10-CM | POA: Diagnosis present

## 2011-06-28 DIAGNOSIS — Z7901 Long term (current) use of anticoagulants: Secondary | ICD-10-CM

## 2011-06-28 DIAGNOSIS — N058 Unspecified nephritic syndrome with other morphologic changes: Secondary | ICD-10-CM | POA: Diagnosis present

## 2011-06-28 LAB — COMPREHENSIVE METABOLIC PANEL
ALT: 30 U/L (ref 0–35)
AST: 32 U/L (ref 0–37)
Albumin: 3.4 g/dL — ABNORMAL LOW (ref 3.5–5.2)
Alkaline Phosphatase: 139 U/L — ABNORMAL HIGH (ref 39–117)
BUN: 46 mg/dL — ABNORMAL HIGH (ref 6–23)
Potassium: 6.5 mEq/L (ref 3.5–5.1)
Sodium: 134 mEq/L — ABNORMAL LOW (ref 135–145)
Total Protein: 8.7 g/dL — ABNORMAL HIGH (ref 6.0–8.3)

## 2011-06-28 LAB — CBC
MCHC: 32.8 g/dL (ref 30.0–36.0)
Platelets: 247 10*3/uL (ref 150–400)
RDW: 13.7 % (ref 11.5–15.5)
WBC: 6.8 10*3/uL (ref 4.0–10.5)

## 2011-06-28 LAB — DIFFERENTIAL
Basophils Absolute: 0 10*3/uL (ref 0.0–0.1)
Basophils Relative: 0 % (ref 0–1)
Lymphocytes Relative: 7 % — ABNORMAL LOW (ref 12–46)
Neutro Abs: 6.1 10*3/uL (ref 1.7–7.7)
Neutrophils Relative %: 90 % — ABNORMAL HIGH (ref 43–77)

## 2011-06-28 LAB — PHOSPHORUS: Phosphorus: 9.7 mg/dL — ABNORMAL HIGH (ref 2.3–4.6)

## 2011-06-28 LAB — LIPASE, BLOOD: Lipase: 20 U/L (ref 11–59)

## 2011-06-28 MED ORDER — DEXTROSE 50 % IV SOLN
INTRAVENOUS | Status: AC
  Administered 2011-06-28: 50 mL
  Filled 2011-06-28: qty 50

## 2011-06-28 MED ORDER — SODIUM CHLORIDE 0.9 % IV BOLUS (SEPSIS)
1000.0000 mL | Freq: Once | INTRAVENOUS | Status: AC
  Administered 2011-06-28: 1000 mL via INTRAVENOUS

## 2011-06-28 MED ORDER — CLONIDINE HCL 0.1 MG/24HR TD PTWK
0.2000 mg | MEDICATED_PATCH | TRANSDERMAL | Status: DC
  Administered 2011-06-28: 0.2 mg via TRANSDERMAL

## 2011-06-28 MED ORDER — DEXTROSE 50 % IV SOLN: 25.0000 g | Freq: Once | INTRAVENOUS | Status: DC

## 2011-06-28 MED ORDER — SODIUM CHLORIDE 0.9 % IV SOLN
1.0000 g | Freq: Once | INTRAVENOUS | Status: AC
  Administered 2011-06-28: 1 g via INTRAVENOUS
  Filled 2011-06-28: qty 10

## 2011-06-28 MED ORDER — SODIUM BICARBONATE 8.4 % IV SOLN
50.0000 meq | Freq: Once | INTRAVENOUS | Status: AC
  Administered 2011-06-28: 50 meq via INTRAVENOUS
  Filled 2011-06-28: qty 50

## 2011-06-28 MED ORDER — HEPARIN SODIUM (PORCINE) 1000 UNIT/ML DIALYSIS
1000.0000 [IU] | INTRAMUSCULAR | Status: DC | PRN
  Administered 2011-06-29: 1000 [IU] via INTRAVENOUS_CENTRAL
  Filled 2011-06-28: qty 1

## 2011-06-28 MED ORDER — HEPARIN SODIUM (PORCINE) 1000 UNIT/ML DIALYSIS
100.0000 [IU]/kg | INTRAMUSCULAR | Status: DC | PRN
  Filled 2011-06-28: qty 7

## 2011-06-28 MED ORDER — CIPROFLOXACIN IN D5W 400 MG/200ML IV SOLN: 400.0000 mg | Freq: Once | INTRAVENOUS | Status: DC

## 2011-06-28 MED ORDER — HYDROMORPHONE HCL PF 1 MG/ML IJ SOLN
1.0000 mg | Freq: Once | INTRAMUSCULAR | Status: AC
  Administered 2011-06-28: 1 mg via INTRAVENOUS
  Filled 2011-06-28: qty 1

## 2011-06-28 MED ORDER — CLONIDINE HCL 0.2 MG/24HR TD PTWK
MEDICATED_PATCH | TRANSDERMAL | Status: AC
  Filled 2011-06-28: qty 1

## 2011-06-28 MED ORDER — SODIUM CHLORIDE 0.9 % IV SOLN
Freq: Once | INTRAVENOUS | Status: AC
  Administered 2011-06-28: 22:00:00 via INTRAVENOUS

## 2011-06-28 MED ORDER — INSULIN ASPART 100 UNIT/ML ~~LOC~~ SOLN
SUBCUTANEOUS | Status: AC
  Filled 2011-06-28: qty 3

## 2011-06-28 MED ORDER — ONDANSETRON HCL 4 MG/2ML IJ SOLN
4.0000 mg | Freq: Once | INTRAMUSCULAR | Status: AC
  Administered 2011-06-28: 4 mg via INTRAVENOUS
  Filled 2011-06-28: qty 2

## 2011-06-28 MED ORDER — INSULIN ASPART 100 UNIT/ML IV SOLN: 10.0000 [IU] | Freq: Once | INTRAVENOUS | Status: DC

## 2011-06-28 MED ORDER — METRONIDAZOLE IN NACL 5-0.79 MG/ML-% IV SOLN: 500.0000 mg | Freq: Once | INTRAVENOUS | Status: DC

## 2011-06-28 MED ORDER — IOHEXOL 300 MG/ML  SOLN
50.0000 mL | Freq: Once | INTRAMUSCULAR | Status: AC | PRN
  Administered 2011-06-28: 50 mL via INTRAVENOUS

## 2011-06-28 MED ORDER — INSULIN ASPART 100 UNIT/ML ~~LOC~~ SOLN
SUBCUTANEOUS | Status: AC
  Administered 2011-06-28: 10 [IU]
  Filled 2011-06-28: qty 1

## 2011-06-28 MED ORDER — METOCLOPRAMIDE HCL 5 MG/ML IJ SOLN
10.0000 mg | Freq: Once | INTRAMUSCULAR | Status: AC
  Administered 2011-06-28: 10 mg via INTRAVENOUS
  Filled 2011-06-28: qty 2

## 2011-06-28 MED ORDER — HYDROMORPHONE HCL PF 1 MG/ML IJ SOLN
1.0000 mg | Freq: Once | INTRAMUSCULAR | Status: AC
  Administered 2011-07-07: 1 mg via INTRAVENOUS
  Filled 2011-06-28 (×6): qty 1

## 2011-06-28 NOTE — ED Notes (Signed)
Pt arrived via ems from home d/t nvd and abd pain. Pt states sx have been since 2 days ago. Pt also reports fever.

## 2011-06-28 NOTE — ED Notes (Signed)
CRITICAL VALUE ALERT  Critical value received:  K+ 6.5  Date of notification:  06/28/11  Time of notification:  2140  Critical value read back:yes  Nurse who received alert:  Eustace Quail RN  MD notified (1st page):  Dr Preston Fleeting  Time of first page:  2142  MD notified (2nd page):  Time of second page:  Responding MD:    Time MD responded:  2143

## 2011-06-28 NOTE — ED Provider Notes (Addendum)
History     CSN: [id number]  Arrival date & time 06/28/11  1958   First MD Initiated Contact with Patient 06/28/11 2006      Chief Complaint  Patient presents with  . Emesis  . Abdominal Pain    (Consider location/radiation/quality/duration/timing/severity/associated sxs/prior treatment) Patient is a 26 y.o. female presenting with vomiting and abdominal pain. The history is provided by the patient.  Emesis  Associated symptoms include abdominal pain.  Abdominal Pain The primary symptoms of the illness include abdominal pain and vomiting.  She has been having generalized abdominal pain along with vomiting and diarrhea for the last 2 days. She has had fevers at home but did not take her temperature. At the same time, she developed painful swollen areas in both axillae. Nothing makes her pain better nothing makes it worse. She's not taken any medication at home for. She denies any sick contacts. Pain is severe and she rates it at 10/10.  Past Medical History  Diagnosis Date  . Diabetes mellitus     insulin dependent diabetes mellitus with history of poor control  . Hypertension   . Hyperlipidemia   . Nephrotic syndrome   . Anemia     chronic,  Dr. Mariel Sleet,  Transfused, May, 2012  . 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 Hypertrophic cardiomyopathy     with diastolic heart dysfunction  . 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 Vaginosis   . History of MRSA infection     multiple  . Glaucoma   . Warfarin anticoagulation     DVT  . Ejection fraction     55%, echo 12/2009  . Manic depressive disorder   . Noncompliance   . port-a-cath   . Dialysis patient     M-W-F    Past Surgical History  Procedure Date  . Left arm cyst removal   . Incise and drain abcess 04/24/11    for multiple MRSA -       March 2013 drain port of abcess  . Placement of port a cath     poor venous access  . Av fistula placement   .  Hematoma evacuation 05/25/2011    Procedure: EVACUATION HEMATOMA;  Surgeon: Sherren Kerns, MD;  Location: [facility] OR;  Service: Vascular;  Laterality: Right;  Evacuation Hematoma Right Arm    Family History  Problem Relation Age of Onset  . Diabetes Mother   . Hypertension Mother   . Diabetes Father     History  Substance Use Topics  . Smoking status: Never Smoker   . Smokeless tobacco: Not on file  . Alcohol Use: No    OB History    Grav Para Term Preterm Abortions TAB SAB Ect Mult Living                  Review of Systems  Gastrointestinal: Positive for vomiting and abdominal pain.  All other systems reviewed and are negative.    Allergies  Ancef; Coumadin; Morphine and related; Sulfa antibiotics; and Tomato  Home Medications   Current Outpatient Rx  Name Route Sig Dispense Refill  . ALPRAZOLAM 0.5 MG PO TABS Oral Take 0.5 mg by mouth 2 (two) times daily as needed. For nerves/anxiety    . AMLODIPINE BESYLATE 10 MG PO TABS Oral Take 10 mg by mouth at bedtime.     Marland Kitchen CLONIDINE HCL 0.3 MG/24HR TD PTWK Transdermal  Place 1 patch onto the skin once a week. Applies new patch on Monday    . INSULIN GLARGINE 100 UNIT/ML [place] SOLN Subcutaneous Inject 25 Units into the skin at bedtime.     . INSULIN LISPRO (HUMAN) 100 UNIT/ML [place] SOLN Subcutaneous Inject 5-20 Units into the skin 3 (three) times daily before meals. Per sliding scale. Pt takes anywhere from 5 to 20 units.    Marland Kitchen LABETALOL HCL 300 MG PO TABS Oral Take 600 mg by mouth 2 (two) times daily.     Marland Kitchen LEVETIRACETAM 500 MG PO TABS Oral Take 500 mg by mouth 2 (two) times daily.    Marland Kitchen RENA-VITE PO TABS Oral Take 1 tablet by mouth daily.    . SERTRALINE HCL 50 MG PO TABS Oral Take 50 mg by mouth daily.     Marland Kitchen SEVELAMER CARBONATE 800 MG PO TABS Oral Take 1,600-3,200 mg by mouth 5 (five) times daily. Pt takes 4 tabs with meals and 2 tabs with snacks      BP 169/118  Pulse 133  Temp(Src) 100.8 F (38.2 C) (Oral)  Resp 18  Wt 137 lb  12.6 oz (62.5 kg)  SpO2 100%  Physical Exam  Nursing note and vitals reviewed.  26 year old female who is ill-appearing. Vital signs are significant for low-grade fever with temperature 100.8, significant tachycardia with heart rate 133, with moderate hypertension with blood pressure 169/118. Oxygen saturation is 100% which is normal. Head is normocephalic and atraumatic. PERRLA, EOMI. Oropharynx is clear. Neck is nontender and supple. Back is nontender. There's no CVA tenderness. Lungs are clear without rales, wheezes, rhonchi. Heart is tachycardic and regular without murmur. There is no chest wall tenderness. Dialysis access catheters are present in the right subclavian area. Abdomen is soft but tender diffusely without rebound or guarding. Peristalsis is decreased. Extremities have full range of motion, no cyanosis or edema. There are 2 tender nodules in each axilla each measuring about 1 cm in diameter. It is unclear to me whether these are enlarged lymph nodes or small abscess use. Skin is warm and diaphoretic without rash. Neurologic: She is awake, alert, oriented x3. Cranial nerves are intact. There are no motor or sensory deficits.  ED Course  Procedures (including critical care time)  Results for orders placed during the hospital encounter of 06/28/11  CBC      Component Value Range   WBC 6.8  4.0 - 10.5 (K/uL)   RBC 3.86 (*) 3.87 - 5.11 (MIL/uL)   Hemoglobin 12.0  12.0 - 15.0 (g/dL)   HCT 79.0  24.0 - 97.3 (%)   MCV 94.8  78.0 - 100.0 (fL)   MCH 31.1  26.0 - 34.0 (pg)   MCHC 32.8  30.0 - 36.0 (g/dL)   RDW 53.2  99.2 - 42.6 (%)   Platelets 247  150 - 400 (K/uL)  DIFFERENTIAL      Component Value Range   Neutrophils Relative 90 (*) 43 - 77 (%)   Neutro Abs 6.1  1.7 - 7.7 (K/uL)   Lymphocytes Relative 7 (*) 12 - 46 (%)   Lymphs Abs 0.5 (*) 0.7 - 4.0 (K/uL)   Monocytes Relative 3  3 - 12 (%)   Monocytes Absolute 0.2  0.1 - 1.0 (K/uL)   Eosinophils Relative 1  0 - 5 (%)    Eosinophils Absolute 0.0  0.0 - 0.7 (K/uL)   Basophils Relative 0  0 - 1 (%)   Basophils Absolute 0.0  0.0 - 0.1 (  K/uL)  LIPASE, BLOOD      Component Value Range   Lipase 20  11 - 59 (U/L)  COMPREHENSIVE METABOLIC PANEL      Component Value Range   Sodium 134 (*) 135 - 145 (mEq/L)   Potassium 6.5 (*) 3.5 - 5.1 (mEq/L)   Chloride 97  96 - 112 (mEq/L)   CO2 18 (*) 19 - 32 (mEq/L)   Glucose, Bld 136 (*) 70 - 99 (mg/dL)   BUN 46 (*) 6 - 23 (mg/dL)   Creatinine, Ser 1.61 (*) 0.50 - 1.10 (mg/dL)   Calcium 09.6  8.4 - 10.5 (mg/dL)   Total Protein 8.7 (*) 6.0 - 8.3 (g/dL)   Albumin 3.4 (*) 3.5 - 5.2 (g/dL)   AST 32  0 - 37 (U/L)   ALT 30  0 - 35 (U/L)   Alkaline Phosphatase 139 (*) 39 - 117 (U/L)   Total Bilirubin 0.2 (*) 0.3 - 1.2 (mg/dL)   GFR calc non Af Amer 6 (*) >90 (mL/min)   GFR calc Af Amer 7 (*) >90 (mL/min)  PHOSPHORUS      Component Value Range   Phosphorus 9.7 (*) 2.3 - 4.6 (mg/dL)  GLUCOSE, CAPILLARY      Component Value Range   Glucose-Capillary 57 (*) 70 - 99 (mg/dL)  GLUCOSE, CAPILLARY      Component Value Range   Glucose-Capillary 99  70 - 99 (mg/dL)  MRSA PCR SCREENING      Component Value Range   MRSA by PCR POSITIVE (*) NEGATIVE   CLOSTRIDIUM DIFFICILE BY PCR      Component Value Range   C difficile by pcr NEGATIVE  NEGATIVE   HEPATITIS B SURFACE ANTIGEN      Component Value Range   Hepatitis B Surface Ag NEGATIVE  NEGATIVE   GLUCOSE, CAPILLARY      Component Value Range   Glucose-Capillary 104 (*) 70 - 99 (mg/dL)  CBC      Component Value Range   WBC 6.2  4.0 - 10.5 (K/uL)   RBC 3.61 (*) 3.87 - 5.11 (MIL/uL)   Hemoglobin 11.3 (*) 12.0 - 15.0 (g/dL)   HCT 04.5 (*) 40.9 - 46.0 (%)   MCV 94.5  78.0 - 100.0 (fL)   MCH 31.3  26.0 - 34.0 (pg)   MCHC 33.1  30.0 - 36.0 (g/dL)   RDW 81.1  91.4 - 78.2 (%)   Platelets 182  150 - 400 (K/uL)  BASIC METABOLIC PANEL      Component Value Range   Sodium 136  135 - 145 (mEq/L)   Potassium 4.2  3.5 - 5.1 (mEq/L)     Chloride 95 (*) 96 - 112 (mEq/L)   CO2 26  19 - 32 (mEq/L)   Glucose, Bld 97  70 - 99 (mg/dL)   BUN 12  6 - 23 (mg/dL)   Creatinine, Ser 9.56 (*) 0.50 - 1.10 (mg/dL)   Calcium 9.5  8.4 - 21.3 (mg/dL)   GFR calc non Af Amer 17 (*) >90 (mL/min)   GFR calc Af Amer 19 (*) >90 (mL/min)  HEPATITIS B CORE ANTIBODY, IGM      Component Value Range   Hep B C IgM NEGATIVE  NEGATIVE   GLUCOSE, CAPILLARY      Component Value Range   Glucose-Capillary 102 (*) 70 - 99 (mg/dL)  CBC      Component Value Range   WBC 6.5  4.0 - 10.5 (K/uL)   RBC 3.18 (*) 3.87 -  5.11 (MIL/uL)   Hemoglobin 10.0 (*) 12.0 - 15.0 (g/dL)   HCT 16.1 (*) 09.6 - 46.0 (%)   MCV 95.6  78.0 - 100.0 (fL)   MCH 31.4  26.0 - 34.0 (pg)   MCHC 32.9  30.0 - 36.0 (g/dL)   RDW 04.5  40.9 - 81.1 (%)   Platelets 203  150 - 400 (K/uL)  COMPREHENSIVE METABOLIC PANEL      Component Value Range   Sodium 130 (*) 135 - 145 (mEq/L)   Potassium 4.8  3.5 - 5.1 (mEq/L)   Chloride 91 (*) 96 - 112 (mEq/L)   CO2 25  19 - 32 (mEq/L)   Glucose, Bld 121 (*) 70 - 99 (mg/dL)   BUN 29 (*) 6 - 23 (mg/dL)   Creatinine, Ser 9.14 (*) 0.50 - 1.10 (mg/dL)   Calcium 9.1  8.4 - 78.2 (mg/dL)   Total Protein 7.1  6.0 - 8.3 (g/dL)   Albumin 2.7 (*) 3.5 - 5.2 (g/dL)   AST 69 (*) 0 - 37 (U/L)   ALT 33  0 - 35 (U/L)   Alkaline Phosphatase 131 (*) 39 - 117 (U/L)   Total Bilirubin 0.2 (*) 0.3 - 1.2 (mg/dL)   GFR calc non Af Amer 9 (*) >90 (mL/min)   GFR calc Af Amer 10 (*) >90 (mL/min)  PHOSPHORUS      Component Value Range   Phosphorus 7.1 (*) 2.3 - 4.6 (mg/dL)  CULTURE, BLOOD (SINGLE)      Component Value Range   Specimen Description Blood PORTA CATH     Special Requests       Value: BOTTLES DRAWN AEROBIC AND ANAEROBIC 7CC DRAWN BY RN   Culture  Setup Time PENDING     Culture       Value: GRAM POSITIVE COCCI IN CLUSTERS     Gram Stain Report Called to,Read Back By and Verified With: JARRELL W. AT 12PM ON [id number] BY THOMPSON S.   Report Status  PENDING    GLUCOSE, CAPILLARY      Component Value Range   Glucose-Capillary 143 (*) 70 - 99 (mg/dL)   Comment 1 Documented in Chart     Comment 2 Notify RN    GLUCOSE, CAPILLARY      Component Value Range   Glucose-Capillary 165 (*) 70 - 99 (mg/dL)   Comment 1 Notify RN     Comment 2 Documented in Chart    GLUCOSE, CAPILLARY      Component Value Range   Glucose-Capillary 131 (*) 70 - 99 (mg/dL)   Comment 1 Documented in Chart     Comment 2 Notify RN    GLUCOSE, CAPILLARY      Component Value Range   Glucose-Capillary 122 (*) 70 - 99 (mg/dL)   Comment 1 Documented in Chart     Comment 2 Notify RN    GLUCOSE, CAPILLARY      Component Value Range   Glucose-Capillary 119 (*) 70 - 99 (mg/dL)   Comment 1 Documented in Chart     Comment 2 Notify RN    GLUCOSE, CAPILLARY      Component Value Range   Glucose-Capillary 104 (*) 70 - 99 (mg/dL)   Comment 1 Notify RN     Comment 2 Documented in Chart    GLUCOSE, CAPILLARY      Component Value Range   Glucose-Capillary 139 (*) 70 - 99 (mg/dL)   Comment 1 Notify RN     Comment 2 Documented in  Chart    GLUCOSE, CAPILLARY      Component Value Range   Glucose-Capillary 121 (*) 70 - 99 (mg/dL)   Comment 1 Documented in Chart     Comment 2 Notify RN    GLUCOSE, CAPILLARY      Component Value Range   Glucose-Capillary 75  70 - 99 (mg/dL)   Comment 1 Notify RN     Dg Chest 1 View  06/29/2011  *RADIOLOGY REPORT*  Clinical Data: Fever, abdominal pain, and vomiting.  Dialysis patient.  CHEST - 1 VIEW  Comparison: 05/14/2011  Findings: Stable appearance of bilateral central venous catheters. Shallow inspiration.  Mild cardiac enlargement with borderline pulmonary vascularity.  No blunting of costophrenic angles.  No focal airspace consolidation.  No pneumothorax.  No significant changes since the previous study.  IMPRESSION: No evidence of active pulmonary disease.  Mild cardiac enlargement.  Original Report Authenticated By: Marlon Pel, M.D.   Ct Abdomen Pelvis W Contrast  06/30/2011  *RADIOLOGY REPORT*  Clinical Data: Abdominal pain and fever.  End-stage renal disease.  CT ABDOMEN AND PELVIS WITH CONTRAST  Technique:  Multidetector CT imaging of the abdomen and pelvis was performed following the standard protocol during bolus administration of intravenous contrast.  Contrast: OMNIPAQUE IOHEXOL 300 MG/ML  SOLN  Comparison: 06/28/2011  Findings: There is prominent hepatomegaly.  No focal lesions in the liver.  No dilated bile ducts.  Gallbladder is normal.  The spleen, pancreas, adrenal glands, and kidneys are normal. Terminal ileum and appendix are normal.  Small bowel is normal.  Again noted is a 3.7 cm cyst on the right ovary, unchanged.  Uterus is normal.  Left ovary is not discretely identified.  There is slight prominence of the mucosa of the distal colon but the colon is collapsed.  There is no pericolonic inflammation.  No acute osseous abnormalities.  IMPRESSION:  1.  Hepatomegaly. 2.  Stable cyst on the right ovary. 3.  Slight prominence of the mucosa of the distal colon. Distal colon is decompressed in this may be purely be due to the lack of distention but colitis should be considered.  Original Report Authenticated By: Gwynn Burly, M.D.   Ct Abdomen Pelvis W Contrast  06/28/2011  *RADIOLOGY REPORT*  Clinical Data: Abdominal pain, vomiting, on dialysis  CT ABDOMEN AND PELVIS WITH CONTRAST  Technique:  Multidetector CT imaging of the abdomen and pelvis was performed following the standard protocol during bolus administration of intravenous contrast.  Contrast: 50mL OMNIPAQUE IOHEXOL 300 MG/ML  SOLN  Comparison: [place] CT abdomen pelvis dated 06/18/2011  Findings: Very mild patchy opacities at the lung bases, likely atelectasis / scarring.  Liver, spleen, pancreas, and adrenal glands are within normal limits.  Gallbladder is unremarkable.  No intrahepatic or extrahepatic ductal dilatation.  Kidneys are  unremarkable.  No hydronephrosis.  No evidence of bowel obstruction.  Normal appendix.  Thickening of the sigmoid colon, suspicious for colitis.  The appearance was more pronounced on the recent prior study.  No evidence of abdominal aortic aneurysm.  No abdominopelvic ascites.  No suspicious abdominopelvic lymphadenopathy.  Uterus is unremarkable.  4.1 x 3.1 cm cystic lesion in the right ovary, likely physiologic given age.  Bladder is mildly thick-walled, correlate for cystitis.  Visualized osseous structures are within normal limits.  IMPRESSION: Normal appendix.  No evidence of bowel obstruction.  Thickening of the sigmoid colon, suspicious for colitis.  Thick-walled bladder, correlate for cystitis.  4.1 x 3.1 cm right ovarian cyst,  likely physiologic given age. Follow-up pelvic ultrasound is suggested in 6-10 weeks.  Original Report Authenticated By: Charline Bills, M.D.      Date: 06/28/2011  Rate: 123  Rhythm: sinus tachycardia  QRS Axis: right  Intervals: normal  ST/T Wave abnormalities: normal  Conduction Disutrbances:none  Narrative Interpretation: Sinus tachycardia with right axis deviation. When compared with ECG of April 90,013, no significant changes are seen but rate is faster.  Old EKG Reviewed: unchanged    1. Colitis   2. Hyperkalemia   3. End stage renal disease   4. Abdominal pain   5. Fever     CRITICAL CARE Performed by: YNWGN,FAOZH   Total critical care time: 45 minutes  Critical care time was exclusive of separately billable procedures and treating other patients.  Critical care was necessary to treat or prevent imminent or life-threatening deterioration.  Critical care was time spent personally by me on the following activities: development of treatment plan with patient and/or surrogate as well as nursing, discussions with consultants, evaluation of patient's response to treatment, examination of patient, obtaining history from patient or surrogate,  ordering and performing treatments and interventions, ordering and review of laboratory studies, ordering and review of radiographic studies, pulse oximetry and re-evaluation of patient's condition.   MDM  Abdominal pain with vomiting and diarrhea. She shows evidence of dehydration with marked tachycardia. CT scan will be obtained to rule out serious intra-abdominal pathology. I am uncertain about her axillary nodules whether they are reactive lymph nodes or abscess use. There is no erythema the skin to indicate hidradenitis up or tea the. No inguinal lymph nodes are palpable.  She feels slightly better after IV fluids and IV Dilaudid and Zofran. Laboratory workup is significant for hyperkalemia. This is treated with calcium, sodium bicarbonate, glucose and insulin. She had missed her dialysis today and was last dialyzed 2 days ago. She will need emergent dialysis for hyperkalemia. CT scan is still pending at this point.  Case is discussed with Dr. [name] who will make arrangements for emergent dialysis.     Dione Booze, MD 06/30/11 2250  Dione Booze, MD 06/30/11 2255

## 2011-06-28 NOTE — ED Provider Notes (Signed)
No surgical pathology on CT.  Dr [name] from nephrology arranged emergent HD for the patient.  Will admit patient to hospitalist service for further treatment of sx.  Treated for colitis with cipro and flagyl.  Admitted to ICU  Dayton Bailiff, MD 06/28/11 2352

## 2011-06-28 NOTE — H&P (Signed)
PCP:   HASANAJ,XAJE A, MD, MD   Chief Complaint:  Nausea vomiting abdominal pain for 2 days with diarrhea  HPI: 26 year old female with end-stage renal disease dialysis dependent who presents to the emergency department with 2 days of nausea vomiting diarrhea and abdominal pain. Because of her acute illness today she missed dialysis today comes in with a high potassium level and is currently getting emergent dialysis in the ICU. She states that this abdominal pain is worse than what it normally is and her vomiting is worse. Her history is somewhat unreliable and inconsistent. She says she's been running fevers. She denies any sick contacts.  Review of Systems:  Otherwise negative  Past Medical History: Past Medical History  Diagnosis Date  . Diabetes mellitus     insulin dependent diabetes mellitus with history of poor control  . Hypertension   . Hyperlipidemia   . Nephrotic syndrome   . Anemia     chronic,  Dr. Mariel Sleet,  Transfused, May, 2012  . 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 Hypertrophic cardiomyopathy     with diastolic heart dysfunction  . 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 Vaginosis   . History of MRSA infection     multiple  . Glaucoma   . Warfarin anticoagulation     DVT  . Ejection fraction     55%, echo 12/2009  . Manic depressive disorder   . Noncompliance   . port-a-cath   . Dialysis patient     M-W-F   Past Surgical History  Procedure Date  . Left arm cyst removal   . Incise and drain abcess 04/24/11    for multiple MRSA -       March 2013 drain port of abcess  . Placement of port a cath     poor venous access  . Av fistula placement   . Hematoma evacuation 05/25/2011    Procedure: EVACUATION HEMATOMA;  Surgeon: Sherren Kerns, MD;  Location: [facility] OR;  Service: Vascular;  Laterality: Right;  Evacuation Hematoma Right Arm    Medications: Prior to Admission medications     Medication Sig Start Date End Date Taking? Authorizing Provider  amLODipine (NORVASC) 10 MG tablet Take 10 mg by mouth at bedtime.    Yes Historical Provider, MD  FOSRENOL 1000 MG chewable tablet Chew 1 tablet by mouth 3 (three) times daily with meals. 05/17/11  Yes Historical Provider, MD  insulin glargine (LANTUS) 100 UNIT/ML injection Inject 25 Units into the skin at bedtime.    Yes Historical Provider, MD  insulin lispro (HUMALOG) 100 UNIT/ML injection Inject 5-20 Units into the skin 3 (three) times daily before meals. Per sliding scale. Pt takes anywhere from 5 to 20 units.   Yes Historical Provider, MD  labetalol (NORMODYNE) 300 MG tablet Take 600 mg by mouth 2 (two) times daily.    Yes Historical Provider, MD  levETIRAcetam (KEPPRA) 500 MG tablet Take 500 mg by mouth 2 (two) times daily.   Yes Historical Provider, MD  multivitamin (RENA-VIT) TABS tablet Take 1 tablet by mouth daily.   Yes Historical Provider, MD  ALPRAZolam Prudy Feeler) 0.5 MG tablet Take 0.5 mg by mouth 2 (two) times daily as needed. For nerves/anxiety    Historical Provider, MD  cloNIDine (CATAPRES - DOSED IN MG/24 HR) 0.3 mg/24hr Place 1 patch onto the skin once a week. Applies new patch on Monday  Historical Provider, MD  sertraline (ZOLOFT) 50 MG tablet Take 50 mg by mouth daily as needed.     Historical Provider, MD    Allergies:   Allergies  Allergen Reactions  . Ancef (Cefazolin Sodium) Shortness Of Breath    wheezing  . Coumadin (Warfarin Sodium)     Eyes bleed  . Morphine And Related Itching  . Sulfa Antibiotics Anaphylaxis and Shortness Of Breath  . Tomato Hives and Swelling    Social History:  reports that she has never smoked. She does not have any smokeless tobacco history on file. She reports that she does not drink alcohol or use illicit drugs.  Family History: Family History  Problem Relation Age of Onset  . Diabetes Mother   . Hypertension Mother   . Diabetes Father     Physical  Exam: Filed Vitals:   06/28/11 2002 06/28/11 2130  BP: 169/118 166/113  Pulse: 133 123  Temp: 100.8 F (38.2 C) 98.8 F (37.1 C)  TempSrc: Oral Oral  Resp: 18 20  Weight: 62.5 kg (137 lb 12.6 oz)   SpO2: 100% 99%   BP 149/95  Pulse 112  Temp(Src) 98.8 F (37.1 C) (Oral)  Resp 18  Wt 62.5 kg (137 lb 12.6 oz)  SpO2 98% General appearance: alert, cooperative and no distress Lungs: clear to auscultation bilaterally Heart: regular rate and rhythm, S1, S2 normal, no murmur, click, rub or gallop Abdomen: nondistended, soft, ttp diffuse mild no r/g pos bs nonacute abd Extremities: extremities normal, atraumatic, no cyanosis or edema Pulses: 2+ and symmetric Skin: Skin color, texture, turgor normal. No rashes or lesions Neurologic: Grossly normal    Labs on Admission:   [facility] 06/28/11 2032  NA 134*  K 6.5*  CL 97  CO2 18*  GLUCOSE 136*  BUN 46*  CREATININE 8.36*  CALCIUM 10.5  MG --  PHOS 9.7*    Basename 06/28/11 2032  AST 32  ALT 30  ALKPHOS 139*  BILITOT 0.2*  PROT 8.7*  ALBUMIN 3.4*    Basename 06/28/11 2032  LIPASE 20  AMYLASE --    Basename 06/28/11 2032  WBC 6.8  NEUTROABS 6.1  HGB 12.0  HCT 36.6  MCV 94.8  PLT 247    Radiological Exams on Admission: Ct Abdomen Pelvis W Contrast  06/28/2011  *RADIOLOGY REPORT*  Clinical Data: Abdominal pain, vomiting, on dialysis  CT ABDOMEN AND PELVIS WITH CONTRAST  Technique:  Multidetector CT imaging of the abdomen and pelvis was performed following the standard protocol during bolus administration of intravenous contrast.  Contrast: 50mL OMNIPAQUE IOHEXOL 300 MG/ML  SOLN  Comparison: [facility] CT abdomen pelvis dated 06/18/2011  Findings: Very mild patchy opacities at the lung bases, likely atelectasis / scarring.  Liver, spleen, pancreas, and adrenal glands are within normal limits.  Gallbladder is unremarkable.  No intrahepatic or extrahepatic ductal dilatation.  Kidneys are unremarkable.  No  hydronephrosis.  No evidence of bowel obstruction.  Normal appendix.  Thickening of the sigmoid colon, suspicious for colitis.  The appearance was more pronounced on the recent prior study.  No evidence of abdominal aortic aneurysm.  No abdominopelvic ascites.  No suspicious abdominopelvic lymphadenopathy.  Uterus is unremarkable.  4.1 x 3.1 cm cystic lesion in the right ovary, likely physiologic given age.  Bladder is mildly thick-walled, correlate for cystitis.  Visualized osseous structures are within normal limits.  IMPRESSION: Normal appendix.  No evidence of bowel obstruction.  Thickening of the sigmoid colon, suspicious for colitis.  Thick-walled  bladder, correlate for cystitis.  4.1 x 3.1 cm right ovarian cyst, likely physiologic given age. Follow-up pelvic ultrasound is suggested in 6-10 weeks.  Original Report Authenticated By: Charline Bills, M.D.    Assessment/Plan Present on Admission: 26 year old female with probable acute colitis  .Nausea & vomiting .Abdominal pain .Colitis, acute .Hyperkalemia .End stage renal disease  CT scan shows some changes consistent with possible colitis. Full obtain stool culture and rule her out for C. difficile and placed on ciprofloxacin and Flagyl. She is emergently getting dialysis right now for her hyperkalemia and nephrology has already seen her in the emergency department. Provide her with some Zofran and pain medications for her nausea vomiting and abdominal pain. Further recommendations. On response to the above keep her n.p.o.  Yanelly Cantrelle A [phone] 06/28/2011, 11:52 PM

## 2011-06-28 NOTE — Consult Note (Signed)
Reason for Consult: Hyperkalemia and end-stage renal disease Referring Physician: The hospitalist group  [name] is an 26 y.o. female.  HPI: Her she is a patient was history of diabetes, diabetic gastropathy and end-stage renal disease on maintenance hemodialysis her presently had came to the emergency room after missing dialysis with the complaints of nausea vomiting and also fever. [facility] was a [facility] about a week ago for similar problem. During that time possible acute pancreatitis and diabetic gastropathy was entertained patient improved discharge home to come back with similar problem. This morning the driver went to pick her up but he couldn't find the patient at home. Hence patient missed her dialysis. Patient is very noncompliant with her medication and also his diet.  Past Medical History  Diagnosis Date  . Diabetes mellitus     insulin dependent diabetes mellitus with history of poor control  . Hypertension   . Hyperlipidemia   . Nephrotic syndrome   . Anemia     chronic,  Dr. Mariel Sleet,  Transfused, May, 2012  . 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 Hypertrophic cardiomyopathy     with diastolic heart dysfunction  . 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 Vaginosis   . History of MRSA infection     multiple  . Glaucoma   . Warfarin anticoagulation     DVT  . Ejection fraction     55%, echo 12/2009  . Manic depressive disorder   . Noncompliance   . port-a-cath   . Dialysis patient     M-W-F    Past Surgical History  Procedure Date  . Left arm cyst removal   . Incise and drain abcess 04/24/11    for multiple MRSA -       March 2013 drain port of abcess  . Placement of port a cath     poor venous access  . Av fistula placement   . Hematoma evacuation 05/25/2011    Procedure: EVACUATION HEMATOMA;  Surgeon: Sherren Kerns, MD;  Location: [facility] OR;  Service: Vascular;  Laterality: Right;   Evacuation Hematoma Right Arm    Family History  Problem Relation Age of Onset  . Diabetes Mother   . Hypertension Mother   . Diabetes Father     Social History:  reports that she has never smoked. She does not have any smokeless tobacco history on file. She reports that she does not drink alcohol or use illicit drugs.  Allergies:  Allergies  Allergen Reactions  . Ancef (Cefazolin Sodium) Shortness Of Breath    wheezing  . Coumadin (Warfarin Sodium)     Eyes bleed  . Morphine And Related Itching  . Sulfa Antibiotics Anaphylaxis and Shortness Of Breath  . Tomato Hives and Swelling    Medications: I have reviewed the patient's current medications.  Results for orders placed during the hospital encounter of 06/28/11 (from the past 48 hour(s))  CBC     Status: Abnormal   Collection Time   06/28/11  8:32 PM      Component Value Range Comment   WBC 6.8  4.0 - 10.5 (K/uL)    RBC 3.86 (*) 3.87 - 5.11 (MIL/uL)    Hemoglobin 12.0  12.0 - 15.0 (g/dL)    HCT 16.1  09.6 - 04.5 (%)    MCV 94.8  78.0 - 100.0 (fL)    MCH 31.1  26.0 -  34.0 (pg)    MCHC 32.8  30.0 - 36.0 (g/dL)    RDW 91.4  78.2 - 95.6 (%)    Platelets 247  150 - 400 (K/uL)   DIFFERENTIAL     Status: Abnormal   Collection Time   06/28/11  8:32 PM      Component Value Range Comment   Neutrophils Relative 90 (*) 43 - 77 (%)    Neutro Abs 6.1  1.7 - 7.7 (K/uL)    Lymphocytes Relative 7 (*) 12 - 46 (%)    Lymphs Abs 0.5 (*) 0.7 - 4.0 (K/uL)    Monocytes Relative 3  3 - 12 (%)    Monocytes Absolute 0.2  0.1 - 1.0 (K/uL)    Eosinophils Relative 1  0 - 5 (%)    Eosinophils Absolute 0.0  0.0 - 0.7 (K/uL)    Basophils Relative 0  0 - 1 (%)    Basophils Absolute 0.0  0.0 - 0.1 (K/uL)   LIPASE, BLOOD     Status: Normal   Collection Time   06/28/11  8:32 PM      Component Value Range Comment   Lipase 20  11 - 59 (U/L)   COMPREHENSIVE METABOLIC PANEL     Status: Abnormal   Collection Time   06/28/11  8:32 PM      Component  Value Range Comment   Sodium 134 (*) 135 - 145 (mEq/L)    Potassium 6.5 (*) 3.5 - 5.1 (mEq/L)    Chloride 97  96 - 112 (mEq/L)    CO2 18 (*) 19 - 32 (mEq/L)    Glucose, Bld 136 (*) 70 - 99 (mg/dL)    BUN 46 (*) 6 - 23 (mg/dL)    Creatinine, Ser 2.13 (*) 0.50 - 1.10 (mg/dL)    Calcium 08.6  8.4 - 10.5 (mg/dL)    Total Protein 8.7 (*) 6.0 - 8.3 (g/dL)    Albumin 3.4 (*) 3.5 - 5.2 (g/dL)    AST 32  0 - 37 (U/L)    ALT 30  0 - 35 (U/L)    Alkaline Phosphatase 139 (*) 39 - 117 (U/L)    Total Bilirubin 0.2 (*) 0.3 - 1.2 (mg/dL)    GFR calc non Af Amer 6 (*) >90 (mL/min)    GFR calc Af Amer 7 (*) >90 (mL/min)     No results found.  Review of Systems  Constitutional: Positive for fever and chills.  Respiratory: Positive for cough. Negative for shortness of breath and wheezing.   Cardiovascular: Negative for orthopnea and leg swelling.  Gastrointestinal: Positive for nausea, vomiting, abdominal pain and diarrhea.  Musculoskeletal: Positive for back pain.   Blood pressure 166/113, pulse 123, temperature 98.8 F (37.1 C), temperature source Oral, resp. rate 20, weight 62.5 kg (137 lb 12.6 oz), SpO2 99.00%. Physical Exam  Constitutional: She is oriented to person, place, and time. No distress.  Eyes: No scleral icterus.  Neck: Normal range of motion. Neck supple. No JVD present.  Cardiovascular: Normal rate, regular rhythm and intact distal pulses.   No murmur heard. Respiratory: Effort normal and breath sounds normal. No respiratory distress. She has no wheezes. She has no rales.  GI: Soft. Bowel sounds are normal. She exhibits no distension. There is tenderness. There is no rebound.  Musculoskeletal: She exhibits no edema.  Neurological: She is alert and oriented to person, place, and time.    Assessment/Plan: Problem #1 hyperkalemia potassium at this moment seems to be  high this is due to noncompliance with her diet. Patient was dialyzed on Wednesday. Problem #2 history of nausea  vomiting etiology as this moment not clear most likely secondary to diabetes gastropathy. He was a patient says that has she she had fever presently she is a febrile with normal white percent count. Problem #3 history of hypertension blood pressure seems to be somewhat high most likely because she was not taking her medication. Problem #4 diabetes she is on insulin Problem #5 history of anemia this secondary to end-stage renal disease patient is on Epogen as an outpatient. Problem #6 history of chronic back pain. Problem #7 history of diabetic retinopathy and bilateral. Problem #8 history of hypercalcemia. Plan: Patient advised not to miss her dialysis and to be compliant with her medications Make arrangements for patient to get dialysis and we'll use 2K 2.5 calcium bath. We'll put her on clonidine patch her blood pressure becomes very difficult to control Will also restart her labetalol 400 mg by mouth twice a day once the her nausea vomiting improves We'll check her CBC, basic metabolic panel phosphorus in the morning and we'll consider starting her on a binder.  Brucha Ahlquist S 06/28/2011, 10:24 PM

## 2011-06-29 ENCOUNTER — Inpatient Hospital Stay (HOSPITAL_COMMUNITY): Payer: Medicaid Other

## 2011-06-29 DIAGNOSIS — R109 Unspecified abdominal pain: Secondary | ICD-10-CM

## 2011-06-29 DIAGNOSIS — R112 Nausea with vomiting, unspecified: Secondary | ICD-10-CM

## 2011-06-29 DIAGNOSIS — N186 End stage renal disease: Secondary | ICD-10-CM

## 2011-06-29 LAB — CBC
Hemoglobin: 11.3 g/dL — ABNORMAL LOW (ref 12.0–15.0)
MCH: 31.3 pg (ref 26.0–34.0)
MCHC: 33.1 g/dL (ref 30.0–36.0)

## 2011-06-29 LAB — BASIC METABOLIC PANEL
BUN: 12 mg/dL (ref 6–23)
Calcium: 9.5 mg/dL (ref 8.4–10.5)
Creatinine, Ser: 3.57 mg/dL — ABNORMAL HIGH (ref 0.50–1.10)
GFR calc non Af Amer: 17 mL/min — ABNORMAL LOW (ref >90)
Glucose, Bld: 97 mg/dL (ref 70–99)
Sodium: 136 mEq/L (ref 135–145)

## 2011-06-29 LAB — GLUCOSE, CAPILLARY: Glucose-Capillary: 102 mg/dL — ABNORMAL HIGH (ref 70–99)

## 2011-06-29 MED ORDER — AMLODIPINE BESYLATE 5 MG PO TABS
5.0000 mg | ORAL_TABLET | Freq: Every day | ORAL | Status: DC
  Administered 2011-06-29 – 2011-07-04 (×6): 5 mg via ORAL
  Filled 2011-06-29 (×6): qty 1

## 2011-06-29 MED ORDER — SODIUM CHLORIDE 0.9 % IJ SOLN
3.0000 mL | Freq: Two times a day (BID) | INTRAMUSCULAR | Status: DC
  Administered 2011-06-30 – 2011-07-02 (×3): 3 mL via INTRAVENOUS
  Administered 2011-07-03: 17:00:00 via INTRAVENOUS
  Administered 2011-07-05 – 2011-07-09 (×4): 3 mL via INTRAVENOUS
  Filled 2011-06-29 (×5): qty 3

## 2011-06-29 MED ORDER — AMLODIPINE BESYLATE 5 MG PO TABS: 10.0000 mg | ORAL_TABLET | Freq: Every day | ORAL | Status: DC

## 2011-06-29 MED ORDER — METOPROLOL TARTRATE 25 MG PO TABS: 25.0000 mg | ORAL_TABLET | Freq: Two times a day (BID) | ORAL | Status: DC

## 2011-06-29 MED ORDER — LEVETIRACETAM 500 MG PO TABS
500.0000 mg | ORAL_TABLET | Freq: Two times a day (BID) | ORAL | Status: DC
  Administered 2011-06-29 – 2011-07-09 (×20): 500 mg via ORAL
  Filled 2011-06-29 (×21): qty 1

## 2011-06-29 MED ORDER — SODIUM CHLORIDE 0.9 % IJ SOLN
INTRAMUSCULAR | Status: AC
  Filled 2011-06-29: qty 3

## 2011-06-29 MED ORDER — SODIUM CHLORIDE 0.9 % IV SOLN: 250.0000 mL | INTRAVENOUS | Status: DC | PRN

## 2011-06-29 MED ORDER — SODIUM CHLORIDE 0.9 % IJ SOLN
3.0000 mL | INTRAMUSCULAR | Status: DC | PRN
  Administered 2011-07-03: 3 mL via INTRAVENOUS
  Filled 2011-06-29: qty 3

## 2011-06-29 MED ORDER — METRONIDAZOLE IN NACL 5-0.79 MG/ML-% IV SOLN
INTRAVENOUS | Status: AC
  Filled 2011-06-29: qty 100

## 2011-06-29 MED ORDER — CIPROFLOXACIN IN D5W 400 MG/200ML IV SOLN
400.0000 mg | INTRAVENOUS | Status: DC
  Administered 2011-06-29 – 2011-07-01 (×3): 400 mg via INTRAVENOUS
  Filled 2011-06-29 (×4): qty 200

## 2011-06-29 MED ORDER — INSULIN ASPART 100 UNIT/ML ~~LOC~~ SOLN
0.0000 [IU] | SUBCUTANEOUS | Status: DC
  Administered 2011-06-29: 2 [IU] via SUBCUTANEOUS
  Administered 2011-06-29 – 2011-06-30 (×4): 1 [IU] via SUBCUTANEOUS
  Administered 2011-07-02 – 2011-07-03 (×6): 2 [IU] via SUBCUTANEOUS
  Administered 2011-07-04: 1 [IU] via SUBCUTANEOUS
  Administered 2011-07-04: 5 [IU] via SUBCUTANEOUS
  Administered 2011-07-04: 3 [IU] via SUBCUTANEOUS
  Administered 2011-07-04: 2 [IU] via SUBCUTANEOUS
  Administered 2011-07-04: 1 [IU] via SUBCUTANEOUS
  Administered 2011-07-04: 2 [IU] via SUBCUTANEOUS
  Administered 2011-07-05: 5 [IU] via SUBCUTANEOUS
  Administered 2011-07-05 (×4): 2 [IU] via SUBCUTANEOUS
  Administered 2011-07-06: 1 [IU] via SUBCUTANEOUS
  Administered 2011-07-06 (×2): 5 [IU] via SUBCUTANEOUS
  Administered 2011-07-07 (×2): 1 [IU] via SUBCUTANEOUS
  Administered 2011-07-07: 3 [IU] via SUBCUTANEOUS

## 2011-06-29 MED ORDER — CLONIDINE HCL 0.3 MG/24HR TD PTWK: 0.3000 mg | MEDICATED_PATCH | TRANSDERMAL | Status: DC

## 2011-06-29 MED ORDER — LABETALOL HCL 200 MG PO TABS: 600.0000 mg | ORAL_TABLET | Freq: Two times a day (BID) | ORAL | Status: DC

## 2011-06-29 MED ORDER — METRONIDAZOLE IN NACL 5-0.79 MG/ML-% IV SOLN
500.0000 mg | Freq: Three times a day (TID) | INTRAVENOUS | Status: DC
  Administered 2011-06-29 – 2011-07-01 (×7): 500 mg via INTRAVENOUS
  Filled 2011-06-29 (×10): qty 100

## 2011-06-29 MED ORDER — CLONIDINE HCL 0.3 MG/24HR TD PTWK
MEDICATED_PATCH | TRANSDERMAL | Status: AC
  Filled 2011-06-29: qty 1

## 2011-06-29 MED ORDER — ONDANSETRON HCL 4 MG/2ML IJ SOLN
4.0000 mg | Freq: Four times a day (QID) | INTRAMUSCULAR | Status: DC | PRN
  Administered 2011-06-29 – 2011-07-02 (×7): 4 mg via INTRAVENOUS
  Filled 2011-06-29 (×7): qty 2

## 2011-06-29 MED ORDER — ONDANSETRON HCL 4 MG PO TABS: 4.0000 mg | ORAL_TABLET | Freq: Four times a day (QID) | ORAL | Status: DC | PRN

## 2011-06-29 MED ORDER — HYDROMORPHONE HCL PF 1 MG/ML IJ SOLN
1.0000 mg | INTRAMUSCULAR | Status: DC | PRN
  Administered 2011-06-29 – 2011-07-04 (×30): 1 mg via INTRAVENOUS
  Filled 2011-06-29 (×24): qty 1

## 2011-06-29 MED ORDER — LABETALOL HCL 200 MG PO TABS
900.0000 mg | ORAL_TABLET | Freq: Two times a day (BID) | ORAL | Status: DC
  Administered 2011-06-29 (×2): 900 mg via ORAL
  Filled 2011-06-29 (×2): qty 5

## 2011-06-29 NOTE — Consult Note (Signed)
ANTIBIOTIC CONSULT NOTE - INITIAL  Pharmacy Consult for Cipro Indication: colitis  Allergies  Allergen Reactions  . Ancef (Cefazolin Sodium) Shortness Of Breath    wheezing  . Coumadin (Warfarin Sodium)     Eyes bleed  . Morphine And Related Itching  . Sulfa Antibiotics Anaphylaxis and Shortness Of Breath  . Tomato Hives and Swelling   Patient Measurements: Weight: 137 lb 12.6 oz (62.5 kg)  Vital Signs: Temp: 99.9 F (37.7 C) (05/25 0800) Temp src: Oral (05/25 0800) BP: 108/70 mmHg (05/25 0800) Pulse Rate: 133  (05/25 0800) Intake/Output from previous day: 05/24 0701 - 05/25 0700 In: -  Out: 3000  Intake/Output from this shift:    Labs:  Basename 06/29/11 0720 06/28/11 2032  WBC 6.2 6.8  HGB 11.3* 12.0  PLT 182 247  LABCREA -- --  CREATININE -- 8.36*   The CrCl is unknown because both a height and weight (above a minimum accepted value) are required for this calculation. No results found for this basename: VANCOTROUGH:2,VANCOPEAK:2,VANCORANDOM:2,GENTTROUGH:2,GENTPEAK:2,GENTRANDOM:2,TOBRATROUGH:2,TOBRAPEAK:2,TOBRARND:2,AMIKACINPEAK:2,AMIKACINTROU:2,AMIKACIN:2, in the last 72 hours   Microbiology: Recent Results (from the past 720 hour(s))  MRSA PCR SCREENING     Status: Abnormal   Collection Time   06/29/11  1:10 AM      Component Value Range Status Comment   MRSA by PCR POSITIVE (*) NEGATIVE  Final    Medical History: Past Medical History  Diagnosis Date  . Diabetes mellitus     insulin dependent diabetes mellitus with history of poor control  . Hypertension   . Hyperlipidemia   . Nephrotic syndrome   . Anemia     chronic,  Dr. Mariel Sleet,  Transfused, May, 2012  . 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 Hypertrophic cardiomyopathy     with diastolic heart dysfunction  . 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 Vaginosis   . History of MRSA infection     multiple  . Glaucoma   . Warfarin  anticoagulation     DVT  . Ejection fraction     55%, echo 12/2009  . Manic depressive disorder   . Noncompliance   . port-a-cath   . Dialysis patient     M-W-F   Medications:  Scheduled:    . sodium chloride   Intravenous Once  . amLODipine  5 mg Oral QHS  . calcium gluconate  1 g Intravenous Once  . ciprofloxacin  400 mg Intravenous Q24H  . cloNIDine  0.3 mg Transdermal Weekly  . dextrose  25 g Intravenous Once  . dextrose      . HYDROmorphone  1 mg Intravenous Once  . HYDROmorphone  1 mg Intravenous Once  . HYDROmorphone  1 mg Intravenous Once  . insulin aspart      . insulin aspart  0-9 Units Subcutaneous Q4H  . insulin aspart  10 Units Intravenous Once  . labetalol  900 mg Oral BID  . levETIRAcetam  500 mg Oral BID  . metoCLOPramide (REGLAN) injection  10 mg Intravenous Once  . metronidazole  500 mg Intravenous Once  . metronidazole  500 mg Intravenous Q8H  . ondansetron  4 mg Intravenous Once  . sodium bicarbonate  50 mEq Intravenous Once  . sodium chloride  1,000 mL Intravenous Once  . sodium chloride  3 mL Intravenous Q12H  . sodium chloride      . sodium chloride      . sodium  chloride      . sodium chloride      . DISCONTD: amLODipine  10 mg Oral QHS  . DISCONTD: ciprofloxacin  400 mg Intravenous Once  . DISCONTD: cloNIDine  0.2 mg Transdermal Weekly  . DISCONTD: labetalol  600 mg Oral BID  . DISCONTD: metoprolol tartrate  25 mg Oral BID   Assessment: 26yo F with ESRD on dialysis admitted w/ n/v The CrCl is unknown because both a height and weight (above a minimum accepted value) are required for this calculation.  Goal of Therapy:  Eradicate infection.  Plan:  Cipro 400mg  iv q24hrs Labs per protocol  Valrie Hart A 06/29/2011,9:07 AM

## 2011-06-29 NOTE — Progress Notes (Signed)
Subjective: This lady, with type 1 diabetes for the last 14 years, and on hemodialysis and end-stage renal disease, presents with nausea vomiting and abdominal pain. CT scan of her abdomen suggestive of sigmoid colitis. She has been started on metronidazole and ciprofloxacin. The vomiting is improved, she still has abdominal pain and appears to have a resting tachycardia.           Physical Exam: Blood pressure 107/76, pulse 129, temperature 98 F (36.7 C), temperature source Oral, resp. rate 15, weight 62.5 kg (137 lb 12.6 oz), SpO2 100.00%. She looks chronically sick but is not toxic or septic clinically. Heart sounds are present with a resting tachycardia, sinus rhythm at 130. Lung fields are clear abdomen is soft and tender in the left lower quadrant. She is alert and orientated without any focal neurological signs.   Investigations:  Recent Results (from the past 240 hour(s))  MRSA PCR SCREENING     Status: Abnormal   Collection Time   06/29/11  1:10 AM      Component Value Range Status Comment   MRSA by PCR POSITIVE (*) NEGATIVE  Final      Basic Metabolic Panel:  [facility] 06/28/11 2032  NA 134*  K 6.5*  CL 97  CO2 18*  GLUCOSE 136*  BUN 46*  CREATININE 8.36*  CALCIUM 10.5  MG --  PHOS 9.7*   Liver Function Tests:  [facility] 06/28/11 2032  AST 32  ALT 30  ALKPHOS 139*  BILITOT 0.2*  PROT 8.7*  ALBUMIN 3.4*     CBC:  Basename 06/28/11 2032  WBC 6.8  NEUTROABS 6.1  HGB 12.0  HCT 36.6  MCV 94.8  PLT 247    Dg Chest 1 View  06/29/2011  *RADIOLOGY REPORT*  Clinical Data: Fever, abdominal pain, and vomiting.  Dialysis patient.  CHEST - 1 VIEW  Comparison: 05/14/2011  Findings: Stable appearance of bilateral central venous catheters. Shallow inspiration.  Mild cardiac enlargement with borderline pulmonary vascularity.  No blunting of costophrenic angles.  No focal airspace consolidation.  No pneumothorax.  No significant changes since the previous study.   IMPRESSION: No evidence of active pulmonary disease.  Mild cardiac enlargement.  Original Report Authenticated By: Marlon Pel, M.D.   Ct Abdomen Pelvis W Contrast  06/28/2011  *RADIOLOGY REPORT*  Clinical Data: Abdominal pain, vomiting, on dialysis  CT ABDOMEN AND PELVIS WITH CONTRAST  Technique:  Multidetector CT imaging of the abdomen and pelvis was performed following the standard protocol during bolus administration of intravenous contrast.  Contrast: 50mL OMNIPAQUE IOHEXOL 300 MG/ML  SOLN  Comparison: [facility] CT abdomen pelvis dated 06/18/2011  Findings: Very mild patchy opacities at the lung bases, likely atelectasis / scarring.  Liver, spleen, pancreas, and adrenal glands are within normal limits.  Gallbladder is unremarkable.  No intrahepatic or extrahepatic ductal dilatation.  Kidneys are unremarkable.  No hydronephrosis.  No evidence of bowel obstruction.  Normal appendix.  Thickening of the sigmoid colon, suspicious for colitis.  The appearance was more pronounced on the recent prior study.  No evidence of abdominal aortic aneurysm.  No abdominopelvic ascites.  No suspicious abdominopelvic lymphadenopathy.  Uterus is unremarkable.  4.1 x 3.1 cm cystic lesion in the right ovary, likely physiologic given age.  Bladder is mildly thick-walled, correlate for cystitis.  Visualized osseous structures are within normal limits.  IMPRESSION: Normal appendix.  No evidence of bowel obstruction.  Thickening of the sigmoid colon, suspicious for colitis.  Thick-walled bladder, correlate for cystitis.  4.1 x 3.1 cm right ovarian cyst, likely physiologic given age. Follow-up pelvic ultrasound is suggested in 6-10 weeks.  Original Report Authenticated By: Charline Bills, M.D.      Medications: I have reviewed the patient's current medications.  Impression: 1. Hyperkalemia secondary to end-stage renal disease, status post hemodialysis yesterday. Appreciate nephrology consultation. Await blood work  today. 2. Probable sigmoid colitis. Await C. difficile toxin results by PCR. 3. End stage renal disease on hemodialysis. 4. 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 5. Resting tachycardia. There is no clear source, I wonder whether this is rebound tachycardia relating to her medications for hypertension.     Plan: 1. Continue with current therapy. 2. Reduce dose of amlodipine and increase labetalol.     LOS: 1 day   Wilson Singer Pager [phone]  06/29/2011, 7:35 AM

## 2011-06-30 ENCOUNTER — Inpatient Hospital Stay (HOSPITAL_COMMUNITY): Payer: Medicaid Other

## 2011-06-30 DIAGNOSIS — R112 Nausea with vomiting, unspecified: Secondary | ICD-10-CM

## 2011-06-30 DIAGNOSIS — R109 Unspecified abdominal pain: Secondary | ICD-10-CM

## 2011-06-30 DIAGNOSIS — N186 End stage renal disease: Secondary | ICD-10-CM

## 2011-06-30 DIAGNOSIS — K529 Noninfective gastroenteritis and colitis, unspecified: Secondary | ICD-10-CM

## 2011-06-30 LAB — PHOSPHORUS: Phosphorus: 7.1 mg/dL — ABNORMAL HIGH (ref 2.3–4.6)

## 2011-06-30 LAB — COMPREHENSIVE METABOLIC PANEL
Albumin: 2.7 g/dL — ABNORMAL LOW (ref 3.5–5.2)
BUN: 29 mg/dL — ABNORMAL HIGH (ref 6–23)
Calcium: 9.1 mg/dL (ref 8.4–10.5)
Creatinine, Ser: 5.95 mg/dL — ABNORMAL HIGH (ref 0.50–1.10)
GFR calc Af Amer: 10 mL/min — ABNORMAL LOW (ref >90)
Total Protein: 7.1 g/dL (ref 6.0–8.3)

## 2011-06-30 LAB — CBC
HCT: 30.4 % — ABNORMAL LOW (ref 36.0–46.0)
MCH: 31.4 pg (ref 26.0–34.0)
MCHC: 32.9 g/dL (ref 30.0–36.0)
MCV: 95.6 fL (ref 78.0–100.0)
RDW: 14 % (ref 11.5–15.5)

## 2011-06-30 LAB — GLUCOSE, CAPILLARY
Glucose-Capillary: 119 mg/dL — ABNORMAL HIGH (ref 70–99)
Glucose-Capillary: 121 mg/dL — ABNORMAL HIGH (ref 70–99)
Glucose-Capillary: 122 mg/dL — ABNORMAL HIGH (ref 70–99)

## 2011-06-30 LAB — CLOSTRIDIUM DIFFICILE BY PCR: Toxigenic C. Difficile by PCR: NEGATIVE

## 2011-06-30 LAB — HEPATITIS B SURFACE ANTIGEN: Hepatitis B Surface Ag: NEGATIVE

## 2011-06-30 MED ORDER — IOHEXOL 300 MG/ML  SOLN
100.0000 mL | Freq: Once | INTRAMUSCULAR | Status: AC | PRN
  Administered 2011-06-30: 100 mL via INTRAVENOUS

## 2011-06-30 MED ORDER — IOHEXOL 300 MG/ML  SOLN
40.0000 mL | Freq: Once | INTRAMUSCULAR | Status: AC | PRN
  Administered 2011-06-30: 40 mL via ORAL

## 2011-06-30 MED ORDER — HEPARIN SODIUM (PORCINE) 1000 UNIT/ML DIALYSIS
100.0000 [IU]/kg | INTRAMUSCULAR | Status: DC | PRN
  Administered 2011-07-01: 6600 [IU] via INTRAVENOUS_CENTRAL
  Filled 2011-06-30: qty 7

## 2011-06-30 MED ORDER — VANCOMYCIN HCL IN DEXTROSE 1-5 GM/200ML-% IV SOLN
1000.0000 mg | Freq: Once | INTRAVENOUS | Status: AC
  Administered 2011-06-30: 1000 mg via INTRAVENOUS
  Filled 2011-06-30: qty 200

## 2011-06-30 MED ORDER — LABETALOL HCL 200 MG PO TABS
600.0000 mg | ORAL_TABLET | Freq: Three times a day (TID) | ORAL | Status: DC
  Administered 2011-06-30 – 2011-07-01 (×4): 600 mg via ORAL
  Filled 2011-06-30 (×5): qty 3

## 2011-06-30 MED ORDER — BISACODYL 10 MG RE SUPP: 10.0000 mg | Freq: Two times a day (BID) | RECTAL | Status: DC | PRN

## 2011-06-30 MED ORDER — HEPARIN SODIUM (PORCINE) 1000 UNIT/ML DIALYSIS
1000.0000 [IU] | INTRAMUSCULAR | Status: DC | PRN
  Administered 2011-07-03: 3200 [IU] via INTRAVENOUS_CENTRAL
  Filled 2011-06-30: qty 1

## 2011-06-30 MED ORDER — ACETAMINOPHEN 325 MG PO TABS
650.0000 mg | ORAL_TABLET | Freq: Once | ORAL | Status: AC
  Administered 2011-06-30: 650 mg via ORAL
  Filled 2011-06-30: qty 2

## 2011-06-30 NOTE — Progress Notes (Signed)
Followup note Reason for followup end-stage renal disease. The patient with   history of  diabetes, end-stage on hemodialysis presently came with abdominal pain, nausea and  vomiting. Patient states  that she has nausea but no vomiting. She denies any diarrhea . She denies also any difficulty breathing. On examination her blood pressure is 122/7 7 temperature 100.9 heart rate of her 110. Her chest decreased breath sound bilaterally Heart exam revealed regular rate and rhythm she is to 6 systolic ejection murmur Abdomen soft positive bowel sound she has some tenderness no rebound tenderness. Extremities no edema. Problem #1 end-stage renal disease she status post hemodialysis the day before yesterday her BUN and creatinine is a 29 and 9.92. Problem #2 hyperkalemia potassium is 4.8 corrected Problem #3 history of fever etiology as this moment is not clear patient complains of some abdominal pain. Presently she is on antibiotics. 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 And she has a catheter. Presently her white blood cell count is normal. Problem #4 history of anemia her hemoglobin is 10.0 and hematocrit 30.4 stable Problem #5 history of hypertension her blood pressure seems to be controlled very well Problem #6 history of diabetes her Plan: We'll make arrangements for patient to get dialysis tomorrow I will give her vancomycin 1 g today her. I agree with the blood culture. If her patient continues to be febrile probably echocardiogram may be reasonable to rule out endocarditis.

## 2011-06-30 NOTE — Plan of Care (Signed)
Problem: Consults Goal: General Medical Patient Education See Patient Education Module for specific education. Outcome: Progressing Patient admitted with hyperkalemia and need for Dialysis M-W-F Goal: Nutrition Consult-if indicated Outcome: Progressing Patient with poor appetite as she is dealing with gastritis on Abdominal CT Goal: Diabetes Guidelines if Diabetic/Glucose > 140 If diabetic or lab glucose is > 140 mg/dl - Initiate Diabetes/Hyperglycemia Guidelines & Document Interventions  Outcome: Progressing SS insulin coverage  Problem: Phase I Progression Outcomes Goal: Pain controlled with appropriate interventions Outcome: Not Progressing Pain issues continue with gastritis pain Goal: Voiding-avoid urinary catheter unless indicated Outcome: Not Progressing anuric

## 2011-06-30 NOTE — Progress Notes (Signed)
Subjective: This lady, with type 1 diabetes for the last 14 years, and on hemodialysis and end-stage renal disease, presents with nausea vomiting and abdominal pain. CT scan of her abdomen suggestive of sigmoid colitis. She has been started on metronidazole and ciprofloxacin. The vomiting is improved, she still has abdominal pain and appears to have a resting tachycardia, although this is somewhat better. She has not opened her bowels. A renal diet was attempted yesterday but she had abdominal pain with this. She is however tolerating a full liquid diet without any nausea or vomiting. She did have fever yesterday.           Physical Exam: Blood pressure 122/77, pulse 116, temperature 100.9 F (38.3 C), temperature source Oral, resp. rate 18, weight 65.7 kg (144 lb 13.5 oz), SpO2 100.00%. She looks chronically sick but is not toxic or septic clinically. Heart sounds are present with a resting tachycardia, sinus rhythm at 110. Lung fields are clear abdomen is soft and tender in the left lower quadrant. She is alert and orientated without any focal neurological signs.   Investigations:  Recent Results (from the past 240 hour(s))  MRSA PCR SCREENING     Status: Abnormal   Collection Time   06/29/11  1:10 AM      Component Value Range Status Comment   MRSA by PCR POSITIVE (*) NEGATIVE  Final   CULTURE, BLOOD (SINGLE)     Status: Normal (Preliminary result)   Collection Time   06/30/11 12:30 AM      Component Value Range Status Comment   Specimen Description Blood PORTA CATH   Final    Special Requests     Final    Value: BOTTLES DRAWN AEROBIC AND ANAEROBIC 7CC DRAWN BY RN   Culture PENDING   Incomplete    Report Status PENDING   Incomplete      Basic Metabolic Panel:  Basename 06/30/11 0501 06/29/11 0720 06/28/11 2032  NA 130* 136 --  K 4.8 4.2 --  CL 91* 95* --  CO2 25 26 --  GLUCOSE 121* 97 --  BUN 29* 12 --  CREATININE 5.95* 3.57* --  CALCIUM 9.1 9.5 --  MG -- -- --  PHOS  7.1* -- 9.7*   Liver Function Tests:  [facility] 06/30/11 0501 06/28/11 2032  AST 69* 32  ALT 33 30  ALKPHOS 131* 139*  BILITOT 0.2* 0.2*  PROT 7.1 8.7*  ALBUMIN 2.7* 3.4*     CBC:  Basename 06/30/11 0501 06/29/11 0720 06/28/11 2032  WBC 6.5 6.2 --  NEUTROABS -- -- 6.1  HGB 10.0* 11.3* --  HCT 30.4* 34.1* --  MCV 95.6 94.5 --  PLT 203 182 --    Dg Chest 1 View  06/29/2011  *RADIOLOGY REPORT*  Clinical Data: Fever, abdominal pain, and vomiting.  Dialysis patient.  CHEST - 1 VIEW  Comparison: 05/14/2011  Findings: Stable appearance of bilateral central venous catheters. Shallow inspiration.  Mild cardiac enlargement with borderline pulmonary vascularity.  No blunting of costophrenic angles.  No focal airspace consolidation.  No pneumothorax.  No significant changes since the previous study.  IMPRESSION: No evidence of active pulmonary disease.  Mild cardiac enlargement.  Original Report Authenticated By: Marlon Pel, M.D.   Ct Abdomen Pelvis W Contrast  06/28/2011  *RADIOLOGY REPORT*  Clinical Data: Abdominal pain, vomiting, on dialysis  CT ABDOMEN AND PELVIS WITH CONTRAST  Technique:  Multidetector CT imaging of the abdomen and pelvis was performed following the standard protocol during  bolus administration of intravenous contrast.  Contrast: 50mL OMNIPAQUE IOHEXOL 300 MG/ML  SOLN  Comparison: [facility] CT abdomen pelvis dated 06/18/2011  Findings: Very mild patchy opacities at the lung bases, likely atelectasis / scarring.  Liver, spleen, pancreas, and adrenal glands are within normal limits.  Gallbladder is unremarkable.  No intrahepatic or extrahepatic ductal dilatation.  Kidneys are unremarkable.  No hydronephrosis.  No evidence of bowel obstruction.  Normal appendix.  Thickening of the sigmoid colon, suspicious for colitis.  The appearance was more pronounced on the recent prior study.  No evidence of abdominal aortic aneurysm.  No abdominopelvic ascites.  No suspicious  abdominopelvic lymphadenopathy.  Uterus is unremarkable.  4.1 x 3.1 cm cystic lesion in the right ovary, likely physiologic given age.  Bladder is mildly thick-walled, correlate for cystitis.  Visualized osseous structures are within normal limits.  IMPRESSION: Normal appendix.  No evidence of bowel obstruction.  Thickening of the sigmoid colon, suspicious for colitis.  Thick-walled bladder, correlate for cystitis.  4.1 x 3.1 cm right ovarian cyst, likely physiologic given age. Follow-up pelvic ultrasound is suggested in 6-10 weeks.  Original Report Authenticated By: Charline Bills, M.D.      Medications: I have reviewed the patient's current medications.  Impression: 1. Hyperkalemia secondary to end-stage renal disease, resolved. 2. Probable sigmoid colitis. Await C. difficile toxin results by PCR. Clinically, I am not clear of this diagnosis 3. End stage renal disease on hemodialysis. 4. 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 5. Resting tachycardia. There is no clear source, I wonder whether this is rebound tachycardia relating to her medications for hypertension.     Plan: 1. Repeat CT scan. 2. Adjust  labetalol to 3 times a day. 3. Patient can transfer to telemetry floor.     LOS: 2 days   Wilson Singer Pager [phone]  06/30/2011, 7:27 AM

## 2011-07-01 DIAGNOSIS — K529 Noninfective gastroenteritis and colitis, unspecified: Secondary | ICD-10-CM

## 2011-07-01 DIAGNOSIS — N186 End stage renal disease: Secondary | ICD-10-CM

## 2011-07-01 DIAGNOSIS — R109 Unspecified abdominal pain: Secondary | ICD-10-CM

## 2011-07-01 DIAGNOSIS — R112 Nausea with vomiting, unspecified: Secondary | ICD-10-CM

## 2011-07-01 LAB — BASIC METABOLIC PANEL
BUN: 45 mg/dL — ABNORMAL HIGH (ref 6–23)
Chloride: 90 mEq/L — ABNORMAL LOW (ref 96–112)
GFR calc Af Amer: 7 mL/min — ABNORMAL LOW (ref >90)
Potassium: 5.1 mEq/L (ref 3.5–5.1)

## 2011-07-01 LAB — CBC
HCT: 28.7 % — ABNORMAL LOW (ref 36.0–46.0)
Hemoglobin: 9.5 g/dL — ABNORMAL LOW (ref 12.0–15.0)
WBC: 7.3 10*3/uL (ref 4.0–10.5)

## 2011-07-01 LAB — GLUCOSE, CAPILLARY
Glucose-Capillary: 81 mg/dL (ref 70–99)
Glucose-Capillary: 72 mg/dL (ref 70–99)
Glucose-Capillary: 80 mg/dL (ref 70–99)
Glucose-Capillary: 93 mg/dL (ref 70–99)
Glucose-Capillary: 58 mg/dL — ABNORMAL LOW (ref 70–99)

## 2011-07-01 MED ORDER — VANCOMYCIN HCL 1000 MG IV SOLR
750.0000 mg | INTRAVENOUS | Status: DC
  Administered 2011-07-01 – 2011-07-10 (×5): 750 mg via INTRAVENOUS
  Filled 2011-07-01 (×7): qty 750

## 2011-07-01 MED ORDER — CHLORHEXIDINE GLUCONATE CLOTH 2 % EX PADS
6.0000 | MEDICATED_PAD | Freq: Every day | CUTANEOUS | Status: AC
  Administered 2011-07-02 – 2011-07-06 (×3): 6 via TOPICAL

## 2011-07-01 MED ORDER — EPOETIN ALFA 10000 UNIT/ML IJ SOLN
12000.0000 [IU] | INTRAMUSCULAR | Status: DC
  Administered 2011-07-01 – 2011-07-10 (×5): 12000 [IU] via INTRAVENOUS
  Filled 2011-07-01 (×10): qty 2

## 2011-07-01 MED ORDER — ONDANSETRON HCL 4 MG/2ML IJ SOLN
4.0000 mg | Freq: Three times a day (TID) | INTRAMUSCULAR | Status: AC | PRN
  Filled 2011-07-01: qty 2

## 2011-07-01 MED ORDER — MUPIROCIN 2 % EX OINT
1.0000 "application " | TOPICAL_OINTMENT | Freq: Two times a day (BID) | CUTANEOUS | Status: AC
  Administered 2011-07-02 – 2011-07-06 (×10): 1 via NASAL
  Filled 2011-07-01 (×4): qty 22

## 2011-07-01 MED ORDER — LOPERAMIDE HCL 2 MG PO CAPS
4.0000 mg | ORAL_CAPSULE | Freq: Once | ORAL | Status: AC
  Administered 2011-07-01: 4 mg via ORAL
  Filled 2011-07-01: qty 2

## 2011-07-01 MED ORDER — HYDROMORPHONE HCL PF 1 MG/ML IJ SOLN
1.0000 mg | INTRAMUSCULAR | Status: AC | PRN
  Filled 2011-07-01: qty 1

## 2011-07-01 NOTE — Procedures (Signed)
Attempted to call [facility] for plan of care. Clinic currently closed.

## 2011-07-01 NOTE — Consult Note (Signed)
ANTIBIOTIC CONSULT NOTE - Follow-up  Pharmacy Consult for Vancomycin Indication: bacteremia  Allergies  Allergen Reactions  . Ancef (Cefazolin Sodium) Shortness Of Breath    wheezing  . Coumadin (Warfarin Sodium)     Eyes bleed  . Morphine And Related Itching  . Sulfa Antibiotics Anaphylaxis and Shortness Of Breath  . Tomato Hives and Swelling   Patient Measurements: Height: 5\' 8"  (172.7 cm) Weight: 144 lb 13.5 oz (65.7 kg) IBW/kg (Calculated) : 63.9   Vital Signs: Temp: 98.9 F (37.2 C) (05/27 0419) BP: 110/73 mmHg (05/27 0419) Pulse Rate: 102  (05/27 0419) Intake/Output from previous day: 05/26 0701 - 05/27 0700 In: 1180 [P.O.:580; IV Piggyback:600] Out: 52 [Urine:50; Stool:2] Intake/Output from this shift:    Labs:  Basename 07/01/11 0545 06/30/11 0501 06/29/11 0720  WBC 7.3 6.5 6.2  HGB 9.5* 10.0* 11.3*  PLT 189 203 182  LABCREA -- -- --  CREATININE 8.08* 5.95* 3.57*   Estimated Creatinine Clearance: 10.7 ml/min (by C-G formula based on Cr of 8.08). No results found for this basename: VANCOTROUGH:2,VANCOPEAK:2,VANCORANDOM:2,GENTTROUGH:2,GENTPEAK:2,GENTRANDOM:2,TOBRATROUGH:2,TOBRAPEAK:2,TOBRARND:2,AMIKACINPEAK:2,AMIKACINTROU:2,AMIKACIN:2, in the last 72 hours   Microbiology: Recent Results (from the past 720 hour(s))  MRSA PCR SCREENING     Status: Abnormal   Collection Time   06/29/11  1:10 AM      Component Value Range Status Comment   MRSA by PCR POSITIVE (*) NEGATIVE  Final   CULTURE, BLOOD (SINGLE)     Status: Normal (Preliminary result)   Collection Time   06/30/11 12:30 AM      Component Value Range Status Comment   Specimen Description Blood PORTA CATH   Final    Special Requests     Final    Value: BOTTLES DRAWN AEROBIC AND ANAEROBIC 7CC DRAWN BY RN   Culture  Setup Time [id number]   Final    Culture     Final    Value: GRAM POSITIVE COCCI IN CLUSTERS     Note: Gram Stain Report Called to,Read Back By and Verified With: JARRELL W. AT 12PM ON  [id number] BY THOMPSON S. Performed at [facility]   Report Status PENDING   Incomplete   STOOL CULTURE     Status: Normal (Preliminary result)   Collection Time   06/30/11 10:45 AM      Component Value Range Status Comment   Specimen Description STOOL   Final    Special Requests NONE   Final    Culture Culture reincubated for better growth   Final    Report Status PENDING   Incomplete   CLOSTRIDIUM DIFFICILE BY PCR     Status: Normal   Collection Time   06/30/11 10:45 AM      Component Value Range Status Comment   C difficile by pcr NEGATIVE  NEGATIVE  Final    Medical History: Past Medical History  Diagnosis Date  . Diabetes mellitus     insulin dependent diabetes mellitus with history of poor control  . Hypertension   . Hyperlipidemia   . Nephrotic syndrome   . Anemia     chronic,  Dr. Mariel Sleet,  Transfused, May, 2012  . 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 Hypertrophic cardiomyopathy     with diastolic heart dysfunction  . 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 Vaginosis   . History of MRSA infection     multiple  . Glaucoma   . Warfarin  anticoagulation     DVT  . Ejection fraction     55%, echo 12/2009  . Manic depressive disorder   . Noncompliance   . port-a-cath   . Dialysis patient     M-W-F   Medications:  Scheduled:     . amLODipine  5 mg Oral QHS  . ciprofloxacin  400 mg Intravenous Q24H  . cloNIDine  0.3 mg Transdermal Weekly  . dextrose  25 g Intravenous Once  . HYDROmorphone  1 mg Intravenous Once  . insulin aspart  0-9 Units Subcutaneous Q4H  . insulin aspart  10 Units Intravenous Once  . labetalol  600 mg Oral TID  . levETIRAcetam  500 mg Oral BID  . metronidazole  500 mg Intravenous Q8H  . sodium chloride  3 mL Intravenous Q12H  . vancomycin  750 mg Intravenous Q M,W,F-HD  . vancomycin  1,000 mg Intravenous Once  . DISCONTD: metronidazole  500 mg Intravenous Once    Assessment: 26yo F admitted with n/v currently on Cipro/Flagyl for colitis.  Cdiff PCR was negative. Today her blood cx from HD catheter is growing GPC.   Vancomycin 1gm load dose was given yesterday. Plans for routine HD today.  She is currently afebrile with normal WBC.  Goal of Therapy:  Eradicate infection. Pre-HD vancomycin level 15-25 mcg/ml  Plan:  1) Vancomycin 750mg  IV after each dialysis. 2) Continue Cipro 400mg  iv q24hrs & Flagyl 500mg  IV Q8h 3) Follow-up final cx data & treatment plan  [name] 07/01/2011,10:07 AM

## 2011-07-01 NOTE — Progress Notes (Signed)
Subjective: This lady is not feeling better. She had further nausea and vomiting yesterday. She continues to spike fevers. Blood cultures are now showing gram-positive cocci in clusters. She continues on ciprofloxacin and metronidazole. C. difficile toxin by PCR was negative.          Physical Exam: Blood pressure 110/73, pulse 102, temperature 98.9 F (37.2 C), temperature source Oral, resp. rate 16, height 5\' 8"  (1.727 m), weight 65.7 kg (144 lb 13.5 oz), SpO2 96.00%. She looks chronically sick but is not toxic or septic clinically. Heart sounds are present with a resting tachycardia, sinus rhythm at 100. Lung fields are clear abdomen is soft and tender in the left lower quadrant. She is alert and orientated without any focal neurological signs.   Investigations:  Recent Results (from the past 240 hour(s))  MRSA PCR SCREENING     Status: Abnormal   Collection Time   06/29/11  1:10 AM      Component Value Range Status Comment   MRSA by PCR POSITIVE (*) NEGATIVE  Final   CULTURE, BLOOD (SINGLE)     Status: Normal (Preliminary result)   Collection Time   06/30/11 12:30 AM      Component Value Range Status Comment   Specimen Description Blood PORTA CATH   Final    Special Requests     Final    Value: BOTTLES DRAWN AEROBIC AND ANAEROBIC 7CC DRAWN BY RN   Culture  Setup Time [id number]   Final    Culture     Final    Value: GRAM POSITIVE COCCI IN CLUSTERS     Note: Gram Stain Report Called to,Read Back By and Verified With: JARRELL W. AT 12PM ON [id number] BY THOMPSON S. Performed at [facility]   Report Status PENDING   Incomplete   CLOSTRIDIUM DIFFICILE BY PCR     Status: Normal   Collection Time   06/30/11 10:45 AM      Component Value Range Status Comment   C difficile by pcr NEGATIVE  NEGATIVE  Final      Basic Metabolic Panel:  Basename 07/01/11 0545 06/30/11 0501 06/28/11 2032  NA 129* 130* --  K 5.1 4.8 --  CL 90* 91* --  CO2 23 25 --  GLUCOSE 99 121* --    BUN PENDING 29* --  CREATININE 8.08* 5.95* --  CALCIUM 9.6 9.1 --  MG -- -- --  PHOS -- 7.1* 9.7*   Liver Function Tests:  Basename 06/30/11 0501 06/28/11 2032  AST 69* 32  ALT 33 30  ALKPHOS 131* 139*  BILITOT 0.2* 0.2*  PROT 7.1 8.7*  ALBUMIN 2.7* 3.4*     CBC:  Basename 07/01/11 0545 06/30/11 0501 06/28/11 2032  WBC 7.3 6.5 --  NEUTROABS -- -- 6.1  HGB 9.5* 10.0* --  HCT 28.7* 30.4* --  MCV 94.4 95.6 --  PLT 189 203 --    Ct Abdomen Pelvis W Contrast  06/30/2011  *RADIOLOGY REPORT*  Clinical Data: Abdominal pain and fever.  End-stage renal disease.  CT ABDOMEN AND PELVIS WITH CONTRAST  Technique:  Multidetector CT imaging of the abdomen and pelvis was performed following the standard protocol during bolus administration of intravenous contrast.  Contrast: OMNIPAQUE IOHEXOL 300 MG/ML  SOLN  Comparison: 06/28/2011  Findings: There is prominent hepatomegaly.  No focal lesions in the liver.  No dilated bile ducts.  Gallbladder is normal.  The spleen, pancreas, adrenal glands, and kidneys are normal. Terminal ileum and appendix  are normal.  Small bowel is normal.  Again noted is a 3.7 cm cyst on the right ovary, unchanged.  Uterus is normal.  Left ovary is not discretely identified.  There is slight prominence of the mucosa of the distal colon but the colon is collapsed.  There is no pericolonic inflammation.  No acute osseous abnormalities.  IMPRESSION:  1.  Hepatomegaly. 2.  Stable cyst on the right ovary. 3.  Slight prominence of the mucosa of the distal colon. Distal colon is decompressed in this may be purely be due to the lack of distention but colitis should be considered.  Original Report Authenticated By: Gwynn Burly, M.D.      Medications: I have reviewed the patient's current medications.  Impression: 1. Hyperkalemia secondary to end-stage renal disease, resolved. 2. Probable sigmoid colitis based on second CT scan abdomen yesterday. 3. End stage renal  disease on hemodialysis. 4. 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 5. Gram-positive cocci in clusters in the blood..     Plan: 1. And intravenous vancomycin. 2. Continue current therapy.     LOS: 3 days   Wilson Singer Pager [phone]  07/01/2011, 7:40 AM

## 2011-07-01 NOTE — Progress Notes (Signed)
Received call from Tollie Eth at lab who reported px blood cultures was growing MRSA. Marton Redwood, RN, MSN

## 2011-07-01 NOTE — Progress Notes (Signed)
Progress note Reason for followup end-stage renal disease Patient was history of diabetes, hypertension presently had came with abdominal pain, nausea and vomiting. Presently she complains of some nausea but no vomiting. Her she denies any difficulty breathing.  On examination the patient is alert and in no apparent distress Her blood pressure is under kin or sensory temperature 98.9 MAXIMUM TEMPERATURE was 102.4 and heart rate of 102 Her chest is clear to auscultation Heart exam regular rate and rhythm no murmur Abdomen soft nontender and as this moment she doesn't have also rebound tenderness positive bowel sound Extremities no edema.  Assessment end-stage renal disease her BUN is 45 creatinine of 8.65 patient is due for dialysis today. Problem #2 hyperkalemia potassium is 5.1 has corrected Problem #3 history of nausea vomiting possibly diabetic gastropathy however other etiologies has this moment cannot ruled out. Patient also has previous history of pancreatitis. Problem #4 anemia her hemoglobin is 9.5 hematocrit 28.7 she is on Epogen H&H is stable Problem #5 history of fever etiology not clear however her blood culture grows gram-positive cocci hence could be a line infection. Patient has received 1 dose of vancomycin yesterday empirically. Her white cell count is normal. Problem #6 history of hypertension her blood pressure seems to be controlled very well Problem #7 history of diabetes  Plan: We'll make arrangements for patient to get dialysis today We'll continue with vancomycin and follow her blood culture. If her blood culture grows MRSA possibly patient may need to have catheter replaced over a guidewire. We'll follow her basic metabolic panel, CBC and also phosphorus in the morning.

## 2011-07-02 DIAGNOSIS — R112 Nausea with vomiting, unspecified: Secondary | ICD-10-CM

## 2011-07-02 DIAGNOSIS — A4102 Sepsis due to Methicillin resistant Staphylococcus aureus: Secondary | ICD-10-CM

## 2011-07-02 DIAGNOSIS — N186 End stage renal disease: Secondary | ICD-10-CM

## 2011-07-02 DIAGNOSIS — R109 Unspecified abdominal pain: Secondary | ICD-10-CM

## 2011-07-02 LAB — CBC
MCHC: 32.9 g/dL (ref 30.0–36.0)
Platelets: 223 10*3/uL (ref 150–400)
RDW: 14 % (ref 11.5–15.5)
WBC: 6.4 10*3/uL (ref 4.0–10.5)

## 2011-07-02 LAB — CULTURE, BLOOD (SINGLE): Culture  Setup Time: 201305261944

## 2011-07-02 LAB — BASIC METABOLIC PANEL
Calcium: 9.1 mg/dL (ref 8.4–10.5)
Chloride: 95 mEq/L — ABNORMAL LOW (ref 96–112)
Creatinine, Ser: 5.22 mg/dL — ABNORMAL HIGH (ref 0.50–1.10)
GFR calc Af Amer: 12 mL/min — ABNORMAL LOW (ref >90)
GFR calc non Af Amer: 11 mL/min — ABNORMAL LOW (ref >90)

## 2011-07-02 LAB — GLUCOSE, CAPILLARY
Glucose-Capillary: 110 mg/dL — ABNORMAL HIGH (ref 70–99)
Glucose-Capillary: 110 mg/dL — ABNORMAL HIGH (ref 70–99)
Glucose-Capillary: 119 mg/dL — ABNORMAL HIGH (ref 70–99)
Glucose-Capillary: 183 mg/dL — ABNORMAL HIGH (ref 70–99)
Glucose-Capillary: 162 mg/dL — ABNORMAL HIGH (ref 70–99)

## 2011-07-02 LAB — MRSA PCR SCREENING: MRSA by PCR: POSITIVE — AB

## 2011-07-02 MED ORDER — SODIUM CHLORIDE 0.9 % IJ SOLN
INTRAMUSCULAR | Status: AC
  Filled 2011-07-02: qty 3

## 2011-07-02 MED ORDER — LABETALOL HCL 200 MG PO TABS
300.0000 mg | ORAL_TABLET | Freq: Three times a day (TID) | ORAL | Status: DC
  Administered 2011-07-02 – 2011-07-05 (×5): 300 mg via ORAL
  Filled 2011-07-02 (×7): qty 2

## 2011-07-02 NOTE — Progress Notes (Signed)
Report Recevied. Patient resting in bed and denies any needs at this time. Will continue to monitor.

## 2011-07-02 NOTE — Consult Note (Signed)
Asked to remove her dialysis catheter and her power port due to bacteremia. Patient is refusing to have these removed. Drs. Karilyn Cota and [place] are aware.

## 2011-07-02 NOTE — Progress Notes (Signed)
Subjective: Interval History: has no complaint of nausea or vomiting. She denies any difficulty breathing. She said he still she doesn't feel good but no specific complaints. She is some weakness..  Objective: Vital signs in last 24 hours: Temp:  [98.4 F (36.9 C)-99.9 F (37.7 C)] 99.3 F (37.4 C) (05/28 0616) Pulse Rate:  [95-117] 110  (05/28 0616) Resp:  [16-20] 16  (05/28 0616) BP: (57-130)/(32-84) 129/84 mmHg (05/28 0616) SpO2:  [95 %-100 %] 99 % (05/28 0616) FiO2 (%):  [21 %] 21 % (05/27 2333) Weight:  [62.1 kg (136 lb 14.5 oz)-62.8 kg (138 lb 7.2 oz)] 62.1 kg (136 lb 14.5 oz) (05/28 0454) Weight change:   Intake/Output from previous day: 05/27 0701 - 05/28 0700 In: 330 [P.O.:90; I.V.:240] Out: 2900  Intake/Output this shift:    General appearance: alert, cooperative and no distress Resp: clear to auscultation bilaterally Cardio: regular rate and rhythm, S1, S2 normal, no murmur, click, rub or gallop GI: soft, non-tender; bowel sounds normal; no masses,  no organomegaly Extremities: extremities normal, atraumatic, no cyanosis or edema  Lab Results:  Basename 07/01/11 0545 06/30/11 0501  WBC 7.3 6.5  HGB 9.5* 10.0*  HCT 28.7* 30.4*  PLT 189 203   BMET:  Basename 07/01/11 0545 06/30/11 0501  NA 129* 130*  K 5.1 4.8  CL 90* 91*  CO2 23 25  GLUCOSE 99 121*  BUN 45* 29*  CREATININE 8.08* 5.95*  CALCIUM 9.6 9.1   No results found for this basename: PTH:2 in the last 72 hours Iron Studies: No results found for this basename: IRON,TIBC,TRANSFERRIN,FERRITIN in the last 72 hours  Studies/Results: Ct Abdomen Pelvis W Contrast  06/30/2011  *RADIOLOGY REPORT*  Clinical Data: Abdominal pain and fever.  End-stage renal disease.  CT ABDOMEN AND PELVIS WITH CONTRAST  Technique:  Multidetector CT imaging of the abdomen and pelvis was performed following the standard protocol during bolus administration of intravenous contrast.  Contrast: OMNIPAQUE IOHEXOL 300 MG/ML   SOLN  Comparison: 06/28/2011  Findings: There is prominent hepatomegaly.  No focal lesions in the liver.  No dilated bile ducts.  Gallbladder is normal.  The spleen, pancreas, adrenal glands, and kidneys are normal. Terminal ileum and appendix are normal.  Small bowel is normal.  Again noted is a 3.7 cm cyst on the right ovary, unchanged.  Uterus is normal.  Left ovary is not discretely identified.  There is slight prominence of the mucosa of the distal colon but the colon is collapsed.  There is no pericolonic inflammation.  No acute osseous abnormalities.  IMPRESSION:  1.  Hepatomegaly. 2.  Stable cyst on the right ovary. 3.  Slight prominence of the mucosa of the distal colon. Distal colon is decompressed in this may be purely be due to the lack of distention but colitis should be considered.  Original Report Authenticated By: Gwynn Burly, M.D.    I have reviewed the patient's current medications.  Assessment/Plan: Problem #1 end-stage renal disease she status post hemodialysis yesterday her BUN and creatinine predialysis was 45 and 8.08 is stable. Her potassium was 5.1. Problem #2 fever blood culture grew MRSA. The blood was drawn from a MediPort. Patient presently on vancomycin she is a febrile.  problem #3 hypertension her blood pressure seems to be somewhat low normal. Problem #4 anemia this is secondary to end-stage renal disease she is on Epogen Problem #5 nausea vomiting at this moment seems to be better. Problem #6 history of diabetes Problem #7 history  of diabetic gastropathy Plan: At this moment we'll ask surgery to remove does MediPort in dialysis catheter. We'll try to make arrangement to catheter placed for hemodialysis possibly after 24-48 hours. In the meantime if required dialysis we'll ask her femoral catheter placement. We'll check her CBC and basic metabolic panel in the morning. We'll decrease labetalol to 300 mg by mouth twice a day. Plan:     LOS: 4 days    Caidynce Muzyka S 07/02/2011,7:13 AM

## 2011-07-02 NOTE — Progress Notes (Signed)
Subjective: This lady is actually feeling better I believe now. She has grown MRSA in her blood. She is on intravenous vancomycin. Ciprofloxacin and metronidazole were discontinued. Dr Fausto Skillern is surgery to remove the MediPort. I think this is a good idea. The patient is trying to eat something this morning which I think is an improvement.          Physical Exam: Blood pressure 129/84, pulse 110, temperature 99.3 F (37.4 C), temperature source Oral, resp. rate 16, height 5\' 8"  (1.727 m), weight 62.1 kg (136 lb 14.5 oz), SpO2 97.00%. She looks chronically sick but is not toxic or septic clinically. Heart sounds are present with a resting tachycardia, sinus rhythm at 100. Lung fields are clear abdomen is soft and tender in the left lower quadrant. She is alert and orientated without any focal neurological signs.   Investigations:  Recent Results (from the past 240 hour(s))  MRSA PCR SCREENING     Status: Abnormal   Collection Time   06/29/11  1:10 AM      Component Value Range Status Comment   MRSA by PCR POSITIVE (*) NEGATIVE  Final   CULTURE, BLOOD (SINGLE)     Status: Normal   Collection Time   06/30/11 12:30 AM      Component Value Range Status Comment   Specimen Description Blood PORTA CATH   Final    Special Requests     Final    Value: BOTTLES DRAWN AEROBIC AND ANAEROBIC 7CC DRAWN BY RN   Culture  Setup Time [id number]   Final    Culture     Final    Value: METHICILLIN RESISTANT STAPHYLOCOCCUS AUREUS     Note: RIFAMPIN AND GENTAMICIN SHOULD NOT BE USED AS SINGLE DRUGS FOR TREATMENT OF STAPH INFECTIONS. This organism DOES NOT demonstrate inducible Clindamycin resistance in vitro. CRITICAL RESULT CALLED TO, READ BACK BY AND VERIFIED WITH: [facility] MURRELL      07/01/11 1315 BY [email or web address]     Note: Gram Stain Report Called to,Read Back By and Verified With: JARRELL W. AT 12PM ON [id number] BY THOMPSON S. Performed at [facility]   Report Status 07/02/2011 FINAL   Final    Organism ID, Bacteria METHICILLIN RESISTANT STAPHYLOCOCCUS AUREUS   Final   STOOL CULTURE     Status: Normal (Preliminary result)   Collection Time   06/30/11 10:45 AM      Component Value Range Status Comment   Specimen Description STOOL   Final    Special Requests NONE   Final    Culture Culture reincubated for better growth   Final    Report Status PENDING   Incomplete   CLOSTRIDIUM DIFFICILE BY PCR     Status: Normal   Collection Time   06/30/11 10:45 AM      Component Value Range Status Comment   C difficile by pcr NEGATIVE  NEGATIVE  Final      Basic Metabolic Panel:  Basename 07/02/11 0606 07/01/11 0545 06/30/11 0501  NA 133* 129* --  K 3.7 5.1 --  CL 95* 90* --  CO2 25 23 --  GLUCOSE 113* 99 --  BUN 22 45* --  CREATININE 5.22* 8.08* --  CALCIUM 9.1 9.6 --  MG -- -- --  PHOS 6.6* -- 7.1*   Liver Function Tests:  Basename 06/30/11 0501  AST 69*  ALT 33  ALKPHOS 131*  BILITOT 0.2*  PROT 7.1  ALBUMIN 2.7*     CBC:  Basename 07/02/11  0606 07/01/11 0545  WBC 6.4 7.3  NEUTROABS -- --  HGB 10.3* 9.5*  HCT 31.3* 28.7*  MCV 94.8 94.4  PLT 223 189    Ct Abdomen Pelvis W Contrast  06/30/2011  *RADIOLOGY REPORT*  Clinical Data: Abdominal pain and fever.  End-stage renal disease.  CT ABDOMEN AND PELVIS WITH CONTRAST  Technique:  Multidetector CT imaging of the abdomen and pelvis was performed following the standard protocol during bolus administration of intravenous contrast.  Contrast: OMNIPAQUE IOHEXOL 300 MG/ML  SOLN  Comparison: 06/28/2011  Findings: There is prominent hepatomegaly.  No focal lesions in the liver.  No dilated bile ducts.  Gallbladder is normal.  The spleen, pancreas, adrenal glands, and kidneys are normal. Terminal ileum and appendix are normal.  Small bowel is normal.  Again noted is a 3.7 cm cyst on the right ovary, unchanged.  Uterus is normal.  Left ovary is not discretely identified.  There is slight prominence of the mucosa of the distal  colon but the colon is collapsed.  There is no pericolonic inflammation.  No acute osseous abnormalities.  IMPRESSION:  1.  Hepatomegaly. 2.  Stable cyst on the right ovary. 3.  Slight prominence of the mucosa of the distal colon. Distal colon is decompressed in this may be purely be due to the lack of distention but colitis should be considered.  Original Report Authenticated By: Gwynn Burly, M.D.      Medications: I have reviewed the patient's current medications.  Impression: 1. MRSA bacteremia. 2. End-stage renal disease on hemodialysis.Marland Kitchen 3. 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     Plan: 1. Continue with intravenous vancomycin. 2. Remove Mediport per surgery.    LOS: 4 days   Wilson Singer Pager [phone]  07/02/2011, 8:37 AM

## 2011-07-03 ENCOUNTER — Encounter (HOSPITAL_COMMUNITY): Payer: Self-pay | Admitting: *Deleted

## 2011-07-03 ENCOUNTER — Inpatient Hospital Stay (HOSPITAL_COMMUNITY): Payer: Medicaid Other

## 2011-07-03 DIAGNOSIS — A4102 Sepsis due to Methicillin resistant Staphylococcus aureus: Secondary | ICD-10-CM

## 2011-07-03 DIAGNOSIS — R109 Unspecified abdominal pain: Secondary | ICD-10-CM

## 2011-07-03 DIAGNOSIS — N186 End stage renal disease: Secondary | ICD-10-CM

## 2011-07-03 LAB — GLUCOSE, CAPILLARY
Glucose-Capillary: 120 mg/dL — ABNORMAL HIGH (ref 70–99)
Glucose-Capillary: 153 mg/dL — ABNORMAL HIGH (ref 70–99)
Glucose-Capillary: 161 mg/dL — ABNORMAL HIGH (ref 70–99)
Glucose-Capillary: 169 mg/dL — ABNORMAL HIGH (ref 70–99)

## 2011-07-03 LAB — SURGICAL PCR SCREEN
MRSA, PCR: POSITIVE — AB
Staphylococcus aureus: POSITIVE — AB

## 2011-07-03 LAB — BASIC METABOLIC PANEL
BUN: 37 mg/dL — ABNORMAL HIGH (ref 6–23)
Calcium: 9.2 mg/dL (ref 8.4–10.5)
Chloride: 93 mEq/L — ABNORMAL LOW (ref 96–112)
Creatinine, Ser: 7.5 mg/dL — ABNORMAL HIGH (ref 0.50–1.10)
GFR calc Af Amer: 8 mL/min — ABNORMAL LOW (ref >90)

## 2011-07-03 LAB — CBC
HCT: 28.8 % — ABNORMAL LOW (ref 36.0–46.0)
MCHC: 33 g/dL (ref 30.0–36.0)
Platelets: 219 10*3/uL (ref 150–400)
RDW: 14.4 % (ref 11.5–15.5)
WBC: 7 10*3/uL (ref 4.0–10.5)

## 2011-07-03 NOTE — Progress Notes (Signed)
Subjective: This lady continues to improve. She is tolerating a diet. Her abdominal pain is nonsignificant now. Yesterday apparently she refused to have the Port-A-Cath and the dialysis catheter removed per Dr. Lovell Sheehan. This morning I spoke to her and she is now willing to consider this.          Physical Exam: Blood pressure 116/76, pulse 108, temperature 98.9 F (37.2 C), temperature source Oral, resp. rate 19, height 5\' 8"  (1.727 m), weight 64.3 kg (141 lb 12.1 oz), SpO2 98.00%. She looks chronically sick but is not toxic or septic clinically. Heart sounds are present with a resting tachycardia, sinus rhythm at 100. Lung fields are clear abdomen is soft and tender in the left lower quadrant. She is alert and orientated without any focal neurological signs.   Investigations:  Recent Results (from the past 240 hour(s))  MRSA PCR SCREENING     Status: Abnormal   Collection Time   06/29/11  1:10 AM      Component Value Range Status Comment   MRSA by PCR POSITIVE (*) NEGATIVE  Final   CULTURE, BLOOD (SINGLE)     Status: Normal   Collection Time   06/30/11 12:30 AM      Component Value Range Status Comment   Specimen Description Blood PORTA CATH   Final    Special Requests     Final    Value: BOTTLES DRAWN AEROBIC AND ANAEROBIC 7CC DRAWN BY RN   Culture  Setup Time [id number]   Final    Culture     Final    Value: METHICILLIN RESISTANT STAPHYLOCOCCUS AUREUS     Note: RIFAMPIN AND GENTAMICIN SHOULD NOT BE USED AS SINGLE DRUGS FOR TREATMENT OF STAPH INFECTIONS. This organism DOES NOT demonstrate inducible Clindamycin resistance in vitro. CRITICAL RESULT CALLED TO, READ BACK BY AND VERIFIED WITH: [facility] MURRELL      07/01/11 1315 BY [email or web address]     Note: Gram Stain Report Called to,Read Back By and Verified With: JARRELL W. AT 12PM ON [id number] BY THOMPSON S. Performed at [facility]   Report Status 07/02/2011 FINAL   Final    Organism ID, Bacteria METHICILLIN RESISTANT  STAPHYLOCOCCUS AUREUS   Final   STOOL CULTURE     Status: Normal (Preliminary result)   Collection Time   06/30/11 10:45 AM      Component Value Range Status Comment   Specimen Description STOOL   Final    Special Requests NONE   Final    Culture NO SUSPICIOUS COLONIES, CONTINUING TO HOLD   Final    Report Status PENDING   Incomplete   CLOSTRIDIUM DIFFICILE BY PCR     Status: Normal   Collection Time   06/30/11 10:45 AM      Component Value Range Status Comment   C difficile by pcr NEGATIVE  NEGATIVE  Final   MRSA PCR SCREENING     Status: Abnormal   Collection Time   07/02/11  9:09 AM      Component Value Range Status Comment   MRSA by PCR POSITIVE (*) NEGATIVE  Final      Basic Metabolic Panel:  Basename 07/03/11 0513 07/02/11 0606  NA 133* 133*  K 4.0 3.7  CL 93* 95*  CO2 25 25  GLUCOSE 166* 113*  BUN 37* 22  CREATININE 7.50* 5.22*  CALCIUM 9.2 9.1  MG -- --  PHOS 7.7* 6.6*       CBC:  Basename 07/03/11 0513 07/02/11 0606  WBC 7.0 6.4  NEUTROABS -- --  HGB 9.5* 10.3*  HCT 28.8* 31.3*  MCV 95.0 94.8  PLT 219 223        Medications: I have reviewed the patient's current medications.  Impression: 1. MRSA bacteremia, obtained from the Port-A-Cath. 2. End-stage renal disease on hemodialysis.Marland Kitchen 3. 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     Plan: 1. Continue with intravenous vancomycin. 2. I will speak again with Dr. Lovell Sheehan in Dr Fausto Skillern about removal of lines. 3. Discontinue telemetry and mobilize patient.   LOS: 5 days   Wilson Singer Pager [phone]  07/03/2011, 7:47 AM

## 2011-07-03 NOTE — Progress Notes (Addendum)
[name]  MRN: [id number]  DOB/AGE: 1985-10-03 26 y.o.  Primary Care Physician:HASANAJ,XAJE A, MD, MD  Admit date: 06/28/2011  Chief Complaint:  Chief Complaint  Patient presents with  . Emesis  . Abdominal Pain    S-Pt presented on  06/28/2011 with  Chief Complaint  Patient presents with  . Emesis  . Abdominal Pain  .    Pt today feels better.:Pt is asking questions about removal of the Mediport and Permacath-Pt agreeable now.   Meds    . amLODipine  5 mg Oral QHS  . Chlorhexidine Gluconate Cloth  6 each Topical Q0600  . cloNIDine  0.3 mg Transdermal Weekly  . dextrose  25 g Intravenous Once  . epoetin alfa  12,000 Units Intravenous 3 times weekly  . HYDROmorphone  1 mg Intravenous Once  . insulin aspart  0-9 Units Subcutaneous Q4H  . insulin aspart  10 Units Intravenous Once  . labetalol  300 mg Oral TID  . levETIRAcetam  500 mg Oral BID  . mupirocin ointment  1 application Nasal BID  . sodium chloride  3 mL Intravenous Q12H  . sodium chloride      . vancomycin  750 mg Intravenous Q M,W,F-HD         WUJ:WJXBJ from the symptoms mentioned above,there are no other symptoms referable to all systems reviewed.  Physical Exam: Vital signs in last 24 hours: Temp:  [98.2 F (36.8 C)-98.9 F (37.2 C)] 98.2 F (36.8 C) (05/29 0938) Pulse Rate:  [108-113] 111  (05/29 0938) Resp:  [16-19] 18  (05/29 0938) BP: (96-128)/(63-83) 128/83 mmHg (05/29 0938) SpO2:  [97 %-100 %] 97 % (05/29 0938) Weight:  [141 lb 12.1 oz (64.3 kg)] 141 lb 12.1 oz (64.3 kg) (05/29 0300) Weight change: 3 lb 4.9 oz (1.5 kg) Last BM Date: 07/01/11   Physical Exam: General- pt is awake,alert, oriented to time place and person Resp- No acute REsp distress, Decreased Bs at bases. CVS- S1S2 regular in rate and rhythm GIT- BS+, soft, NT, ND EXT- NO LE Edema, Cyanosis Right PC    Lab Results: CBC  Basename 07/03/11 0513 07/02/11 0606  WBC 7.0 6.4  HGB 9.5* 10.3*  HCT 28.8* 31.3*    PLT 219 223    BMET  Basename 07/03/11 0513 07/02/11 0606  NA 133* 133*  K 4.0 3.7  CL 93* 95*  CO2 25 25  GLUCOSE 166* 113*  BUN 37* 22  CREATININE 7.50* 5.22*  CALCIUM 9.2 9.1    MICRO Recent Results (from the past 240 hour(s))  MRSA PCR SCREENING     Status: Abnormal   Collection Time   06/29/11  1:10 AM      Component Value Range Status Comment   MRSA by PCR POSITIVE (*) NEGATIVE  Final   CULTURE, BLOOD (SINGLE)     Status: Normal   Collection Time   06/30/11 12:30 AM      Component Value Range Status Comment   Specimen Description Blood PORTA CATH   Final    Special Requests     Final    Value: BOTTLES DRAWN AEROBIC AND ANAEROBIC 7CC DRAWN BY RN   Culture  Setup Time [id number]   Final    Culture     Final    Value: METHICILLIN RESISTANT STAPHYLOCOCCUS AUREUS     Note: RIFAMPIN AND GENTAMICIN SHOULD NOT BE USED AS SINGLE DRUGS FOR TREATMENT OF STAPH INFECTIONS. This organism DOES NOT demonstrate inducible Clindamycin resistance in vitro.  CRITICAL RESULT CALLED TO, READ BACK BY AND VERIFIED WITH: [facility] MURRELL      07/01/11 1315 BY SMITHERSJ     Note: Gram Stain Report Called to,Read Back By and Verified With: JARRELL W. AT 12PM ON [id number] BY THOMPSON S. Performed at [facility]   Report Status 07/02/2011 FINAL   Final    Organism ID, Bacteria METHICILLIN RESISTANT STAPHYLOCOCCUS AUREUS   Final   STOOL CULTURE     Status: Normal (Preliminary result)   Collection Time   06/30/11 10:45 AM      Component Value Range Status Comment   Specimen Description STOOL   Final    Special Requests NONE   Final    Culture NO SUSPICIOUS COLONIES, CONTINUING TO HOLD   Final    Report Status PENDING   Incomplete   CLOSTRIDIUM DIFFICILE BY PCR     Status: Normal   Collection Time   06/30/11 10:45 AM      Component Value Range Status Comment   C difficile by pcr NEGATIVE  NEGATIVE  Final   MRSA PCR SCREENING     Status: Abnormal   Collection Time   07/02/11  9:09 AM       Component Value Range Status Comment   MRSA by PCR POSITIVE (*) NEGATIVE  Final       Lab Results  Component Value Date   CALCIUM 9.2 07/03/2011   CAION 1.16 06/04/2010   PHOS 7.7* 07/03/2011               Impression: 1)Renal   ESRD on HD Pt on MWF schedule Will dialyze today  2)HTN Target Organ damage  CKD Hypertrophic Cardiomyopathy  Medication- On Calcium Channel Blockers On Alpha and beta Blockers On Central Acting Sympatholytics- Clonidine   3)Anemia HGb at goal (9--11) On Epo  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 Vitamin 25-OH will check/low/at goal.  5)ID- MRSA Bactremia - on Iv vanco  6)FEN  Normokalemic Hyponatremic-secondary to ESRD   7)Acid base Co2 at goal     Plan:  Will dialyze 3 k bath Will try to take 1 liter off Will follow BMP,PO4 and PTH Pt now agreeable for removing  Portacath and PC-  Will hope that she is in same frame when Dr Lovell Sheehan come to see. Will hope for A)Removal of both PC and porta cath B)Then placement of temporary cath on Friday  C)Then placement of long term PC and /portacath after culture negative   Addendum Pt seen on Dialysis Pt tolerating the treatment well.     Evadne Ose S 07/03/2011, 10:34 AM

## 2011-07-04 ENCOUNTER — Encounter (HOSPITAL_COMMUNITY)
Admission: EM | Disposition: A | Discharge status: Home / Self Care | Payer: Self-pay | Source: Home / Self Care | Attending: Internal Medicine

## 2011-07-04 ENCOUNTER — Encounter (HOSPITAL_COMMUNITY): Payer: Self-pay | Admitting: *Deleted

## 2011-07-04 DIAGNOSIS — A4102 Sepsis due to Methicillin resistant Staphylococcus aureus: Secondary | ICD-10-CM

## 2011-07-04 DIAGNOSIS — R109 Unspecified abdominal pain: Secondary | ICD-10-CM

## 2011-07-04 DIAGNOSIS — N186 End stage renal disease: Secondary | ICD-10-CM

## 2011-07-04 HISTORY — PX: PORT-A-CATH REMOVAL: SHX5289

## 2011-07-04 LAB — GLUCOSE, CAPILLARY
Glucose-Capillary: 127 mg/dL — ABNORMAL HIGH (ref 70–99)
Glucose-Capillary: 164 mg/dL — ABNORMAL HIGH (ref 70–99)
Glucose-Capillary: 191 mg/dL — ABNORMAL HIGH (ref 70–99)

## 2011-07-04 LAB — COMPREHENSIVE METABOLIC PANEL
AST: 27 U/L (ref 0–37)
Albumin: 2.6 g/dL — ABNORMAL LOW (ref 3.5–5.2)
Calcium: 9.6 mg/dL (ref 8.4–10.5)
Creatinine, Ser: 5.25 mg/dL — ABNORMAL HIGH (ref 0.50–1.10)

## 2011-07-04 LAB — CBC
HCT: 29.3 % — ABNORMAL LOW (ref 36.0–46.0)
Hemoglobin: 9.6 g/dL — ABNORMAL LOW (ref 12.0–15.0)
MCH: 31.6 pg (ref 26.0–34.0)
MCV: 96.4 fL (ref 78.0–100.0)
RBC: 3.04 MIL/uL — ABNORMAL LOW (ref 3.87–5.11)

## 2011-07-04 LAB — STOOL CULTURE

## 2011-07-04 LAB — PHOSPHORUS: Phosphorus: 5.2 mg/dL — ABNORMAL HIGH (ref 2.3–4.6)

## 2011-07-04 SURGERY — MINOR REMOVAL PORT-A-CATH
Anesthesia: LOCAL | Laterality: Left

## 2011-07-04 MED ORDER — LIDOCAINE HCL (PF) 1 % IJ SOLN
INTRAMUSCULAR | Status: AC
  Filled 2011-07-04: qty 30

## 2011-07-04 MED ORDER — OXYCODONE HCL 5 MG PO TABS
10.0000 mg | ORAL_TABLET | ORAL | Status: DC | PRN
  Administered 2011-07-04 – 2011-07-05 (×5): 10 mg via ORAL
  Filled 2011-07-04 (×5): qty 2

## 2011-07-04 MED ORDER — LIDOCAINE HCL (PF) 1 % IJ SOLN
INTRAMUSCULAR | Status: DC | PRN
  Administered 2011-07-04: 8 mL

## 2011-07-04 MED ORDER — SENNOSIDES-DOCUSATE SODIUM 8.6-50 MG PO TABS
1.0000 | ORAL_TABLET | Freq: Two times a day (BID) | ORAL | Status: DC
  Administered 2011-07-04 – 2011-07-09 (×8): 1 via ORAL
  Filled 2011-07-04 (×11): qty 1

## 2011-07-04 SURGICAL SUPPLY — 4 items
DERMABOND ADVANCED (GAUZE/BANDAGES/DRESSINGS) ×1
DERMABOND ADVANCED .7 DNX12 (GAUZE/BANDAGES/DRESSINGS) ×1 IMPLANT
SUT VICRYL AB 3-0 FS1 BRD 27IN (SUTURE) ×2 IMPLANT
SWAB CULTURE LIQ STUART DBL (MISCELLANEOUS) ×4 IMPLANT

## 2011-07-04 NOTE — Progress Notes (Signed)
Subjective: This lady is actually feeling better .She has grown MRSA in her blood. She is on intravenous vancomycin. Ciprofloxacin and metronidazole were discontinued. She is due to have removal of the Port-A-Cath and the dialysis catheter both today by Dr. Lovell Sheehan.          Physical Exam: Blood pressure 114/67, pulse 111, temperature 98.6 F (37 C), temperature source Oral, resp. rate 18, height 5\' 8"  (1.727 m), weight 66.18 kg (145 lb 14.4 oz), SpO2 99.00%. She looks chronically sick but is not toxic or septic clinically. Heart sounds are present with a resting tachycardia, sinus rhythm at 100. Lung fields are clear abdomen is soft and tender in the left lower quadrant. She is alert and orientated without any focal neurological signs.   Investigations:  Recent Results (from the past 240 hour(s))  MRSA PCR SCREENING     Status: Abnormal   Collection Time   06/29/11  1:10 AM      Component Value Range Status Comment   MRSA by PCR POSITIVE (*) NEGATIVE  Final   CULTURE, BLOOD (SINGLE)     Status: Normal   Collection Time   06/30/11 12:30 AM      Component Value Range Status Comment   Specimen Description Blood PORTA CATH   Final    Special Requests     Final    Value: BOTTLES DRAWN AEROBIC AND ANAEROBIC 7CC DRAWN BY RN   Culture  Setup Time [id number]   Final    Culture     Final    Value: METHICILLIN RESISTANT STAPHYLOCOCCUS AUREUS     Note: RIFAMPIN AND GENTAMICIN SHOULD NOT BE USED AS SINGLE DRUGS FOR TREATMENT OF STAPH INFECTIONS. This organism DOES NOT demonstrate inducible Clindamycin resistance in vitro. CRITICAL RESULT CALLED TO, READ BACK BY AND VERIFIED WITH: [facility] MURRELL      07/01/11 1315 BY [email or web address]     Note: Gram Stain Report Called to,Read Back By and Verified With: JARRELL W. AT 12PM ON [id number] BY THOMPSON S. Performed at [facility]   Report Status 07/02/2011 FINAL   Final    Organism ID, Bacteria METHICILLIN RESISTANT STAPHYLOCOCCUS AUREUS   Final     STOOL CULTURE     Status: Normal (Preliminary result)   Collection Time   06/30/11 10:45 AM      Component Value Range Status Comment   Specimen Description STOOL   Final    Special Requests NONE   Final    Culture NO SUSPICIOUS COLONIES, CONTINUING TO HOLD   Final    Report Status PENDING   Incomplete   CLOSTRIDIUM DIFFICILE BY PCR     Status: Normal   Collection Time   06/30/11 10:45 AM      Component Value Range Status Comment   C difficile by pcr NEGATIVE  NEGATIVE  Final   MRSA PCR SCREENING     Status: Abnormal   Collection Time   07/02/11  9:09 AM      Component Value Range Status Comment   MRSA by PCR POSITIVE (*) NEGATIVE  Final   SURGICAL PCR SCREEN     Status: Abnormal   Collection Time   07/03/11  6:26 PM      Component Value Range Status Comment   MRSA, PCR POSITIVE (*) NEGATIVE  Final    Staphylococcus aureus POSITIVE (*) NEGATIVE  Final      Basic Metabolic Panel:  Basename 07/04/11 0514 07/03/11 0513  NA 132* 133*  K 3.9 4.0  CL 95* 93*  CO2 27 25  GLUCOSE 132* 166*  BUN 23 37*  CREATININE 5.25* 7.50*  CALCIUM 9.6 9.2  MG -- --  PHOS 5.2* 7.7*   Liver Function Tests:  [facility] 07/04/11 0514  AST 27  ALT 20  ALKPHOS 136*  BILITOT 0.2*  PROT 7.3  ALBUMIN 2.6*     CBC:  Basename 07/04/11 0514 07/03/11 0513  WBC 7.1 7.0  NEUTROABS -- --  HGB 9.6* 9.5*  HCT 29.3* 28.8*  MCV 96.4 95.0  PLT 224 219        Medications: I have reviewed the patient's current medications.  Impression: 1. MRSA bacteremia-blood was cultured from the Port-A-Cath. 2. End-stage renal disease on hemodialysis.Marland Kitchen 3. 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     Plan: 1. Continue with intravenous vancomycin with dialysis on Mondays, Wednesdays and Fridays. After tomorrow, she will need a further 3 doses next week on Monday, Wednesday and Friday. 2. Remove Mediport and dialysis catheter today by Dr. Lovell Sheehan. 3. Insertion of femoral catheter  tomorrow by Dr. Lovell Sheehan for IV access and dialysis. 4. The plan is to have placement of long-term dialysis catheter and the Port-A-Cath after culture negative. Note that the AV fistula on the right side is occluded, eventually she may need an AV fistula created, possibly on the left side. Dr. Fabienne Bruns is the vascular surgeon who would be involved in this. 5. Disposition-she will need intravenous vancomycin with dialysis next week. After the course is finished, blood cultures need to be repeated and if negative, then long-term placement of dialysis catheter and Port-A-Cath can be done. If she is stable, I think she should be able to be discharged tomorrow.    LOS: 6 days   Wilson Singer Pager [phone]  07/04/2011, 7:19 AM

## 2011-07-04 NOTE — Progress Notes (Signed)
07/04/11 1518 Patient had BP 98/64 manually this afternoon, notified Dr Karilyn Cota, stated okay to hold afternoon dose of labetalol. Nursing to monitor.

## 2011-07-04 NOTE — Op Note (Signed)
Patient:  [name]  DOB:  Jun 06, 1985  MRN:  [id number]   Preop Diagnosis:  Chronic renal failure, MRSA bacteremia  Postop Diagnosis:  Same  Procedure:  Power port removal, dialysis catheter removal  Surgeon:  Franky Macho, M.D.  Anes:  Local  Indications:  Patient is a 26 year old black female with chronic renal failure, on dialysis, who presents with MRSA bacteremia from a blood draw at the power port site. She now presents to the minor procedure room for removal of her power port and her dialysis catheter. The risks and benefits of the procedure were fully explained to the patient, gave informed consent.  Procedure note:  Patient's placed the supine position. The upper chest was prepped and draped using usual sterile technique with DuraPrep. Surgical site confirmation was performed. 1% Xylocaine was used for local anesthesia.  An incision was made in the left upper chest where the power port was. This was through a previous surgical scar. Dissection was taken down to the power port and the port removed in total without difficulty. The tip was sent off for culture. The port was disposed of. The subcutaneous layer was reapproximated using 3-0 Vicryl interrupted suture. The skin was closed using a 4 Vicryl subcuticular suture. Dermabond was then applied.  Next, the right sided dialysis catheter was then removed without difficulty. The tip was sent to microbiology further examination. Pressure was held for approximately 5 minutes. No abnormal bleeding or hematoma was noted at the end of the procedure. A pressure dressing was applied.  All tape and needle counts were correct the end of the procedure. The patient was transferred back to her room in stable condition.  Complications:  None  EBL:  Minimal  Specimen:  Cultures of both dialysis catheter and power port tips

## 2011-07-05 ENCOUNTER — Inpatient Hospital Stay (HOSPITAL_COMMUNITY): Payer: Medicaid Other

## 2011-07-05 DIAGNOSIS — R0602 Shortness of breath: Secondary | ICD-10-CM

## 2011-07-05 DIAGNOSIS — A4102 Sepsis due to Methicillin resistant Staphylococcus aureus: Secondary | ICD-10-CM

## 2011-07-05 DIAGNOSIS — I959 Hypotension, unspecified: Secondary | ICD-10-CM

## 2011-07-05 DIAGNOSIS — N186 End stage renal disease: Secondary | ICD-10-CM

## 2011-07-05 LAB — BLOOD GAS, ARTERIAL
Bicarbonate: 27.3 mEq/L — ABNORMAL HIGH (ref 20.0–24.0)
FIO2: 50 %
Patient temperature: 37
pH, Arterial: 7.403 — ABNORMAL HIGH (ref 7.350–7.400)

## 2011-07-05 LAB — PTH, INTACT AND CALCIUM
Calcium, Total (PTH): 8.7 mg/dL (ref 8.4–10.5)
PTH: 256 pg/mL — ABNORMAL HIGH (ref 14.0–72.0)

## 2011-07-05 LAB — BASIC METABOLIC PANEL
BUN: 35 mg/dL — ABNORMAL HIGH (ref 6–23)
Chloride: 93 mEq/L — ABNORMAL LOW (ref 96–112)
GFR calc Af Amer: 8 mL/min — ABNORMAL LOW (ref >90)
Potassium: 4.5 mEq/L (ref 3.5–5.1)

## 2011-07-05 LAB — CBC
HCT: 32.1 % — ABNORMAL LOW (ref 36.0–46.0)
Hemoglobin: 10.3 g/dL — ABNORMAL LOW (ref 12.0–15.0)
MCHC: 32.1 g/dL (ref 30.0–36.0)
RBC: 3.32 MIL/uL — ABNORMAL LOW (ref 3.87–5.11)

## 2011-07-05 LAB — GLUCOSE, CAPILLARY: Glucose-Capillary: 197 mg/dL — ABNORMAL HIGH (ref 70–99)

## 2011-07-05 MED ORDER — SODIUM CHLORIDE 0.9 % IJ SOLN
10.0000 mL | Freq: Two times a day (BID) | INTRAMUSCULAR | Status: DC
  Administered 2011-07-05 – 2011-07-06 (×2): 20 mL
  Administered 2011-07-06 – 2011-07-07 (×2): 10 mL
  Administered 2011-07-07: 20 mL
  Administered 2011-07-09: 10 mL
  Filled 2011-07-05 (×3): qty 3

## 2011-07-05 MED ORDER — HEPARIN (PORCINE) IN NACL 100-0.45 UNIT/ML-% IJ SOLN
1150.0000 [IU]/h | INTRAMUSCULAR | Status: DC
  Administered 2011-07-05: 1000 [IU]/h via INTRAVENOUS
  Filled 2011-07-05: qty 250

## 2011-07-05 MED ORDER — HEPARIN SODIUM (PORCINE) 1000 UNIT/ML DIALYSIS
1000.0000 [IU] | INTRAMUSCULAR | Status: DC | PRN
  Filled 2011-07-05: qty 1

## 2011-07-05 MED ORDER — LABETALOL HCL 200 MG PO TABS
300.0000 mg | ORAL_TABLET | Freq: Three times a day (TID) | ORAL | Status: DC
  Administered 2011-07-05 – 2011-07-10 (×9): 300 mg via ORAL
  Filled 2011-07-05 (×10): qty 2

## 2011-07-05 MED ORDER — TECHNETIUM TO 99M ALBUMIN AGGREGATED
3.0000 | Freq: Once | INTRAVENOUS | Status: AC | PRN
  Administered 2011-07-05: 3 via INTRAVENOUS

## 2011-07-05 MED ORDER — SODIUM CHLORIDE 0.9 % IJ SOLN
10.0000 mL | INTRAMUSCULAR | Status: DC | PRN
  Filled 2011-07-05: qty 9
  Filled 2011-07-05 (×2): qty 3

## 2011-07-05 MED ORDER — AMLODIPINE BESYLATE 5 MG PO TABS
5.0000 mg | ORAL_TABLET | Freq: Every day | ORAL | Status: DC
  Administered 2011-07-05: 5 mg via ORAL
  Filled 2011-07-05: qty 1

## 2011-07-05 MED ORDER — HEPARIN SODIUM (PORCINE) 1000 UNIT/ML DIALYSIS
20.0000 [IU]/kg | INTRAMUSCULAR | Status: DC | PRN
  Administered 2011-07-06: 1300 [IU] via INTRAVENOUS_CENTRAL
  Filled 2011-07-05: qty 2

## 2011-07-05 MED ORDER — HEPARIN BOLUS VIA INFUSION
3500.0000 [IU] | Freq: Once | INTRAVENOUS | Status: AC
  Administered 2011-07-05: 3500 [IU] via INTRAVENOUS
  Filled 2011-07-05: qty 3500

## 2011-07-05 NOTE — Consult Note (Signed)
ANTIBIOTIC CONSULT NOTE - Follow-up  Pharmacy Consult for Vancomycin Indication: bacteremia  Allergies  Allergen Reactions  . Ancef (Cefazolin Sodium) Shortness Of Breath    wheezing  . Coumadin (Warfarin Sodium)     Eyes bleed  . Morphine And Related Itching  . Sulfa Antibiotics Anaphylaxis and Shortness Of Breath  . Tomato Hives and Swelling   Patient Measurements: Height: 5\' 8"  (172.7 cm) Weight: 145 lb 14.4 oz (66.18 kg) IBW/kg (Calculated) : 63.9   Vital Signs: Temp: 97 F (36.1 C) (05/31 0532) Temp src: Oral (05/31 0532) BP: 116/75 mmHg (05/31 0532) Pulse Rate: 100  (05/31 0532) Intake/Output from previous day: 05/30 0701 - 05/31 0700 In: 60 [P.O.:60] Out: -  Intake/Output from this shift: Total I/O In: 60 [P.O.:60] Out: -   Labs:  Basename 07/05/11 0610 07/04/11 0514 07/03/11 0513  WBC 5.8 7.1 7.0  HGB 10.3* 9.6* 9.5*  PLT 274 224 219  LABCREA -- -- --  CREATININE 7.14* 5.25* 7.50*   Estimated Creatinine Clearance: 12.2 ml/min (by C-G formula based on Cr of 7.14). No results found for this basename: VANCOTROUGH:2,VANCOPEAK:2,VANCORANDOM:2,GENTTROUGH:2,GENTPEAK:2,GENTRANDOM:2,TOBRATROUGH:2,TOBRAPEAK:2,TOBRARND:2,AMIKACINPEAK:2,AMIKACINTROU:2,AMIKACIN:2, in the last 72 hours   Microbiology: Recent Results (from the past 720 hour(s))  MRSA PCR SCREENING     Status: Abnormal   Collection Time   06/29/11  1:10 AM      Component Value Range Status Comment   MRSA by PCR POSITIVE (*) NEGATIVE  Final   CULTURE, BLOOD (SINGLE)     Status: Normal   Collection Time   06/30/11 12:30 AM      Component Value Range Status Comment   Specimen Description Blood PORTA CATH   Final    Special Requests     Final    Value: BOTTLES DRAWN AEROBIC AND ANAEROBIC 7CC DRAWN BY RN   Culture  Setup Time [id number]   Final    Culture     Final    Value: METHICILLIN RESISTANT STAPHYLOCOCCUS AUREUS     Note: RIFAMPIN AND GENTAMICIN SHOULD NOT BE USED AS SINGLE DRUGS FOR TREATMENT  OF STAPH INFECTIONS. This organism DOES NOT demonstrate inducible Clindamycin resistance in vitro. CRITICAL RESULT CALLED TO, READ BACK BY AND VERIFIED WITH: [facility] MURRELL      07/01/11 1315 BY SMITHERSJ     Note: Gram Stain Report Called to,Read Back By and Verified With: JARRELL W. AT 12PM ON [id number] BY THOMPSON S. Performed at [facility]   Report Status 07/02/2011 FINAL   Final    Organism ID, Bacteria METHICILLIN RESISTANT STAPHYLOCOCCUS AUREUS   Final   STOOL CULTURE     Status: Normal   Collection Time   06/30/11 10:45 AM      Component Value Range Status Comment   Specimen Description STOOL   Final    Special Requests NONE   Final    Culture     Final    Value: NO SALMONELLA, SHIGELLA, CAMPYLOBACTER, YERSINIA, OR E.COLI 0157:H7 ISOLATED   Report Status 07/04/2011 FINAL   Final   CLOSTRIDIUM DIFFICILE BY PCR     Status: Normal   Collection Time   06/30/11 10:45 AM      Component Value Range Status Comment   C difficile by pcr NEGATIVE  NEGATIVE  Final   MRSA PCR SCREENING     Status: Abnormal   Collection Time   07/02/11  9:09 AM      Component Value Range Status Comment   MRSA by PCR POSITIVE (*) NEGATIVE  Final  SURGICAL PCR SCREEN     Status: Abnormal   Collection Time   07/03/11  6:26 PM      Component Value Range Status Comment   MRSA, PCR POSITIVE (*) NEGATIVE  Final    Staphylococcus aureus POSITIVE (*) NEGATIVE  Final   CATH TIP CULTURE     Status: Normal (Preliminary result)   Collection Time   07/04/11  8:45 AM      Component Value Range Status Comment   Specimen Description CATH TIP PORTA CATH   Final    Special Requests VANCOMYCIN   Final    Culture NO GROWTH 1 DAY   Final    Report Status PENDING   Incomplete   CATH TIP CULTURE     Status: Normal (Preliminary result)   Collection Time   07/04/11  8:45 AM      Component Value Range Status Comment   Specimen Description CATH TIP HEMODIALYSIS CATHETER   Final    Special Requests NONE   Final    Culture  Culture reincubated for better growth   Final    Report Status PENDING   Incomplete    Medical History: Past Medical History  Diagnosis Date  . Diabetes mellitus     insulin dependent diabetes mellitus with history of poor control  . Hypertension   . Hyperlipidemia   . Nephrotic syndrome   . Anemia     chronic,  Dr. Mariel Sleet,  Transfused, May, 2012  . 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 Hypertrophic cardiomyopathy     with diastolic heart dysfunction  . 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 Vaginosis   . History of MRSA infection     multiple  . Glaucoma   . Warfarin anticoagulation     DVT  . Ejection fraction     55%, echo 12/2009  . Manic depressive disorder   . Noncompliance   . port-a-cath   . Dialysis patient     M-W-F   Medications:  Scheduled:     . amLODipine  5 mg Oral QHS  . Chlorhexidine Gluconate Cloth  6 each Topical Q0600  . cloNIDine  0.3 mg Transdermal Weekly  . dextrose  25 g Intravenous Once  . epoetin alfa  12,000 Units Intravenous 3 times weekly  . HYDROmorphone  1 mg Intravenous Once  . insulin aspart  0-9 Units Subcutaneous Q4H  . insulin aspart  10 Units Intravenous Once  . labetalol  300 mg Oral TID  . levETIRAcetam  500 mg Oral BID  . lidocaine      . mupirocin ointment  1 application Nasal BID  . senna-docusate  1 tablet Oral BID  . sodium chloride  3 mL Intravenous Q12H  . vancomycin  750 mg Intravenous Q M,W,F-HD   Assessment: 26yo F on day#6 vancomycin for MRSA catheter infection. Catheter was removed yesterday and cx data pending.  Plans for HD defered to Saturday.  She is currently afebrile with normal WBC.  Goal of Therapy:  Eradicate infection. Pre-HD vancomycin level 15-25 mcg/ml  Plan:  1) Vancomycin 750mg  IV after each dialysis-move dose to Saturday. 2) Check pre-HD level tomorrow. 3) Follow-up final cx data & treatment plan.  Elson Clan 07/05/2011,10:52 AM

## 2011-07-05 NOTE — Progress Notes (Signed)
ANTICOAGULATION CONSULT NOTE - Initial Consult  Pharmacy Consult for heparin Indication: rule out PE  Allergies  Allergen Reactions  . Ancef (Cefazolin Sodium) Shortness Of Breath    wheezing  . Coumadin (Warfarin Sodium)     Eyes bleed  . Morphine And Related Itching  . Sulfa Antibiotics Anaphylaxis and Shortness Of Breath  . Tomato Hives and Swelling    Patient Measurements: Height: 5\' 8"  (172.7 cm) Weight: 145 lb 14.4 oz (66.18 kg) IBW/kg (Calculated) : 63.9   Vital Signs: Temp: 97.9 F (36.6 C) (05/31 1540) Temp src: Oral (05/31 1540) BP: 88/60 mmHg (05/31 1616) Pulse Rate: 95  (05/31 1540)  Labs:  Basename 07/05/11 0610 07/04/11 0514 07/03/11 0513  HGB 10.3* 9.6* --  HCT 32.1* 29.3* 28.8*  PLT 274 224 219  APTT -- -- --  LABPROT -- -- --  INR -- -- --  HEPARINUNFRC -- -- --  CREATININE 7.14* 5.25* 7.50*  CKTOTAL -- -- --  CKMB -- -- --  TROPONINI -- -- --    Estimated Creatinine Clearance: 12.2 ml/min (by C-G formula based on Cr of 7.14).   Medical History: Past Medical History  Diagnosis Date  . Diabetes mellitus     insulin dependent diabetes mellitus with history of poor control  . Hypertension   . Hyperlipidemia   . Nephrotic syndrome   . Anemia     chronic,  Dr. Mariel Sleet,  Transfused, May, 2012  . 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 Hypertrophic cardiomyopathy     with diastolic heart dysfunction  . 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 Vaginosis   . History of MRSA infection     multiple  . Glaucoma   . Warfarin anticoagulation     DVT  . Ejection fraction     55%, echo 12/2009  . Manic depressive disorder   . Noncompliance   . port-a-cath   . Dialysis patient     M-W-F    Medications:  Scheduled:    . amLODipine  5 mg Oral QHS  . Chlorhexidine Gluconate Cloth  6 each Topical Q0600  . dextrose  25 g Intravenous Once  . epoetin alfa  12,000 Units Intravenous 3  times weekly  . HYDROmorphone  1 mg Intravenous Once  . insulin aspart  0-9 Units Subcutaneous Q4H  . insulin aspart  10 Units Intravenous Once  . labetalol  300 mg Oral TID  . levETIRAcetam  500 mg Oral BID  . lidocaine      . mupirocin ointment  1 application Nasal BID  . senna-docusate  1 tablet Oral BID  . sodium chloride  3 mL Intravenous Q12H  . vancomycin  750 mg Intravenous Q M,W,F-HD  . DISCONTD: amLODipine  5 mg Oral QHS  . DISCONTD: cloNIDine  0.3 mg Transdermal Weekly  . DISCONTD: labetalol  300 mg Oral TID    Assessment: 26 yo F who has been hospitalized for MRSA line infection since 06/28/2011 to start on IV heparin for possible PE.  CT to rule out PE is pending. She has been on SCDs since admission.  She has ESRD requiring dialysis.  No history of bleeding noted.     Goal of Therapy:  Heparin level 0.3-0.7 units/ml Monitor platelets by anticoagulation protocol: Yes   Plan:  Give 3500 units bolus x 1 Start heparin infusion at 1000 units/hr Check anti-Xa level in 8 hours  and daily while on heparin Continue to monitor H&H and platelets  Elson Clan 07/05/2011,5:02 PM

## 2011-07-05 NOTE — Procedures (Signed)
Central Venous Dialysis Catheter Insertion Procedure Note [name] [id number] 04-12-1985  Procedure: Insertion of Central Venous Catheter Indications: Assessment of intravascular volume, Drug and/or fluid administration and dialysis.  Procedure Details Consent: Risks of procedure as well as the alternatives and risks of each were explained to the (patient/caregiver).  Consent for procedure obtained. Time Out: Verified patient identification, verified procedure, site/side was marked, verified correct patient position, special equipment/implants available, medications/allergies/relevent history reviewed, required imaging and test results available.  Performed  Maximum sterile technique was used including antiseptics, gloves, gown, hand hygiene, mask and sheet. Skin prep: Iodine solution; local anesthetic administered A antimicrobial bonded/coated double lumen catheter was placed in the left femoral vein due to multiple attempts/dialysis patient, no other available access using the Seldinger technique.  Could not thread guide wire in right femoral vein despite multiple attempts.  Evaluation Blood flow good Complications: No apparent complications Patient did tolerate procedure well.   Dillon Livermore A 07/05/2011, 6:11 PM

## 2011-07-05 NOTE — Progress Notes (Signed)
Subjective: Interval History: has no complaint of nausea or vomiting. She denies any difficulty in breathing. Appetite is good. And patient does not have any fever chills or sweating. .  Objective: Vital signs in last 24 hours: Temp:  [97 F (36.1 C)-99.5 F (37.5 C)] 97 F (36.1 C) (05/31 0532) Pulse Rate:  [100-115] 100  (05/31 0532) Resp:  [14-20] 20  (05/31 0532) BP: (81-180)/(55-85) 116/75 mmHg (05/31 0532) SpO2:  [97 %-100 %] 98 % (05/31 0532) Weight change:   Intake/Output from previous day: 05/30 0701 - 05/31 0700 In: 60 [P.O.:60] Out: -  Intake/Output this shift:    General appearance: alert, cooperative and no distress Resp: clear to auscultation bilaterally Cardio: regular rate and rhythm, S1, S2 normal, no murmur, click, rub or gallop GI: soft, non-tender; bowel sounds normal; no masses,  no organomegaly Extremities: extremities normal, atraumatic, no cyanosis or edema  Lab Results:  [facility] 07/05/11 0610 07/04/11 0514  WBC 5.8 7.1  HGB 10.3* 9.6*  HCT 32.1* 29.3*  PLT 274 224   BMET:  Basename 07/05/11 0610 07/04/11 0514  NA 133* 132*  K 4.5 3.9  CL 93* 95*  CO2 26 27  GLUCOSE 215* 132*  BUN 35* 23  CREATININE 7.14* 5.25*  CALCIUM 10.0 9.6   No results found for this basename: PTH:2 in the last 72 hours Iron Studies: No results found for this basename: IRON,TIBC,TRANSFERRIN,FERRITIN in the last 72 hours  Studies/Results: No results found.  I have reviewed the patient's current medications.  Assessment/Plan: Problem #1 end-stage renal disease she status post hemodialysis the day before yesterday her BUN is 35 creatinine 7.14 potassium is 4.5 stable Problem #2 anemia her hemoglobin is 10.3 hematocrit 32.1 on Epogen Problem #3 MRSA sepsis her lines were taken out yesterday culture from a the tip of the catheter is pending. Presently she is a febrile and her white blood cell count is normal. Problem #4 history of hypertension her blood pressure  seems to be reasonably controlled Problem #5 history of nausea vomiting has improved Problem #6 history of diabetes  Problem #7 history of hyperphosphatemia last phosphorus was 5.2 is was in acceptable range. Recommendation we'll make arrangements for patient to get dialysis tomorrow Will follow her blood culture and also  culture of her catheter  tip We'll check her CBC and basic metabolic panel in the morning I have discussed with Dr. Lovell Sheehan for femoral catheter placement tomorrow.   LOS: 7 days   Zahirah Cheslock S 07/05/2011,7:34 AM

## 2011-07-05 NOTE — Progress Notes (Signed)
Patient off floor at this time for VQ scan. Accompanied by NT/MT from ICU which was approved by MD and is currently on portable tele.  VSS patient aware of need for procedure and is stable prior to transfer. Pt informed of study time and duration.

## 2011-07-05 NOTE — Care Management Note (Signed)
    Page 1 of 1   07/09/2011     2:04:50 PM   CARE MANAGEMENT NOTE 07/09/2011  Patient:  [name], [name]   Account Number:  [id number]  Date Initiated:  07/05/2011  Documentation initiated by:  Anibal Henderson  Subjective/Objective Assessment:   Admitted with N&V&Diarrhea, fever, with MRSA + blood cultures, and MRSA + culture of  port-a-cath. Pt is from home with family, is dialysis dependent. She is independent, and will return home.     Action/Plan:   Will follow for needs-   Anticipated DC Date:  07/07/2011   Anticipated DC Plan:  HOME/SELF CARE      DC Planning Services  CM consult      Choice offered to / List presented to:             Status of service:  Completed, signed off Medicare Important Message given?   (If response is "NO", the following Medicare IM given date fields will be blank) Date Medicare IM given:   Date Additional Medicare IM given:    Discharge Disposition:  HOME/SELF CARE  Per UR Regulation:    If discussed at Long Length of Stay Meetings, dates discussed:    Comments:  07/09/11 1000 Andjela Wickes Leanord Hawking RN BSN CM Spoke with pt at bedside. Going to [facility] for Dialysis cath access. Plan to DC if tolerated. Currently followed by Case worker for P4PP. No other needs identified with pt.  07/05/11 1530 Anibal Henderson RN Pt will return home and return to OP dialysis. She will receive IV ABX with her dialysis for 1-2 weeks at dialysis at [facility]

## 2011-07-05 NOTE — Progress Notes (Signed)
1600 pt c/o sob bp 80/60 hr 100 sats 90 to 97% ora dr Kerry Hough paged and rapid response team called.

## 2011-07-05 NOTE — Progress Notes (Signed)
Pt transferred to icu after dr Kerry Hough and rapid response team evaluation

## 2011-07-05 NOTE — Progress Notes (Signed)
Patient back to room at this time. Trip made without incident. VSS, patient alert and oriented

## 2011-07-05 NOTE — Progress Notes (Signed)
Inpatient Diabetes Program Recommendations  AACE/ADA: New Consensus Statement on Inpatient Glycemic Control (2009)  Target Ranges:  Prepandial:   less than 140 mg/dL      Peak postprandial:   less than 180 mg/dL (1-2 hours)      Critically ill patients:  140 - 180 mg/dL   Reason for Visit: Results for EMIYA, LOOMER (MRN [id number]) as of 07/05/2011 11:20  Ref. Range 07/04/2011 20:08 07/04/2011 23:51 07/05/2011 04:11 07/05/2011 06:10 07/05/2011 07:31  Glucose-Capillary Latest Range: 70-99 mg/dL 409 (H) 811 (H) 914 (H)  197 (H)   Note patient was on Lantus prior to admit.  CBG's have been trending up over past 2 days. Patient is currently on Novolog correction q 4 hours only.  Consider adding Lantus 10 units daily (this is less than 1/2 of home dose). Will follow.

## 2011-07-05 NOTE — Progress Notes (Signed)
Subjective: Was called in rapid response today for low blood pressure and shortness of breath.  On my arrival, patient was noted to be lethargic, mumbling her words.  She reports shortness of breath.  Objective: Vital signs in last 24 hours: Temp:  [97 F (36.1 C)-98.5 F (36.9 C)] 97.9 F (36.6 C) (05/31 1540) Pulse Rate:  [95-106] 95  (05/31 1540) Resp:  [18-20] 18  (05/31 1540) BP: (70-180)/(53-75) 88/60 mmHg (05/31 1616) SpO2:  [94 %-99 %] 94 % (05/31 1616) Weight change:  Last BM Date: 07/01/11  Intake/Output from previous day: 05/30 0701 - 05/31 0700 In: 60 [P.O.:60] Out: -  Total I/O In: 300 [P.O.:300] Out: -    Physical Exam: General: lethargic HEENT: No bruits, no goiter. Heart: Regular rate and rhythm, without murmurs, rubs, gallops. Lungs: Clear to auscultation bilaterally. Abdomen: Soft, nontender, nondistended, positive bowel sounds. Extremities: No clubbing cyanosis or edema with positive pedal pulses. Neuro: Grossly intact, nonfocal.    Lab Results: Basic Metabolic Panel:  Basename 07/05/11 0610 07/04/11 0514 07/03/11 0513  NA 133* 132* --  K 4.5 3.9 --  CL 93* 95* --  CO2 26 27 --  GLUCOSE 215* 132* --  BUN 35* 23 --  CREATININE 7.14* 5.25* --  CALCIUM 10.0 8.79.6 --  MG -- -- --  PHOS -- 5.2* 7.7*   Liver Function Tests:  Basename 07/04/11 0514  AST 27  ALT 20  ALKPHOS 136*  BILITOT 0.2*  PROT 7.3  ALBUMIN 2.6*   No results found for this basename: LIPASE:2,AMYLASE:2 in the last 72 hours No results found for this basename: AMMONIA:2 in the last 72 hours CBC:  Basename 07/05/11 0610 07/04/11 0514  WBC 5.8 7.1  NEUTROABS -- --  HGB 10.3* 9.6*  HCT 32.1* 29.3*  MCV 96.7 96.4  PLT 274 224   Cardiac Enzymes: No results found for this basename: CKTOTAL:3,CKMB:3,CKMBINDEX:3,TROPONINI:3 in the last 72 hours BNP: No results found for this basename: PROBNP:3 in the last 72 hours D-Dimer: No results found for this basename: DDIMER:2  in the last 72 hours CBG:  Basename 07/05/11 1618 07/05/11 1122 07/05/11 0731 07/05/11 0411 07/04/11 2351 07/04/11 2008  GLUCAP 280* 193* 197* 196* 191* 257*   Hemoglobin A1C: No results found for this basename: HGBA1C in the last 72 hours Fasting Lipid Panel: No results found for this basename: CHOL,HDL,LDLCALC,TRIG,CHOLHDL,LDLDIRECT in the last 72 hours Thyroid Function Tests: No results found for this basename: TSH,T4TOTAL,FREET4,T3FREE,THYROIDAB in the last 72 hours Anemia Panel: No results found for this basename: VITAMINB12,FOLATE,FERRITIN,TIBC,IRON,RETICCTPCT in the last 72 hours Coagulation: No results found for this basename: LABPROT:2,INR:2 in the last 72 hours Urine Drug Screen: Drugs of Abuse     Component Value Date/Time   LABOPIA POSITIVE* 05/21/2009 2300   COCAINSCRNUR NONE DETECTED 05/21/2009 2300   LABBENZ NONE DETECTED 05/21/2009 2300   AMPHETMU NONE DETECTED 05/21/2009 2300   THCU NONE DETECTED 05/21/2009 2300   LABBARB  Value: NONE DETECTED        DRUG SCREEN FOR MEDICAL PURPOSES ONLY.  IF CONFIRMATION IS NEEDED FOR ANY PURPOSE, NOTIFY LAB WITHIN 5 DAYS.        LOWEST DETECTABLE LIMITS FOR URINE DRUG SCREEN Drug Class       Cutoff (ng/mL) Amphetamine      1000 Barbiturate      200 Benzodiazepine   200 Tricyclics       300 Opiates          300 Cocaine  300 THC              50 05/21/2009 2300    Alcohol Level: No results found for this basename: ETH:2 in the last 72 hours Urinalysis: No results found for this basename: COLORURINE:2,APPERANCEUR:2,LABSPEC:2,PHURINE:2,GLUCOSEU:2,HGBUR:2,BILIRUBINUR:2,KETONESUR:2,PROTEINUR:2,UROBILINOGEN:2,NITRITE:2,LEUKOCYTESUR:2 in the last 72 hours  Recent Results (from the past 240 hour(s))  MRSA PCR SCREENING     Status: Abnormal   Collection Time   06/29/11  1:10 AM      Component Value Range Status Comment   MRSA by PCR POSITIVE (*) NEGATIVE  Final   CULTURE, BLOOD (SINGLE)     Status: Normal   Collection Time   06/30/11  12:30 AM      Component Value Range Status Comment   Specimen Description Blood PORTA CATH   Final    Special Requests     Final    Value: BOTTLES DRAWN AEROBIC AND ANAEROBIC 7CC DRAWN BY RN   Culture  Setup Time [id number]   Final    Culture     Final    Value: METHICILLIN RESISTANT STAPHYLOCOCCUS AUREUS     Note: RIFAMPIN AND GENTAMICIN SHOULD NOT BE USED AS SINGLE DRUGS FOR TREATMENT OF STAPH INFECTIONS. This organism DOES NOT demonstrate inducible Clindamycin resistance in vitro. CRITICAL RESULT CALLED TO, READ BACK BY AND VERIFIED WITH: [facility] MURRELL      07/01/11 1315 BY SMITHERSJ     Note: Gram Stain Report Called to,Read Back By and Verified With: JARRELL W. AT 12PM ON [id number] BY THOMPSON S. Performed at [facility]   Report Status 07/02/2011 FINAL   Final    Organism ID, Bacteria METHICILLIN RESISTANT STAPHYLOCOCCUS AUREUS   Final   STOOL CULTURE     Status: Normal   Collection Time   06/30/11 10:45 AM      Component Value Range Status Comment   Specimen Description STOOL   Final    Special Requests NONE   Final    Culture     Final    Value: NO SALMONELLA, SHIGELLA, CAMPYLOBACTER, YERSINIA, OR E.COLI 0157:H7 ISOLATED   Report Status 07/04/2011 FINAL   Final   CLOSTRIDIUM DIFFICILE BY PCR     Status: Normal   Collection Time   06/30/11 10:45 AM      Component Value Range Status Comment   C difficile by pcr NEGATIVE  NEGATIVE  Final   MRSA PCR SCREENING     Status: Abnormal   Collection Time   07/02/11  9:09 AM      Component Value Range Status Comment   MRSA by PCR POSITIVE (*) NEGATIVE  Final   SURGICAL PCR SCREEN     Status: Abnormal   Collection Time   07/03/11  6:26 PM      Component Value Range Status Comment   MRSA, PCR POSITIVE (*) NEGATIVE  Final    Staphylococcus aureus POSITIVE (*) NEGATIVE  Final   CATH TIP CULTURE     Status: Normal (Preliminary result)   Collection Time   07/04/11  8:45 AM      Component Value Range Status Comment   Specimen  Description CATH TIP PORTA CATH   Final    Special Requests VANCOMYCIN   Final    Culture NO GROWTH 1 DAY   Final    Report Status PENDING   Incomplete   CATH TIP CULTURE     Status: Normal (Preliminary result)   Collection Time   07/04/11  8:45 AM  Component Value Range Status Comment   Specimen Description CATH TIP HEMODIALYSIS CATHETER   Final    Special Requests NONE   Final    Culture Culture reincubated for better growth   Final    Report Status PENDING   Incomplete     Studies/Results: No results found.  Medications: Scheduled Meds:   . amLODipine  5 mg Oral QHS  . Chlorhexidine Gluconate Cloth  6 each Topical Q0600  . cloNIDine  0.3 mg Transdermal Weekly  . dextrose  25 g Intravenous Once  . epoetin alfa  12,000 Units Intravenous 3 times weekly  . HYDROmorphone  1 mg Intravenous Once  . insulin aspart  0-9 Units Subcutaneous Q4H  . insulin aspart  10 Units Intravenous Once  . labetalol  300 mg Oral TID  . levETIRAcetam  500 mg Oral BID  . lidocaine      . mupirocin ointment  1 application Nasal BID  . senna-docusate  1 tablet Oral BID  . sodium chloride  3 mL Intravenous Q12H  . vancomycin  750 mg Intravenous Q M,W,F-HD   Continuous Infusions:  PRN Meds:.bisacodyl, heparin, heparin, lidocaine, ondansetron (ZOFRAN) IV, ondansetron, oxyCODONE, sodium chloride, DISCONTD: heparin, DISCONTD: heparin  Assessment/Plan:  1. Hypotension, unclear etiology, may be due to meds vs. Underlying infection.  Repeat blood cultures and check lactic acid  2. Acute respiratory Failure, with relatively acute onset of symptoms, hypoxia and tachycardia, we will order CT angio of chest to rule out PE and empirically start heparin. Will also check CXR and blood gas.  3. Lethargy, possibly related to hypotension, blood sugar checked and was not low, Will continue to monitor closely  4. MRSA bacteremia, on vancomycin.  Her dialysis catheter and portacath (placed for blood draws) were  removed yesterday.  She needs new dialysis access placed and repeat blood cultures to see if they have cleared.  If they have cleared, patient will be treated for a total of 2 weeks.  5. ESRD on HD.  Dr. [name] is following  6.  DM 1, blood sugars stable  7. IV Access.  Patient currently does not have any IV access and is proving to be a very difficult stick for a peripheral IV.  I have discussed this with Dr. Lovell Sheehan, and he has agreed to place a femoral dialysis catheter today.  We appreciate his assistance.  Patient will be transferred down to stepdown for further monitoring.   Critical care:   LOS: 7 days   Rashawna Scoles [facility] Pager: [phone] 07/05/2011, 4:36 PM

## 2011-07-06 ENCOUNTER — Inpatient Hospital Stay (HOSPITAL_COMMUNITY): Payer: Medicaid Other

## 2011-07-06 DIAGNOSIS — A4102 Sepsis due to Methicillin resistant Staphylococcus aureus: Secondary | ICD-10-CM

## 2011-07-06 DIAGNOSIS — N186 End stage renal disease: Secondary | ICD-10-CM

## 2011-07-06 DIAGNOSIS — R0602 Shortness of breath: Secondary | ICD-10-CM

## 2011-07-06 DIAGNOSIS — I959 Hypotension, unspecified: Secondary | ICD-10-CM

## 2011-07-06 LAB — BASIC METABOLIC PANEL
Calcium: 9.7 mg/dL (ref 8.4–10.5)
GFR calc non Af Amer: 5 mL/min — ABNORMAL LOW (ref >90)
Glucose, Bld: 165 mg/dL — ABNORMAL HIGH (ref 70–99)
Sodium: 129 mEq/L — ABNORMAL LOW (ref 135–145)

## 2011-07-06 LAB — GLUCOSE, CAPILLARY
Glucose-Capillary: 149 mg/dL — ABNORMAL HIGH (ref 70–99)
Glucose-Capillary: 175 mg/dL — ABNORMAL HIGH (ref 70–99)
Glucose-Capillary: 296 mg/dL — ABNORMAL HIGH (ref 70–99)
Glucose-Capillary: 280 mg/dL — ABNORMAL HIGH (ref 70–99)

## 2011-07-06 LAB — VANCOMYCIN, RANDOM: Vancomycin Rm: 17.7 ug/mL

## 2011-07-06 LAB — HEPARIN LEVEL (UNFRACTIONATED): Heparin Unfractionated: 0.2 IU/mL — ABNORMAL LOW (ref 0.30–0.70)

## 2011-07-06 LAB — CBC
HCT: 26.7 % — ABNORMAL LOW (ref 36.0–46.0)
MCHC: 32.2 g/dL (ref 30.0–36.0)
Platelets: 342 10*3/uL (ref 150–400)
RDW: 14.1 % (ref 11.5–15.5)

## 2011-07-06 LAB — CATH TIP CULTURE: Culture: NO GROWTH

## 2011-07-06 MED ORDER — TRAZODONE HCL 50 MG PO TABS
25.0000 mg | ORAL_TABLET | Freq: Every evening | ORAL | Status: DC | PRN
  Administered 2011-07-06 – 2011-07-10 (×2): 25 mg via ORAL
  Filled 2011-07-06 (×2): qty 1

## 2011-07-06 MED ORDER — OXYCODONE HCL 5 MG PO TABS
5.0000 mg | ORAL_TABLET | Freq: Four times a day (QID) | ORAL | Status: DC | PRN
  Administered 2011-07-07 – 2011-07-09 (×5): 5 mg via ORAL
  Filled 2011-07-06 (×5): qty 1

## 2011-07-06 NOTE — Progress Notes (Signed)
Attempted to call [facility] (third attempt) to get outpatient plan of care. Phone goes to voice mail, unable to reach staff (perhaps due to Saturday hours). Will attempt again on Monday.

## 2011-07-06 NOTE — Consult Note (Signed)
ANTIBIOTIC CONSULT NOTE - Follow-up  Pharmacy Consult for Vancomycin Indication: bacteremia  Allergies  Allergen Reactions  . Ancef (Cefazolin Sodium) Shortness Of Breath    wheezing  . Coumadin (Warfarin Sodium)     Eyes bleed  . Morphine And Related Itching  . Sulfa Antibiotics Anaphylaxis and Shortness Of Breath  . Tomato Hives and Swelling   Patient Measurements: Height: 5\' 8"  (172.7 cm) Weight: 145 lb 1 oz (65.8 kg) IBW/kg (Calculated) : 63.9   Vital Signs: Temp: 98 F (36.7 C) (06/01 0400) Temp src: Oral (06/01 0400) BP: 109/67 mmHg (06/01 0600) Pulse Rate: 106  (06/01 0600) Intake/Output from previous day: 05/31 0701 - 06/01 0700 In: 950.5 [P.O.:540; I.V.:110.5; IV Piggyback:300] Out: -  Intake/Output from this shift:    Labs:  Basename 07/06/11 0504 07/05/11 0610 07/04/11 0514  WBC 7.5 5.8 7.1  HGB 8.6* 10.3* 9.6*  PLT 342 274 224  LABCREA -- -- --  CREATININE 9.15* 7.14* 5.25*   Estimated Creatinine Clearance: 9.5 ml/min (by C-G formula based on Cr of 9.15).  Basename 07/06/11 0504  VANCOTROUGH --  Leodis Binet --  VANCORANDOM 17.7  GENTTROUGH --  GENTPEAK --  GENTRANDOM --  TOBRATROUGH --  TOBRAPEAK --  TOBRARND --  AMIKACINPEAK --  AMIKACINTROU --  AMIKACIN --     Microbiology: Recent Results (from the past 720 hour(s))  MRSA PCR SCREENING     Status: Abnormal   Collection Time   06/29/11  1:10 AM      Component Value Range Status Comment   MRSA by PCR POSITIVE (*) NEGATIVE  Final   CULTURE, BLOOD (SINGLE)     Status: Normal   Collection Time   06/30/11 12:30 AM      Component Value Range Status Comment   Specimen Description Blood PORTA CATH   Final    Special Requests     Final    Value: BOTTLES DRAWN AEROBIC AND ANAEROBIC 7CC DRAWN BY RN   Culture  Setup Time [id number]   Final    Culture     Final    Value: METHICILLIN RESISTANT STAPHYLOCOCCUS AUREUS     Note: RIFAMPIN AND GENTAMICIN SHOULD NOT BE USED AS SINGLE DRUGS FOR  TREATMENT OF STAPH INFECTIONS. This organism DOES NOT demonstrate inducible Clindamycin resistance in vitro. CRITICAL RESULT CALLED TO, READ BACK BY AND VERIFIED WITH: [facility] MURRELL      07/01/11 1315 BY SMITHERSJ     Note: Gram Stain Report Called to,Read Back By and Verified With: JARRELL W. AT 12PM ON [id number] BY THOMPSON S. Performed at [facility]   Report Status 07/02/2011 FINAL   Final    Organism ID, Bacteria METHICILLIN RESISTANT STAPHYLOCOCCUS AUREUS   Final   STOOL CULTURE     Status: Normal   Collection Time   06/30/11 10:45 AM      Component Value Range Status Comment   Specimen Description STOOL   Final    Special Requests NONE   Final    Culture     Final    Value: NO SALMONELLA, SHIGELLA, CAMPYLOBACTER, YERSINIA, OR E.COLI 0157:H7 ISOLATED   Report Status 07/04/2011 FINAL   Final   CLOSTRIDIUM DIFFICILE BY PCR     Status: Normal   Collection Time   06/30/11 10:45 AM      Component Value Range Status Comment   C difficile by pcr NEGATIVE  NEGATIVE  Final   MRSA PCR SCREENING     Status: Abnormal   Collection  Time   07/02/11  9:09 AM      Component Value Range Status Comment   MRSA by PCR POSITIVE (*) NEGATIVE  Final   SURGICAL PCR SCREEN     Status: Abnormal   Collection Time   07/03/11  6:26 PM      Component Value Range Status Comment   MRSA, PCR POSITIVE (*) NEGATIVE  Final    Staphylococcus aureus POSITIVE (*) NEGATIVE  Final   CATH TIP CULTURE     Status: Normal   Collection Time   07/04/11  8:45 AM      Component Value Range Status Comment   Specimen Description CATH TIP PORTA CATH   Final    Special Requests VANCOMYCIN   Final    Culture NO GROWTH 2 DAYS   Final    Report Status 07/06/2011 FINAL   Final   CATH TIP CULTURE     Status: Normal (Preliminary result)   Collection Time   07/04/11  8:45 AM      Component Value Range Status Comment   Specimen Description CATH TIP HEMODIALYSIS CATHETER   Final    Special Requests NONE   Final    Culture Culture  reincubated for better growth   Final    Report Status PENDING   Incomplete   CULTURE, BLOOD (ROUTINE X 2)     Status: Normal (Preliminary result)   Collection Time   07/05/11  6:00 PM      Component Value Range Status Comment   Specimen Description BLOOD PORTA CATH   Final    Special Requests BOTTLES DRAWN AEROBIC AND ANAEROBIC 5CC   Final    Culture PENDING   Incomplete    Report Status PENDING   Incomplete   CULTURE, BLOOD (ROUTINE X 2)     Status: Normal (Preliminary result)   Collection Time   07/05/11  6:05 PM      Component Value Range Status Comment   Specimen Description Blood PORTA CATH   Final    Special Requests BOTTLES DRAWN AEROBIC AND ANAEROBIC 10CC   Final    Culture PENDING   Incomplete    Report Status PENDING   Incomplete    Medical History: Past Medical History  Diagnosis Date  . Diabetes mellitus     insulin dependent diabetes mellitus with history of poor control  . Hypertension   . Hyperlipidemia   . Nephrotic syndrome   . Anemia     chronic,  Dr. Mariel Sleet,  Transfused, May, 2012  . 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 Hypertrophic cardiomyopathy     with diastolic heart dysfunction  . 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 Vaginosis   . History of MRSA infection     multiple  . Glaucoma   . Warfarin anticoagulation     DVT  . Ejection fraction     55%, echo 12/2009  . Manic depressive disorder   . Noncompliance   . port-a-cath   . Dialysis patient     M-W-F   Medications:  Scheduled:     . amLODipine  5 mg Oral QHS  . Chlorhexidine Gluconate Cloth  6 each Topical Q0600  . dextrose  25 g Intravenous Once  . epoetin alfa  12,000 Units Intravenous 3 times weekly  . heparin  3,500 Units Intravenous Once  . HYDROmorphone  1 mg Intravenous Once  . insulin aspart  0-9 Units Subcutaneous Q4H  . insulin aspart  10 Units Intravenous Once  . labetalol  300 mg Oral TID  .  levETIRAcetam  500 mg Oral BID  . mupirocin ointment  1 application Nasal BID  . senna-docusate  1 tablet Oral BID  . sodium chloride  10-40 mL Intracatheter Q12H  . sodium chloride  3 mL Intravenous Q12H  . vancomycin  750 mg Intravenous Q M,W,F-HD  . DISCONTD: amLODipine  5 mg Oral QHS  . DISCONTD: cloNIDine  0.3 mg Transdermal Weekly  . DISCONTD: labetalol  300 mg Oral TID   Assessment: 26yo F on day#7 vancomycin for MRSA blood infection. Catheter was removed yesterday.  Catheter cultures were negative.  Plans for HD today.   She is currently afebrile with normal WBC. PreHD level was therapeutic today.  Goal of Therapy:  Eradicate infection. Pre-HD vancomycin level 15-25 mcg/ml  Plan:  1) Vancomycin 750mg  IV after each dialysis 2) Weekly pre-HD level while on vancomycin  [name], [facility] 07/06/2011,7:55 AM

## 2011-07-06 NOTE — Progress Notes (Signed)
System clotted with approx 10 minutes remaining in tx. Dr. [name] notified, tx. ended

## 2011-07-06 NOTE — Progress Notes (Signed)
Rapid Response Event Note:  Rapid Response called for SOB and increased lethargy.       Initial Focused Assessment: Pt sitting up in chair, very lethargic, but arouses to verbal stumili and able to mumble responses to questions.   Interventions: VS check, BP 50/60, HR 104, sats 97% on 50% veni mask by RT. Pt reports feeling SOB, before pt was on room air and sats in 80's. Pt reports no pain. Pt returned to bed with nursing help in supine position. CBG checked, blood sugar 280. MD at bedside.  Multiple attempts made for IV access and unsuccessful. Order received to insert HD femoral cath per Dr. Kerry Hough. ABG order and transfer to ICU for stepdown status.  Event Summary:   Pt transferred to ICU for step-down status. Pt to be started on Heparin drip until PE/DVT can be ruled out. IV access to be started by Dr. Lovell Sheehan. Will continue to monitor.    Luman Holway, Pearla Dubonnet

## 2011-07-06 NOTE — Progress Notes (Signed)
ANTICOAGULATION CONSULT NOTE  Pharmacy Consult for heparin Indication: rule out PE  Allergies  Allergen Reactions  . Ancef (Cefazolin Sodium) Shortness Of Breath    wheezing  . Coumadin (Warfarin Sodium)     Eyes bleed  . Morphine And Related Itching  . Sulfa Antibiotics Anaphylaxis and Shortness Of Breath  . Tomato Hives and Swelling    Patient Measurements: Height: 5\' 8"  (172.7 cm) Weight: 145 lb 1 oz (65.8 kg) IBW/kg (Calculated) : 63.9   Vital Signs: Temp: 98 F (36.7 C) (06/01 0400) Temp src: Oral (06/01 0400) BP: 109/67 mmHg (06/01 0600) Pulse Rate: 106  (06/01 0600)  Labs:  Basename 07/06/11 0504 07/06/11 0159 07/05/11 0610 07/04/11 0514  HGB 8.6* -- 10.3* --  HCT 26.7* -- 32.1* 29.3*  PLT 342 -- 274 224  APTT -- -- -- --  LABPROT -- -- -- --  INR -- -- -- --  HEPARINUNFRC -- 0.20* -- --  CREATININE 9.15* -- 7.14* 5.25*  CKTOTAL -- -- -- --  CKMB -- -- -- --  TROPONINI -- -- -- --    Estimated Creatinine Clearance: 9.5 ml/min (by C-G formula based on Cr of 9.15).   Medical History: Past Medical History  Diagnosis Date  . Diabetes mellitus     insulin dependent diabetes mellitus with history of poor control  . Hypertension   . Hyperlipidemia   . Nephrotic syndrome   . Anemia     chronic,  Dr. Mariel Sleet,  Transfused, May, 2012  . 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 Hypertrophic cardiomyopathy     with diastolic heart dysfunction  . 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 Vaginosis   . History of MRSA infection     multiple  . Glaucoma   . Warfarin anticoagulation     DVT  . Ejection fraction     55%, echo 12/2009  . Manic depressive disorder   . Noncompliance   . port-a-cath   . Dialysis patient     M-W-F    Medications:  Scheduled:     . amLODipine  5 mg Oral QHS  . Chlorhexidine Gluconate Cloth  6 each Topical Q0600  . dextrose  25 g Intravenous Once  . epoetin  alfa  12,000 Units Intravenous 3 times weekly  . heparin  3,500 Units Intravenous Once  . HYDROmorphone  1 mg Intravenous Once  . insulin aspart  0-9 Units Subcutaneous Q4H  . insulin aspart  10 Units Intravenous Once  . labetalol  300 mg Oral TID  . levETIRAcetam  500 mg Oral BID  . mupirocin ointment  1 application Nasal BID  . senna-docusate  1 tablet Oral BID  . sodium chloride  10-40 mL Intracatheter Q12H  . sodium chloride  3 mL Intravenous Q12H  . vancomycin  750 mg Intravenous Q M,W,F-HD  . DISCONTD: amLODipine  5 mg Oral QHS  . DISCONTD: cloNIDine  0.3 mg Transdermal Weekly  . DISCONTD: labetalol  300 mg Oral TID    Assessment: 26 yo F who has been hospitalized for MRSA line infection since 06/28/2011 currently on IV heparin for possible PE.  CT to rule out PE is pending. She has been on SCDs since admission.  She has ESRD requiring dialysis.   No bleeding noted. Initial heparin level is subtherapeutic.     Goal of Therapy:  Heparin level 0.3-0.7 units/ml Monitor platelets by anticoagulation  protocol: Yes   Plan:  1) Increase heparin 1150 units/hr 2) Check 8 hr heparin level after rate increase 3) Continue daily heparin level & CBC while on heparin  Westlee Devita, [facility] 07/06/2011,7:53 AM

## 2011-07-06 NOTE — Progress Notes (Signed)
Subjective: Interval History: has no complaint of nausea or vomiting. She denies also any difficulty breathing. Appetite is good. Last night she had an episode of difficulty breathing and hypotension..  Objective: Vital signs in last 24 hours: Temp:  [97.9 F (36.6 C)-99.2 F (37.3 C)] 98 F (36.7 C) (06/01 0400) Pulse Rate:  [95-123] 106  (06/01 0600) Resp:  [13-23] 17  (06/01 0600) BP: (70-142)/(46-93) 109/67 mmHg (06/01 0600) SpO2:  [94 %-100 %] 99 % (06/01 0600) FiO2 (%):  [50 %] 50 % (05/31 1700) Weight:  [65.8 kg (145 lb 1 oz)] 65.8 kg (145 lb 1 oz) (06/01 0700) Weight change:   Intake/Output from previous day: 05/31 0701 - 06/01 0700 In: 950.5 [P.O.:540; I.V.:110.5; IV Piggyback:300] Out: -  Intake/Output this shift:    General appearance: alert, cooperative and no distress Resp: clear to auscultation bilaterally Cardio: regular rate and rhythm, S1, S2 normal, no murmur, click, rub or gallop GI: soft, non-tender; bowel sounds normal; no masses,  no organomegaly Extremities: extremities normal, atraumatic, no cyanosis or edema  Lab Results:  [facility] 07/06/11 0504 07/05/11 0610  WBC 7.5 5.8  HGB 8.6* 10.3*  HCT 26.7* 32.1*  PLT 342 274   BMET:  Basename 07/06/11 0504 07/05/11 0610  NA 129* 133*  K 5.0 4.5  CL 95* 93*  CO2 24 26  GLUCOSE 165* 215*  BUN 49* 35*  CREATININE 9.15* 7.14*  CALCIUM 9.7 10.0    Basename 07/04/11 0514  PTH 256.0*   Iron Studies: No results found for this basename: IRON,TIBC,TRANSFERRIN,FERRITIN in the last 72 hours  Studies/Results: Dg Chest Port 1 View  07/05/2011  *RADIOLOGY REPORT*  Clinical Data: Shortness of breath  PORTABLE CHEST - 1 VIEW  Comparison: Portable chest x-ray of 06/28/2011  Findings: There is mild linear atelectasis of the lung bases.  The heart is borderline enlarged. The previous central venous line is no longer seen and the pacemaker has been removed.  No bony abnormality is seen.  IMPRESSION: Mild basilar  linear atelectasis.  Original Report Authenticated By: Juline Patch, M.D.    I have reviewed the patient'[name] current medications.  Assessment/Plan: Problem #1 end-stage renal disease she status post her hemodialysis on Wednesday her BUN and creatinine is 49 and 9.15 with potassium of 5. Presently she doesn't have any uremic sinus symptoms. Problem #2 anemia her hemoglobin has gone down Problem #3 history of MRSA sepsis she is on vancomycin he febrile her white blood cell count is 7.5 her lites are taken out. Problem #4 history of hypertension patient had episode of hypotension. Problem #5 history of hyperphosphatemia she is on a binder Problem #6 history of diabetes Problem #7 history of secondary hyperparathyroidism PTH is was in acceptable range. Plan: We'll do hemodialysis today using a femoral access once it is placed by surgery. We'll make arrangements for patient to get permacath possibly on Monday or Tuesday. We'll follow her basic metabolic panel and CBC in the morning.    LOS: 8 days   [name] 07/06/2011,8:17 AM

## 2011-07-06 NOTE — Progress Notes (Signed)
Subjective: Patient transferred to the stepdown unit last night when she became hypotensive and short of breath.  She reports that her breathing is doing ok now.  She is not on any oxygen.  She is sleeping on my arrival and is slow to wake up.  She reports having an upset stomach and has noted some loose stools.  Denies any chest pain.   Objective: Vital signs in last 24 hours: Temp:  [97.9 F (36.6 C)-99.2 F (37.3 C)] 98 F (36.7 C) (06/01 0400) Pulse Rate:  [95-123] 106  (06/01 0600) Resp:  [13-23] 17  (06/01 0600) BP: (70-142)/(46-93) 109/67 mmHg (06/01 0600) SpO2:  [94 %-100 %] 99 % (06/01 0600) FiO2 (%):  [50 %] 50 % (05/31 1700) Weight:  [65.8 kg (145 lb 1 oz)] 65.8 kg (145 lb 1 oz) (06/01 0700) Weight change:  Last BM Date: 07/01/11  Intake/Output from previous day: 05/31 0701 - 06/01 0700 In: 950.5 [P.O.:540; I.V.:110.5; IV Piggyback:300] Out: -      Physical Exam: General: Alert, awake, oriented x3, in no acute distress. HEENT: No bruits, no goiter. Heart: Regular rate and rhythm, without murmurs, rubs, gallops. Lungs: Clear to auscultation bilaterally. Abdomen: Soft, nontender, nondistended, positive bowel sounds. Extremities: No clubbing cyanosis or edema with positive pedal pulses. Neuro: Grossly intact, nonfocal.    Lab Results: Basic Metabolic Panel:  Basename 07/06/11 0504 07/05/11 0610 07/04/11 0514  NA 129* 133* --  K 5.0 4.5 --  CL 95* 93* --  CO2 24 26 --  GLUCOSE 165* 215* --  BUN 49* 35* --  CREATININE 9.15* 7.14* --  CALCIUM 9.7 10.0 --  MG -- -- --  PHOS -- -- 5.2*   Liver Function Tests:  Basename 07/04/11 0514  AST 27  ALT 20  ALKPHOS 136*  BILITOT 0.2*  PROT 7.3  ALBUMIN 2.6*   No results found for this basename: LIPASE:2,AMYLASE:2 in the last 72 hours No results found for this basename: AMMONIA:2 in the last 72 hours CBC:  Basename 07/06/11 0504 07/05/11 0610  WBC 7.5 5.8  NEUTROABS -- --  HGB 8.6* 10.3*  HCT 26.7* 32.1*   MCV 96.4 96.7  PLT 342 274   Cardiac Enzymes: No results found for this basename: CKTOTAL:3,CKMB:3,CKMBINDEX:3,TROPONINI:3 in the last 72 hours BNP: No results found for this basename: PROBNP:3 in the last 72 hours D-Dimer: No results found for this basename: DDIMER:2 in the last 72 hours CBG:  Basename 07/06/11 0358 07/05/11 2335 07/05/11 1939 07/05/11 1618 07/05/11 1122 07/05/11 0731  GLUCAP 149* 90 265* 280* 193* 197*   Hemoglobin A1C: No results found for this basename: HGBA1C in the last 72 hours Fasting Lipid Panel: No results found for this basename: CHOL,HDL,LDLCALC,TRIG,CHOLHDL,LDLDIRECT in the last 72 hours Thyroid Function Tests: No results found for this basename: TSH,T4TOTAL,FREET4,T3FREE,THYROIDAB in the last 72 hours Anemia Panel: No results found for this basename: VITAMINB12,FOLATE,FERRITIN,TIBC,IRON,RETICCTPCT in the last 72 hours Coagulation: No results found for this basename: LABPROT:2,INR:2 in the last 72 hours Urine Drug Screen: Drugs of Abuse     Component Value Date/Time   LABOPIA POSITIVE* 05/21/2009 2300   COCAINSCRNUR NONE DETECTED 05/21/2009 2300   LABBENZ NONE DETECTED 05/21/2009 2300   AMPHETMU NONE DETECTED 05/21/2009 2300   THCU NONE DETECTED 05/21/2009 2300   LABBARB  Value: NONE DETECTED        DRUG SCREEN FOR MEDICAL PURPOSES ONLY.  IF CONFIRMATION IS NEEDED FOR ANY PURPOSE, NOTIFY LAB WITHIN 5 DAYS.  LOWEST DETECTABLE LIMITS FOR URINE DRUG SCREEN Drug Class       Cutoff (ng/mL) Amphetamine      1000 Barbiturate      200 Benzodiazepine   200 Tricyclics       300 Opiates          300 Cocaine          300 THC              50 05/21/2009 2300    Alcohol Level: No results found for this basename: ETH:2 in the last 72 hours Urinalysis: No results found for this basename: COLORURINE:2,APPERANCEUR:2,LABSPEC:2,PHURINE:2,GLUCOSEU:2,HGBUR:2,BILIRUBINUR:2,KETONESUR:2,PROTEINUR:2,UROBILINOGEN:2,NITRITE:2,LEUKOCYTESUR:2 in the last 72 hours  Recent  Results (from the past 240 hour(s))  MRSA PCR SCREENING     Status: Abnormal   Collection Time   06/29/11  1:10 AM      Component Value Range Status Comment   MRSA by PCR POSITIVE (*) NEGATIVE  Final   CULTURE, BLOOD (SINGLE)     Status: Normal   Collection Time   06/30/11 12:30 AM      Component Value Range Status Comment   Specimen Description Blood PORTA CATH   Final    Special Requests     Final    Value: BOTTLES DRAWN AEROBIC AND ANAEROBIC 7CC DRAWN BY RN   Culture  Setup Time [id number]   Final    Culture     Final    Value: METHICILLIN RESISTANT STAPHYLOCOCCUS AUREUS     Note: RIFAMPIN AND GENTAMICIN SHOULD NOT BE USED AS SINGLE DRUGS FOR TREATMENT OF STAPH INFECTIONS. This organism DOES NOT demonstrate inducible Clindamycin resistance in vitro. CRITICAL RESULT CALLED TO, READ BACK BY AND VERIFIED WITH: [facility] MURRELL      07/01/11 1315 BY SMITHERSJ     Note: Gram Stain Report Called to,Read Back By and Verified With: JARRELL W. AT 12PM ON [id number] BY THOMPSON S. Performed at [facility]   Report Status 07/02/2011 FINAL   Final    Organism ID, Bacteria METHICILLIN RESISTANT STAPHYLOCOCCUS AUREUS   Final   STOOL CULTURE     Status: Normal   Collection Time   06/30/11 10:45 AM      Component Value Range Status Comment   Specimen Description STOOL   Final    Special Requests NONE   Final    Culture     Final    Value: NO SALMONELLA, SHIGELLA, CAMPYLOBACTER, YERSINIA, OR E.COLI 0157:H7 ISOLATED   Report Status 07/04/2011 FINAL   Final   CLOSTRIDIUM DIFFICILE BY PCR     Status: Normal   Collection Time   06/30/11 10:45 AM      Component Value Range Status Comment   C difficile by pcr NEGATIVE  NEGATIVE  Final   MRSA PCR SCREENING     Status: Abnormal   Collection Time   07/02/11  9:09 AM      Component Value Range Status Comment   MRSA by PCR POSITIVE (*) NEGATIVE  Final   SURGICAL PCR SCREEN     Status: Abnormal   Collection Time   07/03/11  6:26 PM      Component Value  Range Status Comment   MRSA, PCR POSITIVE (*) NEGATIVE  Final    Staphylococcus aureus POSITIVE (*) NEGATIVE  Final   CATH TIP CULTURE     Status: Normal   Collection Time   07/04/11  8:45 AM      Component Value Range Status Comment   Specimen Description CATH  TIP PORTA CATH   Final    Special Requests VANCOMYCIN   Final    Culture NO GROWTH 2 DAYS   Final    Report Status 07/06/2011 FINAL   Final   CATH TIP CULTURE     Status: Normal (Preliminary result)   Collection Time   07/04/11  8:45 AM      Component Value Range Status Comment   Specimen Description CATH TIP HEMODIALYSIS CATHETER   Final    Special Requests NONE   Final    Culture Culture reincubated for better growth   Final    Report Status PENDING   Incomplete   CULTURE, BLOOD (ROUTINE X 2)     Status: Normal (Preliminary result)   Collection Time   07/05/11  6:00 PM      Component Value Range Status Comment   Specimen Description BLOOD PORTA CATH   Final    Special Requests BOTTLES DRAWN AEROBIC AND ANAEROBIC 5CC   Final    Culture PENDING   Incomplete    Report Status PENDING   Incomplete   CULTURE, BLOOD (ROUTINE X 2)     Status: Normal (Preliminary result)   Collection Time   07/05/11  6:05 PM      Component Value Range Status Comment   Specimen Description Blood PORTA CATH   Final    Special Requests BOTTLES DRAWN AEROBIC AND ANAEROBIC 10CC   Final    Culture PENDING   Incomplete    Report Status PENDING   Incomplete     Studies/Results: Dg Chest Port 1 View  07/05/2011  *RADIOLOGY REPORT*  Clinical Data: Shortness of breath  PORTABLE CHEST - 1 VIEW  Comparison: Portable chest x-ray of 06/28/2011  Findings: There is mild linear atelectasis of the lung bases.  The heart is borderline enlarged. The previous central venous line is no longer seen and the pacemaker has been removed.  No bony abnormality is seen.  IMPRESSION: Mild basilar linear atelectasis.  Original Report Authenticated By: Juline Patch, M.D.     Medications: Scheduled Meds:   . amLODipine  5 mg Oral QHS  . Chlorhexidine Gluconate Cloth  6 each Topical Q0600  . dextrose  25 g Intravenous Once  . epoetin alfa  12,000 Units Intravenous 3 times weekly  . heparin  3,500 Units Intravenous Once  . HYDROmorphone  1 mg Intravenous Once  . insulin aspart  0-9 Units Subcutaneous Q4H  . insulin aspart  10 Units Intravenous Once  . labetalol  300 mg Oral TID  . levETIRAcetam  500 mg Oral BID  . mupirocin ointment  1 application Nasal BID  . senna-docusate  1 tablet Oral BID  . sodium chloride  10-40 mL Intracatheter Q12H  . sodium chloride  3 mL Intravenous Q12H  . vancomycin  750 mg Intravenous Q M,W,F-HD  . DISCONTD: amLODipine  5 mg Oral QHS  . DISCONTD: cloNIDine  0.3 mg Transdermal Weekly  . DISCONTD: labetalol  300 mg Oral TID   Continuous Infusions:   . DISCONTD: heparin 1,000 Units/hr (07/06/11 0600)   PRN Meds:.bisacodyl, heparin, heparin, ondansetron (ZOFRAN) IV, ondansetron, oxyCODONE, sodium chloride, sodium chloride, technetium albumin aggregated, DISCONTD: lidocaine  Assessment/Plan:   1. Hypotension, unclear etiology, appears to have resolved, blood pressure better now, lactic acid normal, repeat blood cultures sent.  2. Acute respiratory Failure, also appears to have improved.  V/Q scan negative for PE, so will discontinue heparin. Chest xray is also unremarkable.  Patient was on  50% VM yesterday but appears to be comfortable on room air this morning. She is due for dialysis today.  3. Lethargy, possibly related to hypotension, blood sugar checked and was not low, Will continue to monitor closely   4. MRSA bacteremia, on vancomycin. Her dialysis catheter and portacath (placed for blood draws) were removed yesterday. She needs new dialysis access placed and repeat blood cultures to see if they have cleared. If they have cleared, patient will be treated for a total of 2 weeks.   5. ESRD on HD. Dr. [name] is  following   6. DM 1, blood sugars stable   7. IV Access. Patient now has a temporary femoral dialysis catheter.    LOS: 8 days   Monie Shere [facility] Pager: [phone] 07/06/2011, 7:57 AM

## 2011-07-07 DIAGNOSIS — N186 End stage renal disease: Secondary | ICD-10-CM

## 2011-07-07 DIAGNOSIS — R0602 Shortness of breath: Secondary | ICD-10-CM

## 2011-07-07 DIAGNOSIS — I959 Hypotension, unspecified: Secondary | ICD-10-CM

## 2011-07-07 DIAGNOSIS — A4102 Sepsis due to Methicillin resistant Staphylococcus aureus: Secondary | ICD-10-CM

## 2011-07-07 LAB — CBC
MCHC: 32.2 g/dL (ref 30.0–36.0)
RDW: 14.2 % (ref 11.5–15.5)
WBC: 6.5 10*3/uL (ref 4.0–10.5)

## 2011-07-07 LAB — CATH TIP CULTURE: Culture: 15

## 2011-07-07 LAB — BASIC METABOLIC PANEL
BUN: 31 mg/dL — ABNORMAL HIGH (ref 6–23)
Creatinine, Ser: 6.12 mg/dL — ABNORMAL HIGH (ref 0.50–1.10)
GFR calc Af Amer: 10 mL/min — ABNORMAL LOW (ref >90)
GFR calc non Af Amer: 9 mL/min — ABNORMAL LOW (ref >90)
Potassium: 4.2 mEq/L (ref 3.5–5.1)

## 2011-07-07 LAB — GLUCOSE, CAPILLARY: Glucose-Capillary: 450 mg/dL — ABNORMAL HIGH (ref 70–99)

## 2011-07-07 MED ORDER — SODIUM CHLORIDE 0.9 % IJ SOLN
INTRAMUSCULAR | Status: AC
  Filled 2011-07-07: qty 3

## 2011-07-07 MED ORDER — HYDROMORPHONE HCL PF 1 MG/ML IJ SOLN
0.5000 mg | Freq: Once | INTRAMUSCULAR | Status: AC
  Administered 2011-07-07: 0.5 mg via INTRAVENOUS
  Filled 2011-07-07: qty 1

## 2011-07-07 MED ORDER — INSULIN ASPART 100 UNIT/ML ~~LOC~~ SOLN
15.0000 [IU] | Freq: Once | SUBCUTANEOUS | Status: AC
  Administered 2011-07-07: 15 [IU] via SUBCUTANEOUS

## 2011-07-07 MED ORDER — INSULIN ASPART 100 UNIT/ML ~~LOC~~ SOLN: 0.0000 [IU] | Freq: Every day | SUBCUTANEOUS | Status: DC

## 2011-07-07 MED ORDER — INSULIN ASPART 100 UNIT/ML ~~LOC~~ SOLN
10.0000 [IU] | Freq: Once | SUBCUTANEOUS | Status: AC
  Administered 2011-07-07: 10 [IU] via SUBCUTANEOUS

## 2011-07-07 MED ORDER — INSULIN ASPART 100 UNIT/ML ~~LOC~~ SOLN
0.0000 [IU] | Freq: Three times a day (TID) | SUBCUTANEOUS | Status: DC
  Administered 2011-07-08: 1 [IU] via SUBCUTANEOUS
  Administered 2011-07-08: 2 [IU] via SUBCUTANEOUS
  Administered 2011-07-09: 6 [IU] via SUBCUTANEOUS
  Administered 2011-07-09 – 2011-07-10 (×3): 2 [IU] via SUBCUTANEOUS

## 2011-07-07 NOTE — Progress Notes (Signed)
Subjective: Interval History: has no complaint of nausea or vomiting. She denies any difficulty breathing..  Objective: Vital signs in last 24 hours: Temp:  [98.3 F (36.8 C)-98.6 F (37 C)] 98.5 F (36.9 C) (06/02 0400) Pulse Rate:  [100-115] 101  (06/02 0600) Resp:  [12-25] 17  (06/02 0600) BP: (87-135)/(53-90) 117/77 mmHg (06/02 0600) SpO2:  [98 %-100 %] 100 % (06/02 0600) Weight:  [64.8 kg (142 lb 13.7 oz)-66.1 kg (145 lb 11.6 oz)] 65.8 kg (145 lb 1 oz) (06/02 0500) Weight change: 0.3 kg (10.6 oz)  Intake/Output from previous day: 06/01 0701 - 06/02 0700 In: 890 [P.O.:870; I.V.:20] Out: 2210  Intake/Output this shift:    General appearance: alert, cooperative and no distress Resp: clear to auscultation bilaterally Cardio: regular rate and rhythm, S1, S2 normal, no murmur, click, rub or gallop GI: soft, non-tender; bowel sounds normal; no masses,  no organomegaly Extremities: extremities normal, atraumatic, no cyanosis or edema  Lab Results:  Basename 07/07/11 0443 07/06/11 0504  WBC 6.5 7.5  HGB 7.8* 8.6*  HCT 24.2* 26.7*  PLT 306 342   BMET:  Basename 07/07/11 0443 07/06/11 0504  NA 132* 129*  K 4.2 5.0  CL 96 95*  CO2 25 24  GLUCOSE 139* 165*  BUN 31* 49*  CREATININE 6.12* 9.15*  CALCIUM 8.6 9.7   No results found for this basename: PTH:2 in the last 72 hours Iron Studies: No results found for this basename: IRON,TIBC,TRANSFERRIN,FERRITIN in the last 72 hours  Studies/Results: Dg Chest Port 1 View  07/05/2011  *RADIOLOGY REPORT*  Clinical Data: Shortness of breath  PORTABLE CHEST - 1 VIEW  Comparison: Portable chest x-ray of 06/28/2011  Findings: There is mild linear atelectasis of the lung bases.  The heart is borderline enlarged. The previous central venous line is no longer seen and the pacemaker has been removed.  No bony abnormality is seen.  IMPRESSION: Mild basilar linear atelectasis.  Original Report Authenticated By: Juline Patch, M.D.    I have  reviewed the patient's current medications.  Assessment/Plan: Problem #1 end-stage renal disease she status post hemodialysis yesterday her BUN is 31 creatinine 6.12 with potassium of 4.2 which is good. Problem #2 MRSA sepsis she is on vancomycin presently her catheter taken out patient is a febrile with normal white blood cell count. Problem #3 anemia her hemoglobin and hematocrit seems to be declining patient is on Epogen. Problem #4 history of diabetes Problem #5 history of nausea vomiting secondary to diabetic gastropathy presently she is a symptomatic. Problem #6 history of hypertension blood pressure seems to be reasonably controlled her medications has been slowly decreased because of episodes of hypotension. Problem #7 history of  chronic pain syndrome.  Plan: We'll check her CBC and basic metabolic panel in the morning We'll make arrangements for patient to get permanent catheter for hemodialysis if possible tomorrow or the day after After that will take is a femoral catheter and will continue his antibiotics.   LOS: 9 days   [name] S 07/07/2011,7:41 AM

## 2011-07-07 NOTE — Progress Notes (Signed)
Subjective: Patient denies any shortness of breath, nausea and vomiting has improved, she did not sleep well last night, no other new complaints.  Objective: Vital signs in last 24 hours: Temp:  [98.3 F (36.8 C)-98.6 F (37 C)] 98.5 F (36.9 C) (06/02 0400) Pulse Rate:  [100-115] 101  (06/02 0600) Resp:  [12-25] 17  (06/02 0600) BP: (87-135)/(53-90) 117/77 mmHg (06/02 0600) SpO2:  [98 %-100 %] 100 % (06/02 0600) Weight:  [64.8 kg (142 lb 13.7 oz)-66.1 kg (145 lb 11.6 oz)] 65.8 kg (145 lb 1 oz) (06/02 0500) Weight change: 0.3 kg (10.6 oz) Last BM Date: 07/06/11  Intake/Output from previous day: 06/01 0701 - 06/02 0700 In: 890 [P.O.:870; I.V.:20] Out: 2210      Physical Exam: General: Alert, awake, oriented x3, in no acute distress. HEENT: No bruits, no goiter. Heart: Regular rate and rhythm, without murmurs, rubs, gallops. Lungs: Clear to auscultation bilaterally. Abdomen: Soft, nontender, nondistended, positive bowel sounds. Extremities: No clubbing cyanosis or edema with positive pedal pulses. Neuro: Grossly intact, nonfocal.    Lab Results: Basic Metabolic Panel:  Basename 07/07/11 0443 07/06/11 0504  NA 132* 129*  K 4.2 5.0  CL 96 95*  CO2 25 24  GLUCOSE 139* 165*  BUN 31* 49*  CREATININE 6.12* 9.15*  CALCIUM 8.6 9.7  MG -- --  PHOS -- --   Liver Function Tests: No results found for this basename: AST:2,ALT:2,ALKPHOS:2,BILITOT:2,PROT:2,ALBUMIN:2 in the last 72 hours No results found for this basename: LIPASE:2,AMYLASE:2 in the last 72 hours No results found for this basename: AMMONIA:2 in the last 72 hours CBC:  Basename 07/07/11 0443 07/06/11 0504  WBC 6.5 7.5  NEUTROABS -- --  HGB 7.8* 8.6*  HCT 24.2* 26.7*  MCV 96.0 96.4  PLT 306 342   Cardiac Enzymes: No results found for this basename: CKTOTAL:3,CKMB:3,CKMBINDEX:3,TROPONINI:3 in the last 72 hours BNP: No results found for this basename: PROBNP:3 in the last 72 hours D-Dimer: No results  found for this basename: DDIMER:2 in the last 72 hours CBG:  Basename 07/07/11 0402 07/06/11 2359 07/06/11 1947 07/06/11 1624 07/06/11 1151 07/06/11 0736  GLUCAP 128* 211* 280* 296* 175* 121*   Hemoglobin A1C: No results found for this basename: HGBA1C in the last 72 hours Fasting Lipid Panel: No results found for this basename: CHOL,HDL,LDLCALC,TRIG,CHOLHDL,LDLDIRECT in the last 72 hours Thyroid Function Tests: No results found for this basename: TSH,T4TOTAL,FREET4,T3FREE,THYROIDAB in the last 72 hours Anemia Panel: No results found for this basename: VITAMINB12,FOLATE,FERRITIN,TIBC,IRON,RETICCTPCT in the last 72 hours Coagulation: No results found for this basename: LABPROT:2,INR:2 in the last 72 hours Urine Drug Screen: Drugs of Abuse     Component Value Date/Time   LABOPIA POSITIVE* 05/21/2009 2300   COCAINSCRNUR NONE DETECTED 05/21/2009 2300   LABBENZ NONE DETECTED 05/21/2009 2300   AMPHETMU NONE DETECTED 05/21/2009 2300   THCU NONE DETECTED 05/21/2009 2300   LABBARB  Value: NONE DETECTED        DRUG SCREEN FOR MEDICAL PURPOSES ONLY.  IF CONFIRMATION IS NEEDED FOR ANY PURPOSE, NOTIFY LAB WITHIN 5 DAYS.        LOWEST DETECTABLE LIMITS FOR URINE DRUG SCREEN Drug Class       Cutoff (ng/mL) Amphetamine      1000 Barbiturate      200 Benzodiazepine   200 Tricyclics       300 Opiates          300 Cocaine          300 THC  50 05/21/2009 2300    Alcohol Level: No results found for this basename: ETH:2 in the last 72 hours Urinalysis: No results found for this basename: COLORURINE:2,APPERANCEUR:2,LABSPEC:2,PHURINE:2,GLUCOSEU:2,HGBUR:2,BILIRUBINUR:2,KETONESUR:2,PROTEINUR:2,UROBILINOGEN:2,NITRITE:2,LEUKOCYTESUR:2 in the last 72 hours  Recent Results (from the past 240 hour(s))  MRSA PCR SCREENING     Status: Abnormal   Collection Time   06/29/11  1:10 AM      Component Value Range Status Comment   MRSA by PCR POSITIVE (*) NEGATIVE  Final   CULTURE, BLOOD (SINGLE)     Status:  Normal   Collection Time   06/30/11 12:30 AM      Component Value Range Status Comment   Specimen Description Blood PORTA CATH   Final    Special Requests     Final    Value: BOTTLES DRAWN AEROBIC AND ANAEROBIC 7CC DRAWN BY RN   Culture  Setup Time [id number]   Final    Culture     Final    Value: METHICILLIN RESISTANT STAPHYLOCOCCUS AUREUS     Note: RIFAMPIN AND GENTAMICIN SHOULD NOT BE USED AS SINGLE DRUGS FOR TREATMENT OF STAPH INFECTIONS. This organism DOES NOT demonstrate inducible Clindamycin resistance in vitro. CRITICAL RESULT CALLED TO, READ BACK BY AND VERIFIED WITH: [facility] MURRELL      07/01/11 1315 BY SMITHERSJ     Note: Gram Stain Report Called to,Read Back By and Verified With: JARRELL W. AT 12PM ON [id number] BY THOMPSON S. Performed at [facility]   Report Status 07/02/2011 FINAL   Final    Organism ID, Bacteria METHICILLIN RESISTANT STAPHYLOCOCCUS AUREUS   Final   STOOL CULTURE     Status: Normal   Collection Time   06/30/11 10:45 AM      Component Value Range Status Comment   Specimen Description STOOL   Final    Special Requests NONE   Final    Culture     Final    Value: NO SALMONELLA, SHIGELLA, CAMPYLOBACTER, YERSINIA, OR E.COLI 0157:H7 ISOLATED   Report Status 07/04/2011 FINAL   Final   CLOSTRIDIUM DIFFICILE BY PCR     Status: Normal   Collection Time   06/30/11 10:45 AM      Component Value Range Status Comment   C difficile by pcr NEGATIVE  NEGATIVE  Final   MRSA PCR SCREENING     Status: Abnormal   Collection Time   07/02/11  9:09 AM      Component Value Range Status Comment   MRSA by PCR POSITIVE (*) NEGATIVE  Final   SURGICAL PCR SCREEN     Status: Abnormal   Collection Time   07/03/11  6:26 PM      Component Value Range Status Comment   MRSA, PCR POSITIVE (*) NEGATIVE  Final    Staphylococcus aureus POSITIVE (*) NEGATIVE  Final   CATH TIP CULTURE     Status: Normal   Collection Time   07/04/11  8:45 AM      Component Value Range Status Comment    Specimen Description CATH TIP PORTA CATH   Final    Special Requests VANCOMYCIN   Final    Culture NO GROWTH 2 DAYS   Final    Report Status 07/06/2011 FINAL   Final   CATH TIP CULTURE     Status: Normal (Preliminary result)   Collection Time   07/04/11  8:45 AM      Component Value Range Status Comment   Specimen Description CATH TIP HEMODIALYSIS CATHETER  Final    Special Requests NONE   Final    Culture     Final    Value: 15 COLONIES STAPHYLOCOCCUS AUREUS     Note: RIFAMPIN AND GENTAMICIN SHOULD NOT BE USED AS SINGLE DRUGS FOR TREATMENT OF STAPH INFECTIONS.   Report Status PENDING   Incomplete   CULTURE, BLOOD (ROUTINE X 2)     Status: Normal (Preliminary result)   Collection Time   07/05/11  6:00 PM      Component Value Range Status Comment   Specimen Description BLOOD PORTA CATH   Final    Special Requests BOTTLES DRAWN AEROBIC AND ANAEROBIC 5CC   Final    Culture NO GROWTH 2 DAYS   Final    Report Status PENDING   Incomplete   CULTURE, BLOOD (ROUTINE X 2)     Status: Normal (Preliminary result)   Collection Time   07/05/11  6:05 PM      Component Value Range Status Comment   Specimen Description Blood PORTA CATH   Final    Special Requests BOTTLES DRAWN AEROBIC AND ANAEROBIC 10CC   Final    Culture NO GROWTH 2 DAYS   Final    Report Status PENDING   Incomplete     Studies/Results: Dg Chest Port 1 View  07/05/2011  *RADIOLOGY REPORT*  Clinical Data: Shortness of breath  PORTABLE CHEST - 1 VIEW  Comparison: Portable chest x-ray of 06/28/2011  Findings: There is mild linear atelectasis of the lung bases.  The heart is borderline enlarged. The previous central venous line is no longer seen and the pacemaker has been removed.  No bony abnormality is seen.  IMPRESSION: Mild basilar linear atelectasis.  Original Report Authenticated By: Juline Patch, M.D.    Medications: Scheduled Meds:   . Chlorhexidine Gluconate Cloth  6 each Topical Q0600  . dextrose  25 g Intravenous Once    . epoetin alfa  12,000 Units Intravenous 3 times weekly  . HYDROmorphone  1 mg Intravenous Once  . insulin aspart  0-9 Units Subcutaneous Q4H  . insulin aspart  10 Units Intravenous Once  . labetalol  300 mg Oral TID  . levETIRAcetam  500 mg Oral BID  . mupirocin ointment  1 application Nasal BID  . senna-docusate  1 tablet Oral BID  . sodium chloride  10-40 mL Intracatheter Q12H  . sodium chloride  3 mL Intravenous Q12H  . vancomycin  750 mg Intravenous Q M,W,F-HD  . DISCONTD: amLODipine  5 mg Oral QHS   Continuous Infusions:  PRN Meds:.bisacodyl, heparin, heparin, ondansetron (ZOFRAN) IV, ondansetron, oxyCODONE, sodium chloride, sodium chloride, traZODone  Assessment/Plan:  Principal Problem:  *Hyperkalemia Active Problems:  End stage renal disease  Nausea & vomiting  Abdominal pain  Colitis, acute  Plan:  1. Hypotension, appears to have improved, may have been related to meds.  2. Acute respiratory failure, unclear etiology, now resolved and patient breathing comfortably on room air  3. MRSA bacteremia on vancomycin.  Repeat blood cultures have shown no growth.  Patient had portacath and dialysis catheter removed.  She now has a temporary femoral dialysis catheter. Plans are to have permanent catheter placed tomorrow.  4. ESRD on HD  5. DM1, cbgs stable  6. Anemia of renal disease, continue to follow  6. Dispo.  Transfer telemetry   LOS: 9 days   Livy Ross [facility] Pager: [phone] 07/07/2011, 8:20 AM

## 2011-07-07 NOTE — Progress Notes (Signed)
Pt to be transferred to room 332. Report called to RN. Pt to be taken up in chair with personal belongings.

## 2011-07-08 ENCOUNTER — Inpatient Hospital Stay (HOSPITAL_COMMUNITY): Payer: Medicaid Other

## 2011-07-08 DIAGNOSIS — I959 Hypotension, unspecified: Secondary | ICD-10-CM

## 2011-07-08 DIAGNOSIS — A4102 Sepsis due to Methicillin resistant Staphylococcus aureus: Secondary | ICD-10-CM

## 2011-07-08 DIAGNOSIS — N186 End stage renal disease: Secondary | ICD-10-CM

## 2011-07-08 DIAGNOSIS — R0602 Shortness of breath: Secondary | ICD-10-CM

## 2011-07-08 LAB — BASIC METABOLIC PANEL
BUN: 42 mg/dL — ABNORMAL HIGH (ref 6–23)
CO2: 24 mEq/L (ref 19–32)
Calcium: 9.7 mg/dL (ref 8.4–10.5)
Chloride: 94 mEq/L — ABNORMAL LOW (ref 96–112)
Creatinine, Ser: 8.01 mg/dL — ABNORMAL HIGH (ref 0.50–1.10)
Glucose, Bld: 206 mg/dL — ABNORMAL HIGH (ref 70–99)

## 2011-07-08 LAB — CBC
HCT: 25 % — ABNORMAL LOW (ref 36.0–46.0)
MCH: 31.6 pg (ref 26.0–34.0)
MCHC: 33.2 g/dL (ref 30.0–36.0)
MCV: 95.1 fL (ref 78.0–100.0)
Platelets: 384 10*3/uL (ref 150–400)
RDW: 14.2 % (ref 11.5–15.5)
WBC: 6.9 10*3/uL (ref 4.0–10.5)

## 2011-07-08 LAB — GLUCOSE, CAPILLARY: Glucose-Capillary: 135 mg/dL — ABNORMAL HIGH (ref 70–99)

## 2011-07-08 MED ORDER — HEPARIN SODIUM (PORCINE) 1000 UNIT/ML DIALYSIS
100.0000 [IU]/kg | INTRAMUSCULAR | Status: DC | PRN
  Administered 2011-07-08 – 2011-07-10 (×2): 6500 [IU] via INTRAVENOUS_CENTRAL
  Filled 2011-07-08: qty 7

## 2011-07-08 MED ORDER — METOCLOPRAMIDE HCL 5 MG/ML IJ SOLN
10.0000 mg | Freq: Three times a day (TID) | INTRAMUSCULAR | Status: DC
  Administered 2011-07-08 – 2011-07-10 (×5): 10 mg via INTRAVENOUS
  Filled 2011-07-08 (×5): qty 2

## 2011-07-08 MED ORDER — HEPARIN SODIUM (PORCINE) 1000 UNIT/ML DIALYSIS
1000.0000 [IU] | INTRAMUSCULAR | Status: DC | PRN
  Administered 2011-07-08: 1000 [IU] via INTRAVENOUS_CENTRAL
  Filled 2011-07-08: qty 1

## 2011-07-08 MED ORDER — HYDROMORPHONE HCL PF 1 MG/ML IJ SOLN
0.5000 mg | Freq: Once | INTRAMUSCULAR | Status: AC
  Administered 2011-07-08: 0.5 mg via INTRAVENOUS
  Filled 2011-07-08: qty 1

## 2011-07-08 MED ORDER — SODIUM CHLORIDE 0.9 % IV SOLN
250.0000 mL | INTRAVENOUS | Status: DC | PRN
  Administered 2011-07-08: 250 mL via INTRAVENOUS

## 2011-07-08 NOTE — Progress Notes (Signed)
Subjective: Patient reports having diffuse abd pain today.  Had two loose stools last night. Having nausea but no vomiting.  Objective: Vital signs in last 24 hours: Temp:  [97 F (36.1 C)-98.5 F (36.9 C)] 98.4 F (36.9 C) (06/03 1530) Pulse Rate:  [102-117] 113  (06/03 1530) Resp:  [16-18] 18  (06/03 1530) BP: (92-142)/(61-97) 95/61 mmHg (06/03 1530) SpO2:  [98 %-99 %] 99 % (06/03 1530) Weight:  [62.7 kg (138 lb 3.7 oz)-65.6 kg (144 lb 10 oz)] 62.7 kg (138 lb 3.7 oz) (06/03 1530) Weight change: -0.918 kg (-2 lb 0.4 oz) Last BM Date: 07/08/11  Intake/Output from previous day: 06/02 0701 - 06/03 0700 In: 904 [P.O.:890; IV Piggyback:14] Out: -  Total I/O In: 240 [P.O.:240] Out: 2950 [Other:2950]   Physical Exam: General: Alert, awake, oriented x3, in no acute distress. HEENT: No bruits, no goiter. Heart: Regular rate and rhythm, without murmurs, rubs, gallops. Lungs: Clear to auscultation bilaterally. Abdomen: Soft, nontender, nondistended, positive bowel sounds. Extremities: No clubbing cyanosis or edema with positive pedal pulses. Neuro: Grossly intact, nonfocal.    Lab Results: Basic Metabolic Panel:  Basename 07/08/11 1130 07/07/11 0443  NA 131* 132*  K 4.7 4.2  CL 94* 96  CO2 24 25  GLUCOSE 206* 139*  BUN 42* 31*  CREATININE 8.01* 6.12*  CALCIUM 9.7 8.6  MG -- --  PHOS 7.0* --   Liver Function Tests: No results found for this basename: AST:2,ALT:2,ALKPHOS:2,BILITOT:2,PROT:2,ALBUMIN:2 in the last 72 hours No results found for this basename: LIPASE:2,AMYLASE:2 in the last 72 hours No results found for this basename: AMMONIA:2 in the last 72 hours CBC:  Basename 07/08/11 1130 07/07/11 0443  WBC 6.9 6.5  NEUTROABS -- --  HGB 8.3* 7.8*  HCT 25.0* 24.2*  MCV 95.1 96.0  PLT 384 306   Cardiac Enzymes: No results found for this basename: CKTOTAL:3,CKMB:3,CKMBINDEX:3,TROPONINI:3 in the last 72 hours BNP: No results found for this basename: PROBNP:3 in  the last 72 hours D-Dimer: No results found for this basename: DDIMER:2 in the last 72 hours CBG:  Basename 07/08/11 1146 07/08/11 0741 07/07/11 1941 07/07/11 0730 07/07/11 0402 07/06/11 2359  GLUCAP 194* 135* 450* 142* 128* 211*   Hemoglobin A1C: No results found for this basename: HGBA1C in the last 72 hours Fasting Lipid Panel: No results found for this basename: CHOL,HDL,LDLCALC,TRIG,CHOLHDL,LDLDIRECT in the last 72 hours Thyroid Function Tests: No results found for this basename: TSH,T4TOTAL,FREET4,T3FREE,THYROIDAB in the last 72 hours Anemia Panel: No results found for this basename: VITAMINB12,FOLATE,FERRITIN,TIBC,IRON,RETICCTPCT in the last 72 hours Coagulation: No results found for this basename: LABPROT:2,INR:2 in the last 72 hours Urine Drug Screen: Drugs of Abuse     Component Value Date/Time   LABOPIA POSITIVE* 05/21/2009 2300   COCAINSCRNUR NONE DETECTED 05/21/2009 2300   LABBENZ NONE DETECTED 05/21/2009 2300   AMPHETMU NONE DETECTED 05/21/2009 2300   THCU NONE DETECTED 05/21/2009 2300   LABBARB  Value: NONE DETECTED        DRUG SCREEN FOR MEDICAL PURPOSES ONLY.  IF CONFIRMATION IS NEEDED FOR ANY PURPOSE, NOTIFY LAB WITHIN 5 DAYS.        LOWEST DETECTABLE LIMITS FOR URINE DRUG SCREEN Drug Class       Cutoff (ng/mL) Amphetamine      1000 Barbiturate      200 Benzodiazepine   200 Tricyclics       300 Opiates          300 Cocaine  300 THC              50 05/21/2009 2300    Alcohol Level: No results found for this basename: ETH:2 in the last 72 hours Urinalysis: No results found for this basename: COLORURINE:2,APPERANCEUR:2,LABSPEC:2,PHURINE:2,GLUCOSEU:2,HGBUR:2,BILIRUBINUR:2,KETONESUR:2,PROTEINUR:2,UROBILINOGEN:2,NITRITE:2,LEUKOCYTESUR:2 in the last 72 hours  Recent Results (from the past 240 hour(s))  MRSA PCR SCREENING     Status: Abnormal   Collection Time   06/29/11  1:10 AM      Component Value Range Status Comment   MRSA by PCR POSITIVE (*) NEGATIVE  Final     CULTURE, BLOOD (SINGLE)     Status: Normal   Collection Time   06/30/11 12:30 AM      Component Value Range Status Comment   Specimen Description Blood PORTA CATH   Final    Special Requests     Final    Value: BOTTLES DRAWN AEROBIC AND ANAEROBIC 7CC DRAWN BY RN   Culture  Setup Time [id number]   Final    Culture     Final    Value: METHICILLIN RESISTANT STAPHYLOCOCCUS AUREUS     Note: RIFAMPIN AND GENTAMICIN SHOULD NOT BE USED AS SINGLE DRUGS FOR TREATMENT OF STAPH INFECTIONS. This organism DOES NOT demonstrate inducible Clindamycin resistance in vitro. CRITICAL RESULT CALLED TO, READ BACK BY AND VERIFIED WITH: [facility] MURRELL      07/01/11 1315 BY SMITHERSJ     Note: Gram Stain Report Called to,Read Back By and Verified With: JARRELL W. AT 12PM ON [id number] BY THOMPSON S. Performed at [facility]   Report Status 07/02/2011 FINAL   Final    Organism ID, Bacteria METHICILLIN RESISTANT STAPHYLOCOCCUS AUREUS   Final   STOOL CULTURE     Status: Normal   Collection Time   06/30/11 10:45 AM      Component Value Range Status Comment   Specimen Description STOOL   Final    Special Requests NONE   Final    Culture     Final    Value: NO SALMONELLA, SHIGELLA, CAMPYLOBACTER, YERSINIA, OR E.COLI 0157:H7 ISOLATED   Report Status 07/04/2011 FINAL   Final   CLOSTRIDIUM DIFFICILE BY PCR     Status: Normal   Collection Time   06/30/11 10:45 AM      Component Value Range Status Comment   C difficile by pcr NEGATIVE  NEGATIVE  Final   MRSA PCR SCREENING     Status: Abnormal   Collection Time   07/02/11  9:09 AM      Component Value Range Status Comment   MRSA by PCR POSITIVE (*) NEGATIVE  Final   SURGICAL PCR SCREEN     Status: Abnormal   Collection Time   07/03/11  6:26 PM      Component Value Range Status Comment   MRSA, PCR POSITIVE (*) NEGATIVE  Final    Staphylococcus aureus POSITIVE (*) NEGATIVE  Final   CATH TIP CULTURE     Status: Normal   Collection Time   07/04/11  8:45 AM       Component Value Range Status Comment   Specimen Description CATH TIP PORTA CATH   Final    Special Requests VANCOMYCIN   Final    Culture NO GROWTH 2 DAYS   Final    Report Status 07/06/2011 FINAL   Final   CATH TIP CULTURE     Status: Normal   Collection Time   07/04/11  8:45 AM      Component  Value Range Status Comment   Specimen Description CATH TIP HEMODIALYSIS CATHETER   Final    Special Requests NONE   Final    Culture     Final    Value: 15 COLONIES METHICILLIN RESISTANT STAPHYLOCOCCUS AUREUS     Note: RIFAMPIN AND GENTAMICIN SHOULD NOT BE USED AS SINGLE DRUGS FOR TREATMENT OF STAPH INFECTIONS. This organism DOES NOT demonstrate inducible Clindamycin resistance in vitro. CRITICAL RESULT CALLED TO, READ BACK BY AND VERIFIED WITH: CATHY B@1022  ON      [id number] BY [facility]   Report Status 07/07/2011 FINAL   Final    Organism ID, Bacteria METHICILLIN RESISTANT STAPHYLOCOCCUS AUREUS   Final   CULTURE, BLOOD (ROUTINE X 2)     Status: Normal (Preliminary result)   Collection Time   07/05/11  6:00 PM      Component Value Range Status Comment   Specimen Description BLOOD PORTA CATH   Final    Special Requests BOTTLES DRAWN AEROBIC AND ANAEROBIC 5CC   Final    Culture NO GROWTH 3 DAYS   Final    Report Status PENDING   Incomplete   CULTURE, BLOOD (ROUTINE X 2)     Status: Normal (Preliminary result)   Collection Time   07/05/11  6:05 PM      Component Value Range Status Comment   Specimen Description Blood PORTA CATH   Final    Special Requests BOTTLES DRAWN AEROBIC AND ANAEROBIC 10CC   Final    Culture NO GROWTH 3 DAYS   Final    Report Status PENDING   Incomplete     Studies/Results: No results found.  Medications: Scheduled Meds:   . dextrose  25 g Intravenous Once  . epoetin alfa  12,000 Units Intravenous 3 times weekly  .  HYDROmorphone (DILAUDID) injection  0.5 mg Intravenous Once  . HYDROmorphone  1 mg Intravenous Once  . insulin aspart  0-5 Units Subcutaneous QHS  . insulin  aspart  0-9 Units Subcutaneous TID WC  . insulin aspart  10 Units Intravenous Once  . insulin aspart  10 Units Subcutaneous Once  . insulin aspart  15 Units Subcutaneous Once  . labetalol  300 mg Oral TID  . levETIRAcetam  500 mg Oral BID  . senna-docusate  1 tablet Oral BID  . sodium chloride  10-40 mL Intracatheter Q12H  . sodium chloride  3 mL Intravenous Q12H  . sodium chloride      . vancomycin  750 mg Intravenous Q M,W,F-HD  . DISCONTD: insulin aspart  0-9 Units Subcutaneous Q4H   Continuous Infusions:  PRN Meds:.bisacodyl, heparin, heparin, ondansetron (ZOFRAN) IV, ondansetron, oxyCODONE, sodium chloride, sodium chloride, traZODone, DISCONTD: heparin, DISCONTD: heparin  Assessment/Plan:  Principal Problem:  *Hyperkalemia Active Problems:  End stage renal disease  Nausea & vomiting  Abdominal pain  Colitis, acute  Plan:  1. Hypotension, appears to have improved, may have been related to meds.  2. Acute respiratory failure, unclear etiology, now resolved and patient breathing comfortably on room air  3. MRSA bacteremia on vancomycin. Repeat blood cultures have shown no growth. Patient had portacath and dialysis catheter removed. She now has a temporary femoral dialysis catheter. Plans are to have permanent catheter placed tomorrow.  4. ESRD on HD  5. DM1, cbgs stable  6. Anemia of renal disease, continue to follow  6. Dispo. Possible discharge tomorrow after permanent dialysis catheter placed.   LOS: 10 days   Krishna Heuer [facility] Pager: [phone] 07/08/2011, 3:45 PM

## 2011-07-08 NOTE — Progress Notes (Signed)
Discussed with Dr. Kerry Hough the patients HR and BP after dialysis.  He stated it was okay yo hold Labetalol since she will get a pm dose tonight. I told him I held this am labetalol due to pt going to dialysis.

## 2011-07-08 NOTE — Plan of Care (Signed)
Problem: Phase III Progression Outcomes Goal: Pain controlled on oral analgesia Outcome: Not Progressing Had to discuss with MD pain control.

## 2011-07-08 NOTE — Progress Notes (Signed)
Notified Dr. Kerry Hough of patients c/o abdominal pain at a 10.  I voiced to him that oxy IR was given to her and on reassessment the patient was slumped over appearing to be asleep.  She responded by her name being called.  I asked her how was her pain and she stated she was still hurting.  MD states to continue to assess her and if the patient is awake and alert and continues to call for pain medication within the next hour to re page him.  I told him I would monitor her and let him know.

## 2011-07-08 NOTE — Progress Notes (Signed)
Subjective: Interval History: has no complaint of no complaints of nausea vomiting. Patient denies also any difficulty increasing..  Objective: Vital signs in last 24 hours: Temp:  [97 F (36.1 C)-98.3 F (36.8 C)] 97 F (36.1 C) (06/03 0600) Pulse Rate:  [105-108] 107  (06/03 0717) Resp:  [12-18] 18  (06/03 0600) BP: (96-132)/(58-88) 132/88 mmHg (06/03 0600) SpO2:  [98 %-100 %] 98 % (06/03 0717) Weight:  [65.182 kg (143 lb 11.2 oz)] 65.182 kg (143 lb 11.2 oz) (06/03 0600) Weight change: -0.918 kg (-2 lb 0.4 oz)  Intake/Output from previous day: 06/02 0701 - 06/03 0700 In: 904 [P.O.:890; IV Piggyback:14] Out: -  Intake/Output this shift:    General appearance: alert, cooperative and no distress Resp: clear to auscultation bilaterally Cardio: regular rate and rhythm, S1, S2 normal, no murmur, click, rub or gallop GI: soft, non-tender; bowel sounds normal; no masses,  no organomegaly Extremities: extremities normal, atraumatic, no cyanosis or edema  Lab Results:  Basename 07/07/11 0443 07/06/11 0504  WBC 6.5 7.5  HGB 7.8* 8.6*  HCT 24.2* 26.7*  PLT 306 342   BMET:  Basename 07/07/11 0443 07/06/11 0504  NA 132* 129*  K 4.2 5.0  CL 96 95*  CO2 25 24  GLUCOSE 139* 165*  BUN 31* 49*  CREATININE 6.12* 9.15*  CALCIUM 8.6 9.7   No results found for this basename: PTH:2 in the last 72 hours Iron Studies: No results found for this basename: IRON,TIBC,TRANSFERRIN,FERRITIN in the last 72 hours  Studies/Results: No results found.  I have reviewed the patient's current medications.  Assessment/Plan: Problem #1 end-stage renal disease she status post hemodialysis on Saturday her BUN is 31 creatinine 6.12 yesterday. Her potassium is 4.2. Presently blood work today is not done because of lack of access Problem #2 hypertension her blood pressure seems to be controlled reasonably good Problem #3 anemia her hemoglobin 7.8 hematocrit 24 .2 seems to be declining. Problem #4 MRSA  sepsis she is on vancomycin her MediPort and also dialysis catheter has been removed presently she is a febrile her white blood cell count is normal. Problem #5 history of diabetes Problem #6 history of nausea vomiting presently she doesn't have any complaints. Her Plan: We'll do hemodialysis today and hopefully she'll have her access placed tomorrow. We'll check her CBC and basic metabolic panel in the morning.    LOS: 10 days   Trayton Szabo S 07/08/2011,7:49 AM

## 2011-07-09 ENCOUNTER — Ambulatory Visit (HOSPITAL_COMMUNITY)
Admission: RE | Admit: 2011-07-09 | Discharge: 2011-07-09 | Disposition: A | Discharge status: Home / Self Care | Payer: Medicaid Other | Source: Ambulatory Visit

## 2011-07-09 ENCOUNTER — Encounter (HOSPITAL_COMMUNITY): Payer: Self-pay

## 2011-07-09 ENCOUNTER — Ambulatory Visit (HOSPITAL_COMMUNITY)
Admit: 2011-07-09 | Discharge: 2011-07-09 | Disposition: A | Discharge status: Home / Self Care | Payer: Medicaid Other | Attending: Nephrology | Admitting: Nephrology

## 2011-07-09 ENCOUNTER — Encounter (HOSPITAL_COMMUNITY): Payer: Self-pay | Admitting: Internal Medicine

## 2011-07-09 DIAGNOSIS — N186 End stage renal disease: Secondary | ICD-10-CM

## 2011-07-09 DIAGNOSIS — N19 Unspecified kidney failure: Secondary | ICD-10-CM | POA: Insufficient documentation

## 2011-07-09 DIAGNOSIS — A4102 Sepsis due to Methicillin resistant Staphylococcus aureus: Secondary | ICD-10-CM

## 2011-07-09 DIAGNOSIS — R112 Nausea with vomiting, unspecified: Secondary | ICD-10-CM

## 2011-07-09 DIAGNOSIS — R109 Unspecified abdominal pain: Secondary | ICD-10-CM

## 2011-07-09 DIAGNOSIS — I059 Rheumatic mitral valve disease, unspecified: Secondary | ICD-10-CM

## 2011-07-09 LAB — BASIC METABOLIC PANEL
BUN: 28 mg/dL — ABNORMAL HIGH (ref 6–23)
CO2: 25 mEq/L (ref 19–32)
Calcium: 9.7 mg/dL (ref 8.4–10.5)
GFR calc non Af Amer: 9 mL/min — ABNORMAL LOW (ref >90)
Glucose, Bld: 217 mg/dL — ABNORMAL HIGH (ref 70–99)

## 2011-07-09 LAB — GLUCOSE, CAPILLARY
Glucose-Capillary: 311 mg/dL — ABNORMAL HIGH (ref 70–99)
Glucose-Capillary: 175 mg/dL — ABNORMAL HIGH (ref 70–99)

## 2011-07-09 MED ORDER — MIDAZOLAM HCL 5 MG/5ML IJ SOLN
INTRAMUSCULAR | Status: AC | PRN
  Administered 2011-07-09: 1 mg via INTRAVENOUS
  Administered 2011-07-09: 0.5 mg via INTRAVENOUS

## 2011-07-09 MED ORDER — VANCOMYCIN HCL 1000 MG IV SOLR: 750.0000 mg | INTRAVENOUS | Status: DC | PRN

## 2011-07-09 MED ORDER — METOCLOPRAMIDE HCL 10 MG PO TABS: 10.0000 mg | ORAL_TABLET | Freq: Three times a day (TID) | ORAL | Status: DC | PRN

## 2011-07-09 MED ORDER — OXYCODONE HCL 5 MG PO TABS: 5.0000 mg | ORAL_TABLET | Freq: Four times a day (QID) | ORAL | Status: AC | PRN

## 2011-07-09 MED ORDER — FENTANYL CITRATE 0.05 MG/ML IJ SOLN
INTRAMUSCULAR | Status: AC | PRN
  Administered 2011-07-09: 50 ug via INTRAVENOUS

## 2011-07-09 MED ORDER — LABETALOL HCL 300 MG PO TABS: 300.0000 mg | ORAL_TABLET | Freq: Three times a day (TID) | ORAL | Status: DC

## 2011-07-09 MED ORDER — VANCOMYCIN HCL IN DEXTROSE 1-5 GM/200ML-% IV SOLN
1000.0000 mg | Freq: Once | INTRAVENOUS | Status: AC
  Administered 2011-07-09: 1000 mg via INTRAVENOUS
  Filled 2011-07-09: qty 200

## 2011-07-09 NOTE — Discharge Summary (Addendum)
Physician Discharge Summary  Patient ID: MERIAH [name] MRN: [id number] DOB/AGE: 25/03/1985 25 y.o.  Admit date: 06/28/2011 Discharge date: 07/10/2011  Primary Care Physician:  [name], MD, MD   Discharge Diagnoses:    1. MRSA bacteremia on vancomycin 2. ESRD on HD 3, DM1, uncontrolled with nephropathy and retinopathy 4. Anemia of renal disease 5. Hypotension likely due to medications, resolved 6. History of hypertension 7. Acute respiratory failure, resolved. 8. Chronic pain syndrome 9. Legally Blind 10. Nausea/Vomiting, resolved 11. Hyperkalemia 12. Resting Tachycardia 13. Sigmoid colitis, C diff negative, completed treatment    Medication List  As of 07/09/2011  5:40 PM   STOP taking these medications         amLODipine 10 MG tablet      cloNIDine 0.3 mg/24hr         TAKE these medications         ALPRAZolam 0.5 MG tablet   Commonly known as: XANAX   Take 0.5 mg by mouth 2 (two) times daily as needed. For nerves/anxiety      FOSRENOL 1000 MG chewable tablet   Generic drug: lanthanum   Chew 1 tablet by mouth 3 (three) times daily with meals.      insulin glargine 100 UNIT/ML injection   Commonly known as: LANTUS   Inject 25 Units into the skin at bedtime.      insulin lispro 100 UNIT/ML injection   Commonly known as: HUMALOG   Inject 5-20 Units into the skin 3 (three) times daily before meals. Per sliding scale. Pt takes anywhere from 5 to 20 units.      labetalol 300 MG tablet   Commonly known as: NORMODYNE   Take 1 tablet (300 mg total) by mouth 3 (three) times daily.      levETIRAcetam 500 MG tablet   Commonly known as: KEPPRA   Take 500 mg by mouth 2 (two) times daily.      metoCLOPramide 10 MG tablet   Commonly known as: REGLAN   Take 1 tablet (10 mg total) by mouth 3 (three) times daily with meals as needed.      multivitamin Tabs tablet   Take 1 tablet by mouth daily.      oxyCODONE 5 MG immediate release tablet   Commonly known as:  Oxy IR/ROXICODONE   Take 1 tablet (5 mg total) by mouth every 6 (six) hours as needed.      sertraline 50 MG tablet   Commonly known as: ZOLOFT   Take 50 mg by mouth daily as needed.      sodium chloride 0.9 % SOLN 150 mL with vancomycin 1000 MG SOLR 750 mg   Inject 750 mg into the vein every hemodialysis. Until 07/21/11           Physical Exam: Blood pressure 127/87, pulse 99, temperature 98.1 F (36.7 C), temperature source Oral, resp. rate 18, height 5\' 8"  (1.727 m), weight 62.324 kg (137 lb 6.4 oz), SpO2 100.00%. NAD CTA B S1, S2, RRR Soft, NT, BS+ No edema b/l  Disposition and Follow-up:  Patient will be discharged home and follow up at dialysis center for vancomycin  Consults:  Nephrology, Dr. [name] General surgery, Dr. Lovell Sheehan. Interventional Radiology for dialysis catheter placement   Significant Diagnostic Studies:  Ir Fluoro Guide Cv Line Left  07/09/2011  *RADIOLOGY REPORT*  Clinical Data/Indication: RENAL FAILURE  TUNNELED DIALYSIS CATHETER PLACEMENT, ULTRASOUND GUIDANCE FOR VASCULAR ACCESS  Sedation: Versed 1.5 mg, Fentanyl 50 mcg.  Total  Moderate Sedation Time: 20 minutes.  Vancomycin was given within two hours of incision.  Vancomycin was given due to an antibiotic allergy.  Fluoroscopy Time: 0.3 minutes.  Procedure: The procedure, risks, benefits, and alternatives were explained to the patient. Questions regarding the procedure were encouraged and answered. The patient understands and consents to the procedure.  The left chest and neck wereprepped with eight betadine in a sterile fashion, and a sterile drape was applied covering the operative field. A sterile gown and sterile gloves were used for the procedure. 1% lidocaine into the skin and subcutaneous tissue. The the left internal jugular vein was noted to be patent initially with ultrasound.  Under sonographic guidance, a micropuncture needle was inserted into the left IJ vein (Ultrasound and fluoroscopic image  documentation was performed). It was removed over an 018 wire which was upsized to an Amplatz.  This was advanced into the IVC.  A small incision was made in the left upper chest.  The tunneling device was utilized to advance the 23 centimeter tip to cuff catheter from the chest incision and out the neck incision.  A peel- away sheath was advanced over the Amplatz wire.  The leading edge of the catheter was then advanced through the peel-away sheath. The peel-away sheath was removed.  It was flushed and instilled with heparin.  The chest incision was closed with a 0 Prolene pursestring stitch.  The neck incision was closed with a 4-0 Vicryl subcuticular stitch.  No complications.  FINDINGS: The image demonstrates placement of a tunneled dialysis catheter with its tip in the right atrium.  IMPRESSION: Successful right IJ vein tunneled dialysis catheter with its tip in the right atrium.  Original Report Authenticated By: Donavan Burnet, M.D.   Ir US Guide Vasc Access Left  07/09/2011  *RADIOLOGY REPORT*  Clinical Data/Indication: RENAL FAILURE  TUNNELED DIALYSIS CATHETER PLACEMENT, ULTRASOUND GUIDANCE FOR VASCULAR ACCESS  Sedation: Versed 1.5 mg, Fentanyl 50 mcg.  Total Moderate Sedation Time: 20 minutes.  Vancomycin was given within two hours of incision.  Vancomycin was given due to an antibiotic allergy.  Fluoroscopy Time: 0.3 minutes.  Procedure: The procedure, risks, benefits, and alternatives were explained to the patient. Questions regarding the procedure were encouraged and answered. The patient understands and consents to the procedure.  The left chest and neck wereprepped with eight betadine in a sterile fashion, and a sterile drape was applied covering the operative field. A sterile gown and sterile gloves were used for the procedure. 1% lidocaine into the skin and subcutaneous tissue. The the left internal jugular vein was noted to be patent initially with ultrasound.  Under sonographic guidance, a  micropuncture needle was inserted into the left IJ vein (Ultrasound and fluoroscopic image documentation was performed). It was removed over an 018 wire which was upsized to an Amplatz.  This was advanced into the IVC.  A small incision was made in the left upper chest.  The tunneling device was utilized to advance the 23 centimeter tip to cuff catheter from the chest incision and out the neck incision.  A peel- away sheath was advanced over the Amplatz wire.  The leading edge of the catheter was then advanced through the peel-away sheath. The peel-away sheath was removed.  It was flushed and instilled with heparin.  The chest incision was closed with a 0 Prolene pursestring stitch.  The neck incision was closed with a 4-0 Vicryl subcuticular stitch.  No complications.  FINDINGS: The image demonstrates placement of  a tunneled dialysis catheter with its tip in the right atrium.  IMPRESSION: Successful right IJ vein tunneled dialysis catheter with its tip in the right atrium.  Original Report Authenticated By: Donavan Burnet, M.D.    Brief H and P: For complete details please refer to admission H and P, but in brief 26 year old female with end-stage renal disease dialysis dependent who presents to the emergency department with 2 days of nausea vomiting diarrhea and abdominal pain. Because of her acute illness today she missed dialysis today comes in with a high potassium level and is currently getting emergent dialysis in the ICU. She states that this abdominal pain is worse than what it normally is and her vomiting is worse. Her history is somewhat unreliable and inconsistent. She says she's been running fevers. She denies any sick contacts.   Hospital Course:  This lady was admitted to the hospital with nausea, vomiting and abd pain.  She was found to be hyperkalemic requiring emergent dialysis and CT scan of abd revealed possible sigmoid colitis. She was admitted to the step down unit and was started  empirically on cipro and flagyl.  C diff was negative.  Her symptoms improved and her diet was advanced.  Her antibiotics were discontinued.  Unfortunately her blood cultures were positive for MRSA. She was started on vancomycin.  Her dialysis catheter and mediport were removed by Dr. Lovell Sheehan.  She was transferred to the medical floor and became hypotensive and had difficulty breathing.  She required transfer back to the step down unit and a temporary femoral dialysis catheter was placed since she did not have any IV access.  V/Q scan was negative for PE.  She was monitored in the step down unit and clinically stabilized.  Her blood pressure medications were adjusted.  She is currently only on labetolol. Today she had gone to [facility] for interventional radiology to place a permanent dialysis catheter. Hopefully in the morning, her temporary dialysis catheter can be removed and patient can be discharged home. She will continue a total of 3 weeks of vancomycin which will be given in dialysis.  Her repeat blood cultures have shown no growth. If there is any change in her hospital course, an addendum will be dictated.  Time spent on Discharge:  Signed: MEMON,JEHANZEB [facility] Pager: [phone] 07/09/2011, 5:40 PM  Addendum: Dr. Franky Macho, surgery, as removed the left femoral vein dialysis catheter without difficulty. There've been no changes since yesterday clinically to the patient and the discharge summary dictated above is current. The patient will need a further 3 doses of vancomycin, each time with dialysis. This would complete approximately 3 weeks course for the MRSA bacteremia.

## 2011-07-09 NOTE — Procedures (Signed)
LIJV HD Catheter SVC RA

## 2011-07-09 NOTE — Progress Notes (Signed)
[name]  MRN: [id number]  DOB/AGE: 08/07/1985 26 y.o.  Primary Care Physician:HASANAJ,XAJE A, MD, MD  Admit date: 06/28/2011  Chief Complaint:  Chief Complaint  Patient presents with  . Emesis  . Abdominal Pain    S-Pt presented on  06/28/2011 with  Chief Complaint  Patient presents with  . Emesis  . Abdominal Pain  .    Pt awaiting transfer for access placement. Meds    . dextrose  25 g Intravenous Once  . epoetin alfa  12,000 Units Intravenous 3 times weekly  .  HYDROmorphone (DILAUDID) injection  0.5 mg Intravenous Once  . insulin aspart  0-5 Units Subcutaneous QHS  . insulin aspart  0-9 Units Subcutaneous TID WC  . insulin aspart  10 Units Intravenous Once  . labetalol  300 mg Oral TID  . levETIRAcetam  500 mg Oral BID  . metoCLOPramide (REGLAN) injection  10 mg Intravenous Q8H  . senna-docusate  1 tablet Oral BID  . sodium chloride  10-40 mL Intracatheter Q12H  . sodium chloride  3 mL Intravenous Q12H  . vancomycin  750 mg Intravenous Q M,W,F-HD       Physical Exam: Vital signs in last 24 hours: Temp:  [98.3 F (36.8 C)-98.7 F (37.1 C)] 98.7 F (37.1 C) (06/04 0630) Pulse Rate:  [100-117] 107  (06/04 0709) Resp:  [16-20] 20  (06/04 0630) BP: (92-142)/(61-97) 116/77 mmHg (06/04 0630) SpO2:  [98 %-100 %] 98 % (06/04 0709) Weight:  [137 lb 6.4 oz (62.324 kg)-144 lb 10 oz (65.6 kg)] 137 lb 6.4 oz (62.324 kg) (06/04 0641) Weight change: 14.8 oz (0.418 kg) Last BM Date: 07/08/11  Intake/Output from previous day: 06/03 0701 - 06/04 0700 In: 240 [P.O.:240] Out: 2950  Total I/O In: 146.3 [I.V.:146.3] Out: -    Physical Exam: General- pt is awake,alert, oriented to time place and person Resp- No acute REsp distress, CTA B/L NO Rhonchi CVS- S1S2 regular ij rate and rhythm GIT- BS+, soft, NT, ND EXT- NO LE Edema, Cyanosis   Lab Results: CBC  Basename 07/08/11 1130 07/07/11 0443  WBC 6.9 6.5  HGB 8.3* 7.8*  HCT 25.0* 24.2*  PLT 384 306      BMET  Basename 07/09/11 0822 07/08/11 1130  NA 131* 131*  K 4.5 4.7  CL 93* 94*  CO2 25 24  GLUCOSE 217* 206*  BUN 28* 42*  CREATININE 5.98* 8.01*  CALCIUM 9.7 9.7    MICRO Recent Results (from the past 240 hour(s))  CULTURE, BLOOD (SINGLE)     Status: Normal   Collection Time   06/30/11 12:30 AM      Component Value Range Status Comment   Specimen Description Blood PORTA CATH   Final    Special Requests     Final    Value: BOTTLES DRAWN AEROBIC AND ANAEROBIC 7CC DRAWN BY RN   Culture  Setup Time [id number]   Final    Culture     Final    Value: METHICILLIN RESISTANT STAPHYLOCOCCUS AUREUS     Note: RIFAMPIN AND GENTAMICIN SHOULD NOT BE USED AS SINGLE DRUGS FOR TREATMENT OF STAPH INFECTIONS. This organism DOES NOT demonstrate inducible Clindamycin resistance in vitro. CRITICAL RESULT CALLED TO, READ BACK BY AND VERIFIED WITH: [facility] MURRELL      07/01/11 1315 BY SMITHERSJ     Note: Gram Stain Report Called to,Read Back By and Verified With: JARRELL W. AT 12PM ON [id number] BY THOMPSON S. Performed at [facility]  Report Status 07/02/2011 FINAL   Final    Organism ID, Bacteria METHICILLIN RESISTANT STAPHYLOCOCCUS AUREUS   Final   STOOL CULTURE     Status: Normal   Collection Time   06/30/11 10:45 AM      Component Value Range Status Comment   Specimen Description STOOL   Final    Special Requests NONE   Final    Culture     Final    Value: NO SALMONELLA, SHIGELLA, CAMPYLOBACTER, YERSINIA, OR E.COLI 0157:H7 ISOLATED   Report Status 07/04/2011 FINAL   Final   CLOSTRIDIUM DIFFICILE BY PCR     Status: Normal   Collection Time   06/30/11 10:45 AM      Component Value Range Status Comment   C difficile by pcr NEGATIVE  NEGATIVE  Final   MRSA PCR SCREENING     Status: Abnormal   Collection Time   07/02/11  9:09 AM      Component Value Range Status Comment   MRSA by PCR POSITIVE (*) NEGATIVE  Final   SURGICAL PCR SCREEN     Status: Abnormal   Collection Time   07/03/11   6:26 PM      Component Value Range Status Comment   MRSA, PCR POSITIVE (*) NEGATIVE  Final    Staphylococcus aureus POSITIVE (*) NEGATIVE  Final   CATH TIP CULTURE     Status: Normal   Collection Time   07/04/11  8:45 AM      Component Value Range Status Comment   Specimen Description CATH TIP PORTA CATH   Final    Special Requests VANCOMYCIN   Final    Culture NO GROWTH 2 DAYS   Final    Report Status 07/06/2011 FINAL   Final   CATH TIP CULTURE     Status: Normal   Collection Time   07/04/11  8:45 AM      Component Value Range Status Comment   Specimen Description CATH TIP HEMODIALYSIS CATHETER   Final    Special Requests NONE   Final    Culture     Final    Value: 15 COLONIES METHICILLIN RESISTANT STAPHYLOCOCCUS AUREUS     Note: RIFAMPIN AND GENTAMICIN SHOULD NOT BE USED AS SINGLE DRUGS FOR TREATMENT OF STAPH INFECTIONS. This organism DOES NOT demonstrate inducible Clindamycin resistance in vitro. CRITICAL RESULT CALLED TO, READ BACK BY AND VERIFIED WITH: CATHY B@1022  ON      [id number] BY [facility]   Report Status 07/07/2011 FINAL   Final    Organism ID, Bacteria METHICILLIN RESISTANT STAPHYLOCOCCUS AUREUS   Final   CULTURE, BLOOD (ROUTINE X 2)     Status: Normal (Preliminary result)   Collection Time   07/05/11  6:00 PM      Component Value Range Status Comment   Specimen Description BLOOD PORTA CATH   Final    Special Requests BOTTLES DRAWN AEROBIC AND ANAEROBIC 5CC   Final    Culture NO GROWTH 3 DAYS   Final    Report Status PENDING   Incomplete   CULTURE, BLOOD (ROUTINE X 2)     Status: Normal (Preliminary result)   Collection Time   07/05/11  6:05 PM      Component Value Range Status Comment   Specimen Description Blood PORTA CATH   Final    Special Requests BOTTLES DRAWN AEROBIC AND ANAEROBIC 10CC   Final    Culture NO GROWTH 3 DAYS   Final    Report Status  PENDING   Incomplete       Lab Results  Component Value Date   PTH 256.0* 07/04/2011   CALCIUM 9.7 07/09/2011   CAION  1.16 06/04/2010   PHOS 7.0* 07/08/2011                                            Impression: 1)Renal  ESRD on HD  Pt on MWF schedule  Pt last dialyzed yesterday   2)HTN  Target Organ damage  CKD  Hypertrophic Cardiomyopathy   Medication-   On Alpha and beta Blockers     3)Anemia HGb at goal (9--11)  On Epo   4)CKD Mineral-Bone Disorder  PTH acceptable Secondary Hyperparathyroidism present Phosphorus NOT at goal.  .  5)ID- MRSA Bactremia - on Iv vanco  Permanent Catheter and Mediport Removed Temporary placed Repeat Cultures Negative  6)FEN  Normokalemic  Hyponatremic-secondary to ESRD   7)Acid base  Co2 at goal  Plan: Pt to go for access placement at [facility] cone- required [employer] form filled. Will dialyze in am -after access placement. Pt will need 2-3 weeks of IV antibiotic therapy( started on vanco 5/26)will need till June 16.     Shakiya Mcneary S 07/09/2011, 9:39 AM

## 2011-07-09 NOTE — H&P (Signed)
[name] is an 26 y.o. female.   Chief Complaint: ESRD; sepsis; N/V/D +BC 5/26;  Neg x2 5/31 PAC tip: neg 5/30 (left) HD cath tip: + MRSA 5/30 (right) Has been dialyzing with left femoral temp cath since 5/27 Scheduled now for Permanent dialysis catheter  HPI: blindness; HTN; ESRD; DM; anemia; glaucoma; MRSA  Past Medical History  Diagnosis Date  . Diabetes mellitus     insulin dependent diabetes mellitus with history of poor control  . Hypertension   . Hyperlipidemia   . Nephrotic syndrome   . Anemia     chronic,  Dr. Mariel Sleet,  Transfused, May, 2012  . 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 Hypertrophic cardiomyopathy     with diastolic heart dysfunction  . 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 Vaginosis   . History of MRSA infection     multiple  . Glaucoma   . Warfarin anticoagulation     DVT  . Ejection fraction     55%, echo 12/2009  . Manic depressive disorder   . Noncompliance   . port-a-cath   . Dialysis patient     M-W-F    Past Surgical History  Procedure Date  . Left arm cyst removal   . Incise and drain abcess 04/24/11    for multiple MRSA -       March 2013 drain port of abcess  . Placement of port a cath     poor venous access  . Av fistula placement   . Hematoma evacuation 05/25/2011    Procedure: EVACUATION HEMATOMA;  Surgeon: Sherren Kerns, MD;  Location: [facility] OR;  Service: Vascular;  Laterality: Right;  Evacuation Hematoma Right Arm    Family History  Problem Relation Age of Onset  . Diabetes Mother   . Hypertension Mother   . Diabetes Father    Social History:  reports that she has never smoked. She does not have any smokeless tobacco history on file. She reports that she does not drink alcohol or use illicit drugs.  Allergies:  Allergies  Allergen Reactions  . Ancef (Cefazolin Sodium) Shortness Of Breath    wheezing  . Coumadin (Warfarin Sodium)     Eyes bleed    . Morphine And Related Itching  . Sulfa Antibiotics Anaphylaxis and Shortness Of Breath  . Tomato Hives and Swelling     (Not in a hospital admission)  Results for orders placed during the hospital encounter of 06/28/11 (from the past 48 hour(s))  GLUCOSE, CAPILLARY     Status: Abnormal   Collection Time   07/07/11  7:41 PM      Component Value Range Comment   Glucose-Capillary 450 (*) 70 - 99 (mg/dL)   GLUCOSE, CAPILLARY     Status: Abnormal   Collection Time   07/08/11  7:41 AM      Component Value Range Comment   Glucose-Capillary 135 (*) 70 - 99 (mg/dL)   BASIC METABOLIC PANEL     Status: Abnormal   Collection Time   07/08/11 11:30 AM      Component Value Range Comment   Sodium 131 (*) 135 - 145 (mEq/L)    Potassium 4.7  3.5 - 5.1 (mEq/L)    Chloride 94 (*) 96 - 112 (mEq/L)    CO2 24  19 - 32 (mEq/L)    Glucose, Bld 206 (*) 70 - 99 (mg/dL)  BUN 42 (*) 6 - 23 (mg/dL)    Creatinine, Ser 1.61 (*) 0.50 - 1.10 (mg/dL)    Calcium 9.7  8.4 - 10.5 (mg/dL)    GFR calc non Af Amer 6 (*) >90 (mL/min)    GFR calc Af Amer 7 (*) >90 (mL/min)   CBC     Status: Abnormal   Collection Time   07/08/11 11:30 AM      Component Value Range Comment   WBC 6.9  4.0 - 10.5 (K/uL)    RBC 2.63 (*) 3.87 - 5.11 (MIL/uL)    Hemoglobin 8.3 (*) 12.0 - 15.0 (g/dL)    HCT 09.6 (*) 04.5 - 46.0 (%)    MCV 95.1  78.0 - 100.0 (fL)    MCH 31.6  26.0 - 34.0 (pg)    MCHC 33.2  30.0 - 36.0 (g/dL)    RDW 40.9  81.1 - 91.4 (%)    Platelets 384  150 - 400 (K/uL)   PHOSPHORUS     Status: Abnormal   Collection Time   07/08/11 11:30 AM      Component Value Range Comment   Phosphorus 7.0 (*) 2.3 - 4.6 (mg/dL)   GLUCOSE, CAPILLARY     Status: Abnormal   Collection Time   07/08/11 11:46 AM      Component Value Range Comment   Glucose-Capillary 194 (*) 70 - 99 (mg/dL)   GLUCOSE, CAPILLARY     Status: Abnormal   Collection Time   07/08/11  5:07 PM      Component Value Range Comment   Glucose-Capillary 248 (*) 70 -  99 (mg/dL)   BASIC METABOLIC PANEL     Status: Abnormal   Collection Time   07/09/11  8:22 AM      Component Value Range Comment   Sodium 131 (*) 135 - 145 (mEq/L)    Potassium 4.5  3.5 - 5.1 (mEq/L)    Chloride 93 (*) 96 - 112 (mEq/L)    CO2 25  19 - 32 (mEq/L)    Glucose, Bld 217 (*) 70 - 99 (mg/dL)    BUN 28 (*) 6 - 23 (mg/dL)    Creatinine, Ser 7.82 (*) 0.50 - 1.10 (mg/dL)    Calcium 9.7  8.4 - 10.5 (mg/dL)    GFR calc non Af Amer 9 (*) >90 (mL/min)    GFR calc Af Amer 10 (*) >90 (mL/min)   GLUCOSE, CAPILLARY     Status: Abnormal   Collection Time   07/09/11 12:38 PM      Component Value Range Comment   Glucose-Capillary 311 (*) 70 - 99 (mg/dL)    Comment 1 Notify RN      No results found.  Review of Systems  Constitutional: Negative for fever.  Eyes:       Blind  Respiratory: Negative for cough.   Cardiovascular: Negative for chest pain.  Gastrointestinal: Positive for abdominal pain and diarrhea. Negative for nausea and vomiting.  Neurological: Positive for headaches.    There were no vitals taken for this visit. Physical Exam  Constitutional: She is oriented to person, place, and time. She appears well-developed and well-nourished.  Eyes:       bilat blindness..."only sees shadows"  Cardiovascular: Normal rate, regular rhythm and normal heart sounds.   No murmur heard. Respiratory: Effort normal and breath sounds normal. She has no wheezes.  GI: Soft. Bowel sounds are normal. There is no tenderness.  Musculoskeletal: Normal range of motion.  Neurological: She is  alert and oriented to person, place, and time.  Skin: Skin is warm and dry.  Psychiatric: She has a normal mood and affect. Her behavior is normal. Judgment and thought content normal.     Assessment/Plan ESRD; infected cath removed 5/30 Temp cath in L femoral site now Scheduled for new Perm dialysis catheter placement now Pt aware of procedure benefits and risks and agreeable to proceed. Consent  signed Glucose 359 - peripheral- insulin 6 units Novolog per Dr Bonnielee Haff  [facility] A 07/09/2011, 1:59 PM

## 2011-07-09 NOTE — Consult Note (Signed)
ANTIBIOTIC CONSULT NOTE - Follow-up  Pharmacy Consult for Vancomycin Indication: bacteremia  Allergies  Allergen Reactions  . Ancef (Cefazolin Sodium) Shortness Of Breath    wheezing  . Coumadin (Warfarin Sodium)     Eyes bleed  . Morphine And Related Itching  . Sulfa Antibiotics Anaphylaxis and Shortness Of Breath  . Tomato Hives and Swelling   Patient Measurements: Height: 5\' 8"  (172.7 cm) Weight: 137 lb 6.4 oz (62.324 kg) IBW/kg (Calculated) : 63.9   Vital Signs: Temp: 98.7 F (37.1 C) (06/04 0630) Temp src: Oral (06/04 0630) BP: 116/77 mmHg (06/04 0630) Pulse Rate: 107  (06/04 0709) Intake/Output from previous day: 06/03 0701 - 06/04 0700 In: 240 [P.O.:240] Out: 2950  Intake/Output from this shift: Total I/O In: 146.3 [I.V.:146.3] Out: -   Labs:  Basename 07/09/11 0822 07/08/11 1130 07/07/11 0443  WBC -- 6.9 6.5  HGB -- 8.3* 7.8*  PLT -- 384 306  LABCREA -- -- --  CREATININE 5.98* 8.01* 6.12*   Estimated Creatinine Clearance: 14.1 ml/min (by C-G formula based on Cr of 5.98). No results found for this basename: VANCOTROUGH:2,VANCOPEAK:2,VANCORANDOM:2,GENTTROUGH:2,GENTPEAK:2,GENTRANDOM:2,TOBRATROUGH:2,TOBRAPEAK:2,TOBRARND:2,AMIKACINPEAK:2,AMIKACINTROU:2,AMIKACIN:2, in the last 72 hours   Microbiology: Recent Results (from the past 720 hour(s))  MRSA PCR SCREENING     Status: Abnormal   Collection Time   06/29/11  1:10 AM      Component Value Range Status Comment   MRSA by PCR POSITIVE (*) NEGATIVE  Final   CULTURE, BLOOD (SINGLE)     Status: Normal   Collection Time   06/30/11 12:30 AM      Component Value Range Status Comment   Specimen Description Blood PORTA CATH   Final    Special Requests     Final    Value: BOTTLES DRAWN AEROBIC AND ANAEROBIC 7CC DRAWN BY RN   Culture  Setup Time [id number]   Final    Culture     Final    Value: METHICILLIN RESISTANT STAPHYLOCOCCUS AUREUS     Note: RIFAMPIN AND GENTAMICIN SHOULD NOT BE USED AS SINGLE DRUGS FOR  TREATMENT OF STAPH INFECTIONS. This organism DOES NOT demonstrate inducible Clindamycin resistance in vitro. CRITICAL RESULT CALLED TO, READ BACK BY AND VERIFIED WITH: [facility] MURRELL      07/01/11 1315 BY SMITHERSJ     Note: Gram Stain Report Called to,Read Back By and Verified With: JARRELL W. AT 12PM ON [id number] BY THOMPSON S. Performed at [facility]   Report Status 07/02/2011 FINAL   Final    Organism ID, Bacteria METHICILLIN RESISTANT STAPHYLOCOCCUS AUREUS   Final   STOOL CULTURE     Status: Normal   Collection Time   06/30/11 10:45 AM      Component Value Range Status Comment   Specimen Description STOOL   Final    Special Requests NONE   Final    Culture     Final    Value: NO SALMONELLA, SHIGELLA, CAMPYLOBACTER, YERSINIA, OR E.COLI 0157:H7 ISOLATED   Report Status 07/04/2011 FINAL   Final   CLOSTRIDIUM DIFFICILE BY PCR     Status: Normal   Collection Time   06/30/11 10:45 AM      Component Value Range Status Comment   C difficile by pcr NEGATIVE  NEGATIVE  Final   MRSA PCR SCREENING     Status: Abnormal   Collection Time   07/02/11  9:09 AM      Component Value Range Status Comment   MRSA by PCR POSITIVE (*) NEGATIVE  Final  SURGICAL PCR SCREEN     Status: Abnormal   Collection Time   07/03/11  6:26 PM      Component Value Range Status Comment   MRSA, PCR POSITIVE (*) NEGATIVE  Final    Staphylococcus aureus POSITIVE (*) NEGATIVE  Final   CATH TIP CULTURE     Status: Normal   Collection Time   07/04/11  8:45 AM      Component Value Range Status Comment   Specimen Description CATH TIP PORTA CATH   Final    Special Requests VANCOMYCIN   Final    Culture NO GROWTH 2 DAYS   Final    Report Status 07/06/2011 FINAL   Final   CATH TIP CULTURE     Status: Normal   Collection Time   07/04/11  8:45 AM      Component Value Range Status Comment   Specimen Description CATH TIP HEMODIALYSIS CATHETER   Final    Special Requests NONE   Final    Culture     Final    Value: 15  COLONIES METHICILLIN RESISTANT STAPHYLOCOCCUS AUREUS     Note: RIFAMPIN AND GENTAMICIN SHOULD NOT BE USED AS SINGLE DRUGS FOR TREATMENT OF STAPH INFECTIONS. This organism DOES NOT demonstrate inducible Clindamycin resistance in vitro. CRITICAL RESULT CALLED TO, READ BACK BY AND VERIFIED WITH: CATHY B@1022  ON      [id number] BY [facility]   Report Status 07/07/2011 FINAL   Final    Organism ID, Bacteria METHICILLIN RESISTANT STAPHYLOCOCCUS AUREUS   Final   CULTURE, BLOOD (ROUTINE X 2)     Status: Normal (Preliminary result)   Collection Time   07/05/11  6:00 PM      Component Value Range Status Comment   Specimen Description BLOOD PORTA CATH   Final    Special Requests BOTTLES DRAWN AEROBIC AND ANAEROBIC 5CC   Final    Culture NO GROWTH 3 DAYS   Final    Report Status PENDING   Incomplete   CULTURE, BLOOD (ROUTINE X 2)     Status: Normal (Preliminary result)   Collection Time   07/05/11  6:05 PM      Component Value Range Status Comment   Specimen Description Blood PORTA CATH   Final    Special Requests BOTTLES DRAWN AEROBIC AND ANAEROBIC 10CC   Final    Culture NO GROWTH 3 DAYS   Final    Report Status PENDING   Incomplete    Medical History: Past Medical History  Diagnosis Date  . Diabetes mellitus     insulin dependent diabetes mellitus with history of poor control  . Hypertension   . Hyperlipidemia   . Nephrotic syndrome   . Anemia     chronic,  Dr. Mariel Sleet,  Transfused, May, 2012  . 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 Hypertrophic cardiomyopathy     with diastolic heart dysfunction  . 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 Vaginosis   . History of MRSA infection     multiple  . Glaucoma   . Warfarin anticoagulation     DVT  . Ejection fraction     55%, echo 12/2009  . Manic depressive disorder   . Noncompliance   . port-a-cath   . Dialysis patient     M-W-F   Medications:  Scheduled:     . dextrose  25 g  Intravenous Once  .  epoetin alfa  12,000 Units Intravenous 3 times weekly  .  HYDROmorphone (DILAUDID) injection  0.5 mg Intravenous Once  . insulin aspart  0-5 Units Subcutaneous QHS  . insulin aspart  0-9 Units Subcutaneous TID WC  . insulin aspart  10 Units Intravenous Once  . labetalol  300 mg Oral TID  . levETIRAcetam  500 mg Oral BID  . metoCLOPramide (REGLAN) injection  10 mg Intravenous Q8H  . senna-docusate  1 tablet Oral BID  . sodium chloride  10-40 mL Intracatheter Q12H  . sodium chloride  3 mL Intravenous Q12H  . vancomycin  750 mg Intravenous Q M,W,F-HD  . vancomycin  1,000 mg Intravenous Once   Assessment: 26yo F on day#7 vancomycin for MRSA blood infection. Catheter was removed yesterday.  Catheter cultures were negative.  She is currently afebrile with normal WBC. PreHD level was therapeutic Continue Vancomycin until 6/16 per MD  Goal of Therapy:  Eradicate infection. Pre-HD vancomycin level 15-25 mcg/ml  Plan:  1) Vancomycin 750mg  IV after each dialysis 2) Weekly pre-HD level while on vancomycin  Margo Aye, Maybelle Depaoli A 07/09/2011,11:08 AM

## 2011-07-09 NOTE — Progress Notes (Signed)
*  PRELIMINARY RESULTS* Echocardiogram 2D Echocardiogram has been performed.  Caswell Corwin 07/09/2011, 11:53 AM

## 2011-07-10 ENCOUNTER — Inpatient Hospital Stay (HOSPITAL_COMMUNITY): Payer: Medicaid Other

## 2011-07-10 DIAGNOSIS — A4102 Sepsis due to Methicillin resistant Staphylococcus aureus: Secondary | ICD-10-CM

## 2011-07-10 DIAGNOSIS — N186 End stage renal disease: Secondary | ICD-10-CM

## 2011-07-10 DIAGNOSIS — R109 Unspecified abdominal pain: Secondary | ICD-10-CM

## 2011-07-10 LAB — GLUCOSE, CAPILLARY
Glucose-Capillary: 317 mg/dL — ABNORMAL HIGH (ref 70–99)
Glucose-Capillary: 436 mg/dL — ABNORMAL HIGH (ref 70–99)
Glucose-Capillary: 82 mg/dL (ref 70–99)
Glucose-Capillary: 222 mg/dL — ABNORMAL HIGH (ref 70–99)

## 2011-07-10 LAB — BASIC METABOLIC PANEL
BUN: 38 mg/dL — ABNORMAL HIGH (ref 6–23)
CO2: 23 mEq/L (ref 19–32)
Chloride: 93 mEq/L — ABNORMAL LOW (ref 96–112)
Creatinine, Ser: 7.39 mg/dL — ABNORMAL HIGH (ref 0.50–1.10)
Potassium: 4.7 mEq/L (ref 3.5–5.1)

## 2011-07-10 LAB — CULTURE, BLOOD (ROUTINE X 2): Culture: NO GROWTH

## 2011-07-10 MED FILL — Heparin Sodium (Porcine) Inj 1000 Unit/ML: INTRAMUSCULAR | Qty: 10 | Status: AC

## 2011-07-10 NOTE — Procedures (Signed)
Pt had new permacath placed yesterday. Radiology report states no complications.

## 2011-07-10 NOTE — Progress Notes (Signed)
UR Chart Review Completed  

## 2011-07-10 NOTE — Progress Notes (Signed)
Discharge instructions given to pt. With understanding verbalized. Pt. Taken to car via w/c. 

## 2011-07-10 NOTE — Progress Notes (Signed)
Pt. Clotted system with 15 minutes remaining in tx. Dr, Wolfgang Phoenix notified. Tx ended.

## 2011-07-10 NOTE — Progress Notes (Addendum)
[name]  MRN: [id number]  DOB/AGE: 1985-12-09 26 y.o.  Primary Care Physician:HASANAJ,XAJE A, MD, MD  Admit date: 06/28/2011  Chief Complaint:  Chief Complaint  Patient presents with  . Emesis  . Abdominal Pain    S-Pt presented on  06/28/2011 with  Chief Complaint  Patient presents with  . Emesis  . Abdominal Pain  .    Pt today feels tired. Pt seen on HD-Pt tolerating tx well.   Meds    . dextrose  25 g Intravenous Once  . epoetin alfa  12,000 Units Intravenous 3 times weekly  . insulin aspart  0-5 Units Subcutaneous QHS  . insulin aspart  0-9 Units Subcutaneous TID WC  . insulin aspart  10 Units Intravenous Once  . labetalol  300 mg Oral TID  . levETIRAcetam  500 mg Oral BID  . metoCLOPramide (REGLAN) injection  10 mg Intravenous Q8H  . senna-docusate  1 tablet Oral BID  . sodium chloride  10-40 mL Intracatheter Q12H  . sodium chloride  3 mL Intravenous Q12H  . vancomycin  750 mg Intravenous Q M,W,F-HD  . vancomycin  1,000 mg Intravenous Once          Physical Exam: Vital signs in last 24 hours: Temp:  [97 F (36.1 C)-98.3 F (36.8 C)] 97 F (36.1 C) (06/05 0935) Pulse Rate:  [98-109] 104  (06/05 1135) Resp:  [14-19] 18  (06/05 1135) BP: (114-155)/(76-107) 114/76 mmHg (06/05 1135) SpO2:  [98 %-100 %] 98 % (06/05 0935) Weight change:  Last BM Date: 07/08/11    Physical Exam: General- pt is awake,alert, oriented to time place and person Resp- No acute REsp distress, CTA B/L NO Rhonchi CVS- S1S2 regular in rate and rhythm GIT- BS+, soft, NT, ND EXT- NO LE Edema, Cyanosis Access- left IJ PC  Lab Results: CBC  Basename 07/08/11 1130  WBC 6.9  HGB 8.3*  HCT 25.0*  PLT 384    BMET  Basename 07/10/11 0940 07/09/11 0822  NA 129* 131*  K 4.7 4.5  CL 93* 93*  CO2 23 25  GLUCOSE 383* 217*  BUN 38* 28*  CREATININE 7.39* 5.98*  CALCIUM 9.0 9.7    MICRO Recent Results (from the past 240 hour(s))  MRSA PCR SCREENING      Status: Abnormal   Collection Time   07/02/11  9:09 AM      Component Value Range Status Comment   MRSA by PCR POSITIVE (*) NEGATIVE  Final   SURGICAL PCR SCREEN     Status: Abnormal   Collection Time   07/03/11  6:26 PM      Component Value Range Status Comment   MRSA, PCR POSITIVE (*) NEGATIVE  Final    Staphylococcus aureus POSITIVE (*) NEGATIVE  Final   CATH TIP CULTURE     Status: Normal   Collection Time   07/04/11  8:45 AM      Component Value Range Status Comment   Specimen Description CATH TIP PORTA CATH   Final    Special Requests VANCOMYCIN   Final    Culture NO GROWTH 2 DAYS   Final    Report Status 07/06/2011 FINAL   Final   CATH TIP CULTURE     Status: Normal   Collection Time   07/04/11  8:45 AM      Component Value Range Status Comment   Specimen Description CATH TIP HEMODIALYSIS CATHETER   Final    Special Requests NONE   Final  Culture     Final    Value: 15 COLONIES METHICILLIN RESISTANT STAPHYLOCOCCUS AUREUS     Note: RIFAMPIN AND GENTAMICIN SHOULD NOT BE USED AS SINGLE DRUGS FOR TREATMENT OF STAPH INFECTIONS. This organism DOES NOT demonstrate inducible Clindamycin resistance in vitro. CRITICAL RESULT CALLED TO, READ BACK BY AND VERIFIED WITH: CATHY B@1022  ON      [id number] BY [facility]   Report Status 07/07/2011 FINAL   Final    Organism ID, Bacteria METHICILLIN RESISTANT STAPHYLOCOCCUS AUREUS   Final   CULTURE, BLOOD (ROUTINE X 2)     Status: Normal (Preliminary result)   Collection Time   07/05/11  6:00 PM      Component Value Range Status Comment   Specimen Description BLOOD PORTA CATH DRAWN BY RN   Final    Special Requests BOTTLES DRAWN AEROBIC AND ANAEROBIC 5CC   Final    Culture NO GROWTH 4 DAYS   Final    Report Status PENDING   Incomplete   CULTURE, BLOOD (ROUTINE X 2)     Status: Normal (Preliminary result)   Collection Time   07/05/11  6:05 PM      Component Value Range Status Comment   Specimen Description BLOOD PORTA CATH DRAWN BY RN   Final     Special Requests BOTTLES DRAWN AEROBIC AND ANAEROBIC 10CC   Final    Culture NO GROWTH 4 DAYS   Final    Report Status PENDING   Incomplete       Lab Results  Component Value Date   PTH 256.0* 07/04/2011   CALCIUM 9.0 07/10/2011   CAION 1.16 06/04/2010   PHOS 7.0* 07/08/2011            Impression: 1)Renal  ESRD on HD  Pt on MWF schedule  Pt last dialyzed on MOnday Pt is currently being  Dialyzed.   2)HTN  Target Organ damage  CKD  Hypertrophic Cardiomyopathy  Medication-  On Alpha and beta Blockers   3)Anemia HGb at goal (9--11)  On Epo   4)CKD Mineral-Bone Disorder  PTH acceptable  Secondary Hyperparathyroidism present  Phosphorus NOT at goal.     5)ID- MRSA Bactremia - on Iv vanco  Permanent Catheter and Mediport Removed  Pt later had Temporary Cath placed  Pt Repeat Cultures Negative  Long term tunneled cath placed yesterday Will need 2-3 weeks of IV antibiotic therapy( started on vanco 5/26)will need till June 16.   6)FEN  Normokalemic  Hyponatremic-secondary to ESRD   7)Acid base  Co2 at goal     Plan:  Agree with current tx and plan Pt will need Vanco as outpt till- June 16-750mg  after HD - will alert the outpt dialysis  Unit.     [name] S 07/10/2011, 11:41 AM

## 2011-07-10 NOTE — Progress Notes (Signed)
Left femoral vein dialysis catheter removed without difficulty. Pressure held. Dressing applied. Is cleared for discharge from my standpoint after dialysis.

## 2011-07-11 ENCOUNTER — Other Ambulatory Visit (HOSPITAL_COMMUNITY): Payer: Self-pay | Admitting: Nephrology

## 2011-07-11 DIAGNOSIS — N186 End stage renal disease: Secondary | ICD-10-CM

## 2011-07-11 LAB — GLUCOSE, CAPILLARY
Glucose-Capillary: 359 mg/dL — ABNORMAL HIGH (ref 70–99)
Glucose-Capillary: 290 mg/dL — ABNORMAL HIGH (ref 70–99)
Glucose-Capillary: 340 mg/dL — ABNORMAL HIGH (ref 70–99)

## 2011-08-01 DIAGNOSIS — R55 Syncope and collapse: Secondary | ICD-10-CM

## 2011-08-26 DIAGNOSIS — I10 Essential (primary) hypertension: Secondary | ICD-10-CM

## 2011-08-30 DIAGNOSIS — R000 Tachycardia, unspecified: Secondary | ICD-10-CM

## 2011-08-30 DIAGNOSIS — I10 Essential (primary) hypertension: Secondary | ICD-10-CM

## 2011-09-03 ENCOUNTER — Ambulatory Visit: Payer: Self-pay | Admitting: Vascular Surgery

## 2011-09-03 LAB — CBC
HCT: 31.6 % — ABNORMAL LOW (ref 35.0–47.0)
MCH: 30.6 pg (ref 26.0–34.0)
MCV: 92 fL (ref 80–100)
Platelet: 264 10*3/uL (ref 150–440)
RBC: 3.42 10*6/uL — ABNORMAL LOW (ref 3.80–5.20)

## 2011-09-03 LAB — BASIC METABOLIC PANEL
Anion Gap: 11 (ref 7–16)
BUN: 28 mg/dL — ABNORMAL HIGH (ref 7–18)
Calcium, Total: 9.3 mg/dL (ref 8.5–10.1)
Chloride: 100 mmol/L (ref 98–107)
Co2: 28 mmol/L (ref 21–32)
Glucose: 265 mg/dL — ABNORMAL HIGH (ref 65–99)
Osmolality: 292 (ref 275–301)

## 2011-09-19 DIAGNOSIS — M6281 Muscle weakness (generalized): Secondary | ICD-10-CM

## 2011-09-27 ENCOUNTER — Ambulatory Visit: Payer: Self-pay | Admitting: Vascular Surgery

## 2011-10-20 ENCOUNTER — Inpatient Hospital Stay (HOSPITAL_COMMUNITY)
Admission: EM | Admit: 2011-10-20 | Discharge: 2011-10-30 | DRG: 871 | Disposition: A | Discharge status: Home Health | Payer: Medicaid Other | Attending: Internal Medicine | Admitting: Internal Medicine

## 2011-10-20 ENCOUNTER — Encounter (HOSPITAL_COMMUNITY): Payer: Self-pay | Admitting: *Deleted

## 2011-10-20 DIAGNOSIS — Z9119 Patient's noncompliance with other medical treatment and regimen: Secondary | ICD-10-CM

## 2011-10-20 DIAGNOSIS — Z882 Allergy status to sulfonamides status: Secondary | ICD-10-CM

## 2011-10-20 DIAGNOSIS — I330 Acute and subacute infective endocarditis: Secondary | ICD-10-CM | POA: Diagnosis present

## 2011-10-20 DIAGNOSIS — Z2239 Carrier of other specified bacterial diseases: Secondary | ICD-10-CM

## 2011-10-20 DIAGNOSIS — H3320 Serous retinal detachment, unspecified eye: Secondary | ICD-10-CM | POA: Diagnosis present

## 2011-10-20 DIAGNOSIS — Z22338 Carrier of other streptococcus: Secondary | ICD-10-CM

## 2011-10-20 DIAGNOSIS — Z881 Allergy status to other antibiotic agents status: Secondary | ICD-10-CM

## 2011-10-20 DIAGNOSIS — T80212A Local infection due to central venous catheter, initial encounter: Secondary | ICD-10-CM | POA: Diagnosis present

## 2011-10-20 DIAGNOSIS — Z86718 Personal history of other venous thrombosis and embolism: Secondary | ICD-10-CM

## 2011-10-20 DIAGNOSIS — M79605 Pain in left leg: Secondary | ICD-10-CM | POA: Diagnosis present

## 2011-10-20 DIAGNOSIS — N189 Chronic kidney disease, unspecified: Secondary | ICD-10-CM | POA: Diagnosis present

## 2011-10-20 DIAGNOSIS — R509 Fever, unspecified: Secondary | ICD-10-CM

## 2011-10-20 DIAGNOSIS — N2581 Secondary hyperparathyroidism of renal origin: Secondary | ICD-10-CM | POA: Diagnosis present

## 2011-10-20 DIAGNOSIS — E11319 Type 2 diabetes mellitus with unspecified diabetic retinopathy without macular edema: Secondary | ICD-10-CM | POA: Diagnosis present

## 2011-10-20 DIAGNOSIS — A419 Sepsis, unspecified organism: Secondary | ICD-10-CM

## 2011-10-20 DIAGNOSIS — E1142 Type 2 diabetes mellitus with diabetic polyneuropathy: Secondary | ICD-10-CM | POA: Diagnosis present

## 2011-10-20 DIAGNOSIS — N390 Urinary tract infection, site not specified: Secondary | ICD-10-CM

## 2011-10-20 DIAGNOSIS — G894 Chronic pain syndrome: Secondary | ICD-10-CM | POA: Diagnosis present

## 2011-10-20 DIAGNOSIS — Z1639 Resistance to other specified antimicrobial drug: Secondary | ICD-10-CM | POA: Diagnosis present

## 2011-10-20 DIAGNOSIS — E1039 Type 1 diabetes mellitus with other diabetic ophthalmic complication: Secondary | ICD-10-CM | POA: Diagnosis present

## 2011-10-20 DIAGNOSIS — E11649 Type 2 diabetes mellitus with hypoglycemia without coma: Secondary | ICD-10-CM

## 2011-10-20 DIAGNOSIS — Z79899 Other long term (current) drug therapy: Secondary | ICD-10-CM

## 2011-10-20 DIAGNOSIS — Z992 Dependence on renal dialysis: Secondary | ICD-10-CM

## 2011-10-20 DIAGNOSIS — E785 Hyperlipidemia, unspecified: Secondary | ICD-10-CM | POA: Diagnosis present

## 2011-10-20 DIAGNOSIS — N10 Acute tubulo-interstitial nephritis: Secondary | ICD-10-CM

## 2011-10-20 DIAGNOSIS — I498 Other specified cardiac arrhythmias: Secondary | ICD-10-CM | POA: Diagnosis present

## 2011-10-20 DIAGNOSIS — E871 Hypo-osmolality and hyponatremia: Secondary | ICD-10-CM | POA: Diagnosis present

## 2011-10-20 DIAGNOSIS — F319 Bipolar disorder, unspecified: Secondary | ICD-10-CM | POA: Diagnosis present

## 2011-10-20 DIAGNOSIS — B952 Enterococcus as the cause of diseases classified elsewhere: Secondary | ICD-10-CM | POA: Diagnosis present

## 2011-10-20 DIAGNOSIS — Z888 Allergy status to other drugs, medicaments and biological substances status: Secondary | ICD-10-CM

## 2011-10-20 DIAGNOSIS — Z22322 Carrier or suspected carrier of Methicillin resistant Staphylococcus aureus: Secondary | ICD-10-CM

## 2011-10-20 DIAGNOSIS — D631 Anemia in chronic kidney disease: Secondary | ICD-10-CM | POA: Diagnosis present

## 2011-10-20 DIAGNOSIS — IMO0002 Reserved for concepts with insufficient information to code with codable children: Secondary | ICD-10-CM | POA: Diagnosis present

## 2011-10-20 DIAGNOSIS — E1069 Type 1 diabetes mellitus with other specified complication: Secondary | ICD-10-CM | POA: Diagnosis present

## 2011-10-20 DIAGNOSIS — E1049 Type 1 diabetes mellitus with other diabetic neurological complication: Secondary | ICD-10-CM | POA: Diagnosis present

## 2011-10-20 DIAGNOSIS — I120 Hypertensive chronic kidney disease with stage 5 chronic kidney disease or end stage renal disease: Secondary | ICD-10-CM | POA: Diagnosis present

## 2011-10-20 DIAGNOSIS — N186 End stage renal disease: Secondary | ICD-10-CM

## 2011-10-20 DIAGNOSIS — Z885 Allergy status to narcotic agent status: Secondary | ICD-10-CM

## 2011-10-20 DIAGNOSIS — R7881 Bacteremia: Secondary | ICD-10-CM | POA: Diagnosis present

## 2011-10-20 DIAGNOSIS — H547 Unspecified visual loss: Secondary | ICD-10-CM

## 2011-10-20 DIAGNOSIS — I38 Endocarditis, valve unspecified: Secondary | ICD-10-CM

## 2011-10-20 DIAGNOSIS — R000 Tachycardia, unspecified: Secondary | ICD-10-CM

## 2011-10-20 DIAGNOSIS — I10 Essential (primary) hypertension: Secondary | ICD-10-CM | POA: Diagnosis present

## 2011-10-20 DIAGNOSIS — Z91018 Allergy to other foods: Secondary | ICD-10-CM

## 2011-10-20 DIAGNOSIS — K529 Noninfective gastroenteritis and colitis, unspecified: Secondary | ICD-10-CM

## 2011-10-20 DIAGNOSIS — Z7901 Long term (current) use of anticoagulants: Secondary | ICD-10-CM

## 2011-10-20 DIAGNOSIS — M79604 Pain in right leg: Secondary | ICD-10-CM

## 2011-10-20 DIAGNOSIS — B9561 Methicillin susceptible Staphylococcus aureus infection as the cause of diseases classified elsewhere: Secondary | ICD-10-CM

## 2011-10-20 DIAGNOSIS — E1121 Type 2 diabetes mellitus with diabetic nephropathy: Secondary | ICD-10-CM

## 2011-10-20 DIAGNOSIS — N039 Chronic nephritic syndrome with unspecified morphologic changes: Secondary | ICD-10-CM | POA: Diagnosis present

## 2011-10-20 DIAGNOSIS — K319 Disease of stomach and duodenum, unspecified: Secondary | ICD-10-CM | POA: Diagnosis present

## 2011-10-20 DIAGNOSIS — H543 Unqualified visual loss, both eyes: Secondary | ICD-10-CM | POA: Diagnosis present

## 2011-10-20 DIAGNOSIS — E875 Hyperkalemia: Secondary | ICD-10-CM

## 2011-10-20 DIAGNOSIS — T82898A Other specified complication of vascular prosthetic devices, implants and grafts, initial encounter: Secondary | ICD-10-CM

## 2011-10-20 DIAGNOSIS — I422 Other hypertrophic cardiomyopathy: Secondary | ICD-10-CM | POA: Diagnosis present

## 2011-10-20 DIAGNOSIS — Z91199 Patient's noncompliance with other medical treatment and regimen due to unspecified reason: Secondary | ICD-10-CM

## 2011-10-20 DIAGNOSIS — R109 Unspecified abdominal pain: Secondary | ICD-10-CM

## 2011-10-20 DIAGNOSIS — A4102 Sepsis due to Methicillin resistant Staphylococcus aureus: Principal | ICD-10-CM | POA: Diagnosis present

## 2011-10-20 DIAGNOSIS — R112 Nausea with vomiting, unspecified: Secondary | ICD-10-CM

## 2011-10-20 DIAGNOSIS — E1065 Type 1 diabetes mellitus with hyperglycemia: Secondary | ICD-10-CM | POA: Diagnosis present

## 2011-10-20 DIAGNOSIS — E1029 Type 1 diabetes mellitus with other diabetic kidney complication: Secondary | ICD-10-CM

## 2011-10-20 DIAGNOSIS — Y69 Unspecified misadventure during surgical and medical care: Secondary | ICD-10-CM | POA: Diagnosis present

## 2011-10-20 DIAGNOSIS — Z794 Long term (current) use of insulin: Secondary | ICD-10-CM

## 2011-10-20 HISTORY — DX: Endocarditis, valve unspecified: I38

## 2011-10-20 HISTORY — DX: Anemia in chronic kidney disease: D63.1

## 2011-10-20 HISTORY — DX: Chronic kidney disease, unspecified: N18.9

## 2011-10-20 HISTORY — DX: Patient's noncompliance with other medical treatment and regimen due to unspecified reason: Z91.199

## 2011-10-20 HISTORY — DX: Patient's noncompliance with other medical treatment and regimen: Z91.19

## 2011-10-20 HISTORY — DX: Acute pyelonephritis: N10

## 2011-10-20 MED ORDER — SODIUM CHLORIDE 0.9 % IV BOLUS (SEPSIS)
1000.0000 mL | Freq: Once | INTRAVENOUS | Status: AC
  Administered 2011-10-21: 1000 mL via INTRAVENOUS

## 2011-10-21 ENCOUNTER — Inpatient Hospital Stay (HOSPITAL_COMMUNITY): Payer: Medicaid Other

## 2011-10-21 ENCOUNTER — Emergency Department (HOSPITAL_COMMUNITY): Payer: Medicaid Other

## 2011-10-21 ENCOUNTER — Encounter (HOSPITAL_COMMUNITY): Payer: Self-pay | Admitting: *Deleted

## 2011-10-21 DIAGNOSIS — A419 Sepsis, unspecified organism: Secondary | ICD-10-CM | POA: Diagnosis present

## 2011-10-21 DIAGNOSIS — E875 Hyperkalemia: Secondary | ICD-10-CM

## 2011-10-21 DIAGNOSIS — R112 Nausea with vomiting, unspecified: Secondary | ICD-10-CM

## 2011-10-21 DIAGNOSIS — R109 Unspecified abdominal pain: Secondary | ICD-10-CM

## 2011-10-21 DIAGNOSIS — B952 Enterococcus as the cause of diseases classified elsewhere: Secondary | ICD-10-CM | POA: Diagnosis present

## 2011-10-21 DIAGNOSIS — R509 Fever, unspecified: Secondary | ICD-10-CM | POA: Diagnosis present

## 2011-10-21 LAB — MRSA PCR SCREENING: MRSA by PCR: NEGATIVE

## 2011-10-21 LAB — RENAL FUNCTION PANEL
BUN: 51 mg/dL — ABNORMAL HIGH (ref 6–23)
CO2: 23 mEq/L (ref 19–32)
GFR calc Af Amer: 7 mL/min — ABNORMAL LOW (ref >90)
Glucose, Bld: 150 mg/dL — ABNORMAL HIGH (ref 70–99)
Potassium: 4.9 mEq/L (ref 3.5–5.1)
Sodium: 133 mEq/L — ABNORMAL LOW (ref 135–145)

## 2011-10-21 LAB — COMPREHENSIVE METABOLIC PANEL
ALT: 25 U/L (ref 0–35)
AST: 44 U/L — ABNORMAL HIGH (ref 0–37)
Albumin: 3.8 g/dL (ref 3.5–5.2)
Alkaline Phosphatase: 164 U/L — ABNORMAL HIGH (ref 39–117)
Calcium: 10.2 mg/dL (ref 8.4–10.5)
GFR calc Af Amer: 6 mL/min — ABNORMAL LOW (ref >90)
Potassium: 6.1 mEq/L — ABNORMAL HIGH (ref 3.5–5.1)
Sodium: 134 mEq/L — ABNORMAL LOW (ref 135–145)
Total Protein: 8.6 g/dL — ABNORMAL HIGH (ref 6.0–8.3)

## 2011-10-21 LAB — CBC
Hemoglobin: 13.9 g/dL (ref 12.0–15.0)
MCH: 30.9 pg (ref 26.0–34.0)
MCHC: 32.6 g/dL (ref 30.0–36.0)
Platelets: 187 10*3/uL (ref 150–400)
RDW: 16 % — ABNORMAL HIGH (ref 11.5–15.5)

## 2011-10-21 LAB — URINALYSIS, ROUTINE W REFLEX MICROSCOPIC
Bilirubin Urine: NEGATIVE
Ketones, ur: NEGATIVE mg/dL
Leukocytes, UA: NEGATIVE
Nitrite: NEGATIVE
Protein, ur: >300 mg/dL — AB
Urobilinogen, UA: 0.2 mg/dL (ref 0.0–1.0)
pH: 7.5 (ref 5.0–8.0)

## 2011-10-21 LAB — GLUCOSE, CAPILLARY
Glucose-Capillary: 115 mg/dL — ABNORMAL HIGH (ref 70–99)
Glucose-Capillary: 126 mg/dL — ABNORMAL HIGH (ref 70–99)

## 2011-10-21 LAB — URINE MICROSCOPIC-ADD ON

## 2011-10-21 MED ORDER — SODIUM CHLORIDE 0.9 % IJ SOLN
3.0000 mL | Freq: Two times a day (BID) | INTRAMUSCULAR | Status: DC
  Administered 2011-10-21 – 2011-10-28 (×6): 3 mL via INTRAVENOUS
  Filled 2011-10-21 (×3): qty 3

## 2011-10-21 MED ORDER — DEXTROSE 50 % IV SOLN: 50.0000 mL | Freq: Once | INTRAVENOUS | Status: DC

## 2011-10-21 MED ORDER — SODIUM CHLORIDE 0.9 % IV SOLN: 250.0000 mL | INTRAVENOUS | Status: DC | PRN

## 2011-10-21 MED ORDER — HYDROMORPHONE HCL PF 1 MG/ML IJ SOLN
1.0000 mg | INTRAMUSCULAR | Status: DC | PRN
  Administered 2011-10-21: 1 mg via INTRAVENOUS
  Filled 2011-10-21: qty 1

## 2011-10-21 MED ORDER — VANCOMYCIN HCL IN DEXTROSE 1-5 GM/200ML-% IV SOLN
INTRAVENOUS | Status: AC
  Filled 2011-10-21: qty 200

## 2011-10-21 MED ORDER — HYDROMORPHONE HCL PF 1 MG/ML IJ SOLN
1.0000 mg | INTRAMUSCULAR | Status: DC | PRN
  Administered 2011-10-21 – 2011-10-24 (×16): 1 mg via INTRAVENOUS
  Filled 2011-10-21 (×17): qty 1

## 2011-10-21 MED ORDER — LABETALOL HCL 200 MG PO TABS: 300.0000 mg | ORAL_TABLET | Freq: Three times a day (TID) | ORAL | Status: DC

## 2011-10-21 MED ORDER — DEXTROSE 5 % IV SOLN
INTRAVENOUS | Status: AC
  Filled 2011-10-21: qty 10

## 2011-10-21 MED ORDER — INSULIN ASPART 100 UNIT/ML ~~LOC~~ SOLN
0.0000 [IU] | Freq: Every day | SUBCUTANEOUS | Status: DC
  Administered 2011-10-23: 5 [IU] via SUBCUTANEOUS
  Administered 2011-10-24: 2 [IU] via SUBCUTANEOUS
  Administered 2011-10-25: 4 [IU] via SUBCUTANEOUS
  Administered 2011-10-26 – 2011-10-27 (×2): 2 [IU] via SUBCUTANEOUS
  Administered 2011-10-28: 3 [IU] via SUBCUTANEOUS

## 2011-10-21 MED ORDER — DEXTROSE 50 % IV SOLN
1.0000 | Freq: Once | INTRAVENOUS | Status: AC
  Administered 2011-10-21: 50 mL via INTRAVENOUS

## 2011-10-21 MED ORDER — DEXTROSE 50 % IV SOLN
INTRAVENOUS | Status: AC
  Filled 2011-10-21: qty 50

## 2011-10-21 MED ORDER — CIPROFLOXACIN IN D5W 400 MG/200ML IV SOLN
400.0000 mg | Freq: Two times a day (BID) | INTRAVENOUS | Status: DC
  Filled 2011-10-21: qty 200

## 2011-10-21 MED ORDER — HEPARIN SODIUM (PORCINE) 1000 UNIT/ML DIALYSIS
300.0000 [IU] | INTRAMUSCULAR | Status: DC | PRN
  Administered 2011-10-23 – 2011-10-28 (×2): 300 [IU] via INTRAVENOUS_CENTRAL
  Filled 2011-10-21: qty 1

## 2011-10-21 MED ORDER — ONDANSETRON HCL 4 MG PO TABS
4.0000 mg | ORAL_TABLET | Freq: Four times a day (QID) | ORAL | Status: DC | PRN
  Administered 2011-10-22: 4 mg via ORAL
  Filled 2011-10-21: qty 1

## 2011-10-21 MED ORDER — CIPROFLOXACIN IN D5W 400 MG/200ML IV SOLN
400.0000 mg | INTRAVENOUS | Status: DC
  Administered 2011-10-22 – 2011-10-25 (×4): 400 mg via INTRAVENOUS
  Filled 2011-10-21 (×7): qty 200

## 2011-10-21 MED ORDER — HEPARIN SODIUM (PORCINE) 1000 UNIT/ML DIALYSIS
20.0000 [IU]/kg | INTRAMUSCULAR | Status: DC | PRN
  Administered 2011-10-21 – 2011-10-30 (×5): 1400 [IU] via INTRAVENOUS_CENTRAL
  Filled 2011-10-21: qty 2

## 2011-10-21 MED ORDER — INSULIN ASPART 100 UNIT/ML ~~LOC~~ SOLN
0.0000 [IU] | Freq: Three times a day (TID) | SUBCUTANEOUS | Status: DC
  Administered 2011-10-21: 1 [IU] via SUBCUTANEOUS
  Administered 2011-10-22 – 2011-10-23 (×3): 2 [IU] via SUBCUTANEOUS
  Administered 2011-10-24: 5 [IU] via SUBCUTANEOUS
  Administered 2011-10-24: 2 [IU] via SUBCUTANEOUS
  Administered 2011-10-24: 3 [IU] via SUBCUTANEOUS
  Administered 2011-10-25 (×2): 2 [IU] via SUBCUTANEOUS
  Administered 2011-10-26 (×2): 1 [IU] via SUBCUTANEOUS
  Administered 2011-10-27: 3 [IU] via SUBCUTANEOUS
  Administered 2011-10-27 – 2011-10-28 (×2): 2 [IU] via SUBCUTANEOUS
  Administered 2011-10-30: 3 [IU] via SUBCUTANEOUS
  Administered 2011-10-30: 2 [IU] via SUBCUTANEOUS

## 2011-10-21 MED ORDER — DEXTROSE 5 % IV SOLN: 1.0000 g | INTRAVENOUS | Status: DC

## 2011-10-21 MED ORDER — FLUOXETINE HCL 20 MG PO CAPS
20.0000 mg | ORAL_CAPSULE | Freq: Every day | ORAL | Status: DC
  Administered 2011-10-21 – 2011-10-30 (×10): 20 mg via ORAL
  Filled 2011-10-21 (×10): qty 1

## 2011-10-21 MED ORDER — METOCLOPRAMIDE HCL 10 MG PO TABS: 10.0000 mg | ORAL_TABLET | Freq: Four times a day (QID) | ORAL | Status: DC | PRN

## 2011-10-21 MED ORDER — RENA-VITE PO TABS
1.0000 | ORAL_TABLET | Freq: Every day | ORAL | Status: DC
  Administered 2011-10-21 – 2011-10-30 (×9): 1 via ORAL
  Filled 2011-10-21 (×21): qty 1

## 2011-10-21 MED ORDER — LABETALOL HCL 200 MG PO TABS
300.0000 mg | ORAL_TABLET | Freq: Three times a day (TID) | ORAL | Status: DC
  Administered 2011-10-21: 300 mg via ORAL
  Administered 2011-10-21: 22:00:00 via ORAL
  Administered 2011-10-22 – 2011-10-29 (×19): 300 mg via ORAL
  Filled 2011-10-21 (×2): qty 2
  Filled 2011-10-21: qty 1
  Filled 2011-10-21 (×2): qty 2
  Filled 2011-10-21: qty 1
  Filled 2011-10-21 (×4): qty 2
  Filled 2011-10-21: qty 1
  Filled 2011-10-21 (×16): qty 2

## 2011-10-21 MED ORDER — VANCOMYCIN HCL IN DEXTROSE 1-5 GM/200ML-% IV SOLN
1000.0000 mg | INTRAVENOUS | Status: DC
  Administered 2011-10-21 – 2011-10-25 (×3): 1000 mg via INTRAVENOUS
  Filled 2011-10-21 (×4): qty 200

## 2011-10-21 MED ORDER — SEVELAMER CARBONATE 800 MG PO TABS
800.0000 mg | ORAL_TABLET | Freq: Three times a day (TID) | ORAL | Status: DC
  Administered 2011-10-21 – 2011-10-30 (×25): 800 mg via ORAL
  Filled 2011-10-21 (×27): qty 1

## 2011-10-21 MED ORDER — SODIUM CHLORIDE 0.9 % IJ SOLN: 3.0000 mL | INTRAMUSCULAR | Status: DC | PRN

## 2011-10-21 MED ORDER — SODIUM CHLORIDE 0.9 % IV SOLN
250.0000 mL | INTRAVENOUS | Status: DC | PRN
  Administered 2011-10-22: 250 mL via INTRAVENOUS

## 2011-10-21 MED ORDER — CIPROFLOXACIN IN D5W 400 MG/200ML IV SOLN
400.0000 mg | Freq: Once | INTRAVENOUS | Status: AC
  Administered 2011-10-21: 400 mg via INTRAVENOUS
  Filled 2011-10-21: qty 200

## 2011-10-21 MED ORDER — ACETAMINOPHEN 325 MG PO TABS
650.0000 mg | ORAL_TABLET | Freq: Once | ORAL | Status: AC
  Administered 2011-10-21: 650 mg via ORAL
  Filled 2011-10-21: qty 2

## 2011-10-21 MED ORDER — ONDANSETRON HCL 4 MG/2ML IJ SOLN
4.0000 mg | Freq: Four times a day (QID) | INTRAMUSCULAR | Status: DC | PRN
  Administered 2011-10-21: 4 mg via INTRAVENOUS
  Filled 2011-10-21: qty 2

## 2011-10-21 MED ORDER — INSULIN ASPART 100 UNIT/ML ~~LOC~~ SOLN
10.0000 [IU] | Freq: Once | SUBCUTANEOUS | Status: AC
  Administered 2011-10-21: 10 [IU] via SUBCUTANEOUS

## 2011-10-21 MED ORDER — INSULIN REGULAR HUMAN 100 UNIT/ML IJ SOLN
10.0000 [IU] | Freq: Once | INTRAMUSCULAR | Status: DC
  Filled 2011-10-21: qty 0.1

## 2011-10-21 MED ORDER — VANCOMYCIN HCL IN DEXTROSE 1-5 GM/200ML-% IV SOLN
1000.0000 mg | Freq: Once | INTRAVENOUS | Status: DC
  Administered 2011-10-21: 1000 mg via INTRAVENOUS

## 2011-10-21 MED ORDER — PIPERACILLIN-TAZOBACTAM 3.375 G IVPB
INTRAVENOUS | Status: AC
  Filled 2011-10-21: qty 50

## 2011-10-21 MED ORDER — SODIUM POLYSTYRENE SULFONATE 15 GM/60ML PO SUSP
30.0000 g | Freq: Once | ORAL | Status: AC
  Administered 2011-10-21: 30 g via ORAL
  Filled 2011-10-21: qty 120

## 2011-10-21 NOTE — Consult Note (Signed)
Reason for Consult: End-stage renal disease and hyperkalemia Referring Physician: Dr. [name] is an 26 y.o. female.  HPI: Her she is a patient was history of diabetes, hypertension and end-stage renal disease on maintenance hemodialysis Tuesday Thursday Saturday presently and came with complaints of abdominal pain nausea vomiting of a couple of days duration. Patient has history of diabetic gastropathy and has some nausea vomiting from before. She has also history of chronic pain syndrome. Her last dialysis was on Saturday. Patient states that she is also some leg pain which comes and goes.  Past Medical History  Diagnosis Date  . Diabetes mellitus     insulin dependent diabetes mellitus with history of poor control  . Hypertension   . Hyperlipidemia   . Nephrotic syndrome   . Anemia     chronic,  Dr. Mariel [name],  Transfused, May, 2012  . 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 Hypertrophic cardiomyopathy     with diastolic heart dysfunction  . 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 Vaginosis   . History of MRSA infection     multiple  . Glaucoma   . Warfarin anticoagulation     DVT  . Ejection fraction     55%, echo 12/2009  . Manic depressive disorder   . Noncompliance   . port-a-cath   . Dialysis patient     M-W-F  . MRSA bacteremia   . Acute pyelonephritis 10/21/2011    Past Surgical History  Procedure Date  . Left arm cyst removal   . Incise and drain abcess 04/24/11    for multiple MRSA -       March 2013 drain port of abcess  . Placement of port a cath     poor venous access  . Av fistula placement   . Hematoma evacuation 05/25/2011    Procedure: EVACUATION HEMATOMA;  Surgeon: Samantha Kerns, MD;  Location: [facility] OR;  Service: Vascular;  Laterality: Right;  Evacuation Hematoma Right Arm  . Port-a-cath removal 07/04/2011    Procedure: MINOR REMOVAL PORT-A-CATH;  Surgeon: Samantha Heading, MD;   Location: [facility] ORS;  Service: General;  Laterality: Left;  Removal Port-A-Cath/Removal Dialysis Catheter in Minor Room    Family History  Problem Relation Age of Onset  . Diabetes Mother   . Hypertension Mother   . Diabetes Father     Social History:  reports that she has never smoked. She does not have any smokeless tobacco history on file. She reports that she does not drink alcohol or use illicit drugs.  Allergies:  Allergies  Allergen Reactions  . Ancef (Cefazolin Sodium) Shortness Of Breath    wheezing  . Coumadin (Warfarin Sodium)     Eyes bleed  . Morphine And Related Itching  . Sulfa Antibiotics Anaphylaxis and Shortness Of Breath  . Tomato Hives and Swelling    Medications: I have reviewed the patient'[name] current medications.  Results for orders placed during the hospital encounter of 10/20/11 (from the past 48 hour([name]))  CBC     Status: Abnormal   Collection Time   10/21/11 12:04 AM      Component Value Range Comment   WBC 5.5  4.0 - 10.5 K/uL    RBC 4.50  3.87 - 5.11 MIL/uL    Hemoglobin 13.9  12.0 - 15.0 g/dL    HCT 16.1  09.6 - 04.5 %  MCV 94.7  78.0 - 100.0 fL    MCH 30.9  26.0 - 34.0 pg    MCHC 32.6  30.0 - 36.0 g/dL    RDW 40.9 (*) 81.1 - 15.5 %    Platelets 187  150 - 400 K/uL   COMPREHENSIVE METABOLIC PANEL     Status: Abnormal   Collection Time   10/21/11 12:04 AM      Component Value Range Comment   Sodium 134 (*) 135 - 145 mEq/L    Potassium 6.1 (*) 3.5 - 5.1 mEq/L SPECIMEN SLIGHTLY HEMOLIZED   Chloride 90 (*) 96 - 112 mEq/L    CO2 23  19 - 32 mEq/L    Glucose, Bld 262 (*) 70 - 99 mg/dL    BUN 53 (*) 6 - 23 mg/dL    Creatinine, Ser 9.14 (*) 0.50 - 1.10 mg/dL    Calcium 78.2  8.4 - 10.5 mg/dL    Total Protein 8.6 (*) 6.0 - 8.3 g/dL    Albumin 3.8  3.5 - 5.2 g/dL    AST 44 (*) 0 - 37 U/L SPECIMEN SLIGHTLY HEMOLIZED   ALT 25  0 - 35 U/L    Alkaline Phosphatase 164 (*) 39 - 117 U/L    Total Bilirubin 0.5  0.3 - 1.2 mg/dL    GFR calc non Af Amer 6  (*) >90 mL/min    GFR calc Af Amer 6 (*) >90 mL/min   URINALYSIS, ROUTINE W REFLEX MICROSCOPIC     Status: Abnormal   Collection Time   10/21/11  3:05 AM      Component Value Range Comment   Color, Urine YELLOW  YELLOW    APPearance HAZY (*) CLEAR    Specific Gravity, Urine 1.020  1.005 - 1.030    pH 7.5  5.0 - 8.0    Glucose, UA 250 (*) NEGATIVE mg/dL    Hgb urine dipstick LARGE (*) NEGATIVE    Bilirubin Urine NEGATIVE  NEGATIVE    Ketones, ur NEGATIVE  NEGATIVE mg/dL    Protein, ur >956 (*) NEGATIVE mg/dL    Urobilinogen, UA 0.2  0.0 - 1.0 mg/dL    Nitrite NEGATIVE  NEGATIVE    Leukocytes, UA NEGATIVE  NEGATIVE   URINE MICROSCOPIC-ADD ON     Status: Abnormal   Collection Time   10/21/11  3:05 AM      Component Value Range Comment   Squamous Epithelial / LPF RARE  RARE    WBC, UA 11-20  <3 WBC/hpf    RBC / HPF TOO NUMEROUS TO COUNT  <3 RBC/hpf    Bacteria, UA MANY (*) RARE   CULTURE, BLOOD (ROUTINE X 2)     Status: Normal (Preliminary result)   Collection Time   10/21/11  4:27 AM      Component Value Range Comment   Specimen Description Blood BLOOD RIGHT HAND      Special Requests BOTTLES DRAWN AEROBIC ONLY 5CC      Culture PENDING      Report Status PENDING     PROCALCITONIN     Status: Normal   Collection Time   10/21/11  4:28 AM      Component Value Range Comment   Procalcitonin 17.70     LACTIC ACID, PLASMA     Status: Abnormal   Collection Time   10/21/11  4:28 AM      Component Value Range Comment   Lactic Acid, Venous 2.7 (*) 0.5 - 2.2 mmol/L  CULTURE, BLOOD (ROUTINE X 2)     Status: Normal (Preliminary result)   Collection Time   10/21/11  4:47 AM      Component Value Range Comment   Specimen Description Blood BLOOD RIGHT ARM      Special Requests BOTTLES DRAWN AEROBIC ONLY 5CC      Culture PENDING      Report Status PENDING     LIPASE, BLOOD     Status: Normal   Collection Time   10/21/11  7:23 AM      Component Value Range Comment   Lipase 13  11 - 59 U/L      Ct Abdomen Pelvis Wo Contrast  10/21/2011  *RADIOLOGY REPORT*  Clinical Data: Abdominal pain.  CT ABDOMEN AND PELVIS WITHOUT CONTRAST  Technique:  Multidetector CT imaging of the abdomen and pelvis was performed following the standard protocol without intravenous contrast.  Comparison: CT abdomen and pelvis 06/30/2011, 06/18/2011 and 06/13/2010.  Findings: There is some dependent atelectasis in the lung bases. No pleural or pericardial effusion.  The kidneys appear edematous bilaterally, worse on the left.  There is some stranding about both kidneys, also worse on the left. Mild fullness of the left renal collecting system is noted.  No urinary tract stones are identified.  No focal fluid collection is seen. The gallbladder is unremarkable.  The spleen, adrenal glands and pancreas appear normal.  No focal liver lesion is seen.  The uterus, adnexa and urinary bladder are unremarkable.  No lymphadenopathy is identified.  No focal bony abnormality.  IMPRESSION:  Edematous appearance of the kidneys bilaterally, worse on the left, with mild fullness of the left renal collecting system noted.  No obstructing stone is identified.  Question pyelonephritis. Findings discussed with Dr. Dierdre Highman at the time of interpretation.   Original Report Authenticated By: Bernadene Bell. Maricela Curet, M.D.    Dg Chest 2 View  10/21/2011  *RADIOLOGY REPORT*  Clinical Data: Weakness and abdominal pain.  CHEST - 2 VIEW  Comparison: Plain film of the chest 09/19/2011.  Findings: Left IJ approach dialysis catheter remains in place. Lungs are clear.  No edema, pneumothorax or pleural fluid.  Heart size normal.  IMPRESSION: No acute disease.   Original Report Authenticated By: Bernadene Bell. Maricela Curet, M.D.     Review of Systems  Cardiovascular: Negative for orthopnea and leg swelling.  Gastrointestinal: Positive for nausea, vomiting and abdominal pain.  Musculoskeletal: Positive for joint pain.  Neurological: Positive for weakness.   Blood  pressure 107/56, pulse 89, temperature 99.2 F (37.3 C), temperature source Oral, resp. rate 13, height 5\' 8"  (1.727 m), weight 69.1 kg (152 lb 5.4 oz), SpO2 98.00%. Physical Exam  Eyes: No scleral icterus.  Neck: No JVD present.  Cardiovascular: Normal rate and regular rhythm.   No murmur heard. Respiratory: She has no wheezes. She has no rales.  GI: She exhibits no distension. There is tenderness. There is no rebound.  Musculoskeletal: She exhibits no edema.    Assessment/Plan: Problem #1 end-stage renal disease she status post hemodialysis on Saturday. Her pending creatinine was in acceptable range. Problem #2 hyperkalemia this is her from noncompliance was her diet. Problem #3 history of diabetes poorly controlled Problem #4 hypertension poorly controlled as an outpatient because of noncompliance with the patient. When she comes to the hospital her blood pressure seems to be responding was 142 medications including Catapres patch. When she goes home however she comes with blood pressure systolic of up to 200. Problem #5 history  of diabetic gastropathy Problem #6 history of diabetic neuropathy Problem #7 history of diabetic retinopathy patient with very poor vision. Problem #8 possible pyelonephritis as this moment. Plan: We'll do dialysis today We'll use 2K 2.5 calcium bath. We'll try to get about 3-4 L if her blood pressure tolerates. We'll check her basic metabolic panel and phosphorus in the morning.   [name] 10/21/2011, 7:56 AM

## 2011-10-21 NOTE — Progress Notes (Signed)
ANTIBIOTIC CONSULT NOTE - INITIAL  Pharmacy Consult for Vancomycin and Cipro Indication: UTI  Allergies  Allergen Reactions  . Ancef (Cefazolin Sodium) Shortness Of Breath    wheezing  . Coumadin (Warfarin Sodium)     Eyes bleed  . Morphine And Related Itching  . Sulfa Antibiotics Anaphylaxis and Shortness Of Breath  . Tomato Hives and Swelling   Patient Measurements: Height: 5\' 8"  (172.7 cm) Weight: 152 lb 5.4 oz (69.1 kg) IBW/kg (Calculated) : 63.9   Vital Signs: Temp: 99.2 F (37.3 C) (09/16 0525) Temp src: Oral (09/16 0525) BP: 107/56 mmHg (09/16 0600) Pulse Rate: 89  (09/16 0556) Intake/Output from previous day: 09/15 0701 - 09/16 0700 In: 200 [IV Piggyback:200] Out: -  Intake/Output from this shift:    Labs:  Basename 10/21/11 0004  WBC 5.5  HGB 13.9  PLT 187  LABCREA --  CREATININE 8.79*   Estimated Creatinine Clearance: 9.9 ml/min (by C-G formula based on Cr of 8.79). No results found for this basename: VANCOTROUGH:2,VANCOPEAK:2,VANCORANDOM:2,GENTTROUGH:2,GENTPEAK:2,GENTRANDOM:2,TOBRATROUGH:2,TOBRAPEAK:2,TOBRARND:2,AMIKACINPEAK:2,AMIKACINTROU:2,AMIKACIN:2, in the last 72 hours   Microbiology: Recent Results (from the past 720 hour(s))  CULTURE, BLOOD (ROUTINE X 2)     Status: Normal (Preliminary result)   Collection Time   10/21/11  4:27 AM      Component Value Range Status Comment   Specimen Description Blood BLOOD RIGHT HAND   Final    Special Requests BOTTLES DRAWN AEROBIC ONLY 5CC   Final    Culture PENDING   Incomplete    Report Status PENDING   Incomplete   CULTURE, BLOOD (ROUTINE X 2)     Status: Normal (Preliminary result)   Collection Time   10/21/11  4:47 AM      Component Value Range Status Comment   Specimen Description Blood BLOOD RIGHT ARM   Final    Special Requests BOTTLES DRAWN AEROBIC ONLY 5CC   Final    Culture PENDING   Incomplete    Report Status PENDING   Incomplete    Medical History: Past Medical History  Diagnosis Date    . Diabetes mellitus     insulin dependent diabetes mellitus with history of poor control  . Hypertension   . Hyperlipidemia   . Nephrotic syndrome   . Anemia     chronic,  Dr. Mariel Sleet,  Transfused, May, 2012  . 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 Hypertrophic cardiomyopathy     with diastolic heart dysfunction  . 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 Vaginosis   . History of MRSA infection     multiple  . Glaucoma   . Warfarin anticoagulation     DVT  . Ejection fraction     55%, echo 12/2009  . Manic depressive disorder   . Noncompliance   . port-a-cath   . Dialysis patient     M-W-F  . MRSA bacteremia   . Acute pyelonephritis 10/21/2011   Medications:  Scheduled:    . acetaminophen  650 mg Oral Once  . ciprofloxacin  400 mg Intravenous Once  . ciprofloxacin  400 mg Intravenous Q24H  . dextrose  1 ampule Intravenous Once  . FLUoxetine  20 mg Oral Daily  . insulin aspart  0-5 Units Subcutaneous QHS  . insulin aspart  0-9 Units Subcutaneous TID WC  . insulin aspart  10 Units Subcutaneous Once  . labetalol  300 mg Oral TID  . multivitamin  1  tablet Oral Daily  . sevelamer  800 mg Oral TID WC  . sodium chloride  1,000 mL Intravenous Once  . sodium chloride  3 mL Intravenous Q12H  . sodium polystyrene  30 g Oral Once  . vancomycin  1,000 mg Intravenous Once  . DISCONTD: cefTRIAXone (ROCEPHIN)  IV  1 g Intravenous Q24H  . DISCONTD: ciprofloxacin  400 mg Intravenous Q12H  . DISCONTD: dextrose  50 mL Intravenous Once  . DISCONTD: insulin regular  10 Units Subcutaneous Once   Assessment: 26yo female with ESRD requiring dialysis admitted with infectious process.  Asked to manage Cipro and Vancomycin.  Received 1 dose of each ABX this am. Estimated Creatinine Clearance: 9.9 ml/min (by C-G formula based on Cr of 8.79).  Goal of Therapy:  Vancomycin trough level 10-15 mcg/ml  Plan: Cipro 400mg  iv q24hrs  (renally adjusted) Vancomycin 1gm if after each dialysis Check pre-dialysis level at steady state Monitor labs and cultures per protocol  Valrie Hart A 10/21/2011,7:48 AM

## 2011-10-21 NOTE — Plan of Care (Signed)
Problem: Consults Goal: General Medical Patient Education See Patient Education Module for specific education. Outcome: Progressing Admitted with nausea and vomiting, no vomitus since admit to floor, nausea only Goal: Skin Care Protocol Initiated - if indicated If consults are not indicated, leave blank or document N/A Outcome: Progressing Poor hygiene  Problem: Phase I Progression Outcomes Goal: Pain controlled with appropriate interventions Outcome: Progressing Pain management using dilaudid Goal: OOB as tolerated unless otherwise ordered Outcome: Progressing Patient can get up as tolerated Goal: Initial discharge plan identified Outcome: Progressing Home with significant other Goal: Voiding-avoid urinary catheter unless indicated Outcome: Progressing oliguric

## 2011-10-21 NOTE — ED Provider Notes (Signed)
History     CSN: [id number]  Arrival date & time 10/20/11  2320   First MD Initiated Contact with Patient 10/20/11 2350      Chief Complaint  Patient presents with  . Leg Pain  . Fever  . Nausea  . Emesis  . Abdominal Pain    (Consider location/radiation/quality/duration/timing/severity/associated sxs/prior treatment) Patient is a 26 y.o. female presenting with leg pain, fever, vomiting, and abdominal pain.  Leg Pain   Fever Primary symptoms of the febrile illness include fever, abdominal pain and vomiting. Primary symptoms do not include headaches, shortness of breath, dysuria or rash.  Emesis  Associated symptoms include abdominal pain and a fever. Pertinent negatives include no headaches.  Abdominal Pain The primary symptoms of the illness include abdominal pain, fever and vomiting. The primary symptoms of the illness do not include shortness of breath or dysuria.  Symptoms associated with the illness do not include back pain.   Hx per PT, sick for the last 2 days, started with leg pain and heavieness, previous DVT no longer on coumadin 2/2 blindness and could not tolerate medication. No CP or SOB. Some cough, today developed ABD pain and N/V with fever. No hemoptysis. No diarrhea. Pain is stabbing. No radiation. Mod in severity. Took tylenol around 4pm for fever today. Last full dialysis Sat as scheduled.    Past Medical History  Diagnosis Date  . Diabetes mellitus     insulin dependent diabetes mellitus with history of poor control  . Hypertension   . Hyperlipidemia   . Nephrotic syndrome   . Anemia     chronic,  Dr. Mariel Sleet,  Transfused, May, 2012  . 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 Hypertrophic cardiomyopathy     with diastolic heart dysfunction  . 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 Vaginosis   . History of MRSA infection     multiple  . Glaucoma   . Warfarin anticoagulation     DVT  .  Ejection fraction     55%, echo 12/2009  . Manic depressive disorder   . Noncompliance   . port-a-cath   . Dialysis patient     M-W-F  . MRSA bacteremia     Past Surgical History  Procedure Date  . Left arm cyst removal   . Incise and drain abcess 04/24/11    for multiple MRSA -       March 2013 drain port of abcess  . Placement of port a cath     poor venous access  . Av fistula placement   . Hematoma evacuation 05/25/2011    Procedure: EVACUATION HEMATOMA;  Surgeon: Sherren Kerns, MD;  Location: [facility] OR;  Service: Vascular;  Laterality: Right;  Evacuation Hematoma Right Arm  . Port-a-cath removal 07/04/2011    Procedure: MINOR REMOVAL PORT-A-CATH;  Surgeon: Dalia Heading, MD;  Location: [facility] ORS;  Service: General;  Laterality: Left;  Removal Port-A-Cath/Removal Dialysis Catheter in Minor Room    Family History  Problem Relation Age of Onset  . Diabetes Mother   . Hypertension Mother   . Diabetes Father     History  Substance Use Topics  . Smoking status: Never Smoker   . Smokeless tobacco: Not on file  . Alcohol Use: No    OB History    Grav Para Term Preterm Abortions TAB SAB Ect Mult Living  Review of Systems  Constitutional: Positive for fever.  HENT: Negative for neck pain and neck stiffness.   Eyes: Negative for pain.  Respiratory: Negative for shortness of breath.   Cardiovascular: Negative for chest pain.  Gastrointestinal: Positive for vomiting and abdominal pain.  Genitourinary: Negative for dysuria.  Musculoskeletal: Negative for back pain.  Skin: Negative for rash.  Neurological: Negative for headaches.  All other systems reviewed and are negative.    Allergies  Ancef; Coumadin; Morphine and related; Sulfa antibiotics; and Tomato  Home Medications   Current Outpatient Rx  Name Route Sig Dispense Refill  . FLUOXETINE HCL 20 MG PO CAPS Oral Take 20 mg by mouth daily.    . INSULIN GLARGINE 100 UNIT/ML [place] SOLN Subcutaneous  Inject 25 Units into the skin at bedtime.     . INSULIN LISPRO (HUMAN) 100 UNIT/ML [place] SOLN Subcutaneous Inject 5-20 Units into the skin 3 (three) times daily before meals. Per sliding scale. Pt takes anywhere from 5 to 20 units.    Marland Kitchen LABETALOL HCL 300 MG PO TABS Oral Take 1 tablet (300 mg total) by mouth 3 (three) times daily. 90 tablet 1  . RENA-VITE PO TABS Oral Take 1 tablet by mouth daily.    Marland Kitchen SEVELAMER CARBONATE 800 MG PO TABS Oral Take 800 mg by mouth 3 (three) times daily with meals.    Marland Kitchen METOCLOPRAMIDE HCL 10 MG PO TABS Oral Take 1 tablet (10 mg total) by mouth 3 (three) times daily with meals as needed. 30 tablet 0    BP 151/99  Pulse 141  Temp 102 F (38.9 C)  Resp 20  Wt 140 lb 14 oz (63.9 kg)  SpO2 100%  Physical Exam  Constitutional: She is oriented to person, place, and time. She appears well-developed and well-nourished.  HENT:  Head: Normocephalic and atraumatic.  Eyes: Conjunctivae normal and EOM are normal. Pupils are equal, round, and reactive to light.  Neck: Trachea normal. Neck supple. No thyromegaly present.  Cardiovascular: Regular rhythm, S1 normal, S2 normal and normal pulses.     No systolic murmur is present   No diastolic murmur is present  Pulses:      Radial pulses are 2+ on the right side, and 2+ on the left side.       tachy  Pulmonary/Chest: Effort normal and breath sounds normal. She has no wheezes. She has no rhonchi. She has no rales. She exhibits no tenderness.  Abdominal: Soft. Normal appearance and bowel sounds are normal. There is no CVA tenderness and negative Murphy's sign.       Diffuse tenderness with vol guarding  Musculoskeletal:       BLE:s Calves nontender, no cords or erythema, negative Homans sign  Neurological: She is alert and oriented to person, place, and time. She has normal strength. No cranial nerve deficit or sensory deficit. GCS eye subscore is 4. GCS verbal subscore is 5. GCS motor subscore is 6.  Skin: Skin is warm and  dry. No rash noted. She is not diaphoretic.  Psychiatric: Her speech is normal.       Cooperative and appropriate    ED Course  Procedures (including critical care time)  Results for orders placed during the hospital encounter of 10/20/11  URINALYSIS, ROUTINE W REFLEX MICROSCOPIC      Component Value Range   Color, Urine YELLOW  YELLOW   APPearance HAZY (*) CLEAR   Specific Gravity, Urine 1.020  1.005 - 1.030   pH 7.5  5.0 - 8.0   Glucose, UA 250 (*) NEGATIVE mg/dL   Hgb urine dipstick LARGE (*) NEGATIVE   Bilirubin Urine NEGATIVE  NEGATIVE   Ketones, ur NEGATIVE  NEGATIVE mg/dL   Protein, ur >409 (*) NEGATIVE mg/dL   Urobilinogen, UA 0.2  0.0 - 1.0 mg/dL   Nitrite NEGATIVE  NEGATIVE   Leukocytes, UA NEGATIVE  NEGATIVE  CBC      Component Value Range   WBC 5.5  4.0 - 10.5 K/uL   RBC 4.50  3.87 - 5.11 MIL/uL   Hemoglobin 13.9  12.0 - 15.0 g/dL   HCT 81.1  91.4 - 78.2 %   MCV 94.7  78.0 - 100.0 fL   MCH 30.9  26.0 - 34.0 pg   MCHC 32.6  30.0 - 36.0 g/dL   RDW 95.6 (*) 21.3 - 08.6 %   Platelets 187  150 - 400 K/uL  COMPREHENSIVE METABOLIC PANEL      Component Value Range   Sodium 134 (*) 135 - 145 mEq/L   Potassium 6.1 (*) 3.5 - 5.1 mEq/L   Chloride 90 (*) 96 - 112 mEq/L   CO2 23  19 - 32 mEq/L   Glucose, Bld 262 (*) 70 - 99 mg/dL   BUN 53 (*) 6 - 23 mg/dL   Creatinine, Ser 5.78 (*) 0.50 - 1.10 mg/dL   Calcium 46.9  8.4 - 62.9 mg/dL   Total Protein 8.6 (*) 6.0 - 8.3 g/dL   Albumin 3.8  3.5 - 5.2 g/dL   AST 44 (*) 0 - 37 U/L   ALT 25  0 - 35 U/L   Alkaline Phosphatase 164 (*) 39 - 117 U/L   Total Bilirubin 0.5  0.3 - 1.2 mg/dL   GFR calc non Af Amer 6 (*) >90 mL/min   GFR calc Af Amer 6 (*) >90 mL/min  URINE MICROSCOPIC-ADD ON      Component Value Range   Squamous Epithelial / LPF RARE  RARE   WBC, UA 11-20  <3 WBC/hpf   RBC / HPF TOO NUMEROUS TO COUNT  <3 RBC/hpf   Bacteria, UA MANY (*) RARE  CULTURE, BLOOD (ROUTINE X 2)      Component Value Range   Specimen  Description Blood BLOOD RIGHT HAND     Special Requests BOTTLES DRAWN AEROBIC ONLY 5CC     Culture PENDING     Report Status PENDING     Ct Abdomen Pelvis Wo Contrast  10/21/2011  *RADIOLOGY REPORT*  Clinical Data: Abdominal pain.  CT ABDOMEN AND PELVIS WITHOUT CONTRAST  Technique:  Multidetector CT imaging of the abdomen and pelvis was performed following the standard protocol without intravenous contrast.  Comparison: CT abdomen and pelvis 06/30/2011, 06/18/2011 and 06/13/2010.  Findings: There is some dependent atelectasis in the lung bases. No pleural or pericardial effusion.  The kidneys appear edematous bilaterally, worse on the left.  There is some stranding about both kidneys, also worse on the left. Mild fullness of the left renal collecting system is noted.  No urinary tract stones are identified.  No focal fluid collection is seen. The gallbladder is unremarkable.  The spleen, adrenal glands and pancreas appear normal.  No focal liver lesion is seen.  The uterus, adnexa and urinary bladder are unremarkable.  No lymphadenopathy is identified.  No focal bony abnormality.  IMPRESSION:  Edematous appearance of the kidneys bilaterally, worse on the left, with mild fullness of the left renal collecting system noted.  No obstructing stone  is identified.  Question pyelonephritis. Findings discussed with Dr. Dierdre Highman at the time of interpretation.   Original Report Authenticated By: Bernadene Bell. Maricela Curet, M.D.    Dg Chest 2 View  10/21/2011  *RADIOLOGY REPORT*  Clinical Data: Weakness and abdominal pain.  CHEST - 2 VIEW  Comparison: Plain film of the chest 09/19/2011.  Findings: Left IJ approach dialysis catheter remains in place. Lungs are clear.  No edema, pneumothorax or pleural fluid.  Heart size normal.  IMPRESSION: No acute disease.   Original Report Authenticated By: Bernadene Bell. Maricela Curet, M.D.    Hyperkalemia treated kayexalate, insulin, D50.    Date: 10/21/2011  Rate: 125  Rhythm: sinus  tachycardia  QRS Axis: normal  Intervals: normal  ST/T Wave abnormalities: nonspecific ST changes  Conduction Disutrbances:none  Narrative Interpretation:   Old EKG Reviewed: none available  Fever and ABD pain with N/V in dialysis PT.   IV dilaudid and zofran. Tylenol provided for fever.   CT reviewed as above, for some urine in her bladder, cath obtained and treated for possible UTI/ pyelo by CT, U CX pending. Case discussed with DR Onalee Hua who recs BCs, add IV Vanc and will admit.   MDM   VS, nursing notes and old records reviewed. IV ABx. CT. Labs. UA. IV narcotics. MED admit        Sunnie Nielsen, MD 10/21/11 [phone]

## 2011-10-21 NOTE — Progress Notes (Signed)
The patient is a 26 year old woman with end-stage renal disease, diabetes mellitus, hypertension, and hypertrophic cardiomyopathy, who was admitted this morning for nausea, vomiting, abdominal pain, and fever. She was noted to have hyperkalemia which was treated with Kayexalate. Followup serum potassium level is pending. She has signs and symptoms consistent with sepsis from acute pyelonephritis. She is currently on Cipro and vancomycin. These will be continued. Nephrologist, Dr. [name]  has been consulted. His assessment is noted and appreciated. Although the patient has ESRD she is tachycardic and appears volume depleted, so will start gentle IVFs.

## 2011-10-21 NOTE — H&P (Signed)
PCP:   HASANAJ,XAJE A, MD   Chief Complaint:  N/v abd pain  HPI: 26 yo female esrd dialysis on sat/t/r comes in with 2 days of n/v and fever with epi abd pain.  No rashes.  No cough.  No cp.  No dysuria.  No sick contacts.  C/o generalized weakness and bilateral legs hurting painful like it did when she had her dvt.  Off coumadin due to complications with "a bleed in her eyes that caused her blindness".    Review of Systems:  O/w neg  Past Medical History: Past Medical History  Diagnosis Date  . Diabetes mellitus     insulin dependent diabetes mellitus with history of poor control  . Hypertension   . Hyperlipidemia   . Nephrotic syndrome   . Anemia     chronic,  Dr. Mariel Sleet,  Transfused, May, 2012  . 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 Hypertrophic cardiomyopathy     with diastolic heart dysfunction  . 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 Vaginosis   . History of MRSA infection     multiple  . Glaucoma   . Warfarin anticoagulation     DVT  . Ejection fraction     55%, echo 12/2009  . Manic depressive disorder   . Noncompliance   . port-a-cath   . Dialysis patient     M-W-F  . MRSA bacteremia    Past Surgical History  Procedure Date  . Left arm cyst removal   . Incise and drain abcess 04/24/11    for multiple MRSA -       March 2013 drain port of abcess  . Placement of port a cath     poor venous access  . Av fistula placement   . Hematoma evacuation 05/25/2011    Procedure: EVACUATION HEMATOMA;  Surgeon: Sherren Kerns, MD;  Location: [facility] OR;  Service: Vascular;  Laterality: Right;  Evacuation Hematoma Right Arm  . Port-a-cath removal 07/04/2011    Procedure: MINOR REMOVAL PORT-A-CATH;  Surgeon: Dalia Heading, MD;  Location: [facility] ORS;  Service: General;  Laterality: Left;  Removal Port-A-Cath/Removal Dialysis Catheter in Minor Room    Medications: Prior to Admission medications   Medication  Sig Start Date End Date Taking? Authorizing Provider  FLUoxetine (PROZAC) 20 MG capsule Take 20 mg by mouth daily.   Yes Historical Provider, MD  insulin glargine (LANTUS) 100 UNIT/ML injection Inject 25 Units into the skin at bedtime.    Yes Historical Provider, MD  insulin lispro (HUMALOG) 100 UNIT/ML injection Inject 5-20 Units into the skin 3 (three) times daily before meals. Per sliding scale. Pt takes anywhere from 5 to 20 units.   Yes Historical Provider, MD  labetalol (NORMODYNE) 300 MG tablet Take 1 tablet (300 mg total) by mouth 3 (three) times daily. 07/09/11  Yes Erick Blinks, MD  multivitamin (RENA-VIT) TABS tablet Take 1 tablet by mouth daily.   Yes Historical Provider, MD  sevelamer (RENVELA) 800 MG tablet Take 800 mg by mouth 3 (three) times daily with meals.   Yes Historical Provider, MD  metoCLOPramide (REGLAN) 10 MG tablet Take 1 tablet (10 mg total) by mouth 3 (three) times daily with meals as needed. 07/09/11 07/19/11  Erick Blinks, MD    Allergies:   Allergies  Allergen Reactions  . Ancef (Cefazolin Sodium) Shortness Of Breath    wheezing  . Coumadin (Warfarin  Sodium)     Eyes bleed  . Morphine And Related Itching  . Sulfa Antibiotics Anaphylaxis and Shortness Of Breath  . Tomato Hives and Swelling    Social History:  reports that she has never smoked. She does not have any smokeless tobacco history on file. She reports that she does not drink alcohol or use illicit drugs.  Family History: Family History  Problem Relation Age of Onset  . Diabetes Mother   . Hypertension Mother   . Diabetes Father     Physical Exam: Filed Vitals:   10/21/11 0100 10/21/11 0130 10/21/11 0200 10/21/11 0217  BP: 136/88 121/92 124/90 124/90  Pulse: 136 137  119  Temp:      Resp:    20  Weight:      SpO2: 95% 96%  94%   General appearance: alert, cooperative and no distress Lungs: clear to auscultation bilaterally Heart: regular rate and rhythm, S1, S2 normal, no murmur,  click, rub or gallop Abdomen: soft, non-tender; bowel sounds normal; no masses,  no organomegaly Extremities: extremities normal, atraumatic, no cyanosis or edema Pulses: 2+ and symmetric Skin: Skin color, texture, turgor normal. No rashes or lesions Neurologic: Grossly normal    Labs on Admission:   [facility] 10/21/11 0004  NA 134*  K 6.1*  CL 90*  CO2 23  GLUCOSE 262*  BUN 53*  CREATININE 8.79*  CALCIUM 10.2  MG --  PHOS --    Basename 10/21/11 0004  AST 44*  ALT 25  ALKPHOS 164*  BILITOT 0.5  PROT 8.6*  ALBUMIN 3.8    Basename 10/21/11 0004  WBC 5.5  NEUTROABS --  HGB 13.9  HCT 42.6  MCV 94.7  PLT 187   Radiological Exams on Admission: Ct Abdomen Pelvis Wo Contrast  10/21/2011  *RADIOLOGY REPORT*  Clinical Data: Abdominal pain.  CT ABDOMEN AND PELVIS WITHOUT CONTRAST  Technique:  Multidetector CT imaging of the abdomen and pelvis was performed following the standard protocol without intravenous contrast.  Comparison: CT abdomen and pelvis 06/30/2011, 06/18/2011 and 06/13/2010.  Findings: There is some dependent atelectasis in the lung bases. No pleural or pericardial effusion.  The kidneys appear edematous bilaterally, worse on the left.  There is some stranding about both kidneys, also worse on the left. Mild fullness of the left renal collecting system is noted.  No urinary tract stones are identified.  No focal fluid collection is seen. The gallbladder is unremarkable.  The spleen, adrenal glands and pancreas appear normal.  No focal liver lesion is seen.  The uterus, adnexa and urinary bladder are unremarkable.  No lymphadenopathy is identified.  No focal bony abnormality.  IMPRESSION:  Edematous appearance of the kidneys bilaterally, worse on the left, with mild fullness of the left renal collecting system noted.  No obstructing stone is identified.  Question pyelonephritis. Findings discussed with Dr. Dierdre Highman at the time of interpretation.   Original Report  Authenticated By: Bernadene Bell. Maricela Curet, M.D.    Dg Chest 2 View  10/21/2011  *RADIOLOGY REPORT*  Clinical Data: Weakness and abdominal pain.  CHEST - 2 VIEW  Comparison: Plain film of the chest 09/19/2011.  Findings: Left IJ approach dialysis catheter remains in place. Lungs are clear.  No edema, pneumothorax or pleural fluid.  Heart size normal.  IMPRESSION: No acute disease.   Original Report Authenticated By: Bernadene Bell. Maricela Curet, M.D.     Assessment/Plan Present on Admission:  26 yo female with fever, n/v, epi abd pain esrd dialysis dependent .  Nausea & vomiting .Abdominal pain .End stage renal disease .Hyperkalemia .Fever  Blood cx.  Urine cx.  Cover with broad spectrum abx vanco/rocephin as unclear etiology of infectious source but likely viral gastroenteritis (many cases in community last several days) but recent mrsa bacteremia also.  Will also ck ble u/s.  nephro consult for dialysis needs and further input.  kayexelate in ED for hi K which she has already had multiple stools.  Will hold off on ivf until seen by nephro later this am.   Tian Davison A [phone] 10/21/2011, 4:49 AM

## 2011-10-22 ENCOUNTER — Encounter (HOSPITAL_COMMUNITY): Payer: Medicaid Other

## 2011-10-22 ENCOUNTER — Inpatient Hospital Stay (HOSPITAL_COMMUNITY): Payer: Medicaid Other

## 2011-10-22 ENCOUNTER — Encounter (HOSPITAL_COMMUNITY): Payer: Self-pay | Admitting: Internal Medicine

## 2011-10-22 DIAGNOSIS — Z9119 Patient's noncompliance with other medical treatment and regimen: Secondary | ICD-10-CM

## 2011-10-22 DIAGNOSIS — R7881 Bacteremia: Secondary | ICD-10-CM | POA: Diagnosis present

## 2011-10-22 DIAGNOSIS — I10 Essential (primary) hypertension: Secondary | ICD-10-CM | POA: Diagnosis present

## 2011-10-22 DIAGNOSIS — R000 Tachycardia, unspecified: Secondary | ICD-10-CM | POA: Diagnosis present

## 2011-10-22 DIAGNOSIS — E1029 Type 1 diabetes mellitus with other diabetic kidney complication: Secondary | ICD-10-CM | POA: Diagnosis present

## 2011-10-22 LAB — COMPREHENSIVE METABOLIC PANEL
CO2: 28 mEq/L (ref 19–32)
Calcium: 9.2 mg/dL (ref 8.4–10.5)
Creatinine, Ser: 5.81 mg/dL — ABNORMAL HIGH (ref 0.50–1.10)
GFR calc Af Amer: 11 mL/min — ABNORMAL LOW (ref >90)
GFR calc non Af Amer: 9 mL/min — ABNORMAL LOW (ref >90)
Glucose, Bld: 153 mg/dL — ABNORMAL HIGH (ref 70–99)

## 2011-10-22 LAB — CBC
Hemoglobin: 11.8 g/dL — ABNORMAL LOW (ref 12.0–15.0)
MCHC: 33.1 g/dL (ref 30.0–36.0)
Platelets: 209 10*3/uL (ref 150–400)
RBC: 3.75 MIL/uL — ABNORMAL LOW (ref 3.87–5.11)
RDW: 16 % — ABNORMAL HIGH (ref 11.5–15.5)

## 2011-10-22 LAB — GLUCOSE, CAPILLARY
Glucose-Capillary: 163 mg/dL — ABNORMAL HIGH (ref 70–99)
Glucose-Capillary: 163 mg/dL — ABNORMAL HIGH (ref 70–99)
Glucose-Capillary: 167 mg/dL — ABNORMAL HIGH (ref 70–99)

## 2011-10-22 LAB — HEMOGLOBIN A1C
Hgb A1c MFr Bld: 7.7 % — ABNORMAL HIGH (ref <5.7)
Mean Plasma Glucose: 174 mg/dL — ABNORMAL HIGH (ref <117)

## 2011-10-22 LAB — TSH: TSH: 1.452 u[IU]/mL (ref 0.350–4.500)

## 2011-10-22 MED ORDER — SODIUM CHLORIDE 0.9 % IJ SOLN
INTRAMUSCULAR | Status: AC
  Administered 2011-10-22: 3 mL
  Filled 2011-10-22: qty 3

## 2011-10-22 MED ORDER — SODIUM CHLORIDE 0.9 % IJ SOLN
10.0000 mL | Freq: Two times a day (BID) | INTRAMUSCULAR | Status: DC
  Administered 2011-10-22: 20 mL
  Administered 2011-10-22 – 2011-10-24 (×3): 10 mL
  Administered 2011-10-24: 20 mL
  Administered 2011-10-25 – 2011-10-28 (×5): 10 mL
  Filled 2011-10-22: qty 3
  Filled 2011-10-22: qty 6
  Filled 2011-10-22 (×7): qty 3

## 2011-10-22 MED ORDER — SODIUM CHLORIDE 0.9 % IJ SOLN
INTRAMUSCULAR | Status: AC
  Administered 2011-10-22: 16:00:00
  Filled 2011-10-22: qty 3

## 2011-10-22 MED ORDER — SODIUM CHLORIDE 0.9 % IJ SOLN
10.0000 mL | INTRAMUSCULAR | Status: DC | PRN
  Administered 2011-10-25: 10 mL
  Administered 2011-10-28: 20 mL
  Filled 2011-10-22 (×2): qty 6
  Filled 2011-10-22 (×2): qty 3

## 2011-10-22 NOTE — Progress Notes (Signed)
UR Chart Review Completed  

## 2011-10-22 NOTE — Progress Notes (Signed)
Lab technician called and received a lab value of gram + cocci.  Paged MD and received no response.  Patient currently on vancomycin and cipro for iv antibiotics.

## 2011-10-22 NOTE — Progress Notes (Signed)
Notifed Dr. Onalee Hua of patient's second bottle growing gram + cocci.  Received response from MD.  No new orders received.

## 2011-10-22 NOTE — Care Management Note (Signed)
    Page 1 of 2   10/30/2011     2:37:17 PM   CARE MANAGEMENT NOTE 10/30/2011  Patient:  [name], [name]   Account Number:  [id number]  Date Initiated:  10/22/2011  Documentation initiated by:  Rosemary Holms  Subjective/Objective Assessment:   Pt admitted from home where she lives with relatives. Admitted with acute pylonephritis/ESRD. Pt has porta cath for dialysis. Spoke to pt at bedside. She denies HH needs.     Action/Plan:   Nuala Alpha from [facility] (Medicaid Case Manager) will follow pt at home.   Anticipated DC Date:  10/30/2011   Anticipated DC Plan:  HOME W HOME HEALTH SERVICES      DC Planning Services  CM consult      Choice offered to / List presented to:          [facility] arranged  HH-1 RN  HH-10 DISEASE MANAGEMENT      HH agency  [facility]   Status of service:  Completed, signed off Medicare Important Message given?  YES (If response is "NO", the following Medicare IM given date fields will be blank) Date Medicare IM given:  10/28/2011 Date Additional Medicare IM given:    Discharge Disposition:  HOME W HOME HEALTH SERVICES  Per UR Regulation:    If discussed at Long Length of Stay Meetings, dates discussed:   10/29/2011    Comments:  10/30/11 Rosemary Holms RN BSN CM [facility] notified of DC and RN HH. Faxed DC summary to [facility] for pt to receive Vanc IV with Dialysis.  10/28/11 Victoriana Aziz Deatra Ina to friend who lives with her. She stated that [facility] was previously there to assist pt and it would be beneficial to have RN assist with insulin/DM. Friend also states that pt gets Dialysis at [facility] in [place]. Contacted Elnita Maxwell [phone]) and faxed progress note from today outlining culture, antibiotics.Marland Kitchen. (fax [phone]) Antibiotics 4 weeks following dialysis.  10/22/11 1030 Renso Swett RN BSN CM

## 2011-10-22 NOTE — Progress Notes (Signed)
Subjective: Interval History: has complaints  Some nausea but no vomiting. She denies any difficulty breathing..  Objective: Vital signs in last 24 hours: Temp:  [98.4 F (36.9 C)-99.5 F (37.5 C)] 99.4 F (37.4 C) (09/17 0434) Pulse Rate:  [118-120] 119  (09/17 0434) Resp:  [13-17] 16  (09/17 0434) BP: (68-131)/(41-86) 131/84 mmHg (09/17 0434) SpO2:  [96 %-100 %] 96 % (09/17 0434) Weight:  [65.363 kg (144 lb 1.6 oz)-69.1 kg (152 lb 5.4 oz)] 65.363 kg (144 lb 1.6 oz) (09/17 0434) Weight change: 5.2 kg (11 lb 7.4 oz)  Intake/Output from previous day: 09/16 0701 - 09/17 0700 In: 1240 [P.O.:840; IV Piggyback:400] Out: 3000  Intake/Output this shift:    Generally she is alert and apparent distress Chest is clear to auscultation Heart exam regular rate and rhythm Extremities no edema.  Lab Results:  Basename 10/22/11 0530 10/21/11 0004  WBC 6.8 5.5  HGB 11.8* 13.9  HCT 35.7* 42.6  PLT 209 187   BMET:  Basename 10/22/11 0530 10/21/11 0910  NA 135 133*  K 3.9 4.9  CL 94* 91*  CO2 28 23  GLUCOSE 153* 150*  BUN 24* 51*  CREATININE 5.81* 8.66*  CALCIUM 9.2 9.7   No results found for this basename: PTH:2 in the last 72 hours Iron Studies: No results found for this basename: IRON,TIBC,TRANSFERRIN,FERRITIN in the last 72 hours  Studies/Results: Ct Abdomen Pelvis Wo Contrast  10/21/2011  *RADIOLOGY REPORT*  Clinical Data: Abdominal pain.  CT ABDOMEN AND PELVIS WITHOUT CONTRAST  Technique:  Multidetector CT imaging of the abdomen and pelvis was performed following the standard protocol without intravenous contrast.  Comparison: CT abdomen and pelvis 06/30/2011, 06/18/2011 and 06/13/2010.  Findings: There is some dependent atelectasis in the lung bases. No pleural or pericardial effusion.  The kidneys appear edematous bilaterally, worse on the left.  There is some stranding about both kidneys, also worse on the left. Mild fullness of the left renal collecting system is noted.  No  urinary tract stones are identified.  No focal fluid collection is seen. The gallbladder is unremarkable.  The spleen, adrenal glands and pancreas appear normal.  No focal liver lesion is seen.  The uterus, adnexa and urinary bladder are unremarkable.  No lymphadenopathy is identified.  No focal bony abnormality.  IMPRESSION:  Edematous appearance of the kidneys bilaterally, worse on the left, with mild fullness of the left renal collecting system noted.  No obstructing stone is identified.  Question pyelonephritis. Findings discussed with Dr. Dierdre Highman at the time of interpretation.   Original Report Authenticated By: Bernadene Bell. Maricela Curet, M.D.    Dg Chest 2 View  10/21/2011  *RADIOLOGY REPORT*  Clinical Data: Weakness and abdominal pain.  CHEST - 2 VIEW  Comparison: Plain film of the chest 09/19/2011.  Findings: Left IJ approach dialysis catheter remains in place. Lungs are clear.  No edema, pneumothorax or pleural fluid.  Heart size normal.  IMPRESSION: No acute disease.   Original Report Authenticated By: Bernadene Bell. Maricela Curet, M.D.    US Venous Img Lower Bilateral  10/21/2011  *RADIOLOGY REPORT*  Clinical Data: Bilateral leg pain.  Dialysis.  BILATERAL LOWER EXTREMITY VENOUS DUPLEX ULTRASOUND  Technique:  Gray-scale sonography with graded compression, as well as color Doppler and duplex ultrasound, were performed to evaluate the deep venous system of both lower extremities from the level of the common femoral vein through the popliteal and proximal calf veins.  Spectral Doppler was utilized to evaluate flow at rest and with  distal augmentation maneuvers.  Comparison:  12/24/2009.  05/17/2009.  Findings:  Normal compressibility of bilateral common femoral, superficial femoral, and popliteal veins is demonstrated, as well as the visualized proximal calf veins.  No filling defects to suggest DVT on grayscale or color Doppler imaging.  Doppler waveforms show normal direction of venous flow, normal respiratory  phasicity and response to augmentation.  Again noted is slight mural thrombus extending from the right femoral vein into the right common femoral vein and greater saphenous vein similar that seen on prior studies, representing chronic DVT.  IMPRESSION: No evidence of acute deep vein thrombosis in either lower extremity.   Original Report Authenticated By: Elsie Stain, M.D.     I have reviewed the patient's current medications.  Assessment/Plan: Problem #1 end-stage renal disease she status post hemodialysis yesterday her pending creatinine is was in acceptable range. Problem #2 hyperkalemia potassium has corrected Problem #3 history of diabetes Problem #4 hypertension blood pressure seems to be controlled very well Problem #5 history of anemia secondary to chronic renal failure H&H stable Problem #6 possible pyelonephritis she is a febrile and her white blood cell count is normal. 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 improving very noncompliant with her medication and diet as outpatient. Plan: We'll continue his present management we'll make arrangements for patient to get dialysis tomorrow. And will follow her blood work.    LOS: 2 days   Cristobal Advani S 10/22/2011,8:06 AM

## 2011-10-22 NOTE — Progress Notes (Addendum)
Subjective: The patient says that she feels a little better this morning. However, she still has some nausea. She denies vomiting. Her abdominal pain is less. She had a small loose stool last night, none this morning. She wants to try solid foods.  Objective: Vital signs in last 24 hours: Filed Vitals:   10/21/11 1634 10/21/11 1700 10/21/11 2133 10/22/11 0434  BP:  97/56 105/66 131/84  Pulse:   118 119  Temp:   99.5 F (37.5 C) 99.4 F (37.4 C)  TempSrc:   Oral Oral  Resp: 15 16 16 16   Height:      Weight:   65.363 kg (144 lb 1.6 oz) 65.363 kg (144 lb 1.6 oz)  SpO2:   99% 96%    Intake/Output Summary (Last 24 hours) at 10/22/11 0943 Last data filed at 10/21/11 1610  Gross per 24 hour  Intake    800 ml  Output   3000 ml  Net  -2200 ml    Weight change: 5.2 kg (11 lb 7.4 oz)  Physical exam: General: 26 year old African-American woman sitting up in bed, in no acute distress. Lungs: Clear to auscultation bilaterally. Heart: S1, S2, with tachycardia. Chest wall: Dialysis catheter located at the left upper chest. No surrounding erythema or drainage. Nontender. Abdomen: Positive bowel sounds, soft, nontender, nondistended. Extremities: No pedal edema.  Lab Results: Basic Metabolic Panel:  Basename 10/22/11 0530 10/21/11 0910  NA 135 133*  K 3.9 4.9  CL 94* 91*  CO2 28 23  GLUCOSE 153* 150*  BUN 24* 51*  CREATININE 5.81* 8.66*  CALCIUM 9.2 9.7  MG -- --  PHOS 6.9* 8.6*   Liver Function Tests:  Basename 10/22/11 0530 10/21/11 0910 10/21/11 0004  AST 19 -- 44*  ALT 18 -- 25  ALKPHOS 129* -- 164*  BILITOT 0.4 -- 0.5  PROT 7.6 -- 8.6*  ALBUMIN 2.8* 3.3* --    Basename 10/21/11 0723  LIPASE 13  AMYLASE --   No results found for this basename: AMMONIA:2 in the last 72 hours CBC:  Basename 10/22/11 0530 10/21/11 0004  WBC 6.8 5.5  NEUTROABS -- --  HGB 11.8* 13.9  HCT 35.7* 42.6  MCV 95.2 94.7  PLT 209 187   Cardiac Enzymes: No results found for this  basename: CKTOTAL:3,CKMB:3,CKMBINDEX:3,TROPONINI:3 in the last 72 hours BNP: No results found for this basename: PROBNP:3 in the last 72 hours D-Dimer: No results found for this basename: DDIMER:2 in the last 72 hours CBG:  Basename 10/22/11 0734 10/22/11 0621 10/21/11 2225 10/21/11 1630 10/21/11 1129 10/21/11 0758  GLUCAP 163* 163* 147* 104* 126* 115*   Hemoglobin A1C: No results found for this basename: HGBA1C in the last 72 hours Fasting Lipid Panel: No results found for this basename: CHOL,HDL,LDLCALC,TRIG,CHOLHDL,LDLDIRECT in the last 72 hours Thyroid Function Tests: No results found for this basename: TSH,T4TOTAL,FREET4,T3FREE,THYROIDAB in the last 72 hours Anemia Panel: No results found for this basename: VITAMINB12,FOLATE,FERRITIN,TIBC,IRON,RETICCTPCT in the last 72 hours Coagulation: No results found for this basename: LABPROT:2,INR:2 in the last 72 hours Urine Drug Screen: Drugs of Abuse     Component Value Date/Time   LABOPIA POSITIVE* 05/21/2009 2300   COCAINSCRNUR NONE DETECTED 05/21/2009 2300   LABBENZ NONE DETECTED 05/21/2009 2300   AMPHETMU NONE DETECTED 05/21/2009 2300   THCU NONE DETECTED 05/21/2009 2300   LABBARB  Value: NONE DETECTED        DRUG SCREEN FOR MEDICAL PURPOSES ONLY.  IF CONFIRMATION IS NEEDED FOR ANY PURPOSE, NOTIFY LAB WITHIN 5  DAYS.        LOWEST DETECTABLE LIMITS FOR URINE DRUG SCREEN Drug Class       Cutoff (ng/mL) Amphetamine      1000 Barbiturate      200 Benzodiazepine   200 Tricyclics       300 Opiates          300 Cocaine          300 THC              50 05/21/2009 2300    Alcohol Level: No results found for this basename: ETH:2 in the last 72 hours Urinalysis:  Basename 10/21/11 0305  COLORURINE YELLOW  LABSPEC 1.020  PHURINE 7.5  GLUCOSEU 250*  HGBUR LARGE*  BILIRUBINUR NEGATIVE  KETONESUR NEGATIVE  PROTEINUR >300*  UROBILINOGEN 0.2  NITRITE NEGATIVE  LEUKOCYTESUR NEGATIVE   Misc. Labs:   Micro: Recent Results (from the past  240 hour(s))  CULTURE, BLOOD (ROUTINE X 2)     Status: Normal (Preliminary result)   Collection Time   10/21/11  4:27 AM      Component Value Range Status Comment   Specimen Description BLOOD RIGHT HAND   Final    Special Requests BOTTLES DRAWN AEROBIC ONLY 5CC   Final    Culture     Final    Value: GRAM POSITIVE COCCI     CALLED TO JOHNSON B AT 0308 ON [id number] BY FORSYTH K   Report Status PENDING   Incomplete   CULTURE, BLOOD (ROUTINE X 2)     Status: Normal (Preliminary result)   Collection Time   10/21/11  4:47 AM      Component Value Range Status Comment   Specimen Description BLOOD RIGHT ARM   Final    Special Requests BOTTLES DRAWN AEROBIC ONLY 5CC   Final    Culture     Final    Value: GRAM POSITIVE COCCI     CALLED TO JOHNSON B AT 2210 ON [id number] BY FORSYTH K   Report Status PENDING   Incomplete   MRSA PCR SCREENING     Status: Normal   Collection Time   10/21/11  5:18 AM      Component Value Range Status Comment   MRSA by PCR NEGATIVE  NEGATIVE Final     Studies/Results: Ct Abdomen Pelvis Wo Contrast  10/21/2011  *RADIOLOGY REPORT*  Clinical Data: Abdominal pain.  CT ABDOMEN AND PELVIS WITHOUT CONTRAST  Technique:  Multidetector CT imaging of the abdomen and pelvis was performed following the standard protocol without intravenous contrast.  Comparison: CT abdomen and pelvis 06/30/2011, 06/18/2011 and 06/13/2010.  Findings: There is some dependent atelectasis in the lung bases. No pleural or pericardial effusion.  The kidneys appear edematous bilaterally, worse on the left.  There is some stranding about both kidneys, also worse on the left. Mild fullness of the left renal collecting system is noted.  No urinary tract stones are identified.  No focal fluid collection is seen. The gallbladder is unremarkable.  The spleen, adrenal glands and pancreas appear normal.  No focal liver lesion is seen.  The uterus, adnexa and urinary bladder are unremarkable.  No lymphadenopathy is  identified.  No focal bony abnormality.  IMPRESSION:  Edematous appearance of the kidneys bilaterally, worse on the left, with mild fullness of the left renal collecting system noted.  No obstructing stone is identified.  Question pyelonephritis. Findings discussed with Dr. Dierdre Highman at the time of interpretation.   Original Report Authenticated  By: Bernadene Bell Maricela Curet, M.D.    Dg Chest 2 View  10/21/2011  *RADIOLOGY REPORT*  Clinical Data: Weakness and abdominal pain.  CHEST - 2 VIEW  Comparison: Plain film of the chest 09/19/2011.  Findings: Left IJ approach dialysis catheter remains in place. Lungs are clear.  No edema, pneumothorax or pleural fluid.  Heart size normal.  IMPRESSION: No acute disease.   Original Report Authenticated By: Bernadene Bell. Maricela Curet, M.D.    US Venous Img Lower Bilateral  10/21/2011  *RADIOLOGY REPORT*  Clinical Data: Bilateral leg pain.  Dialysis.  BILATERAL LOWER EXTREMITY VENOUS DUPLEX ULTRASOUND  Technique:  Gray-scale sonography with graded compression, as well as color Doppler and duplex ultrasound, were performed to evaluate the deep venous system of both lower extremities from the level of the common femoral vein through the popliteal and proximal calf veins.  Spectral Doppler was utilized to evaluate flow at rest and with distal augmentation maneuvers.  Comparison:  12/24/2009.  05/17/2009.  Findings:  Normal compressibility of bilateral common femoral, superficial femoral, and popliteal veins is demonstrated, as well as the visualized proximal calf veins.  No filling defects to suggest DVT on grayscale or color Doppler imaging.  Doppler waveforms show normal direction of venous flow, normal respiratory phasicity and response to augmentation.  Again noted is slight mural thrombus extending from the right femoral vein into the right common femoral vein and greater saphenous vein similar that seen on prior studies, representing chronic DVT.  IMPRESSION: No evidence of acute deep  vein thrombosis in either lower extremity.   Original Report Authenticated By: Elsie Stain, M.D.     Medications:  Scheduled:    . ciprofloxacin  400 mg Intravenous Q24H  . FLUoxetine  20 mg Oral Daily  . insulin aspart  0-5 Units Subcutaneous QHS  . insulin aspart  0-9 Units Subcutaneous TID WC  . labetalol  300 mg Oral TID  . multivitamin  1 tablet Oral Daily  . sevelamer  800 mg Oral TID WC  . sodium chloride  3 mL Intravenous Q12H  . sodium chloride      . vancomycin  1,000 mg Intravenous Q M,W,F-HD   Continuous:  ZOX:WRUEAV chloride, heparin, heparin, HYDROmorphone (DILAUDID) injection, metoCLOPramide, ondansetron (ZOFRAN) IV, ondansetron, sodium chloride  Assessment: Principal Problem:  *Nausea & vomiting Active Problems:  Sepsis  Acute pyelonephritis  Hyperkalemia  End stage renal disease  Abdominal pain  Fever  Bacteremia due to Gram-positive bacteria  Sinus tachycardia  Medical non-compliance  Diabetes mellitus with nephropathy  Hypertension     1. Sepsis and gram-positive cocci bacteremia. The source is unknown, but will consider dialysis catheter infection and acute pyelonephritis as the potential etiologies. We'll continue Cipro and vancomycin.  Acute pyelonephritis. She is symptomatically improved. We'll continue Cipro. Urine cultures pending.  Sinus tachycardia. This may be secondary to the patient's overall infectious process. She is currently afebrile. Will assess her thyroid function.  Hypertension. Currently stable on labetalol.  Diabetes mellitus with nephropathy. Her blood sugars are reasonable. We'll continue sliding scale NovoLog.  End stage renal disease. Per Dr. [name], the patient has a history of noncompliance. She was dialyzed yesterday. Her BUN and creatinine are better. Her serum potassium has normalized.  Hyperkalemia secondary to end-stage renal disease. Status post Kayexalate and hemodialysis. Serum potassium now within normal  limits.  Plan:  1. Will advance her diet to a renal diet. 2. Will await the results of the identification of the gram-positive cocci. 3. Increase activity  as tolerated. 4. For further evaluation, we'll order a hemoglobin A1c, TSH, and free T4.     LOS: 2 days   Konstantine Gervasi 10/22/2011, 9:43 AM

## 2011-10-23 ENCOUNTER — Inpatient Hospital Stay (HOSPITAL_COMMUNITY): Payer: Medicaid Other

## 2011-10-23 ENCOUNTER — Encounter (HOSPITAL_COMMUNITY): Payer: Self-pay | Admitting: Internal Medicine

## 2011-10-23 DIAGNOSIS — R7881 Bacteremia: Secondary | ICD-10-CM | POA: Diagnosis present

## 2011-10-23 DIAGNOSIS — N10 Acute tubulo-interstitial nephritis: Secondary | ICD-10-CM

## 2011-10-23 LAB — BASIC METABOLIC PANEL
BUN: 36 mg/dL — ABNORMAL HIGH (ref 6–23)
Chloride: 92 mEq/L — ABNORMAL LOW (ref 96–112)
GFR calc Af Amer: 8 mL/min — ABNORMAL LOW (ref >90)
Glucose, Bld: 150 mg/dL — ABNORMAL HIGH (ref 70–99)
Potassium: 4.1 mEq/L (ref 3.5–5.1)
Sodium: 134 mEq/L — ABNORMAL LOW (ref 135–145)

## 2011-10-23 LAB — GLUCOSE, CAPILLARY
Glucose-Capillary: 154 mg/dL — ABNORMAL HIGH (ref 70–99)
Glucose-Capillary: 174 mg/dL — ABNORMAL HIGH (ref 70–99)
Glucose-Capillary: 224 mg/dL — ABNORMAL HIGH (ref 70–99)
Glucose-Capillary: 191 mg/dL — ABNORMAL HIGH (ref 70–99)
Glucose-Capillary: 328 mg/dL — ABNORMAL HIGH (ref 70–99)

## 2011-10-23 LAB — CBC
HCT: 33 % — ABNORMAL LOW (ref 36.0–46.0)
Hemoglobin: 10.8 g/dL — ABNORMAL LOW (ref 12.0–15.0)
MCH: 31.2 pg (ref 26.0–34.0)
MCHC: 32.7 g/dL (ref 30.0–36.0)
RBC: 3.46 MIL/uL — ABNORMAL LOW (ref 3.87–5.11)

## 2011-10-23 LAB — T4, FREE: Free T4: 1.03 ng/dL (ref 0.80–1.80)

## 2011-10-23 MED ORDER — SODIUM CHLORIDE 0.9 % IV SOLN
INTRAVENOUS | Status: DC
  Administered 2011-10-26: 10 mL/h via INTRAVENOUS

## 2011-10-23 MED ORDER — EPOETIN ALFA 10000 UNIT/ML IJ SOLN
2000.0000 [IU] | INTRAMUSCULAR | Status: DC
  Administered 2011-10-23 – 2011-10-30 (×4): 2000 [IU] via INTRAVENOUS
  Filled 2011-10-23 (×6): qty 1

## 2011-10-23 MED ORDER — SODIUM CHLORIDE 0.9 % IV SOLN: 250.0000 mL | INTRAVENOUS | Status: DC | PRN

## 2011-10-23 NOTE — Progress Notes (Signed)
Dialysis tx initiated via L IJ PC withpout difficulty. Outpatient plan of care on chart/reviewed.  Sterile dsg change performed.  Dr. Wolfgang Phoenix at bedside

## 2011-10-23 NOTE — Progress Notes (Signed)
[name]  MRN: [id number]  DOB/AGE: 1985-10-28 26 y.o.  Primary Care Physician:HASANAJ,XAJE A, MD  Admit date: 10/20/2011  Chief Complaint:  Chief Complaint  Patient presents with  . Leg Pain  . Fever  . Nausea  . Emesis  . Abdominal Pain    S-Pt presented on  10/20/2011 with  Chief Complaint  Patient presents with  . Leg Pain  . Fever  . Nausea  . Emesis  . Abdominal Pain  .   Pt today feels better. Pt offers no new complaints. Pt seen on dialysis.       . ciprofloxacin  400 mg Intravenous Q24H  . epoetin alfa  2,000 Units Intravenous 3 times weekly  . FLUoxetine  20 mg Oral Daily  . insulin aspart  0-5 Units Subcutaneous QHS  . insulin aspart  0-9 Units Subcutaneous TID WC  . labetalol  300 mg Oral TID  . multivitamin  1 tablet Oral Daily  . sevelamer  800 mg Oral TID WC  . sodium chloride  10-40 mL Intracatheter Q12H  . sodium chloride  3 mL Intravenous Q12H  . sodium chloride      . vancomycin  1,000 mg Intravenous Q M,W,F-HD      Physical Exam: Vital signs in last 24 hours: Temp:  [98.2 F (36.8 C)-98.6 F (37 C)] 98.2 F (36.8 C) (09/18 0556) Pulse Rate:  [99-100] 100  (09/18 0556) Resp:  [18-19] 19  (09/18 0556) BP: (130-150)/(87-104) 150/104 mmHg (09/18 0556) SpO2:  [97 %-99 %] 99 % (09/18 0556) Weight:  [149 lb (67.586 kg)-153 lb 11.2 oz (69.718 kg)] 151 lb 0.2 oz (68.5 kg) (09/18 1017) Weight change: 1 lb 5.8 oz (0.618 kg) Last BM Date: 10/21/11  Intake/Output from previous day: 09/17 0701 - 09/18 0700 In: 1117.5 [P.O.:240; I.V.:877.5] Out: -  Total I/O In: 310 [P.O.:300; I.V.:10] Out: -    Physical Exam: General- pt is awake,alert, oriented to time place and person Resp- No acute REsp distress, CTA B/L NO Rhonchi CVS- S1S2 regular ij rate and rhythm GIT- BS+, soft, NT, ND EXT- NO LE Edema, Cyanosis Access- left PC in situ  Lab Results: CBC  Basename 10/23/11 0527 10/22/11 0530  WBC 6.7 6.8  HGB 10.8* 11.8*  HCT  33.0* 35.7*  PLT 231 209    BMET  Basename 10/23/11 0527 10/22/11 0530  NA 134* 135  K 4.1 3.9  CL 92* 94*  CO2 26 28  GLUCOSE 150* 153*  BUN 36* 24*  CREATININE 7.41* 5.81*  CALCIUM 9.1 9.2    MICRO Recent Results (from the past 240 hour(s))  CULTURE, BLOOD (ROUTINE X 2)     Status: Normal (Preliminary result)   Collection Time   10/21/11  4:27 AM      Component Value Range Status Comment   Specimen Description BLOOD RIGHT HAND   Final    Special Requests BOTTLES DRAWN AEROBIC ONLY 5CC   Final    Culture  Setup Time 10/22/2011 02:53   Final    Culture     Final    Value: STAPHYLOCOCCUS AUREUS     Note: RIFAMPIN AND GENTAMICIN SHOULD NOT BE USED AS SINGLE DRUGS FOR TREATMENT OF STAPH INFECTIONS.     Note: Gram Stain Report Called to,Read Back By and Verified With: Adams B @ [phone] BY FORSYTH K Performed at [facility]   Report Status PENDING   Incomplete   CULTURE, BLOOD (ROUTINE X 2)  Status: Normal (Preliminary result)   Collection Time   10/21/11  4:47 AM      Component Value Range Status Comment   Specimen Description BLOOD RIGHT ARM   Final    Special Requests BOTTLES DRAWN AEROBIC ONLY 5CC   Final    Culture  Setup Time 10/21/2011 21:45   Final    Culture     Final    Value: STAPHYLOCOCCUS AUREUS     Note: Gram Stain Report Called to,Read Back By and Verified With: Gean Quint AT 2210 [id number] BY FORSYTH K Performed at [facility]   Report Status PENDING   Incomplete   MRSA PCR SCREENING     Status: Normal   Collection Time   10/21/11  5:18 AM      Component Value Range Status Comment   MRSA by PCR NEGATIVE  NEGATIVE Final       Lab Results  Component Value Date   PTH 256.0* 07/04/2011   CALCIUM 9.1 10/23/2011   CAION 1.16 06/04/2010   PHOS 6.9* 10/22/2011               Impression: 1)Renal  ESRD on HD On MWF schedule Pt currently being dialyzed. Pt tolerating tx well.   2)HTN Target Organ damage   CKD  Medication- On Alpha and beta Blockers    3)Anemia HGb at goal (9--11)   4)CKD Mineral-Bone Disorder PTH acceptable. Secondary Hyperparathyroidism present. Phosphorus NOT at goal.   5)ID- Sepsis  6)FEN  Normokalemic Hyponatremic-secondary to ESRD   7)Acid base Co2 at goal     Plan:  Will start on epo Will continue current tx plan.     [name] 10/23/2011, 11:39 AM

## 2011-10-23 NOTE — Progress Notes (Signed)
Subjective: The patient says she had several loose stools last night after starting regular food. She denies loose stools this morning. She denies abdominal pain. She denies nausea vomiting.  Objective: Vital signs in last 24 hours: Filed Vitals:   10/23/11 1440 10/23/11 1500 10/23/11 1507 10/23/11 1519  BP: 96/64 96/64 106/73 95/64  Pulse: 96 96 98 99  Temp: 98.4 F (36.9 C) 98.4 F (36.9 C)  98.1 F (36.7 C)  TempSrc: Oral Oral  Oral  Resp: 18 18 18 18   Height:      Weight:  66.5 kg (146 lb 9.7 oz)    SpO2: 99% 99%  99%    Intake/Output Summary (Last 24 hours) at 10/23/11 1558 Last data filed at 10/23/11 1500  Gross per 24 hour  Intake 1427.5 ml  Output   2000 ml  Net -572.5 ml    Weight change: 0.618 kg (1 lb 5.8 oz)  Physical exam: General: 26 year old African-American woman sitting up in bed, in no acute distress. Lungs: Clear to auscultation bilaterally. Heart: S1, S2, with tachycardia. Chest wall: Dialysis catheter located at the left upper chest. No surrounding erythema or drainage. Nontender. Abdomen: Positive bowel sounds, soft, nontender, nondistended. Extremities: No pedal edema. New right upper extremity PICC line in place, no surrounding erythema or drainage. AV graft in left arm with palpable thrill.  Lab Results: Basic Metabolic Panel:  Basename 10/23/11 0527 10/22/11 0530 10/21/11 0910  NA 134* 135 --  K 4.1 3.9 --  CL 92* 94* --  CO2 26 28 --  GLUCOSE 150* 153* --  BUN 36* 24* --  CREATININE 7.41* 5.81* --  CALCIUM 9.1 9.2 --  MG -- -- --  PHOS -- 6.9* 8.6*   Liver Function Tests:  Basename 10/22/11 0530 10/21/11 0910 10/21/11 0004  AST 19 -- 44*  ALT 18 -- 25  ALKPHOS 129* -- 164*  BILITOT 0.4 -- 0.5  PROT 7.6 -- 8.6*  ALBUMIN 2.8* 3.3* --    Basename 10/21/11 0723  LIPASE 13  AMYLASE --   No results found for this basename: AMMONIA:2 in the last 72 hours CBC:  Basename 10/23/11 0527 10/22/11 0530  WBC 6.7 6.8  NEUTROABS --  --  HGB 10.8* 11.8*  HCT 33.0* 35.7*  MCV 95.4 95.2  PLT 231 209   Cardiac Enzymes: No results found for this basename: CKTOTAL:3,CKMB:3,CKMBINDEX:3,TROPONINI:3 in the last 72 hours BNP: No results found for this basename: PROBNP:3 in the last 72 hours D-Dimer: No results found for this basename: DDIMER:2 in the last 72 hours CBG:  Basename 10/23/11 1116 10/23/11 0727 10/22/11 2229 10/22/11 1703 10/22/11 1216 10/22/11 0734  GLUCAP 224* 174* 154* 167* 219* 163*   Hemoglobin A1C:  Basename 10/22/11 0950  HGBA1C 7.7*   Fasting Lipid Panel: No results found for this basename: CHOL,HDL,LDLCALC,TRIG,CHOLHDL,LDLDIRECT in the last 72 hours Thyroid Function Tests:  Basename 10/22/11 0950  TSH 1.452  T4TOTAL --  [name] --  T3FREE --  THYROIDAB --   Anemia Panel: No results found for this basename: VITAMINB12,FOLATE,FERRITIN,TIBC,IRON,RETICCTPCT in the last 72 hours Coagulation: No results found for this basename: LABPROT:2,INR:2 in the last 72 hours Urine Drug Screen: Drugs of Abuse     Component Value Date/Time   LABOPIA POSITIVE* 05/21/2009 2300   COCAINSCRNUR NONE DETECTED 05/21/2009 2300   LABBENZ NONE DETECTED 05/21/2009 2300   AMPHETMU NONE DETECTED 05/21/2009 2300   THCU NONE DETECTED 05/21/2009 2300   LABBARB  Value: NONE DETECTED  DRUG SCREEN FOR MEDICAL PURPOSES ONLY.  IF CONFIRMATION IS NEEDED FOR ANY PURPOSE, NOTIFY LAB WITHIN 5 DAYS.        LOWEST DETECTABLE LIMITS FOR URINE DRUG SCREEN Drug Class       Cutoff (ng/mL) Amphetamine      1000 Barbiturate      200 Benzodiazepine   200 Tricyclics       300 Opiates          300 Cocaine          300 THC              50 05/21/2009 2300    Alcohol Level: No results found for this basename: ETH:2 in the last 72 hours Urinalysis:  Basename 10/21/11 0305  COLORURINE YELLOW  LABSPEC 1.020  PHURINE 7.5  GLUCOSEU 250*  HGBUR LARGE*  BILIRUBINUR NEGATIVE  KETONESUR NEGATIVE  PROTEINUR >300*  UROBILINOGEN 0.2  NITRITE  NEGATIVE  LEUKOCYTESUR NEGATIVE   Misc. Labs:   Micro: Recent Results (from the past 240 hour(s))  CULTURE, BLOOD (ROUTINE X 2)     Status: Normal (Preliminary result)   Collection Time   10/21/11  4:27 AM      Component Value Range Status Comment   Specimen Description BLOOD RIGHT HAND   Final    Special Requests BOTTLES DRAWN AEROBIC ONLY 5CC   Final    Culture  Setup Time 10/22/2011 02:53   Final    Culture     Final    Value: STAPHYLOCOCCUS AUREUS     Note: RIFAMPIN AND GENTAMICIN SHOULD NOT BE USED AS SINGLE DRUGS FOR TREATMENT OF STAPH INFECTIONS.     Note: Gram Stain Report Called to,Read Back By and Verified With: [place] B @ [phone] BY FORSYTH K Performed at [facility]   Report Status PENDING   Incomplete   CULTURE, BLOOD (ROUTINE X 2)     Status: Normal (Preliminary result)   Collection Time   10/21/11  4:47 AM      Component Value Range Status Comment   Specimen Description BLOOD RIGHT ARM   Final    Special Requests BOTTLES DRAWN AEROBIC ONLY 5CC   Final    Culture  Setup Time 10/21/2011 21:45   Final    Culture     Final    Value: STAPHYLOCOCCUS AUREUS     Note: Gram Stain Report Called to,Read Back By and Verified With: Gean Quint AT [phone] BY FORSYTH K Performed at [facility]   Report Status PENDING   Incomplete   MRSA PCR SCREENING     Status: Normal   Collection Time   10/21/11  5:18 AM      Component Value Range Status Comment   MRSA by PCR NEGATIVE  NEGATIVE Final     Studies/Results: US Venous Img Lower Bilateral  10/21/2011  *RADIOLOGY REPORT*  Clinical Data: Bilateral leg pain.  Dialysis.  BILATERAL LOWER EXTREMITY VENOUS DUPLEX ULTRASOUND  Technique:  Gray-scale sonography with graded compression, as well as color Doppler and duplex ultrasound, were performed to evaluate the deep venous system of both lower extremities from the level of the common femoral vein through the popliteal and proximal calf veins.  Spectral Doppler was  utilized to evaluate flow at rest and with distal augmentation maneuvers.  Comparison:  12/24/2009.  05/17/2009.  Findings:  Normal compressibility of bilateral common femoral, superficial femoral, and popliteal veins is demonstrated, as well as the visualized proximal calf veins.  No  filling defects to suggest DVT on grayscale or color Doppler imaging.  Doppler waveforms show normal direction of venous flow, normal respiratory phasicity and response to augmentation.  Again noted is slight mural thrombus extending from the right femoral vein into the right common femoral vein and greater saphenous vein similar that seen on prior studies, representing chronic DVT.  IMPRESSION: No evidence of acute deep vein thrombosis in either lower extremity.   Original Report Authenticated By: Elsie Stain, M.D.    Dg Chest Port 1 View  10/22/2011  *RADIOLOGY REPORT*  Clinical Data: Right-sided PICC line placement.  PORTABLE CHEST - 1 VIEW  Comparison: Two-view chest 10/21/2011.  The  Findings: A left IJ dialysis catheter is stable.  New right-sided PICC line has been placed with the tip is at the cavoatrial junction.  Heart size is normal.  The lungs are clear.  IMPRESSION:  1.  Interval placement of right-sided PICC line.  The tip is at the cavoatrial junction. 2.  Stable left IJ dialysis catheter. 3.  No acute cardiopulmonary disease.   Original Report Authenticated By: Jamesetta Orleans. MATTERN, M.D.     Medications:  Scheduled:    . ciprofloxacin  400 mg Intravenous Q24H  . epoetin alfa  2,000 Units Intravenous 3 times weekly  . FLUoxetine  20 mg Oral Daily  . insulin aspart  0-5 Units Subcutaneous QHS  . insulin aspart  0-9 Units Subcutaneous TID WC  . labetalol  300 mg Oral TID  . multivitamin  1 tablet Oral Daily  . sevelamer  800 mg Oral TID WC  . sodium chloride  10-40 mL Intracatheter Q12H  . sodium chloride  3 mL Intravenous Q12H  . sodium chloride      . vancomycin  1,000 mg Intravenous Q M,W,F-HD     Continuous:  ZOX:WRUEAV chloride, heparin, heparin, HYDROmorphone (DILAUDID) injection, metoCLOPramide, ondansetron (ZOFRAN) IV, ondansetron, sodium chloride, sodium chloride  Assessment: Principal Problem:  *Nausea & vomiting Active Problems:  Sepsis  Acute pyelonephritis  Hyperkalemia  End stage renal disease  Abdominal pain  Fever  Sinus tachycardia  Medical non-compliance  Diabetes mellitus with nephropathy  Hypertension  Staphylococcus aureus bacteremia     1. Sepsis with staph arias bacteremia. The source is unknown, but will consider dialysis catheter infection and acute pyelonephritis as the potential etiologies. We'll continue Cipro and vancomycin until sensitivities are resulted. We'll need to discuss possible discontinuation of left chest wall dialysis catheter with nephrology. The patient has an AV graft in her left arm, but it is unknown when it can be used. The patient stated that it was placed approximately 3 weeks to 4 weeks ago.  Acute pyelonephritis. She is symptomatically improved. We'll continue Cipro. Urine culture ordered, but not resulted yet. It may not have been collected for culture.  Sinus tachycardia. Resolved. This may have been secondary to the patient's overall infectious process. She is currently afebrile. TSH is within normal limits.  Hypertension. Currently stable on labetalol.  Diabetes mellitus with nephropathy. Her blood sugars are reasonable. We'll continue sliding scale NovoLog. Hemoglobin A1c 7.7.  End stage renal disease. Per Dr. [name], the patient has a history of noncompliance. She is being dialyzed during hospitalization. Her BUN and creatinine are better. Her serum potassium has normalized.  Hyperkalemia secondary to end-stage renal disease. Status post Kayexalate and hemodialysis. Serum potassium now within normal limits.  Plan:  1. We'll need to discuss treatment of staph aureus bacteremia with nephrology. Perhaps nephrology  can shed light  on when the patient's current AV graft in her left arm can be used. The dialysis catheter may need to be removed, but this will need to be discussed with nephrology or possibly ID. 2..Check the results of the sensitivities of the staph aureus pending. 3. We'll check a 2-D echocardiogram in light of staph aureus bacteremia.     LOS: 3 days   [name] 10/23/2011, 3:58 PM

## 2011-10-24 ENCOUNTER — Telehealth (HOSPITAL_COMMUNITY): Payer: Self-pay

## 2011-10-24 DIAGNOSIS — E1029 Type 1 diabetes mellitus with other diabetic kidney complication: Secondary | ICD-10-CM

## 2011-10-24 DIAGNOSIS — A419 Sepsis, unspecified organism: Secondary | ICD-10-CM

## 2011-10-24 DIAGNOSIS — R7881 Bacteremia: Secondary | ICD-10-CM

## 2011-10-24 LAB — CULTURE, BLOOD (ROUTINE X 2)

## 2011-10-24 LAB — SURGICAL PCR SCREEN: Staphylococcus aureus: POSITIVE — AB

## 2011-10-24 LAB — GLUCOSE, CAPILLARY: Glucose-Capillary: 194 mg/dL — ABNORMAL HIGH (ref 70–99)

## 2011-10-24 MED ORDER — ACETAMINOPHEN 325 MG PO TABS
650.0000 mg | ORAL_TABLET | Freq: Four times a day (QID) | ORAL | Status: DC | PRN
  Administered 2011-10-25: 650 mg via ORAL
  Filled 2011-10-24: qty 2

## 2011-10-24 MED ORDER — HYDROMORPHONE HCL 2 MG PO TABS
2.0000 mg | ORAL_TABLET | ORAL | Status: DC | PRN
  Administered 2011-10-24 – 2011-10-26 (×7): 2 mg via ORAL
  Filled 2011-10-24 (×8): qty 1

## 2011-10-24 MED ORDER — INSULIN GLARGINE 100 UNIT/ML ~~LOC~~ SOLN
12.0000 [IU] | Freq: Every day | SUBCUTANEOUS | Status: DC
  Administered 2011-10-24: 12 [IU] via SUBCUTANEOUS

## 2011-10-24 MED ORDER — CLONIDINE HCL 0.1 MG PO TABS
0.1000 mg | ORAL_TABLET | Freq: Four times a day (QID) | ORAL | Status: DC | PRN
  Administered 2011-10-24 – 2011-10-29 (×3): 0.1 mg via ORAL
  Filled 2011-10-24 (×5): qty 1

## 2011-10-24 NOTE — Progress Notes (Signed)
ANTIBIOTIC CONSULT NOTE   Pharmacy Consult for Vancomycin and Cipro Indication: UTI, sepsis, staph bacteremia  Allergies  Allergen Reactions  . Ancef (Cefazolin Sodium) Shortness Of Breath    wheezing  . Coumadin (Warfarin Sodium)     Eyes bleed  . Morphine And Related Itching  . Sulfa Antibiotics Anaphylaxis and Shortness Of Breath  . Tomato Hives and Swelling   Patient Measurements: Height: 5\' 8"  (172.7 cm) Weight: 153 lb 14.1 oz (69.8 kg) IBW/kg (Calculated) : 63.9   Vital Signs: Temp: 98 F (36.7 C) (09/19 0606) Temp src: Oral (09/19 0606) BP: 120/77 mmHg (09/19 0606) Pulse Rate: 105  (09/19 0606) Intake/Output from previous day: 09/18 0701 - 09/19 0700 In: 1520.8 [P.O.:660; I.V.:660.8; IV Piggyback:200] Out: 2100 [Urine:100] Intake/Output from this shift: Total I/O In: 120 [P.O.:120] Out: -   Labs:  Basename 10/23/11 0527 10/22/11 0530  WBC 6.7 6.8  HGB 10.8* 11.8*  PLT 231 209  LABCREA -- --  CREATININE 7.41* 5.81*   Estimated Creatinine Clearance: 11.7 ml/min (by C-G formula based on Cr of 7.41). No results found for this basename: VANCOTROUGH:2,VANCOPEAK:2,VANCORANDOM:2,GENTTROUGH:2,GENTPEAK:2,GENTRANDOM:2,TOBRATROUGH:2,TOBRAPEAK:2,TOBRARND:2,AMIKACINPEAK:2,AMIKACINTROU:2,AMIKACIN:2, in the last 72 hours   Microbiology: Recent Results (from the past 720 hour(s))  CULTURE, BLOOD (ROUTINE X 2)     Status: Normal   Collection Time   10/21/11  4:27 AM      Component Value Range Status Comment   Specimen Description BLOOD RIGHT HAND   Final    Special Requests BOTTLES DRAWN AEROBIC ONLY 5CC   Final    Culture  Setup Time 10/22/2011 02:53   Final    Culture     Final    Value: STAPHYLOCOCCUS AUREUS     Note: RIFAMPIN AND GENTAMICIN SHOULD NOT BE USED AS SINGLE DRUGS FOR TREATMENT OF STAPH INFECTIONS.     Note: Gram Stain Report Called to,Read Back By and Verified With: [place] B @ [phone] BY FORSYTH K Performed at [facility]   Report Status  10/24/2011 FINAL   Final    Organism ID, Bacteria STAPHYLOCOCCUS AUREUS   Final   CULTURE, BLOOD (ROUTINE X 2)     Status: Normal   Collection Time   10/21/11  4:47 AM      Component Value Range Status Comment   Specimen Description BLOOD RIGHT ARM   Final    Special Requests BOTTLES DRAWN AEROBIC ONLY 5CC   Final    Culture  Setup Time 10/21/2011 21:45   Final    Culture     Final    Value: STAPHYLOCOCCUS AUREUS     Note: SUSCEPTIBILITIES PERFORMED ON PREVIOUS CULTURE WITHIN THE LAST 5 DAYS.     Note: Gram Stain Report Called to,Read Back By and Verified With: Gean Quint AT [phone] BY FORSYTH K Performed at [facility]   Report Status 10/24/2011 FINAL   Final   MRSA PCR SCREENING     Status: Normal   Collection Time   10/21/11  5:18 AM      Component Value Range Status Comment   MRSA by PCR NEGATIVE  NEGATIVE Final    Culture, blood (routine x 2) Status: Final result MyChart: Not Released        Newer results are available. Click to view them now.        Component  Value    Specimen Description  BLOOD RIGHT HAND    Special Requests  BOTTLES DRAWN AEROBIC ONLY 5CC    Culture Setup Time  10/22/2011 02:53    Culture  STAPHYLOCOCCUS AUREUS Note: RIFAMPIN AND GENTAMICIN SHOULD NOT BE USED AS SINGLE DRUGS FOR TREATMENT OF STAPH INFECTIONS. Note: Gram Stain Report Called to,Read Back By and Verified With: [place] B @ [phone] BY FORSYTH K Performed at [facility]    Report Status  10/24/2011 FINAL    Organism ID, Bacteria  STAPHYLOCOCCUS AUREUS    Resulting Agency  [facility]     Culture & Susceptibility     STAPHYLOCOCCUS AUREUS          Antibiotic  Sensitivity  Microscan  Status      CLINDAMYCIN  Resistant  >=8  Final      Method:  MIC      ERYTHROMYCIN  Resistant  >=8  Final      Method:  MIC      GENTAMICIN  Sensitive  <=0.5  Final      Method:  MIC      LEVOFLOXACIN  Sensitive  1  Final      Method:  MIC      MOXIFLOXACIN  Sensitive  <=0.25  Final       Method:  MIC      OXACILLIN  Sensitive  0.5  Final      Method:  MIC      PENICILLIN  Resistant  >=0.5  Final      Method:  MIC      RIFAMPIN  Sensitive  <=0.5  Final      Method:  MIC      TETRACYCLINE  Sensitive  <=1  Final      Method:  MIC      TRIMETH/SULFA  Sensitive  <=10  Final      Method:  MIC      VANCOMYCIN  Sensitive  1  Final      Method:  MIC       Comments  STAPHYLOCOCCUS AUREUS (MIC)        STAPHYLOCOCCUS AUREUS    Medical History: Past Medical History  Diagnosis Date  . Diabetes mellitus     insulin dependent diabetes mellitus with history of poor control  . Hypertension   . Hyperlipidemia   . Nephrotic syndrome   . Anemia     chronic,  Dr. Mariel Sleet,  Transfused, May, 2012  . 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 Hypertrophic cardiomyopathy     with diastolic heart dysfunction  . 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 Vaginosis   . History of MRSA infection     multiple  . Glaucoma   . Warfarin anticoagulation     DVT  . Ejection fraction     55%, echo 12/2009  . Manic depressive disorder   . Noncompliance   . port-a-cath   . Dialysis patient     M-W-F  . MRSA bacteremia   . Acute pyelonephritis 10/21/2011  . Medical non-compliance 10/22/2011  . Staphylococcus aureus bacteremia 10/23/2011   Medications:  Scheduled:     . ciprofloxacin  400 mg Intravenous Q24H  . epoetin alfa  2,000 Units Intravenous 3 times weekly  . FLUoxetine  20 mg Oral Daily  . insulin aspart  0-5 Units Subcutaneous QHS  . insulin aspart  0-9 Units Subcutaneous TID WC  . labetalol  300 mg Oral TID  . multivitamin  1 tablet Oral Daily  . sevelamer  800 mg Oral  TID WC  . sodium chloride  10-40 mL Intracatheter Q12H  . sodium chloride  3 mL Intravenous Q12H  . vancomycin  1,000 mg Intravenous Q M,W,F-HD   Assessment: 26yo female with ESRD requiring dialysis admitted with infectious process.  Asked to  manage Cipro and Vancomycin.  Culture report noted.  Staph bacteremia noted as above, sensitive to Vancomycin.  MD consulting with nephrology re: dialysis catheter and reportedly considering ID consult re: length of IV Rx. Estimated Creatinine Clearance: 11.7 ml/min (by C-G formula based on Cr of 7.41).  Goal of Therapy:  Vancomycin trough level 10-15 mcg/ml  Plan: Cipro 400mg  iv q24hrs (renally adjusted) Vancomycin 1gm if after each dialysis Check pre-dialysis level at steady state Await further input from nephrology and ID consult Monitor labs and cultures per protocol  Valrie Hart A 10/24/2011,10:37 AM

## 2011-10-24 NOTE — Telephone Encounter (Signed)
I spoke with Abby at [facility].  She was entering in the order for an HD cath placement.  I will schedule when the order appears.  I gave her the time slot of 12pm arrival for a 130 placement.

## 2011-10-24 NOTE — Progress Notes (Signed)
Subjective: Interval History: has no complaint of no complaints. Patient denies any difficulty increasing chief complaints of chronic leg pain and back pain..  Objective: Vital signs in last 24 hours: Temp:  [97.8 F (36.6 C)-98.4 F (36.9 C)] 98 F (36.7 C) (09/19 0606) Pulse Rate:  [92-105] 105  (09/19 0606) Resp:  [18] 18  (09/19 0606) BP: (91-146)/(60-104) 120/77 mmHg (09/19 0606) SpO2:  [99 %] 99 % (09/19 0606) Weight:  [66.5 kg (146 lb 9.7 oz)-69.8 kg (153 lb 14.1 oz)] 69.8 kg (153 lb 14.1 oz) (09/19 0500) Weight change: -1.218 kg (-2 lb 11 oz)  Intake/Output from previous day: 09/18 0701 - 09/19 0700 In: 1520.8 [P.O.:660; I.V.:660.8; IV Piggyback:200] Out: 2100 [Urine:100] Intake/Output this shift:    General appearance: alert, cooperative and no distress Resp: clear to auscultation bilaterally Cardio: regular rate and rhythm, S1, S2 normal, no murmur, click, rub or gallop GI: soft, non-tender; bowel sounds normal; no masses,  no organomegaly Extremities: extremities normal, atraumatic, no cyanosis or edema  Lab Results:  Basename 10/23/11 0527 10/22/11 0530  WBC 6.7 6.8  HGB 10.8* 11.8*  HCT 33.0* 35.7*  PLT 231 209   BMET:  Basename 10/23/11 0527 10/22/11 0530  NA 134* 135  K 4.1 3.9  CL 92* 94*  CO2 26 28  GLUCOSE 150* 153*  BUN 36* 24*  CREATININE 7.41* 5.81*  CALCIUM 9.1 9.2   No results found for this basename: PTH:2 in the last 72 hours Iron Studies: No results found for this basename: IRON,TIBC,TRANSFERRIN,FERRITIN in the last 72 hours  Studies/Results: Dg Chest Port 1 View  10/22/2011  *RADIOLOGY REPORT*  Clinical Data: Right-sided PICC line placement.  PORTABLE CHEST - 1 VIEW  Comparison: Two-view chest 10/21/2011.  The  Findings: A left IJ dialysis catheter is stable.  New right-sided PICC line has been placed with the tip is at the cavoatrial junction.  Heart size is normal.  The lungs are clear.  IMPRESSION:  1.  Interval placement of  right-sided PICC line.  The tip is at the cavoatrial junction. 2.  Stable left IJ dialysis catheter. 3.  No acute cardiopulmonary disease.   Original Report Authenticated By: Jamesetta Orleans. MATTERN, M.D.     I have reviewed the patient's current medications.  Assessment/Plan: Problem #1 end-stage renal disease she status post hemodialysis yesterday pending creatinine was in acceptable range normal potassium.  Problem #2 history of nausea vomiting and abdominal pain getting better Problem #3 history of diabetes Problem #4 history of hypertension blood pressure seems to be reasonably controlled as inpatient. Problem #5 history of staph aureus bacteremia. Patient had similar episode from before was found to have MRSA that time her dialysis catheter and also her MediPort was removed. Presently she has a staph aureus bacteremia. Patient is a febrile her white blood cell count is normal she is on vancomycin. She is also a maturing access. Problem #6 history of chronic pain syndrome Problem #7 history of hyperkalemia potassium is controlled. Plan: We'll ask surgery to remove her dialysis catheter today We'll make arrangements for her to make another access possibly tomorrow. We'll check her basic metabolic panel and phosphorus in the morning and also will dialyze her after her a new access placement.    LOS: 4 days   Carolynn Tuley S 10/24/2011,7:51 AM

## 2011-10-24 NOTE — Progress Notes (Addendum)
Chart reviewed.  Subjective: Complains of bilateral leg pain. No abdominal pain. No vomiting. Objective: Vital signs in last 24 hours: Filed Vitals:   10/23/11 1519 10/23/11 2101 10/24/11 0500 10/24/11 0606  BP: 95/64 102/70  120/77  Pulse: 99 103  105  Temp: 98.1 F (36.7 C) 97.8 F (36.6 C)  98 F (36.7 C)  TempSrc: Oral Oral  Oral  Resp: 18 18  18   Height:      Weight:   69.8 kg (153 lb 14.1 oz)   SpO2: 99% 99%  99%   Weight change: -1.218 kg (-2 lb 11 oz)  Intake/Output Summary (Last 24 hours) at 10/24/11 1219 Last data filed at 10/24/11 0800  Gross per 24 hour  Intake 1330.84 ml  Output   2100 ml  Net -769.16 ml   Normal: Comfortable. Eating lunch. HEENT right cornea cloudy Lungs clear to auscultation bilaterally without wheeze rhonchi or rales Cardiovascular regular rate rhythm without murmurs gallops rubs Abdomen soft nontender nondistended Extremities no clubbing cyanosis or edema  Lab Results: Basic Metabolic Panel:  Lab 10/23/11 2130 10/22/11 0530 10/21/11 0910  NA 134* 135 --  K 4.1 3.9 --  CL 92* 94* --  CO2 26 28 --  GLUCOSE 150* 153* --  BUN 36* 24* --  CREATININE 7.41* 5.81* --  CALCIUM 9.1 9.2 --  MG -- -- --  PHOS -- 6.9* 8.6*   Liver Function Tests:  Lab 10/22/11 0530 10/21/11 0910 10/21/11 0004  AST 19 -- 44*  ALT 18 -- 25  ALKPHOS 129* -- 164*  BILITOT 0.4 -- 0.5  PROT 7.6 -- 8.6*  ALBUMIN 2.8* 3.3* --    Lab 10/21/11 0723  LIPASE 13  AMYLASE --   No results found for this basename: AMMONIA:2 in the last 168 hours CBC:  Lab 10/23/11 0527 10/22/11 0530  WBC 6.7 6.8  NEUTROABS -- --  HGB 10.8* 11.8*  HCT 33.0* 35.7*  MCV 95.4 95.2  PLT 231 209   Cardiac Enzymes: No results found for this basename: CKTOTAL:3,CKMB:3,CKMBINDEX:3,TROPONINI:3 in the last 168 hours BNP: No results found for this basename: PROBNP:3 in the last 168 hours D-Dimer: No results found for this basename: DDIMER:2 in the last 168 hours CBG:  Lab  10/24/11 1134 10/24/11 0741 10/23/11 2059 10/23/11 1625 10/23/11 1116 10/23/11 0727  GLUCAP 256* 194* 328* 191* 224* 174*   Hemoglobin A1C:  Lab 10/22/11 0950  HGBA1C 7.7*   Fasting Lipid Panel: No results found for this basename: CHOL,HDL,LDLCALC,TRIG,CHOLHDL,LDLDIRECT in the last 865 hours Thyroid Function Tests:  Lab 10/23/11 0527 10/22/11 0950  TSH -- 1.452  T4TOTAL -- --  FREET4 1.03 --  T3FREE -- --  Marko Plume -- --   Coagulation: No results found for this basename: LABPROT:4,INR:4 in the last 168 hours Anemia Panel: No results found for this basename: VITAMINB12,FOLATE,FERRITIN,TIBC,IRON,RETICCTPCT in the last 168 hours Urine Drug Screen: Drugs of Abuse     Component Value Date/Time   LABOPIA POSITIVE* 05/21/2009 2300   COCAINSCRNUR NONE DETECTED 05/21/2009 2300   LABBENZ NONE DETECTED 05/21/2009 2300   AMPHETMU NONE DETECTED 05/21/2009 2300   THCU NONE DETECTED 05/21/2009 2300   LABBARB  Value: NONE DETECTED        DRUG SCREEN FOR MEDICAL PURPOSES ONLY.  IF CONFIRMATION IS NEEDED FOR ANY PURPOSE, NOTIFY LAB WITHIN 5 DAYS.        LOWEST DETECTABLE LIMITS FOR URINE DRUG SCREEN Drug Class       Cutoff (ng/mL) Amphetamine  1000 Barbiturate      200 Benzodiazepine   200 Tricyclics       300 Opiates          300 Cocaine          300 THC              50 05/21/2009 2300    Alcohol Level: No results found for this basename: ETH:2 in the last 168 hours Urinalysis:  Lab 10/21/11 0305  COLORURINE YELLOW  LABSPEC 1.020  PHURINE 7.5  GLUCOSEU 250*  HGBUR LARGE*  BILIRUBINUR NEGATIVE  KETONESUR NEGATIVE  PROTEINUR >300*  UROBILINOGEN 0.2  NITRITE NEGATIVE  LEUKOCYTESUR NEGATIVE   Micro Results: Recent Results (from the past 240 hour(s))  CULTURE, BLOOD (ROUTINE X 2)     Status: Normal   Collection Time   10/21/11  4:27 AM      Component Value Range Status Comment   Specimen Description BLOOD RIGHT HAND   Final    Special Requests BOTTLES DRAWN AEROBIC ONLY 5CC   Final     Culture  Setup Time 10/22/2011 02:53   Final    Culture     Final    Value: STAPHYLOCOCCUS AUREUS     Note: RIFAMPIN AND GENTAMICIN SHOULD NOT BE USED AS SINGLE DRUGS FOR TREATMENT OF STAPH INFECTIONS.     Note: Gram Stain Report Called to,Read Back By and Verified With: Guernsey B @ [phone] BY FORSYTH K Performed at [facility]   Report Status 10/24/2011 FINAL   Final    Organism ID, Bacteria STAPHYLOCOCCUS AUREUS   Final   CULTURE, BLOOD (ROUTINE X 2)     Status: Normal   Collection Time   10/21/11  4:47 AM      Component Value Range Status Comment   Specimen Description BLOOD RIGHT ARM   Final    Special Requests BOTTLES DRAWN AEROBIC ONLY 5CC   Final    Culture  Setup Time 10/21/2011 21:45   Final    Culture     Final    Value: STAPHYLOCOCCUS AUREUS     Note: SUSCEPTIBILITIES PERFORMED ON PREVIOUS CULTURE WITHIN THE LAST 5 DAYS.     Note: Gram Stain Report Called to,Read Back By and Verified With: Gean Quint AT [phone] BY FORSYTH K Performed at [facility]   Report Status 10/24/2011 FINAL   Final   MRSA PCR SCREENING     Status: Normal   Collection Time   10/21/11  5:18 AM      Component Value Range Status Comment   MRSA by PCR NEGATIVE  NEGATIVE Final    Studies/Results: Dg Chest Port 1 View  10/22/2011  *RADIOLOGY REPORT*  Clinical Data: Right-sided PICC line placement.  PORTABLE CHEST - 1 VIEW  Comparison: Two-view chest 10/21/2011.  The  Findings: A left IJ dialysis catheter is stable.  New right-sided PICC line has been placed with the tip is at the cavoatrial junction.  Heart size is normal.  The lungs are clear.  IMPRESSION:  1.  Interval placement of right-sided PICC line.  The tip is at the cavoatrial junction. 2.  Stable left IJ dialysis catheter. 3.  No acute cardiopulmonary disease.   Original Report Authenticated By: Jamesetta Orleans. MATTERN, M.D.    Scheduled Meds:   . ciprofloxacin  400 mg Intravenous Q24H  . epoetin alfa  2,000 Units  Intravenous 3 times weekly  . FLUoxetine  20 mg Oral Daily  . insulin aspart  0-5 Units Subcutaneous QHS  . insulin aspart  0-9 Units Subcutaneous TID WC  . labetalol  300 mg Oral TID  . multivitamin  1 tablet Oral Daily  . sevelamer  800 mg Oral TID WC  . sodium chloride  10-40 mL Intracatheter Q12H  . sodium chloride  3 mL Intravenous Q12H  . vancomycin  1,000 mg Intravenous Q M,W,F-HD   Continuous Infusions:   . sodium chloride 50 mL/hr at 10/23/11 1630   PRN Meds:.sodium chloride, heparin, heparin, HYDROmorphone (DILAUDID) injection, metoCLOPramide, ondansetron (ZOFRAN) IV, ondansetron, sodium chloride, sodium chloride, DISCONTD: sodium chloride, DISCONTD: sodium chloride Assessment/Plan: Principal Problem:  *Sepsis Active Problems:  Staphylococcus aureus bacteremia  End stage renal disease  UTI (urinary tract infection) due to Enterococcus  DM (diabetes mellitus), type 1 with renal complications  Hypertension  Hyperkalemia  Sinus tachycardia  Medical non-compliance  bilateral leg pain  Agree with d/c tunneled dialysis catheter, at it may be the source of infection. Staph aureus appears to be at a different strain from previous infection. This staph is methicillin sensitive. Previously, patient had MRSA bacteremia from an infected dialysis catheter. continue vancomycin. Followup echocardiogram results. May be able to stop Cipro, but will await urine culture first. Patient's symptoms have improved. Will discuss management with infectious disease.  Unclear what is causing the patient's leg pain. Doppler of the leg shows no new acute DVTs. Will check a CPK to evaluate for myositis. Could also be neuropathy.  Will start Lantus back at lower dose.   LOS: 4 days   [name] L 10/24/2011, 12:19 PM  Addendum:  Discussed with Dr. [name], ID.  he agrees with taking out the dialysis catheter. Patient will need at least 4 weeks of antibiotics for methicillin sensitive staph  aureus bacteremia. He also recommends transesophageal echocardiogram if transthoracic echo shows no vegetation.

## 2011-10-24 NOTE — Progress Notes (Signed)
Called consult to interventional radiology at [facility]. Requested HD catheter placement for tomorrow 9/20, if possible, per order. Notified that she would be a cross campus transfer, as far as I knew, because pt has no d/c instructions at this time. Interventional Rad says that she can arrive at 1200pm tomorrow (9/20), via [facility], they do need ancillary order for procedure, will notify physician. Sheryn Bison

## 2011-10-24 NOTE — Progress Notes (Signed)
Inpatient Diabetes Program Recommendations  AACE/ADA: New Consensus Statement on Inpatient Glycemic Control  Target Ranges:  Prepandial:   less than 140 mg/dL      Peak postprandial:   less than 180 mg/dL (1-2 hours)      Critically ill patients:  140 - 180 mg/dL  Pager:  [phone] Hours:  8 am-10pm   Reason for Visit: Elevated glucose:  Results for [name], [name] (MRN [id number]) as of 10/24/2011 10:37  Ref. Range 10/23/2011 07:27 10/23/2011 11:16 10/23/2011 16:25 10/23/2011 20:59 10/24/2011 07:41  Glucose-Capillary Latest Range: 70-99 mg/dL 284 (H) 132 (H) 440 (H) 328 (H) 194 (H)    Inpatient Diabetes Program Recommendations Insulin - Basal: Consider adding at least half dose of home Lantus:  Begin with Lantus 12 units daily  Alfredia Client PhD, RN, BC-ADM Diabetes Coordinator  Office:  [phone] Team Pager:  [phone]

## 2011-10-24 NOTE — Progress Notes (Signed)
Pt removed off of [place] [facility] interventional radiology schedule for tomorrow, at Dr. Carlynn Spry request. Consult placed for general surgery to remove HD catheter today. Sheryn Bison

## 2011-10-24 NOTE — Progress Notes (Signed)
*  PRELIMINARY RESULTS* Echocardiogram 2D Echocardiogram has been performed.  [name] 10/24/2011, 11:54 AM 

## 2011-10-25 ENCOUNTER — Inpatient Hospital Stay (HOSPITAL_COMMUNITY): Payer: Medicaid Other

## 2011-10-25 DIAGNOSIS — A4901 Methicillin susceptible Staphylococcus aureus infection, unspecified site: Secondary | ICD-10-CM

## 2011-10-25 DIAGNOSIS — B952 Enterococcus as the cause of diseases classified elsewhere: Secondary | ICD-10-CM

## 2011-10-25 DIAGNOSIS — H547 Unspecified visual loss: Secondary | ICD-10-CM | POA: Diagnosis present

## 2011-10-25 DIAGNOSIS — M79604 Pain in right leg: Secondary | ICD-10-CM | POA: Diagnosis present

## 2011-10-25 DIAGNOSIS — N390 Urinary tract infection, site not specified: Secondary | ICD-10-CM

## 2011-10-25 LAB — GLUCOSE, CAPILLARY
Glucose-Capillary: 199 mg/dL — ABNORMAL HIGH (ref 70–99)
Glucose-Capillary: 304 mg/dL — ABNORMAL HIGH (ref 70–99)

## 2011-10-25 LAB — BASIC METABOLIC PANEL
BUN: 42 mg/dL — ABNORMAL HIGH (ref 6–23)
CO2: 27 mEq/L (ref 19–32)
Chloride: 94 mEq/L — ABNORMAL LOW (ref 96–112)
Creatinine, Ser: 7.4 mg/dL — ABNORMAL HIGH (ref 0.50–1.10)
Glucose, Bld: 216 mg/dL — ABNORMAL HIGH (ref 70–99)
Potassium: 3.8 mEq/L (ref 3.5–5.1)

## 2011-10-25 LAB — CBC
HCT: 34.6 % — ABNORMAL LOW (ref 36.0–46.0)
Hemoglobin: 11.3 g/dL — ABNORMAL LOW (ref 12.0–15.0)
MCH: 31.1 pg (ref 26.0–34.0)
MCHC: 32.7 g/dL (ref 30.0–36.0)
MCV: 95.3 fL (ref 78.0–100.0)
RDW: 15.5 % (ref 11.5–15.5)

## 2011-10-25 MED ORDER — INSULIN GLARGINE 100 UNIT/ML ~~LOC~~ SOLN
16.0000 [IU] | Freq: Every day | SUBCUTANEOUS | Status: DC
  Administered 2011-10-25 – 2011-10-27 (×3): 16 [IU] via SUBCUTANEOUS

## 2011-10-25 MED ORDER — LIDOCAINE HCL (PF) 1 % IJ SOLN
5.0000 mL | Freq: Once | INTRAMUSCULAR | Status: AC
  Administered 2011-10-25: 5 mL via INTRADERMAL
  Filled 2011-10-25: qty 5

## 2011-10-25 NOTE — Progress Notes (Signed)
Inpatient Diabetes Program Recommendations  AACE/ADA: New Consensus Statement on Inpatient Glycemic Control (2013)  Target Ranges:  Prepandial:   less than 140 mg/dL      Peak postprandial:   less than 180 mg/dL (1-2 hours)      Critically ill patients:  140 - 180 mg/dL   Results for DESIA, CAPP (MRN [id number]) as of 10/25/2011 08:21  Ref. Range 10/24/2011 07:41 10/24/2011 11:34 10/24/2011 16:52 10/24/2011 20:52  Glucose-Capillary Latest Range: 70-99 mg/dL 629 (H) 528 (H) 413 (H) 215 (H)   PO intake 09/19: 100% breakfast/ 75% lunch/ 25% dinner Postprandial CBGs also elevated.  Inpatient Diabetes Program Recommendations Insulin - Basal: Noted 1/2 home dose Lantus started last night. Insulin - Meal Coverage: Please consider adding low dose meal coverage- Novolog 3 units tid with meals.  Note: Will follow. Ambrose Finland RN, MSN, CDE Diabetes Coordinator Inpatient Diabetes Program [phone]

## 2011-10-25 NOTE — Progress Notes (Signed)
Initiated dialysis tx without difficulty via LUA AVG. 15 gax1" up/down. First use of new graft. Outpatient plan of care on chart / reviewed

## 2011-10-25 NOTE — Progress Notes (Signed)
Paged on call MD with positive staph from surgical PCR.  MD has not returned my page.  Will page MD again and inform the oncoming nurse.

## 2011-10-25 NOTE — Evaluation (Signed)
Physical Therapy Evaluation Patient Details Name: [name] MRN: [id number] DOB: 07/16/1985 Today's Date: 10/25/2011 Time: 2952-8413 PT Time Calculation (min): 45 min  PT Assessment / Plan / Recommendation Clinical Impression  Pt is seen for eval.  She lives with her boyfriend who is employed, but usually has sommeone at home with her.  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  She has good strength throughout, but describes nearly constant stabbing pain in both legs (entire extremity).  The pain is stabbing.   However, during the eval, she did not appear to have any antalgia and had no sensory loss in her feet that I could detect on perfunctory exam.  She had no intrinsic gait instability with a cane and was limited primarily by her blindness.  It seems that this "stabbing" pain is what causes her to feel unstable with gait at times.  I will ask nursing service to ambulate with her while in house.        PT Assessment  Patent does not need any further PT services    Follow Up Recommendations  No PT follow up    Barriers to Discharge        Equipment Recommendations  None recommended by PT    Recommendations for Other Services     Frequency      Precautions / Restrictions Precautions Precautions: Fall Precaution Comments: pt is blind...reason for fall precaution Restrictions Weight Bearing Restrictions: No   Pertinent Vitals/Pain       Mobility  Bed Mobility Bed Mobility: Supine to Sit;Sit to Supine Supine to Sit: 6: Modified independent (Device/Increase time) Sit to Supine: 6: Modified independent (Device/Increase time) Transfers Transfers: Sit to Stand;Stand to Sit Sit to Stand: 5: Supervision Stand to Sit: 5: Supervision Ambulation/Gait Ambulation/Gait Assistance: 5: Supervision Ambulation Distance (Feet): 40 Feet Assistive device: Straight cane Ambulation/Gait Assistance Details: Pt needs guidance because of her blindness.  I did not see any inherent gait  instability...in her own home, she should not have any difficulty with gait. Gait Pattern: Within Functional Limits Gait velocity: slow, due to blindness Stairs: No Wheelchair Mobility Wheelchair Mobility: No    Exercises     PT Diagnosis:    PT Problem List:   PT Treatment Interventions:     PT Goals    Visit Information  Last PT Received On: 10/25/11    Subjective Data  Subjective: sometimes my legs feel weak Patient Stated Goal: none stated   Prior Functioning  Home Living Lives With: Significant other Available Help at Discharge: Friend(s);Available PRN/intermittently Type of Home: House Home Access: Level entry Home Layout: One level Bathroom Toilet: Standard Home Adaptive Equipment: Straight cane (cane for the blind) Prior Function Able to Take Stairs?: Yes Driving: No Vocation: On disability Communication Communication: No difficulties    Cognition  Overall Cognitive Status: Appears within functional limits for tasks assessed/performed Arousal/Alertness: Awake/alert Orientation Level: Appears intact for tasks assessed Behavior During Session: [facility] for tasks performed    Extremity/Trunk Assessment Right Lower Extremity Assessment RLE ROM/Strength/Tone: Within functional levels RLE Sensation: WFL - Light Touch RLE Coordination: WFL - gross motor Left Lower Extremity Assessment LLE ROM/Strength/Tone: Within functional levels LLE Sensation: WFL - Light Touch LLE Coordination: WFL - gross motor   Balance Balance Balance Assessed:  (WNL by functional observation)  End of Session PT - End of Session Equipment Utilized During Treatment: Gait belt Activity Tolerance: Patient tolerated treatment well Patient left: in chair;with call bell/phone within reach;with chair alarm set Nurse  Communication: Mobility status  GP     Konrad Penta 10/25/2011, 10:30 AM

## 2011-10-25 NOTE — Progress Notes (Signed)
Subjective: Interval History: has no complaint of . She denies any difficulty breathing she complains of leg pain and back pain..  Objective: Vital signs in last 24 hours: Temp:  [97.8 F (36.6 C)-98.5 F (36.9 C)] 98.5 F (36.9 C) (09/20 0603) Pulse Rate:  [93-100] 93  (09/20 0603) Resp:  [18-20] 20  (09/20 0603) BP: (162-196)/(98-136) 166/106 mmHg (09/20 0603) SpO2:  [97 %-100 %] 100 % (09/20 0603) Weight:  [73 kg (160 lb 15 oz)] 73 kg (160 lb 15 oz) (09/20 0500) Weight change: 4.5 kg (9 lb 14.7 oz)  Intake/Output from previous day: 09/19 0701 - 09/20 0700 In: 1776.7 [P.O.:620; I.V.:1156.7] Out: 300 [Urine:300] Intake/Output this shift:    General appearance: alert, cooperative and no distress Resp: clear to auscultation bilaterally Cardio: regular rate and rhythm, S1, S2 normal, no murmur, click, rub or gallop GI: soft, non-tender; bowel sounds normal; no masses,  no organomegaly Extremities: extremities normal, atraumatic, no cyanosis or edema  Lab Results:  Basename 10/25/11 0518 10/23/11 0527  WBC 5.6 6.7  HGB 11.3* 10.8*  HCT 34.6* 33.0*  PLT 267 231   BMET:  Basename 10/25/11 0518 10/23/11 0527  NA 133* 134*  K 3.8 4.1  CL 94* 92*  CO2 27 26  GLUCOSE 216* 150*  BUN 42* 36*  CREATININE 7.40* 7.41*  CALCIUM 9.4 9.1   No results found for this basename: PTH:2 in the last 72 hours Iron Studies: No results found for this basename: IRON,TIBC,TRANSFERRIN,FERRITIN in the last 72 hours  Studies/Results: No results found.  I have reviewed the patient's current medications.  Assessment/Plan: Problem #1 end-stage renal disease she status post hemodialysis on Wednesday her BUN is 42 creatinine 7.40 with potassium of 3.8 stable.  Problem #2 anemia her hemoglobin is 11.3 hematocrit 34.6. Her problem #3 history of hyperkalemia potassium has corrected Problem #4 history of diabetes Problem #5 history of hypertension her blood pressure seems to be controlled very  well. Problem #6 history of staph aureus bacteremia patient on antibiotics his a febrile white blood cell count is normal. This moment we are waiting for surgery to her dialysis catheter. Patient has a graft to the left and which has been' to 4 weeks and discussed was vascular surgeon he told us her to use the rgraft Plan: We'll do dialysis today using her graft. Possibly the catheter will be removed today.     LOS: 5 days   Hanadi Stanly S 10/25/2011,7:42 AM

## 2011-10-25 NOTE — Evaluation (Signed)
Physical Therapy Evaluation Patient Details Name: [name] MRN: [id number] DOB: 03/16/1985 Today's Date: 10/25/2011 Time: 0931-1016 PT Time Calculation (min): 45 min  PT Assessment / Plan / Recommendation Clinical Impression  Pt is seen for eval.  She lives with her boyfriend who is employed, but usually has sommeone at home with her.  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  She has good strength throughout, but describes nearly constant stabbing pain in both legs (entire extremity).  The pain is stabbing.   However, during the eval, she did not appear to have any antalgia and had no sensory loss in her feet that I could detect on perfunctory exam.  She had no intrinsic gait instability with a cane and was limited primarily by her blindness.  It seems that this "stabbing" pain is what causes her to feel unstable with gait at times.  I will ask nursing service to ambulate with her while in house.        PT Assessment  Patent does not need any further PT services    Follow Up Recommendations  No PT follow up    Barriers to Discharge        Equipment Recommendations  None recommended by PT    Recommendations for Other Services     Frequency      Precautions / Restrictions Precautions Precautions: Fall Precaution Comments: pt is blind...reason for fall precaution Restrictions Weight Bearing Restrictions: No   Pertinent Vitals/Pain       Mobility  Bed Mobility Bed Mobility: Supine to Sit;Sit to Supine Supine to Sit: 6: Modified independent (Device/Increase time) Sit to Supine: 6: Modified independent (Device/Increase time) Transfers Transfers: Sit to Stand;Stand to Sit Sit to Stand: 5: Supervision Stand to Sit: 5: Supervision Ambulation/Gait Ambulation/Gait Assistance: 5: Supervision Ambulation Distance (Feet): 40 Feet Assistive device: Straight cane Ambulation/Gait Assistance Details: Pt needs guidance because of her blindness.  I did not see any inherent gait  instability...in her own home, she should not have any difficulty with gait. Gait Pattern: Within Functional Limits Gait velocity: slow, due to blindness Stairs: No Wheelchair Mobility Wheelchair Mobility: No    Exercises     PT Diagnosis:    PT Problem List:   PT Treatment Interventions:     PT Goals    Visit Information  Last PT Received On: 10/25/11    Subjective Data  Subjective: sometimes my legs feel weak Patient Stated Goal: none stated   Prior Functioning  Home Living Lives With: Significant other Available Help at Discharge: Friend(s);Available PRN/intermittently Type of Home: House Home Access: Level entry Home Layout: One level Bathroom Toilet: Standard Home Adaptive Equipment: Straight cane (cane for the blind) Prior Function Able to Take Stairs?: Yes Driving: No Vocation: On disability Communication Communication: No difficulties    Cognition  Overall Cognitive Status: Appears within functional limits for tasks assessed/performed Arousal/Alertness: Awake/alert Orientation Level: Appears intact for tasks assessed Behavior During Session: WFL for tasks performed    Extremity/Trunk Assessment Right Lower Extremity Assessment RLE ROM/Strength/Tone: Within functional levels RLE Sensation: WFL - Light Touch RLE Coordination: WFL - gross motor Left Lower Extremity Assessment LLE ROM/Strength/Tone: Within functional levels LLE Sensation: WFL - Light Touch LLE Coordination: WFL - gross motor   Balance Balance Balance Assessed:  (WNL by functional observation)  End of Session PT - End of Session Equipment Utilized During Treatment: Gait belt Activity Tolerance: Patient tolerated treatment well Patient left: in chair;with call bell/phone within reach;with chair alarm set Nurse   Communication: Mobility status  GP     Fuquan Wilson L 10/25/2011, 10:30 AM   

## 2011-10-25 NOTE — Progress Notes (Signed)
Discussed with Dr. [name] and [facility]  Subjective: Feels tired. No vomiting.   Objective: Vital signs in last 24 hours: Filed Vitals:   10/25/11 1150 10/25/11 1151 10/25/11 1220 10/25/11 1250  BP: 145/101 154/105 136/85 134/92  Pulse: 98 97 91 98  Temp: 98.3 F (36.8 C)     TempSrc: Oral     Resp: 18 18 18 18   Height:      Weight:      SpO2: 100%      Weight change: 4.5 kg (9 lb 14.7 oz)  Intake/Output Summary (Last 24 hours) at 10/25/11 1305 Last data filed at 10/25/11 0918  Gross per 24 hour  Intake 1786.67 ml  Output    300 ml  Net 1486.67 ml   seen in dialysis  HEENT eyes closed. Lungs clear to auscultation bilaterally without wheeze rhonchi or rales Cardiovascular regular rate rhythm without murmurs gallops rubs Abdomen soft nontender nondistended Extremities no clubbing cyanosis or edema  Lab Results: Basic Metabolic Panel:  Lab 10/25/11 1610 10/23/11 0527 10/22/11 0530 10/21/11 0910  NA 133* 134* -- --  K 3.8 4.1 -- --  CL 94* 92* -- --  CO2 27 26 -- --  GLUCOSE 216* 150* -- --  BUN 42* 36* -- --  CREATININE 7.40* 7.41* -- --  CALCIUM 9.4 9.1 -- --  MG -- -- -- --  PHOS -- -- 6.9* 8.6*   Liver Function Tests:  Lab 10/22/11 0530 10/21/11 0910 10/21/11 0004  AST 19 -- 44*  ALT 18 -- 25  ALKPHOS 129* -- 164*  BILITOT 0.4 -- 0.5  PROT 7.6 -- 8.6*  ALBUMIN 2.8* 3.3* --    Lab 10/21/11 0723  LIPASE 13  AMYLASE --   No results found for this basename: AMMONIA:2 in the last 168 hours CBC:  Lab 10/25/11 0518 10/23/11 0527  WBC 5.6 6.7  NEUTROABS -- --  HGB 11.3* 10.8*  HCT 34.6* 33.0*  MCV 95.3 95.4  PLT 267 231   Cardiac Enzymes:  Lab 10/25/11 0518  CKTOTAL 70  CKMB --  CKMBINDEX --  TROPONINI --   BNP: No results found for this basename: PROBNP:3 in the last 168 hours D-Dimer: No results found for this basename: DDIMER:2 in the last 168 hours CBG:  Lab 10/25/11 0740 10/24/11 2052 10/24/11 1652 10/24/11 1134  10/24/11 0741 10/23/11 2059  GLUCAP 199* 215* 235* 256* 194* 328*   Hemoglobin A1C:  Lab 10/22/11 0950  HGBA1C 7.7*   Fasting Lipid Panel: No results found for this basename: CHOL,HDL,LDLCALC,TRIG,CHOLHDL,LDLDIRECT in the last 960 hours Thyroid Function Tests:  Lab 10/23/11 0527 10/22/11 0950  TSH -- 1.452  T4TOTAL -- --  FREET4 1.03 --  T3FREE -- --  [name] -- --   Coagulation: No results found for this basename: LABPROT:4,INR:4 in the last 168 hours Anemia Panel: No results found for this basename: VITAMINB12,FOLATE,FERRITIN,TIBC,IRON,RETICCTPCT in the last 168 hours Urine Drug Screen: Drugs of Abuse     Component Value Date/Time   LABOPIA POSITIVE* 05/21/2009 2300   COCAINSCRNUR NONE DETECTED 05/21/2009 2300   LABBENZ NONE DETECTED 05/21/2009 2300   AMPHETMU NONE DETECTED 05/21/2009 2300   THCU NONE DETECTED 05/21/2009 2300   LABBARB  Value: NONE DETECTED        DRUG SCREEN FOR MEDICAL PURPOSES ONLY.  IF CONFIRMATION IS NEEDED FOR ANY PURPOSE, NOTIFY LAB WITHIN 5 DAYS.        LOWEST DETECTABLE LIMITS FOR URINE DRUG SCREEN Drug Class  Cutoff (ng/mL) Amphetamine      1000 Barbiturate      200 Benzodiazepine   200 Tricyclics       300 Opiates          300 Cocaine          300 THC              50 05/21/2009 2300    Alcohol Level: No results found for this basename: ETH:2 in the last 168 hours Urinalysis:  Lab 10/21/11 0305  COLORURINE YELLOW  LABSPEC 1.020  PHURINE 7.5  GLUCOSEU 250*  HGBUR LARGE*  BILIRUBINUR NEGATIVE  KETONESUR NEGATIVE  PROTEINUR >300*  UROBILINOGEN 0.2  NITRITE NEGATIVE  LEUKOCYTESUR NEGATIVE   Micro Results: Recent Results (from the past 240 hour(s))  CULTURE, BLOOD (ROUTINE X 2)     Status: Normal   Collection Time   10/21/11  4:27 AM      Component Value Range Status Comment   Specimen Description BLOOD RIGHT HAND   Final    Special Requests BOTTLES DRAWN AEROBIC ONLY 5CC   Final    Culture  Setup Time 10/22/2011 02:53   Final     Culture     Final    Value: STAPHYLOCOCCUS AUREUS     Note: RIFAMPIN AND GENTAMICIN SHOULD NOT BE USED AS SINGLE DRUGS FOR TREATMENT OF STAPH INFECTIONS.     Note: Gram Stain Report Called to,Read Back By and Verified With: Cheval B @ [phone] BY FORSYTH K Performed at [facility]   Report Status 10/24/2011 FINAL   Final    Organism ID, Bacteria STAPHYLOCOCCUS AUREUS   Final   CULTURE, BLOOD (ROUTINE X 2)     Status: Normal   Collection Time   10/21/11  4:47 AM      Component Value Range Status Comment   Specimen Description BLOOD RIGHT ARM   Final    Special Requests BOTTLES DRAWN AEROBIC ONLY 5CC   Final    Culture  Setup Time 10/21/2011 21:45   Final    Culture     Final    Value: STAPHYLOCOCCUS AUREUS     Note: SUSCEPTIBILITIES PERFORMED ON PREVIOUS CULTURE WITHIN THE LAST 5 DAYS.     Note: Gram Stain Report Called to,Read Back By and Verified With: Gean Quint AT [phone] BY FORSYTH K Performed at [facility]   Report Status 10/24/2011 FINAL   Final   MRSA PCR SCREENING     Status: Normal   Collection Time   10/21/11  5:18 AM      Component Value Range Status Comment   MRSA by PCR NEGATIVE  NEGATIVE Final   URINE CULTURE     Status: Normal (Preliminary result)   Collection Time   10/23/11  6:30 PM      Component Value Range Status Comment   Specimen Description URINE, CLEAN CATCH   Final    Special Requests NONE   Final    Culture  Setup Time 10/24/2011 01:34   Final    Colony Count >=100,000 COLONIES/ML   Final    Culture ENTEROCOCCUS SPECIES   Final    Report Status PENDING   Incomplete   SURGICAL PCR SCREEN     Status: Abnormal   Collection Time   10/24/11  9:26 PM      Component Value Range Status Comment   MRSA, PCR NEGATIVE  NEGATIVE Final    Staphylococcus aureus POSITIVE (*) NEGATIVE Final  Studies/Results: No results found. Scheduled Meds:    . epoetin alfa  2,000 Units Intravenous 3 times weekly  . FLUoxetine  20 mg Oral Daily  .  insulin aspart  0-5 Units Subcutaneous QHS  . insulin aspart  0-9 Units Subcutaneous TID WC  . insulin glargine  12 Units Subcutaneous QHS  . labetalol  300 mg Oral TID  . lidocaine  5 mL Intradermal Once  . multivitamin  1 tablet Oral Daily  . sevelamer  800 mg Oral TID WC  . sodium chloride  10-40 mL Intracatheter Q12H  . sodium chloride  3 mL Intravenous Q12H  . vancomycin  1,000 mg Intravenous Q M,W,F-HD  . DISCONTD: ciprofloxacin  400 mg Intravenous Q24H   Continuous Infusions:    . sodium chloride 50 mL/hr at 10/23/11 1630   PRN Meds:.sodium chloride, acetaminophen, cloNIDine, heparin, heparin, HYDROmorphone, metoCLOPramide, ondansetron (ZOFRAN) IV, ondansetron, sodium chloride, sodium chloride Assessment/Plan: Principal Problem:  *Sepsis Active Problems:  Staphylococcus aureus bacteremia  End stage renal disease  UTI (urinary tract infection) due to Enterococcus  DM (diabetes mellitus), type 1 with renal complications  Hypertension  Sinus tachycardia  Medical non-compliance  Leg pain, bilateral  Blindness  bilateral leg pain  Tunneled dialysis catheter out. Patient currently being dialyzed from her AV graft. Discussed with cardiology. They will try to set up a transesophageal echocardiogram Monday. Will repeat blood cultures tomorrow. CPK is normal, ruling out myositis or rhabdomyolysis as the cause of patient's leg pain. Could be from neuropathy. She did well with physical therapy and has no weakness. Urine culture sensitivities are pending. Continue vancomycin. Stop Cipro. Increase Lantus.   LOS: 5 days   [name] L 10/25/2011, 1:05 PM

## 2011-10-25 NOTE — CV Procedure (Signed)
Procedure.  Removal of HD catheter.  Tunneled.  Site prepped.  Local instilled (1% lidocaine).  Cuff located and freed from surrounding soft tissue.  Catheter removed without difficulty.  Pressure held for ~46min.   Dressing placed.

## 2011-10-25 NOTE — Progress Notes (Signed)
Consult for TEE received. Discussed with Dr. Eden Emms, unable to do today given schedule. Will defer until Monday - also discussed with IM Dr. Lendell Caprice. No active CV issues. Will defer ?need for formal consult until Monday given that it is unclear whether or not she may just require TEE. Pt will be made NPO after midnight going into Monday per Dr. Lendell Caprice and our office will work to schedule this. Ardell Aaronson PA-C

## 2011-10-26 DIAGNOSIS — M79609 Pain in unspecified limb: Secondary | ICD-10-CM

## 2011-10-26 LAB — GLUCOSE, CAPILLARY
Glucose-Capillary: 101 mg/dL — ABNORMAL HIGH (ref 70–99)
Glucose-Capillary: 126 mg/dL — ABNORMAL HIGH (ref 70–99)
Glucose-Capillary: 234 mg/dL — ABNORMAL HIGH (ref 70–99)

## 2011-10-26 MED ORDER — GABAPENTIN 300 MG PO CAPS
300.0000 mg | ORAL_CAPSULE | Freq: Two times a day (BID) | ORAL | Status: DC
  Administered 2011-10-26 – 2011-10-30 (×9): 300 mg via ORAL
  Filled 2011-10-26 (×9): qty 1

## 2011-10-26 MED ORDER — HYDROMORPHONE HCL 4 MG PO TABS
4.0000 mg | ORAL_TABLET | ORAL | Status: DC | PRN
  Administered 2011-10-26 (×2): 4 mg via ORAL
  Filled 2011-10-26 (×2): qty 1

## 2011-10-26 NOTE — Progress Notes (Signed)
Subjective: Interval History: has no complaint of nausea or vomiting. She denies any difficulty in breathing. Her pain is also seems to be easing..  Objective: Vital signs in last 24 hours: Temp:  [98 F (36.7 C)-98.4 F (36.9 C)] 98 F (36.7 C) (09/21 0408) Pulse Rate:  [91-101] 93  (09/21 0408) Resp:  [18-20] 20  (09/21 0408) BP: (122-154)/(83-105) 139/88 mmHg (09/21 0408) SpO2:  [97 %-100 %] 97 % (09/21 0408) Weight:  [66 kg (145 lb 8.1 oz)-69.945 kg (154 lb 3.2 oz)] 69.945 kg (154 lb 3.2 oz) (09/21 0408) Weight change: -4.5 kg (-9 lb 14.7 oz)  Intake/Output from previous day: 09/20 0701 - 09/21 0700 In: 920 [P.O.:910; I.V.:10] Out: 2500  Intake/Output this shift:    General appearance: alert, cooperative and no distress Resp: clear to auscultation bilaterally Cardio: regular rate and rhythm, S1, S2 normal, no murmur, click, rub or gallop GI: soft, non-tender; bowel sounds normal; no masses,  no organomegaly Extremities: extremities normal, atraumatic, no cyanosis or edema  Lab Results:  Basename 10/25/11 0518  WBC 5.6  HGB 11.3*  HCT 34.6*  PLT 267   BMET:  Basename 10/25/11 0518  NA 133*  K 3.8  CL 94*  CO2 27  GLUCOSE 216*  BUN 42*  CREATININE 7.40*  CALCIUM 9.4   No results found for this basename: PTH:2 in the last 72 hours Iron Studies: No results found for this basename: IRON,TIBC,TRANSFERRIN,FERRITIN in the last 72 hours  Studies/Results: No results found.  I have reviewed the patient's current medications.  Assessment/Plan: Problem #1 end-stage renal disease she status post hemodialysis yesterday her BUN 42 creatinine 7.4 with potassium 3.8. Problem #2 anemia her hemoglobin is 11.3 hematocrit 34.6 on Epogen and stable. Problem #3 history of staph aureus bacteremia patient presently on antibiotics catheter is removed. And patient is a febrile with normal white blood cell count. Problem #4 hypertension blood pressure seems to be reasonably  controlled Problem #5 history of diabetes Problem #6 history of chronic pain syndrome Problem #7 history of diabetic retinopathy and patient is a was poor vision.  Plan: We'll check her basic metabolic panel, phosphorus in the morning.   LOS: 6 days   Peytan Andringa S 10/26/2011,10:52 AM

## 2011-10-26 NOTE — Progress Notes (Signed)
Subjective: C/o leg pain "for a while".  Wants IV meds  Objective: Vital signs in last 24 hours: Filed Vitals:   10/25/11 1537 10/25/11 1610 10/25/11 2225 10/26/11 0408  BP: 145/88 140/94 122/85 139/88  Pulse: 100 99 98 93  Temp:   98.4 F (36.9 C) 98 F (36.7 C)  TempSrc:   Oral Oral  Resp: 20 20 20 20   Height:      Weight:    69.945 kg (154 lb 3.2 oz)  SpO2:   99% 97%   Weight change: -4.5 kg (-9 lb 14.7 oz)  Intake/Output Summary (Last 24 hours) at 10/26/11 1241 Last data filed at 10/25/11 2331  Gross per 24 hour  Intake    670 ml  Output   2500 ml  Net  -1830 ml   Gen: in chair eating lunch Lungs clear to auscultation bilaterally without wheeze rhonchi or rales Cardiovascular regular rate rhythm without murmurs gallops rubs Abdomen soft nontender nondistended Extremities no clubbing cyanosis or edema  Lab Results: Basic Metabolic Panel:  Lab 10/25/11 4098 10/23/11 0527 10/22/11 0530 10/21/11 0910  NA 133* 134* -- --  K 3.8 4.1 -- --  CL 94* 92* -- --  CO2 27 26 -- --  GLUCOSE 216* 150* -- --  BUN 42* 36* -- --  CREATININE 7.40* 7.41* -- --  CALCIUM 9.4 9.1 -- --  MG -- -- -- --  PHOS -- -- 6.9* 8.6*   Liver Function Tests:  Lab 10/22/11 0530 10/21/11 0910 10/21/11 0004  AST 19 -- 44*  ALT 18 -- 25  ALKPHOS 129* -- 164*  BILITOT 0.4 -- 0.5  PROT 7.6 -- 8.6*  ALBUMIN 2.8* 3.3* --    Lab 10/21/11 0723  LIPASE 13  AMYLASE --   No results found for this basename: AMMONIA:2 in the last 168 hours CBC:  Lab 10/25/11 0518 10/23/11 0527  WBC 5.6 6.7  NEUTROABS -- --  HGB 11.3* 10.8*  HCT 34.6* 33.0*  MCV 95.3 95.4  PLT 267 231   Cardiac Enzymes:  Lab 10/25/11 0518  CKTOTAL 70  CKMB --  CKMBINDEX --  TROPONINI --   BNP: No results found for this basename: PROBNP:3 in the last 168 hours D-Dimer: No results found for this basename: DDIMER:2 in the last 168 hours CBG:  Lab 10/26/11 1112 10/26/11 0736 10/25/11 2053 10/25/11 1703 10/25/11  0740 10/24/11 2052  GLUCAP 126* 101* 304* 229* 199* 215*   Hemoglobin A1C:  Lab 10/22/11 0950  HGBA1C 7.7*   Fasting Lipid Panel: No results found for this basename: CHOL,HDL,LDLCALC,TRIG,CHOLHDL,LDLDIRECT in the last 119 hours Thyroid Function Tests:  Lab 10/23/11 0527 10/22/11 0950  TSH -- 1.452  T4TOTAL -- --  FREET4 1.03 --  T3FREE -- --  Marko Plume -- --   Coagulation: No results found for this basename: LABPROT:4,INR:4 in the last 168 hours Anemia Panel: No results found for this basename: VITAMINB12,FOLATE,FERRITIN,TIBC,IRON,RETICCTPCT in the last 168 hours Urine Drug Screen: Drugs of Abuse     Component Value Date/Time   LABOPIA POSITIVE* 05/21/2009 2300   COCAINSCRNUR NONE DETECTED 05/21/2009 2300   LABBENZ NONE DETECTED 05/21/2009 2300   AMPHETMU NONE DETECTED 05/21/2009 2300   THCU NONE DETECTED 05/21/2009 2300   LABBARB  Value: NONE DETECTED        DRUG SCREEN FOR MEDICAL PURPOSES ONLY.  IF CONFIRMATION IS NEEDED FOR ANY PURPOSE, NOTIFY LAB WITHIN 5 DAYS.        LOWEST DETECTABLE LIMITS FOR URINE DRUG  SCREEN Drug Class       Cutoff (ng/mL) Amphetamine      1000 Barbiturate      200 Benzodiazepine   200 Tricyclics       300 Opiates          300 Cocaine          300 THC              50 05/21/2009 2300    Alcohol Level: No results found for this basename: ETH:2 in the last 168 hours Urinalysis:  Lab 10/21/11 0305  COLORURINE YELLOW  LABSPEC 1.020  PHURINE 7.5  GLUCOSEU 250*  HGBUR LARGE*  BILIRUBINUR NEGATIVE  KETONESUR NEGATIVE  PROTEINUR >300*  UROBILINOGEN 0.2  NITRITE NEGATIVE  LEUKOCYTESUR NEGATIVE   Micro Results: Recent Results (from the past 240 hour(s))  CULTURE, BLOOD (ROUTINE X 2)     Status: Normal   Collection Time   10/21/11  4:27 AM      Component Value Range Status Comment   Specimen Description BLOOD RIGHT HAND   Final    Special Requests BOTTLES DRAWN AEROBIC ONLY 5CC   Final    Culture  Setup Time 10/22/2011 02:53   Final    Culture      Final    Value: STAPHYLOCOCCUS AUREUS     Note: RIFAMPIN AND GENTAMICIN SHOULD NOT BE USED AS SINGLE DRUGS FOR TREATMENT OF STAPH INFECTIONS.     Note: Gram Stain Report Called to,Read Back By and Verified With: Biehle B @ [phone] BY FORSYTH K Performed at [facility]   Report Status 10/24/2011 FINAL   Final    Organism ID, Bacteria STAPHYLOCOCCUS AUREUS   Final   CULTURE, BLOOD (ROUTINE X 2)     Status: Normal   Collection Time   10/21/11  4:47 AM      Component Value Range Status Comment   Specimen Description BLOOD RIGHT ARM   Final    Special Requests BOTTLES DRAWN AEROBIC ONLY 5CC   Final    Culture  Setup Time 10/21/2011 21:45   Final    Culture     Final    Value: STAPHYLOCOCCUS AUREUS     Note: SUSCEPTIBILITIES PERFORMED ON PREVIOUS CULTURE WITHIN THE LAST 5 DAYS.     Note: Gram Stain Report Called to,Read Back By and Verified With: Gean Quint AT [phone] BY FORSYTH K Performed at [facility]   Report Status 10/24/2011 FINAL   Final   MRSA PCR SCREENING     Status: Normal   Collection Time   10/21/11  5:18 AM      Component Value Range Status Comment   MRSA by PCR NEGATIVE  NEGATIVE Final   URINE CULTURE     Status: Normal (Preliminary result)   Collection Time   10/23/11  6:30 PM      Component Value Range Status Comment   Specimen Description URINE, CLEAN CATCH   Final    Special Requests NONE   Final    Culture  Setup Time 10/24/2011 01:34   Final    Colony Count >=100,000 COLONIES/ML   Final    Culture ENTEROCOCCUS SPECIES   Final    Report Status PENDING   Incomplete   SURGICAL PCR SCREEN     Status: Abnormal   Collection Time   10/24/11  9:26 PM      Component Value Range Status Comment   MRSA, PCR NEGATIVE  NEGATIVE Final  Staphylococcus aureus POSITIVE (*) NEGATIVE Final   CULTURE, BLOOD (ROUTINE X 2)     Status: Normal (Preliminary result)   Collection Time   10/26/11  8:00 AM      Component Value Range Status Comment   Specimen  Description Blood   Final    Special Requests NONE   Final    Culture NO GROWTH <24 HRS   Final    Report Status PENDING   Incomplete   CULTURE, BLOOD (ROUTINE X 2)     Status: Normal (Preliminary result)   Collection Time   10/26/11  8:05 AM      Component Value Range Status Comment   Specimen Description Blood   Final    Special Requests NONE   Final    Culture NO GROWTH <24 HRS   Final    Report Status PENDING   Incomplete    Studies/Results: No results found. Scheduled Meds:    . epoetin alfa  2,000 Units Intravenous 3 times weekly  . FLUoxetine  20 mg Oral Daily  . gabapentin  300 mg Oral BID  . insulin aspart  0-5 Units Subcutaneous QHS  . insulin aspart  0-9 Units Subcutaneous TID WC  . insulin glargine  16 Units Subcutaneous QHS  . labetalol  300 mg Oral TID  . multivitamin  1 tablet Oral Daily  . sevelamer  800 mg Oral TID WC  . sodium chloride  10-40 mL Intracatheter Q12H  . sodium chloride  3 mL Intravenous Q12H  . vancomycin  1,000 mg Intravenous Q M,W,F-HD  . DISCONTD: ciprofloxacin  400 mg Intravenous Q24H  . DISCONTD: insulin glargine  12 Units Subcutaneous QHS   Continuous Infusions:    . sodium chloride 10 mL/hr (10/26/11 0246)   PRN Meds:.sodium chloride, acetaminophen, cloNIDine, heparin, heparin, HYDROmorphone, metoCLOPramide, ondansetron (ZOFRAN) IV, ondansetron, sodium chloride, sodium chloride, DISCONTD: HYDROmorphone Assessment/Plan: Principal Problem:  *Sepsis Active Problems:  Staphylococcus aureus bacteremia  End stage renal disease  UTI (urinary tract infection) due to Enterococcus  DM (diabetes mellitus), type 1 with renal complications  Hypertension  Sinus tachycardia  Medical non-compliance  Leg pain, bilateral  Blindness  bilateral leg pain  Await TEE.  Repeat BC Pending. Urine Cx results pending.  Try gabapentin for neuropathy   LOS: 6 days   [name] 10/26/2011, 12:41 PM

## 2011-10-27 LAB — GLUCOSE, CAPILLARY
Glucose-Capillary: 203 mg/dL — ABNORMAL HIGH (ref 70–99)
Glucose-Capillary: 182 mg/dL — ABNORMAL HIGH (ref 70–99)

## 2011-10-27 LAB — BASIC METABOLIC PANEL
CO2: 31 mEq/L (ref 19–32)
Calcium: 9.4 mg/dL (ref 8.4–10.5)
Creatinine, Ser: 6.74 mg/dL — ABNORMAL HIGH (ref 0.50–1.10)
GFR calc non Af Amer: 8 mL/min — ABNORMAL LOW (ref >90)
Glucose, Bld: 113 mg/dL — ABNORMAL HIGH (ref 70–99)

## 2011-10-27 MED ORDER — HYDROMORPHONE HCL 2 MG PO TABS
2.0000 mg | ORAL_TABLET | ORAL | Status: DC | PRN
  Administered 2011-10-27: 4 mg via ORAL
  Administered 2011-10-27: 2 mg via ORAL
  Administered 2011-10-28 – 2011-10-29 (×4): 4 mg via ORAL
  Administered 2011-10-29: 2 mg via ORAL
  Administered 2011-10-29 – 2011-10-30 (×4): 4 mg via ORAL
  Filled 2011-10-27 (×4): qty 2
  Filled 2011-10-27: qty 1
  Filled 2011-10-27 (×5): qty 2
  Filled 2011-10-27: qty 1

## 2011-10-27 NOTE — Progress Notes (Signed)
Subjective: Interval History: has no complaint of nausea or vomiting. She denies any difficulty breathing..  Objective: Vital signs in last 24 hours: Temp:  [97.9 F (36.6 C)-98.6 F (37 C)] 98.6 F (37 C) (09/22 0532) Pulse Rate:  [93-98] 93  (09/22 0532) Resp:  [17-20] 18  (09/22 0532) BP: (142-155)/(81-87) 155/87 mmHg (09/22 0532) SpO2:  [95 %-99 %] 98 % (09/22 0532) Weight:  [73.2 kg (161 lb 6 oz)] 73.2 kg (161 lb 6 oz) (09/22 0532) Weight change: 4.7 kg (10 lb 5.8 oz)  Intake/Output from previous day: 09/21 0701 - 09/22 0700 In: 1734.3 [P.O.:480; I.V.:1254.3] Out: -  Intake/Output this shift:    General appearance: alert, cooperative and no distress Resp: clear to auscultation bilaterally Cardio: regular rate and rhythm, S1, S2 normal, no murmur, click, rub or gallop GI: soft, non-tender; bowel sounds normal; no masses,  no organomegaly Extremities: extremities normal, atraumatic, no cyanosis or edema  Lab Results:  [facility] 10/25/11 0518  WBC 5.6  HGB 11.3*  HCT 34.6*  PLT 267   BMET:  Basename 10/27/11 0640 10/25/11 0518  NA 140 133*  K 4.3 3.8  CL 98 94*  CO2 31 27  GLUCOSE 113* 216*  BUN 42* 42*  CREATININE 6.74* 7.40*  CALCIUM 9.4 9.4   No results found for this basename: PTH:2 in the last 72 hours Iron Studies: No results found for this basename: IRON,TIBC,TRANSFERRIN,FERRITIN in the last 72 hours  Studies/Results: No results found.  I have reviewed the patient's current medications.  Assessment/Plan: Problem #1 end-stage renal disease she status post hemodialysis on Friday her BUN is 42 creatinine 6.74 stable. Problem #2 hyperkalemia potassium is 4.3 corrected Problem #3 anemia her hemoglobin is 11.3 hematocrit 34.6 stable. Problem #4 history of staph aureus bacteremia presently she is a febrile and her white cell count is normal. Presently her catheter has been removed.  problem #5 history of hypertension blood pressure seems to be better  controlled  Problem #6 diabetes Problem #7 CK D.MBD. Her phosphrous is 6.4 better on binder and calcium is normal Plan for dialysis tomorrow and continue with other meds   LOS: 7 days   Shashank Kwasnik S 10/27/2011,8:13 AM

## 2011-10-27 NOTE — Progress Notes (Signed)
Subjective: Pain much better on gabapentin and higher dose of oral Dilaudid.  Objective: Vital signs in last 24 hours: Filed Vitals:   10/26/11 1514 10/26/11 2111 10/27/11 0500 10/27/11 0532  BP: 142/81 145/84  155/87  Pulse: 94 98  93  Temp: 98.5 F (36.9 C) 97.9 F (36.6 C)  98.6 F (37 C)  TempSrc: Oral Oral  Oral  Resp: 20 17  18   Height:      Weight:   73.2 kg (161 lb 6 oz) 73.2 kg (161 lb 6 oz)  SpO2: 95% 99%  98%   Weight change: 4.7 kg (10 lb 5.8 oz)  Intake/Output Summary (Last 24 hours) at 10/27/11 0855 Last data filed at 10/26/11 2000  Gross per 24 hour  Intake 1734.33 ml  Output      0 ml  Net 1734.33 ml   Gen: Comfortable. Eating breakfast and talking on the telephone. Lungs clear to auscultation bilaterally without wheeze rhonchi or rales Cardiovascular regular rate rhythm without murmurs gallops rubs Abdomen soft nontender nondistended Extremities no clubbing cyanosis or edema  Lab Results: Basic Metabolic Panel:  Lab 10/27/11 1610 10/25/11 0518 10/22/11 0530  NA 140 133* --  K 4.3 3.8 --  CL 98 94* --  CO2 31 27 --  GLUCOSE 113* 216* --  BUN 42* 42* --  CREATININE 6.74* 7.40* --  CALCIUM 9.4 9.4 --  MG -- -- --  PHOS 6.4* -- 6.9*   Liver Function Tests:  Lab 10/22/11 0530 10/21/11 0910 10/21/11 0004  AST 19 -- 44*  ALT 18 -- 25  ALKPHOS 129* -- 164*  BILITOT 0.4 -- 0.5  PROT 7.6 -- 8.6*  ALBUMIN 2.8* 3.3* --    Lab 10/21/11 0723  LIPASE 13  AMYLASE --   No results found for this basename: AMMONIA:2 in the last 168 hours CBC:  Lab 10/25/11 0518 10/23/11 0527  WBC 5.6 6.7  NEUTROABS -- --  HGB 11.3* 10.8*  HCT 34.6* 33.0*  MCV 95.3 95.4  PLT 267 231   Cardiac Enzymes:  Lab 10/25/11 0518  CKTOTAL 70  CKMB --  CKMBINDEX --  TROPONINI --   BNP: No results found for this basename: PROBNP:3 in the last 168 hours D-Dimer: No results found for this basename: DDIMER:2 in the last 168 hours CBG:  Lab 10/27/11 0744  10/26/11 2042 10/26/11 1613 10/26/11 1112 10/26/11 0736 10/25/11 2053  GLUCAP 97 234* 123* 126* 101* 304*   Hemoglobin A1C:  Lab 10/22/11 0950  HGBA1C 7.7*   Fasting Lipid Panel: No results found for this basename: CHOL,HDL,LDLCALC,TRIG,CHOLHDL,LDLDIRECT in the last 960 hours Thyroid Function Tests:  Lab 10/23/11 0527 10/22/11 0950  TSH -- 1.452  T4TOTAL -- --  FREET4 1.03 --  T3FREE -- --  Marko Plume -- --   Coagulation: No results found for this basename: LABPROT:4,INR:4 in the last 168 hours Anemia Panel: No results found for this basename: VITAMINB12,FOLATE,FERRITIN,TIBC,IRON,RETICCTPCT in the last 168 hours Urine Drug Screen: Drugs of Abuse     Component Value Date/Time   LABOPIA POSITIVE* 05/21/2009 2300   COCAINSCRNUR NONE DETECTED 05/21/2009 2300   LABBENZ NONE DETECTED 05/21/2009 2300   AMPHETMU NONE DETECTED 05/21/2009 2300   THCU NONE DETECTED 05/21/2009 2300   LABBARB  Value: NONE DETECTED        DRUG SCREEN FOR MEDICAL PURPOSES ONLY.  IF CONFIRMATION IS NEEDED FOR ANY PURPOSE, NOTIFY LAB WITHIN 5 DAYS.        LOWEST DETECTABLE LIMITS FOR URINE DRUG  SCREEN Drug Class       Cutoff (ng/mL) Amphetamine      1000 Barbiturate      200 Benzodiazepine   200 Tricyclics       300 Opiates          300 Cocaine          300 THC              50 05/21/2009 2300    Alcohol Level: No results found for this basename: ETH:2 in the last 168 hours Urinalysis:  Lab 10/21/11 0305  COLORURINE YELLOW  LABSPEC 1.020  PHURINE 7.5  GLUCOSEU 250*  HGBUR LARGE*  BILIRUBINUR NEGATIVE  KETONESUR NEGATIVE  PROTEINUR >300*  UROBILINOGEN 0.2  NITRITE NEGATIVE  LEUKOCYTESUR NEGATIVE   Micro Results: Recent Results (from the past 240 hour(s))  CULTURE, BLOOD (ROUTINE X 2)     Status: Normal   Collection Time   10/21/11  4:27 AM      Component Value Range Status Comment   Specimen Description BLOOD RIGHT HAND   Final    Special Requests BOTTLES DRAWN AEROBIC ONLY 5CC   Final    Culture   Setup Time 10/22/2011 02:53   Final    Culture     Final    Value: STAPHYLOCOCCUS AUREUS     Note: RIFAMPIN AND GENTAMICIN SHOULD NOT BE USED AS SINGLE DRUGS FOR TREATMENT OF STAPH INFECTIONS.     Note: Gram Stain Report Called to,Read Back By and Verified With: Petronila B @ [phone] BY FORSYTH K Performed at [facility]   Report Status 10/24/2011 FINAL   Final    Organism ID, Bacteria STAPHYLOCOCCUS AUREUS   Final   CULTURE, BLOOD (ROUTINE X 2)     Status: Normal   Collection Time   10/21/11  4:47 AM      Component Value Range Status Comment   Specimen Description BLOOD RIGHT ARM   Final    Special Requests BOTTLES DRAWN AEROBIC ONLY 5CC   Final    Culture  Setup Time 10/21/2011 21:45   Final    Culture     Final    Value: STAPHYLOCOCCUS AUREUS     Note: SUSCEPTIBILITIES PERFORMED ON PREVIOUS CULTURE WITHIN THE LAST 5 DAYS.     Note: Gram Stain Report Called to,Read Back By and Verified With: Gean Quint AT [id number] BY FORSYTH K Performed at [facility]   Report Status 10/24/2011 FINAL   Final   MRSA PCR SCREENING     Status: Normal   Collection Time   10/21/11  5:18 AM      Component Value Range Status Comment   MRSA by PCR NEGATIVE  NEGATIVE Final   URINE CULTURE     Status: Normal (Preliminary result)   Collection Time   10/23/11  6:30 PM      Component Value Range Status Comment   Specimen Description URINE, CLEAN CATCH   Final    Special Requests NONE   Final    Culture  Setup Time 10/24/2011 01:34   Final    Colony Count >=100,000 COLONIES/ML   Final    Culture ENTEROCOCCUS SPECIES   Final    Report Status PENDING   Incomplete   SURGICAL PCR SCREEN     Status: Abnormal   Collection Time   10/24/11  9:26 PM      Component Value Range Status Comment   MRSA, PCR NEGATIVE  NEGATIVE Final  Staphylococcus aureus POSITIVE (*) NEGATIVE Final   CATH TIP CULTURE     Status: Normal (Preliminary result)   Collection Time   10/25/11  8:57 AM      Component Value  Range Status Comment   Specimen Description CATH TIP   Final    Special Requests Normal   Final    Culture Culture reincubated for better growth   Final    Report Status PENDING   Incomplete   CULTURE, BLOOD (ROUTINE X 2)     Status: Normal (Preliminary result)   Collection Time   10/26/11  8:00 AM      Component Value Range Status Comment   Specimen Description DRAWN BY RN PICC LINE   Final    Special Requests     Final    Value: BOTTLES DRAWN AEROBIC AND ANAEROBIC 6CC EACH BOTTLE   Culture NO GROWTH 1 DAY   Final    Report Status PENDING   Incomplete   CULTURE, BLOOD (ROUTINE X 2)     Status: Normal (Preliminary result)   Collection Time   10/26/11  8:05 AM      Component Value Range Status Comment   Specimen Description BLOOD RIGHT HAND   Final    Special Requests BOTTLES DRAWN AEROBIC ONLY 4CC BOTTLE   Final    Culture NO GROWTH 1 DAY   Final    Report Status PENDING   Incomplete    Studies/Results: No results found. Scheduled Meds:    . epoetin alfa  2,000 Units Intravenous 3 times weekly  . FLUoxetine  20 mg Oral Daily  . gabapentin  300 mg Oral BID  . insulin aspart  0-5 Units Subcutaneous QHS  . insulin aspart  0-9 Units Subcutaneous TID WC  . insulin glargine  16 Units Subcutaneous QHS  . labetalol  300 mg Oral TID  . multivitamin  1 tablet Oral Daily  . sevelamer  800 mg Oral TID WC  . sodium chloride  10-40 mL Intracatheter Q12H  . sodium chloride  3 mL Intravenous Q12H  . vancomycin  1,000 mg Intravenous Q M,W,F-HD   Continuous Infusions:    . sodium chloride 10 mL/hr at 10/26/11 1737   PRN Meds:.sodium chloride, acetaminophen, cloNIDine, heparin, heparin, HYDROmorphone, metoCLOPramide, ondansetron (ZOFRAN) IV, ondansetron, sodium chloride, sodium chloride, DISCONTD: HYDROmorphone Assessment/Plan: Principal Problem:  *Sepsis Active Problems:  Staphylococcus aureus bacteremia  End stage renal disease  UTI (urinary tract infection) due to Enterococcus  DM  (diabetes mellitus), type 1 with renal complications  Hypertension  Sinus tachycardia  Medical non-compliance  Leg pain, bilateral  Blindness  bilateral leg pain  Await TEE.  Repeat BC Pending. Urine Cx results pending.  If TEE negative for vegetation, will need 4 weeks of vancomycin, which can be given after dialysis. Diabetes better controlled.   LOS: 7 days   Virlan Kempker L 10/27/2011, 8:55 AM

## 2011-10-27 NOTE — Progress Notes (Signed)
ANTIBIOTIC CONSULT NOTE   Pharmacy Consult for Vancomycin Indication: UTI, sepsis, staph bacteremia  Allergies  Allergen Reactions  . Ancef (Cefazolin Sodium) Shortness Of Breath    wheezing  . Coumadin (Warfarin Sodium)     Eyes bleed  . Morphine And Related Itching  . Sulfa Antibiotics Anaphylaxis and Shortness Of Breath  . Tomato Hives and Swelling   Patient Measurements: Height: 5\' 8"  (172.7 cm) Weight: 161 lb 6 oz (73.2 kg) IBW/kg (Calculated) : 63.9   Vital Signs: Temp: 98.6 F (37 C) (09/22 0532) Temp src: Oral (09/22 0532) BP: 155/87 mmHg (09/22 0532) Pulse Rate: 93  (09/22 0532) Intake/Output from previous day: 09/21 0701 - 09/22 0700 In: 1734.3 [P.O.:480; I.V.:1254.3] Out: -  Intake/Output from this shift: Total I/O In: 440 [P.O.:440] Out: -   Labs:  Basename 10/27/11 0640 10/25/11 0518  WBC -- 5.6  HGB -- 11.3*  PLT -- 267  LABCREA -- --  CREATININE 6.74* 7.40*   No results found for this basename: VANCOTROUGH:2,VANCOPEAK:2,VANCORANDOM:2,GENTTROUGH:2,GENTPEAK:2,GENTRANDOM:2,TOBRATROUGH:2,TOBRAPEAK:2,TOBRARND:2,AMIKACINPEAK:2,AMIKACINTROU:2,AMIKACIN:2, in the last 72 hours   Microbiology: Recent Results (from the past 720 hour(s))  CULTURE, BLOOD (ROUTINE X 2)     Status: Normal   Collection Time   10/21/11  4:27 AM      Component Value Range Status Comment   Specimen Description BLOOD RIGHT HAND   Final    Special Requests BOTTLES DRAWN AEROBIC ONLY 5CC   Final    Culture  Setup Time 10/22/2011 02:53   Final    Culture     Final    Value: STAPHYLOCOCCUS AUREUS     Note: RIFAMPIN AND GENTAMICIN SHOULD NOT BE USED AS SINGLE DRUGS FOR TREATMENT OF STAPH INFECTIONS.     Note: Gram Stain Report Called to,Read Back By and Verified With: [place] B @ [phone] BY FORSYTH K Performed at [facility]   Report Status 10/24/2011 FINAL   Final    Organism ID, Bacteria STAPHYLOCOCCUS AUREUS   Final   CULTURE, BLOOD (ROUTINE X 2)     Status: Normal   Collection Time   10/21/11  4:47 AM      Component Value Range Status Comment   Specimen Description BLOOD RIGHT ARM   Final    Special Requests BOTTLES DRAWN AEROBIC ONLY 5CC   Final    Culture  Setup Time 10/21/2011 21:45   Final    Culture     Final    Value: STAPHYLOCOCCUS AUREUS     Note: SUSCEPTIBILITIES PERFORMED ON PREVIOUS CULTURE WITHIN THE LAST 5 DAYS.     Note: Gram Stain Report Called to,Read Back By and Verified With: Gean Quint AT [phone] BY FORSYTH K Performed at [facility]   Report Status 10/24/2011 FINAL   Final   MRSA PCR SCREENING     Status: Normal   Collection Time   10/21/11  5:18 AM      Component Value Range Status Comment   MRSA by PCR NEGATIVE  NEGATIVE Final   URINE CULTURE     Status: Normal (Preliminary result)   Collection Time   10/23/11  6:30 PM      Component Value Range Status Comment   Specimen Description URINE, CLEAN CATCH   Final    Special Requests NONE   Final    Culture  Setup Time 10/24/2011 01:34   Final    Colony Count >=100,000 COLONIES/ML   Final    Culture ENTEROCOCCUS SPECIES   Final  Report Status PENDING   Incomplete   SURGICAL PCR SCREEN     Status: Abnormal   Collection Time   10/24/11  9:26 PM      Component Value Range Status Comment   MRSA, PCR NEGATIVE  NEGATIVE Final    Staphylococcus aureus POSITIVE (*) NEGATIVE Final   CATH TIP CULTURE     Status: Normal (Preliminary result)   Collection Time   10/25/11  8:57 AM      Component Value Range Status Comment   Specimen Description CATH TIP   Final    Special Requests Normal   Final    Culture Culture reincubated for better growth   Final    Report Status PENDING   Incomplete   CULTURE, BLOOD (ROUTINE X 2)     Status: Normal (Preliminary result)   Collection Time   10/26/11  8:00 AM      Component Value Range Status Comment   Specimen Description DRAWN BY RN PICC LINE   Final    Special Requests     Final    Value: BOTTLES DRAWN AEROBIC AND ANAEROBIC 6CC EACH  BOTTLE   Culture NO GROWTH 1 DAY   Final    Report Status PENDING   Incomplete   CULTURE, BLOOD (ROUTINE X 2)     Status: Normal (Preliminary result)   Collection Time   10/26/11  8:05 AM      Component Value Range Status Comment   Specimen Description BLOOD RIGHT HAND   Final    Special Requests BOTTLES DRAWN AEROBIC ONLY 4CC BOTTLE   Final    Culture NO GROWTH 1 DAY   Final    Report Status PENDING   Incomplete    Medications:  Scheduled:     . epoetin alfa  2,000 Units Intravenous 3 times weekly  . FLUoxetine  20 mg Oral Daily  . gabapentin  300 mg Oral BID  . insulin aspart  0-5 Units Subcutaneous QHS  . insulin aspart  0-9 Units Subcutaneous TID WC  . insulin glargine  16 Units Subcutaneous QHS  . labetalol  300 mg Oral TID  . multivitamin  1 tablet Oral Daily  . sevelamer  800 mg Oral TID WC  . sodium chloride  10-40 mL Intracatheter Q12H  . sodium chloride  3 mL Intravenous Q12H  . vancomycin  1,000 mg Intravenous Q M,W,F-HD   Assessment: 26yo female with ESRD requiring dialysis admitted with infectious process.  MSSA Bacteremia (repeat cultures pending). Enterococcus UTI (Sensitivities Pending). Tunneled dialysis catheter out. Patient currently being dialyzed from her AV graft.  Goal of Therapy:  Pre-Hemodialysis Vancomycin level goal range =15-25 mcg/ml  Plan: Vancomycin 1gm IV after each dialysis. Check pre-dialysis level with AM labs. Monitor labs and cultures per protocol.  Mady Gemma 10/27/2011,9:25 AM

## 2011-10-28 ENCOUNTER — Encounter (HOSPITAL_COMMUNITY): Payer: Self-pay | Admitting: Cardiology

## 2011-10-28 DIAGNOSIS — E1129 Type 2 diabetes mellitus with other diabetic kidney complication: Secondary | ICD-10-CM

## 2011-10-28 DIAGNOSIS — Z2239 Carrier of other specified bacterial diseases: Secondary | ICD-10-CM

## 2011-10-28 DIAGNOSIS — N186 End stage renal disease: Secondary | ICD-10-CM

## 2011-10-28 DIAGNOSIS — Z22338 Carrier of other streptococcus: Secondary | ICD-10-CM

## 2011-10-28 DIAGNOSIS — R7881 Bacteremia: Secondary | ICD-10-CM

## 2011-10-28 DIAGNOSIS — N058 Unspecified nephritic syndrome with other morphologic changes: Secondary | ICD-10-CM

## 2011-10-28 LAB — URINE CULTURE

## 2011-10-28 LAB — GLUCOSE, CAPILLARY: Glucose-Capillary: 267 mg/dL — ABNORMAL HIGH (ref 70–99)

## 2011-10-28 LAB — VANCOMYCIN, RANDOM: Vancomycin Rm: 13.6 ug/mL

## 2011-10-28 LAB — BASIC METABOLIC PANEL
Calcium: 9.2 mg/dL (ref 8.4–10.5)
GFR calc non Af Amer: 6 mL/min — ABNORMAL LOW (ref >90)
Sodium: 140 mEq/L (ref 135–145)

## 2011-10-28 MED ORDER — INSULIN GLARGINE 100 UNIT/ML ~~LOC~~ SOLN
10.0000 [IU] | Freq: Once | SUBCUTANEOUS | Status: AC
  Administered 2011-10-28: 10 [IU] via SUBCUTANEOUS

## 2011-10-28 MED ORDER — SODIUM CHLORIDE 0.9 % IJ SOLN
INTRAMUSCULAR | Status: AC
  Filled 2011-10-28: qty 3

## 2011-10-28 MED ORDER — CLONIDINE HCL 0.1 MG PO TABS
0.1000 mg | ORAL_TABLET | Freq: Two times a day (BID) | ORAL | Status: DC
  Administered 2011-10-28: 0.1 mg via ORAL

## 2011-10-28 MED ORDER — INSULIN GLARGINE 100 UNIT/ML ~~LOC~~ SOLN: 16.0000 [IU] | Freq: Every day | SUBCUTANEOUS | Status: DC

## 2011-10-28 MED ORDER — VANCOMYCIN HCL 1000 MG IV SOLR
1500.0000 mg | INTRAVENOUS | Status: DC
  Administered 2011-10-28: 1000 mg via INTRAVENOUS
  Administered 2011-10-30: 1500 mg via INTRAVENOUS
  Filled 2011-10-28 (×2): qty 1500

## 2011-10-28 NOTE — Progress Notes (Signed)
Infection control nurse called with urine culture results positive for VRE. Patient at dialysis at time. Dialysis nurse notified to initiate contact precautions. Dr. Lendell Caprice aware of results.

## 2011-10-28 NOTE — Progress Notes (Signed)
Patient continues with high blood pressure 196/136, Dr. Lendell Caprice notified.

## 2011-10-28 NOTE — Progress Notes (Signed)
INITIAL ADULT NUTRITION ASSESSMENT Date: 10/28/2011   Time: 4:33 PM Reason for Assessment: LOS   ASSESSMENT: Female 26 y.o.  Dx: Sepsis   Past Medical History  Diagnosis Date  . 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 Hypertension   . Hyperlipidemia   . Nephrotic syndrome   . Anemia     Chronic,  Dr. Mariel Sleet,  Transfused, May, 2012  . DVT (deep venous thrombosis)     Bilateral . recurrent  coumadin  . Hypertrophic cardiomyopathy     Diastolic dysfunction  . Legally blind     Bilateral hemorrhagic retinal detachments  . Vaginosis   . History of MRSA infection     Multiple  . Glaucoma   . Warfarin anticoagulation     DVT  . Manic depressive disorder   . Dialysis patient     M-W-F  . Acute pyelonephritis   . Medical non-compliance     Scheduled Meds:   . epoetin alfa  2,000 Units Intravenous 3 times weekly  . FLUoxetine  20 mg Oral Daily  . gabapentin  300 mg Oral BID  . insulin aspart  0-5 Units Subcutaneous QHS  . insulin aspart  0-9 Units Subcutaneous TID WC  . insulin glargine  10 Units Subcutaneous Once  . insulin glargine  16 Units Subcutaneous QHS  . labetalol  300 mg Oral TID  . multivitamin  1 tablet Oral Daily  . sevelamer  800 mg Oral TID WC  . sodium chloride  10-40 mL Intracatheter Q12H  . sodium chloride  3 mL Intravenous Q12H  . vancomycin  1,500 mg Intravenous Q M,W,F-HD  . DISCONTD: insulin glargine  16 Units Subcutaneous QHS  . DISCONTD: vancomycin  1,000 mg Intravenous Q M,W,F-HD   Continuous Infusions:   . sodium chloride 10 mL/hr at 10/27/11 1741   PRN Meds:.sodium chloride, acetaminophen, cloNIDine, heparin, heparin, HYDROmorphone, metoCLOPramide, ondansetron (ZOFRAN) IV, ondansetron, sodium chloride, sodium chloride  Ht: 5\' 8"  (172.7 cm)  Wt: 166 lb 10.7 oz (75.6 kg)  Ideal Wt: 63.9 kg  % Ideal Wt: 119%  Usual Wt:  Wt Readings from Last 10 Encounters:  10/28/11 166 lb 10.7 oz  (75.6 kg)  07/10/11 145 lb 4.5 oz (65.9 kg)  07/10/11 145 lb 4.5 oz (65.9 kg)  05/23/11 131 lb (59.421 kg)  05/22/11 134 lb (60.782 kg)  04/30/11 149 lb (67.586 kg)     Body mass index is 25.34 kg/(m^2). Overweight  Food/Nutrition Related Hx: Pt denies wt loss in fact she has gained 21#,15% since previous admission in June which is significant. Reports that appetite is variable; which is reflected in her po intake hx since admission 25-100% of meals. She will  Be NPO after MN for TEE and is concerned about being hungry. Will provide a HS snack for her. She is at risk for malnutrition given her multiple chronic dz. She has increased energy and protein requirements with HD tx.  CMP     Component Value Date/Time   NA 140 10/28/2011 0534   K 4.9 10/28/2011 0534   CL 100 10/28/2011 0534   CO2 27 10/28/2011 0534   GLUCOSE 198* 10/28/2011 0534   BUN 57* 10/28/2011 0534   CREATININE 7.99* 10/28/2011 0534   CREATININE 1.89* 12/26/2009 1113   CALCIUM 9.2 10/28/2011 0534   CALCIUM 8.7 07/04/2011 0514   PROT 7.6 10/22/2011 0530   ALBUMIN 2.8* 10/22/2011 0530   AST 19 10/22/2011 0530  ALT 18 10/22/2011 0530   ALKPHOS 129* 10/22/2011 0530   BILITOT 0.4 10/22/2011 0530   GFRNONAA 6* 10/28/2011 0534   GFRAA 7* 10/28/2011 0534    Intake/Output Summary (Last 24 hours) at 10/28/11 1644 Last data filed at 10/28/11 1322  Gross per 24 hour  Intake 240.67 ml  Output   3000 ml  Net -2759.33 ml    Diet Order: Carb Control  Supplements:Rena-Vit (MVI)  IVF:    sodium chloride Last Rate: 10 mL/hr at 10/27/11 1741    Estimated Nutritional Needs:   Kcal:2050-2227 kcal Protein:88-95 gr Fluid:Urine output plus 1000 cc  NUTRITION DIAGNOSIS: -Increased nutrient needs (NI-5.1).  Status: Ongoing  RELATED TO: ESRD-HD  AS EVIDENCE BY: estimated energy and protein needs recommended by K/DOQI guidelines and pt meal intake records  MONITORING/EVALUATION(Goals): Monitor: meal and snack intake Goal: Pt to  meet >/= 90% of their estimated nutrition needs; met  EDUCATION NEEDS: -Education needs addressed r/t increased protein requirements  INTERVENTION: -Provide HS snack - Will cont to follow nutrition status  Dietitian [phone]  DOCUMENTATION CODES Per approved criteria  -Not Applicable    Francene Boyers 10/28/2011, 4:33 PM

## 2011-10-28 NOTE — Progress Notes (Signed)
Discussed with pharmacy, RN, Dr. [name], Dr. Diona Browner  Subjective: Leg pain much better on gabapentin and higher dose of oral Dilaudid. No flank pain. No nausea vomiting. Patient admits to not checking her sugars regularly. She has a glucometer that gives visual results only, and she has run out of strips.. She does not take short-acting insulin much according to her friend who helps her during the day. She reports that her boyfriend who is there at night sometimes helps her draw up her Lantus insulin, but she gives it herself.  Objective: Vital signs in last 24 hours: Filed Vitals:   10/28/11 0500 10/28/11 0926 10/28/11 0943 10/28/11 1000  BP: 189/114 142/99 147/105 131/80  Pulse: 107 105 101 98  Temp: 99.4 F (37.4 C) 98.8 F (37.1 C)    TempSrc:  Oral    Resp: 16 16 16 16   Height:      Weight: 75.6 kg (166 lb 10.7 oz)     SpO2: 98% 99%     Weight change: 2.4 kg (5 lb 4.7 oz)  Intake/Output Summary (Last 24 hours) at 10/28/11 1026 Last data filed at 10/27/11 1741  Gross per 24 hour  Intake 360.67 ml  Output      0 ml  Net 360.67 ml   Gen: Comfortable. Talkative. nontoxic Lungs clear to auscultation bilaterally without wheeze rhonchi or rales Cardiovascular regular rate rhythm without murmurs gallops rubs Abdomen soft nontender nondistended Extremities no clubbing cyanosis or edema  Lab Results: Basic Metabolic Panel:  Lab 10/28/11 4696 10/27/11 0640 10/22/11 0530  NA 140 140 --  K 4.9 4.3 --  CL 100 98 --  CO2 27 31 --  GLUCOSE 198* 113* --  BUN 57* 42* --  CREATININE 7.99* 6.74* --  CALCIUM 9.2 9.4 --  MG -- -- --  PHOS -- 6.4* 6.9*   Liver Function Tests:  Lab 10/22/11 0530  AST 19  ALT 18  ALKPHOS 129*  BILITOT 0.4  PROT 7.6  ALBUMIN 2.8*   No results found for this basename: LIPASE:2,AMYLASE:2 in the last 168 hours No results found for this basename: AMMONIA:2 in the last 168 hours CBC:  Lab 10/25/11 0518 10/23/11 0527  WBC 5.6 6.7  NEUTROABS  -- --  HGB 11.3* 10.8*  HCT 34.6* 33.0*  MCV 95.3 95.4  PLT 267 231   Cardiac Enzymes:  Lab 10/25/11 0518  CKTOTAL 70  CKMB --  CKMBINDEX --  TROPONINI --   BNP: No results found for this basename: PROBNP:3 in the last 168 hours D-Dimer: No results found for this basename: DDIMER:2 in the last 168 hours CBG:  Lab 10/28/11 0726 10/27/11 2044 10/27/11 1703 10/27/11 1134 10/27/11 0744 10/26/11 2042  GLUCAP 163* 236* 182* 203* 97 234*   Hemoglobin A1C:  Lab 10/22/11 0950  HGBA1C 7.7*   Fasting Lipid Panel: No results found for this basename: CHOL,HDL,LDLCALC,TRIG,CHOLHDL,LDLDIRECT in the last 295 hours Thyroid Function Tests:  Lab 10/23/11 0527 10/22/11 0950  TSH -- 1.452  T4TOTAL -- --  FREET4 1.03 --  T3FREE -- --  Marko Plume -- --   Coagulation: No results found for this basename: LABPROT:4,INR:4 in the last 168 hours Anemia Panel: No results found for this basename: VITAMINB12,FOLATE,FERRITIN,TIBC,IRON,RETICCTPCT in the last 168 hours Urine Drug Screen: Drugs of Abuse     Component Value Date/Time   LABOPIA POSITIVE* 05/21/2009 2300   COCAINSCRNUR NONE DETECTED 05/21/2009 2300   LABBENZ NONE DETECTED 05/21/2009 2300   AMPHETMU NONE DETECTED 05/21/2009 2300  THCU NONE DETECTED 05/21/2009 2300   LABBARB  Value: NONE DETECTED        DRUG SCREEN FOR MEDICAL PURPOSES ONLY.  IF CONFIRMATION IS NEEDED FOR ANY PURPOSE, NOTIFY LAB WITHIN 5 DAYS.        LOWEST DETECTABLE LIMITS FOR URINE DRUG SCREEN Drug Class       Cutoff (ng/mL) Amphetamine      1000 Barbiturate      200 Benzodiazepine   200 Tricyclics       300 Opiates          300 Cocaine          300 THC              50 05/21/2009 2300    Alcohol Level: No results found for this basename: ETH:2 in the last 168 hours Urinalysis: No results found for this basename: COLORURINE:2,APPERANCEUR:2,LABSPEC:2,PHURINE:2,GLUCOSEU:2,HGBUR:2,BILIRUBINUR:2,KETONESUR:2,PROTEINUR:2,UROBILINOGEN:2,NITRITE:2,LEUKOCYTESUR:2 in the last 168  hours Micro Results: Recent Results (from the past 240 hour(s))  CULTURE, BLOOD (ROUTINE X 2)     Status: Normal   Collection Time   10/21/11  4:27 AM      Component Value Range Status Comment   Specimen Description BLOOD RIGHT HAND   Final    Special Requests BOTTLES DRAWN AEROBIC ONLY 5CC   Final    Culture  Setup Time 10/22/2011 02:53   Final    Culture     Final    Value: STAPHYLOCOCCUS AUREUS     Note: RIFAMPIN AND GENTAMICIN SHOULD NOT BE USED AS SINGLE DRUGS FOR TREATMENT OF STAPH INFECTIONS.     Note: Gram Stain Report Called to,Read Back By and Verified With: [place] B @ [phone] BY FORSYTH K Performed at [facility]   Report Status 10/24/2011 FINAL   Final    Organism ID, Bacteria STAPHYLOCOCCUS AUREUS   Final   CULTURE, BLOOD (ROUTINE X 2)     Status: Normal   Collection Time   10/21/11  4:47 AM      Component Value Range Status Comment   Specimen Description BLOOD RIGHT ARM   Final    Special Requests BOTTLES DRAWN AEROBIC ONLY 5CC   Final    Culture  Setup Time 10/21/2011 21:45   Final    Culture     Final    Value: STAPHYLOCOCCUS AUREUS     Note: SUSCEPTIBILITIES PERFORMED ON PREVIOUS CULTURE WITHIN THE LAST 5 DAYS.     Note: Gram Stain Report Called to,Read Back By and Verified With: Gean Quint AT [id number] BY FORSYTH K Performed at [facility]   Report Status 10/24/2011 FINAL   Final   MRSA PCR SCREENING     Status: Normal   Collection Time   10/21/11  5:18 AM      Component Value Range Status Comment   MRSA by PCR NEGATIVE  NEGATIVE Final   URINE CULTURE     Status: Normal   Collection Time   10/23/11  6:30 PM      Component Value Range Status Comment   Specimen Description URINE, CLEAN CATCH   Final    Special Requests NONE   Final    Culture  Setup Time 10/24/2011 01:34   Final    Colony Count >=100,000 COLONIES/ML   Final    Culture     Final    Value: VANCOMYCIN RESISTANT ENTEROCOCCUS ISOLATED     Note: CRITICAL RESULT CALLED TO, READ  BACK BY AND VERIFIED WITH: CHRISTINA K@[id number]  ON [id number]  BY [facility]   Report Status 10/28/2011 FINAL   Final    Organism ID, Bacteria VANCOMYCIN RESISTANT ENTEROCOCCUS ISOLATED   Final   SURGICAL PCR SCREEN     Status: Abnormal   Collection Time   10/24/11  9:26 PM      Component Value Range Status Comment   MRSA, PCR NEGATIVE  NEGATIVE Final    Staphylococcus aureus POSITIVE (*) NEGATIVE Final   CATH TIP CULTURE     Status: Normal (Preliminary result)   Collection Time   10/25/11  8:57 AM      Component Value Range Status Comment   Specimen Description CATH TIP   Final    Special Requests Normal   Final    Culture     Final    Value: 25 COLONIES STAPHYLOCOCCUS AUREUS     Note: RIFAMPIN AND GENTAMICIN SHOULD NOT BE USED AS SINGLE DRUGS FOR TREATMENT OF STAPH INFECTIONS.   Report Status PENDING   Incomplete   CULTURE, BLOOD (ROUTINE X 2)     Status: Normal (Preliminary result)   Collection Time   10/26/11  8:00 AM      Component Value Range Status Comment   Specimen Description DRAWN BY RN PICC LINE   Final    Special Requests     Final    Value: BOTTLES DRAWN AEROBIC AND ANAEROBIC 6CC EACH BOTTLE   Culture NO GROWTH 2 DAYS   Final    Report Status PENDING   Incomplete   CULTURE, BLOOD (ROUTINE X 2)     Status: Normal (Preliminary result)   Collection Time   10/26/11  8:05 AM      Component Value Range Status Comment   Specimen Description BLOOD RIGHT HAND   Final    Special Requests BOTTLES DRAWN AEROBIC ONLY 4CC BOTTLE   Final    Culture NO GROWTH 2 DAYS   Final    Report Status PENDING   Incomplete    Studies/Results: No results found. Scheduled Meds:    . epoetin alfa  2,000 Units Intravenous 3 times weekly  . FLUoxetine  20 mg Oral Daily  . gabapentin  300 mg Oral BID  . insulin aspart  0-5 Units Subcutaneous QHS  . insulin aspart  0-9 Units Subcutaneous TID WC  . insulin glargine  10 Units Subcutaneous Once  . insulin glargine  16 Units Subcutaneous QHS  . labetalol  300  mg Oral TID  . multivitamin  1 tablet Oral Daily  . sevelamer  800 mg Oral TID WC  . sodium chloride  10-40 mL Intracatheter Q12H  . sodium chloride  3 mL Intravenous Q12H  . vancomycin  1,500 mg Intravenous Q M,W,F-HD  . DISCONTD: insulin glargine  16 Units Subcutaneous QHS  . DISCONTD: vancomycin  1,000 mg Intravenous Q M,W,F-HD   Continuous Infusions:    . sodium chloride 10 mL/hr at 10/27/11 1741   PRN Meds:.sodium chloride, acetaminophen, cloNIDine, heparin, heparin, HYDROmorphone, metoCLOPramide, ondansetron (ZOFRAN) IV, ondansetron, sodium chloride, sodium chloride Assessment/Plan: Principal Problem:  *Sepsis Active Problems:  Methicillin sensitive Staphylococcus aureus bacteremia  End stage renal disease  DM (diabetes mellitus), type 1 with renal complications  Hypertension  Sinus tachycardia  Medical non-compliance  Leg pain, bilateral  Blindness  bilateral leg pain, likely peripheral neuropathy, responding to gabapentin. Dopplers negative. VRE in urine, colonization  TEE tomorrow. Will make npo after midnight and give lower dose lantus tonight. Repeat BC negative to date. Dialysis catheter out. Patient's current staph aureus  is a different strain (methicillin sensitive) than previous bacteremia. Dialysis catheter tip growing staph aureus. Infectious disease ([place]) recommend 4 weeks of antibiotics after catheter removed. Patient is allergic to Ancef, so would continue with vancomycin after each dialysis session. If TEE shows vegetation, of course plans would change, and would need to evaluate need for longer antibiotics, et Karie Soda.   Urine Cx grew out VRE. However, patient has improved on vancomycin. Her original urinalysis did have a few white cells and bacteria, but leukocyte esterase and nitrates negative. As such, this is likely colonization. Would not treat unless she develops fevers or symptoms on current regimen. Dr. [name] agrees. Patient will need to be on  contact precautions.  Type 1 diabetes with complications: It is clear that patient is not optimally monitoring or controlling her diabetes at home. Will order home health nurse.  Recommend continuing Neurontin at discharge.   LOS: 8 days   Arlee Santosuosso L 10/28/2011, 10:26 AM

## 2011-10-28 NOTE — Progress Notes (Signed)
ANTIBIOTIC CONSULT NOTE   Pharmacy Consult for Vancomycin Indication: UTI, sepsis, staph bacteremia  Allergies  Allergen Reactions  . Ancef (Cefazolin Sodium) Shortness Of Breath    wheezing  . Coumadin (Warfarin Sodium)     Eyes bleed  . Morphine And Related Itching  . Sulfa Antibiotics Anaphylaxis and Shortness Of Breath  . Tomato Hives and Swelling   Patient Measurements: Height: 5\' 8"  (172.7 cm) Weight: 166 lb 10.7 oz (75.6 kg) IBW/kg (Calculated) : 63.9   Vital Signs: Temp: 99.4 F (37.4 C) (09/23 0500) Temp src: Oral (09/22 2100) BP: 189/114 mmHg (09/23 0500) Pulse Rate: 107  (09/23 0500) Intake/Output from previous day: 09/22 0701 - 09/23 0700 In: 800.7 [P.O.:560; I.V.:240.7] Out: -  Intake/Output from this shift:    Labs:  Basename 10/28/11 0534 10/27/11 0640  WBC -- --  HGB -- --  PLT -- --  LABCREA -- --  CREATININE 7.99* 6.74*    Basename 10/28/11 0535  VANCOTROUGH --  Leodis Binet --  VANCORANDOM 13.6  GENTTROUGH --  GENTPEAK --  GENTRANDOM --  TOBRATROUGH --  TOBRAPEAK --  TOBRARND --  AMIKACINPEAK --  AMIKACINTROU --  AMIKACIN --     Microbiology: Recent Results (from the past 720 hour(s))  CULTURE, BLOOD (ROUTINE X 2)     Status: Normal   Collection Time   10/21/11  4:27 AM      Component Value Range Status Comment   Specimen Description BLOOD RIGHT HAND   Final    Special Requests BOTTLES DRAWN AEROBIC ONLY 5CC   Final    Culture  Setup Time 10/22/2011 02:53   Final    Culture     Final    Value: STAPHYLOCOCCUS AUREUS     Note: RIFAMPIN AND GENTAMICIN SHOULD NOT BE USED AS SINGLE DRUGS FOR TREATMENT OF STAPH INFECTIONS.     Note: Gram Stain Report Called to,Read Back By and Verified With: [place] B @ [phone] BY FORSYTH K Performed at [facility]   Report Status 10/24/2011 FINAL   Final    Organism ID, Bacteria STAPHYLOCOCCUS AUREUS   Final   CULTURE, BLOOD (ROUTINE X 2)     Status: Normal   Collection Time   10/21/11   4:47 AM      Component Value Range Status Comment   Specimen Description BLOOD RIGHT ARM   Final    Special Requests BOTTLES DRAWN AEROBIC ONLY 5CC   Final    Culture  Setup Time 10/21/2011 21:45   Final    Culture     Final    Value: STAPHYLOCOCCUS AUREUS     Note: SUSCEPTIBILITIES PERFORMED ON PREVIOUS CULTURE WITHIN THE LAST 5 DAYS.     Note: Gram Stain Report Called to,Read Back By and Verified With: Gean Quint AT [id number] BY FORSYTH K Performed at [facility]   Report Status 10/24/2011 FINAL   Final   MRSA PCR SCREENING     Status: Normal   Collection Time   10/21/11  5:18 AM      Component Value Range Status Comment   MRSA by PCR NEGATIVE  NEGATIVE Final   URINE CULTURE     Status: Normal (Preliminary result)   Collection Time   10/23/11  6:30 PM      Component Value Range Status Comment   Specimen Description URINE, CLEAN CATCH   Final    Special Requests NONE   Final    Culture  Setup Time 10/24/2011 01:34  Final    Colony Count >=100,000 COLONIES/ML   Final    Culture ENTEROCOCCUS SPECIES   Final    Report Status PENDING   Incomplete   SURGICAL PCR SCREEN     Status: Abnormal   Collection Time   10/24/11  9:26 PM      Component Value Range Status Comment   MRSA, PCR NEGATIVE  NEGATIVE Final    Staphylococcus aureus POSITIVE (*) NEGATIVE Final   CATH TIP CULTURE     Status: Normal (Preliminary result)   Collection Time   10/25/11  8:57 AM      Component Value Range Status Comment   Specimen Description CATH TIP   Final    Special Requests Normal   Final    Culture     Final    Value: 25 COLONIES STAPHYLOCOCCUS AUREUS     Note: RIFAMPIN AND GENTAMICIN SHOULD NOT BE USED AS SINGLE DRUGS FOR TREATMENT OF STAPH INFECTIONS.   Report Status PENDING   Incomplete   CULTURE, BLOOD (ROUTINE X 2)     Status: Normal (Preliminary result)   Collection Time   10/26/11  8:00 AM      Component Value Range Status Comment   Specimen Description DRAWN BY RN PICC LINE   Final      Special Requests     Final    Value: BOTTLES DRAWN AEROBIC AND ANAEROBIC 6CC EACH BOTTLE   Culture NO GROWTH 1 DAY   Final    Report Status PENDING   Incomplete   CULTURE, BLOOD (ROUTINE X 2)     Status: Normal (Preliminary result)   Collection Time   10/26/11  8:05 AM      Component Value Range Status Comment   Specimen Description BLOOD RIGHT HAND   Final    Special Requests BOTTLES DRAWN AEROBIC ONLY 4CC BOTTLE   Final    Culture NO GROWTH 1 DAY   Final    Report Status PENDING   Incomplete    Medications:  Scheduled:     . epoetin alfa  2,000 Units Intravenous 3 times weekly  . FLUoxetine  20 mg Oral Daily  . gabapentin  300 mg Oral BID  . insulin aspart  0-5 Units Subcutaneous QHS  . insulin aspart  0-9 Units Subcutaneous TID WC  . insulin glargine  16 Units Subcutaneous QHS  . labetalol  300 mg Oral TID  . multivitamin  1 tablet Oral Daily  . sevelamer  800 mg Oral TID WC  . sodium chloride  10-40 mL Intracatheter Q12H  . sodium chloride  3 mL Intravenous Q12H  . vancomycin  1,500 mg Intravenous Q M,W,F-HD  . DISCONTD: vancomycin  1,000 mg Intravenous Q M,W,F-HD   Assessment: 26yo female with ESRD requiring dialysis admitted with infectious process.  MSSA Bacteremia (repeat cultures pending). Enterococcus UTI (Sensitivities Pending). Tunneled dialysis catheter out. Patient currently being dialyzed from her AV graft. Pre-HD vanc level is subtherapeutic.  Goal of Therapy:  Pre-Hemodialysis Vancomycin level goal range =15-25 mcg/ml  Plan: Increase Vancomycin 1.5gm IV after each dialysis. Check weekly pre-dialysis vanc level while on Vancomycin. Monitor renal function and cx data   [name] 10/28/2011,8:50 AM

## 2011-10-28 NOTE — Progress Notes (Signed)
Patient blood pressure 189/114 at 5am, rechecked 182/122. Dr. Lendell Caprice notified, orders to give schedule 10am Labetalol now.

## 2011-10-28 NOTE — Consult Note (Signed)
Requesting physician: Dr. Crista Curb  Clinical Summary Ms. 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 Dr. Eden Emms was consulted to see the patient on Friday 9/20 to assist with arranging a TEE for exclusion of valvular vegetations, per recommendations from ID consultation, as this will help to determine proper course of antibiotic therapy. Her surface echocardiogram did not demonstrate definite vegetations. When I learned of these plans today, the patient had unfortunately already eaten a full breakfast, had not been kept n.p.o., and was not on the schedule for TEE procedure - brief note in chart by Ms. Dunn reviewed. I spoke with Dr. Lendell Caprice, and we have arranged for TEE to occur tomorrow morning at 10 AM with Dr. [name].  Ms. Kapsner denies any problems with dysphagia, active dental issues, hematemesis, or known varices. She does report an allergy to morphine, has had good pain control with Dilaudid, both by mouth and IV. She reports no problems with Versed.   Allergies  Allergen Reactions  . Ancef (Cefazolin Sodium) Shortness Of Breath    wheezing  . Coumadin (Warfarin Sodium)     Eyes bleed  . Morphine And Related Itching  . Sulfa Antibiotics Anaphylaxis and Shortness Of Breath  . Tomato Hives and Swelling    Medications    . epoetin alfa  2,000 Units Intravenous 3 times weekly  . FLUoxetine  20 mg Oral Daily  . gabapentin  300 mg Oral BID  . insulin aspart  0-5 Units Subcutaneous QHS  . insulin aspart  0-9 Units Subcutaneous TID WC  . insulin glargine  10 Units Subcutaneous Once  . insulin glargine  16 Units Subcutaneous QHS  . labetalol  300 mg Oral TID  .  multivitamin  1 tablet Oral Daily  . sevelamer  800 mg Oral TID WC  . sodium chloride  10-40 mL Intracatheter Q12H  . sodium chloride  3 mL Intravenous Q12H  . vancomycin  1,500 mg Intravenous Q M,W,F-HD  . DISCONTD: insulin glargine  16 Units Subcutaneous QHS  . DISCONTD: vancomycin  1,000 mg Intravenous Q M,W,F-HD    Past Medical History  Diagnosis Date  . 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 Hypertension   . Hyperlipidemia   . Nephrotic syndrome   . Anemia     Chronic,  Dr. Mariel Sleet,  Transfused, May, 2012  . DVT (deep venous thrombosis)     Bilateral . recurrent  coumadin  . Hypertrophic cardiomyopathy     Diastolic dysfunction  . Legally blind     Bilateral hemorrhagic retinal detachments  . Vaginosis   . History of MRSA infection     Multiple  . Glaucoma   . Warfarin anticoagulation     DVT  . Manic depressive disorder   . Dialysis patient     M-W-F  . Acute pyelonephritis   . Medical non-compliance     Past Surgical History  Procedure Date  . Left arm cyst removal   . Incise and drain abcess 04/24/11    Multiple MRSA - March 2013 drain port of abcess  . Placement of port a cath     Poor venous access  . Av fistula placement   . Hematoma evacuation  05/25/2011    Procedure: EVACUATION HEMATOMA;  Surgeon: Sherren Kerns, MD;  Location: [facility] OR;  Service: Vascular;  Laterality: Right;  Evacuation Hematoma Right Arm  . Port-a-cath removal 07/04/2011    Procedure: MINOR REMOVAL PORT-A-CATH;  Surgeon: Dalia Heading, MD;  Location: [facility] ORS;  Service: General;  Laterality: Left;  Removal Port-A-Cath/Removal Dialysis Catheter in Minor Room    Family History  Problem Relation Age of Onset  . Diabetes Mother   . Hypertension Mother   . Diabetes Father     Social History Ms. Peskin reports that she has never smoked. She does not have any smokeless tobacco history on file. Ms. Hinote reports that she does not  drink alcohol.  Review of Systems No palpitations or chest pain. Stable appetite. Tolerating her hemodialysis. Otherwise negative.  Physical Examination Blood pressure 159/105, pulse 94, temperature 98.8 F (37.1 C), temperature source Oral, resp. rate 16, height 5\' 8"  (1.727 m), weight 166 lb 10.7 oz (75.6 kg), SpO2 99.00%. Weight change: 5 lb 4.7 oz (2.4 kg)  Young, chronically ill-appearing woman in no acute distress. HEENT: Conjunctiva and lids normal, oropharynx clear. Neck: Supple, no elevated JVP or carotid bruits, no thyromegaly. Lungs: Clear to auscultation, nonlabored breathing at rest. Cardiac: Regular rate and rhythm, no S3 or significant systolic murmur, no pericardial rub. Abdomen: Soft, nontender, bowel sounds present, no guarding or rebound. Extremities: No pitting edema, distal pulses 2+. Skin: Warm and dry. Musculoskeletal: No kyphosis. Neuropsychiatric: Alert and oriented x3, affect grossly appropriate.   Lab Results Basic Metabolic Panel:  Lab 10/28/11 4098 10/27/11 0640 10/25/11 0518 10/23/11 0527 10/22/11 0530  NA 140 140 133* 134* 135  K 4.9 4.3 -- -- --  CL 100 98 94* 92* 94*  CO2 27 31 27 26 28   GLUCOSE 198* 113* 216* 150* 153*  BUN 57* 42* 42* 36* 24*  CREATININE 7.99* 6.74* 7.40* 7.41* 5.81*  CALCIUM 9.2 9.4 9.4 9.1 9.2  MG -- -- -- -- --  PHOS -- 6.4* -- -- 6.9*   Liver Function Tests:  Lab 10/22/11 0530  AST 19  ALT 18  ALKPHOS 129*  BILITOT 0.4  PROT 7.6  ALBUMIN 2.8*   CBC:  Lab 10/25/11 0518 10/23/11 0527 10/22/11 0530  WBC 5.6 6.7 6.8  NEUTROABS -- -- --  HGB 11.3* 10.8* 11.8*  HCT 34.6* 33.0* 35.7*  MCV 95.3 95.4 95.2  PLT 267 231 209   Cardiac Enzymes:  Lab 10/25/11 0518  CKTOTAL 70  CKMB --  CKMBINDEX --  TROPONINI --   CBG:  Lab 10/28/11 0726 10/27/11 2044 10/27/11 1703 10/27/11 1134 10/27/11 0744  GLUCAP 163* 236* 182* 203* 97    Echocardiogram 9/19 Study Conclusions  - Left ventricle: The cavity size was  normal. Wall thickness was increased in a pattern of moderate LVH. Systolic function was vigorous. The estimated ejection fraction was in the range of 65% to 70%. Wall motion was normal; there were no regional wall motion abnormalities. - Right ventricle: The cavity size was normal. Wall thickness was at the upper limits of normal. - Line: No abnormalities noted. Impressions:  - Compared to the prior study performed 07/09/11, there has been no significant interval change.  Impression  1. Methicillin Sensitive Staphylococcus Aureus bacteremia. ID consultation obtained by the primary team with recommendation for TEE to better guide duration of antibiotic therapy. Surface echocardiogram did not demonstrate obvious vegetations. She has had a history of recurrent bacteremia, also has had a left IJ  dialysis catheter as potential source.  2. Previous history of DVT on Coumadin, however anticoagulation was discontinued in setting of bilateral hemorrhagic retinal detachments. Will reobtain PT/INR for baseline.  3. ESRD, on hemodialysis.  4. 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  5. Type 1 diabetes mellitus with neuropathy and chronic leg pain.  6. Morphine allergy.   Recommendations  Discussed with the patient and also Dr. Lendell Caprice. TEE has been scheduled for tomorrow morning at 10:00 AM. Orders written for n.p.o. status as well as temporary reduction in insulin. She reports good control of pain with Dilaudid, was sleeping when assessed in hemodialysis. In terms of sedation for TEE in light of morphine allergy, may want to consider low-dose combination of Versed and perhaps Dilaudid. Willl defer to Dr. [name].  Jonelle Sidle, M.D., F.A.C.C.

## 2011-10-28 NOTE — Progress Notes (Signed)
Subjective: Interval History: has no complaint of difficulty in breathing. Appetite is good overall she is feeling better. She denies any nausea vomiting..  Objective: Vital signs in last 24 hours: Temp:  [98.4 F (36.9 C)-99.4 F (37.4 C)] 99.4 F (37.4 C) (09/23 0500) Pulse Rate:  [92-108] 107  (09/23 0500) Resp:  [16-18] 16  (09/23 0500) BP: (141-189)/(82-114) 189/114 mmHg (09/23 0500) SpO2:  [98 %-100 %] 98 % (09/23 0500) Weight:  [75.6 kg (166 lb 10.7 oz)] 75.6 kg (166 lb 10.7 oz) (09/23 0500) Weight change: 2.4 kg (5 lb 4.7 oz)  Intake/Output from previous day: 09/22 0701 - 09/23 0700 In: 800.7 [P.O.:560; I.V.:240.7] Out: -  Intake/Output this shift:    General appearance: alert, cooperative and no distress Resp: clear to auscultation bilaterally Cardio: regular rate and rhythm and systolic murmur: systolic ejection 3/6, medium pitch at 2nd left intercostal space GI: soft, non-tender; bowel sounds normal; no masses,  no organomegaly Extremities: extremities normal, atraumatic, no cyanosis or edema  Lab Results: No results found for this basename: WBC:2,HGB:2,HCT:2,PLT:2 in the last 72 hours BMET:  [facility] 10/28/11 0534 10/27/11 0640  NA 140 140  K 4.9 4.3  CL 100 98  CO2 27 31  GLUCOSE 198* 113*  BUN 57* 42*  CREATININE 7.99* 6.74*  CALCIUM 9.2 9.4   No results found for this basename: PTH:2 in the last 72 hours Iron Studies: No results found for this basename: IRON,TIBC,TRANSFERRIN,FERRITIN in the last 72 hours  Studies/Results: No results found.  I have reviewed the patient's current medications.  Assessment/Plan: Problem #1 end-stage renal disease she status post hemodialysis on Friday her BUN is 57 creatinine 7.99 she is due for dialysis today. Her potassium is 4.9. Problem #2 hypertension blood pressure seems to be reasonably controlled Problem #3 staph aureus bacteremia she is on vancomycin patient is a febrile. Her white blood cell count is  normal. Problem #4 history of diabetes her sugar seems to be better controlled   problem #5 history of diabetic retinopathy Problem #6 anemia her H&H stable Problem #7 chronic pain syndrome. Plan: We'll do dialysis today and continue his other medications. If patient is going to be discharged I will continue  with antibiotics for another 3 weeks.    LOS: 8 days   Wasim Hurlbut S 10/28/2011,7:26 AM

## 2011-10-28 NOTE — Progress Notes (Signed)
Administered Clonidine 0.1mg  PRN as ordered for BP of 205/120. Rechecked in hour BP 186/122. Dr. Lendell Caprice notified. New orders for Clonidine 0.1mg  BID

## 2011-10-29 ENCOUNTER — Encounter (HOSPITAL_COMMUNITY)
Admission: EM | Disposition: A | Discharge status: Home Health | Payer: Self-pay | Source: Home / Self Care | Attending: Internal Medicine

## 2011-10-29 ENCOUNTER — Encounter (HOSPITAL_COMMUNITY): Payer: Self-pay

## 2011-10-29 ENCOUNTER — Other Ambulatory Visit (HOSPITAL_COMMUNITY): Payer: Medicaid Other

## 2011-10-29 DIAGNOSIS — I38 Endocarditis, valve unspecified: Secondary | ICD-10-CM

## 2011-10-29 DIAGNOSIS — R509 Fever, unspecified: Secondary | ICD-10-CM

## 2011-10-29 DIAGNOSIS — E11649 Type 2 diabetes mellitus with hypoglycemia without coma: Secondary | ICD-10-CM | POA: Diagnosis not present

## 2011-10-29 DIAGNOSIS — E1169 Type 2 diabetes mellitus with other specified complication: Secondary | ICD-10-CM

## 2011-10-29 DIAGNOSIS — I10 Essential (primary) hypertension: Secondary | ICD-10-CM

## 2011-10-29 HISTORY — PX: TEE WITHOUT CARDIOVERSION: SHX5443

## 2011-10-29 HISTORY — DX: Endocarditis, valve unspecified: I38

## 2011-10-29 LAB — GLUCOSE, CAPILLARY
Glucose-Capillary: 139 mg/dL — ABNORMAL HIGH (ref 70–99)
Glucose-Capillary: 76 mg/dL (ref 70–99)
Glucose-Capillary: 95 mg/dL (ref 70–99)

## 2011-10-29 LAB — CATH TIP CULTURE: Culture: 25

## 2011-10-29 LAB — PROTIME-INR
INR: 1 (ref 0.00–1.49)
Prothrombin Time: 13.1 seconds (ref 11.6–15.2)

## 2011-10-29 SURGERY — ECHOCARDIOGRAM, TRANSESOPHAGEAL
Anesthesia: Moderate Sedation

## 2011-10-29 MED ORDER — AMLODIPINE BESYLATE 5 MG PO TABS
5.0000 mg | ORAL_TABLET | Freq: Every day | ORAL | Status: DC
  Administered 2011-10-29: 5 mg via ORAL
  Filled 2011-10-29 (×2): qty 1

## 2011-10-29 MED ORDER — FENTANYL CITRATE 0.05 MG/ML IJ SOLN
INTRAMUSCULAR | Status: AC
  Filled 2011-10-29: qty 2

## 2011-10-29 MED ORDER — CLONIDINE HCL 0.3 MG/24HR TD PTWK: 0.3000 mg | MEDICATED_PATCH | TRANSDERMAL | Status: DC

## 2011-10-29 MED ORDER — CLONIDINE HCL 0.2 MG/24HR TD PTWK
0.2000 mg | MEDICATED_PATCH | TRANSDERMAL | Status: DC
  Administered 2011-10-29: 0.2 mg via TRANSDERMAL
  Filled 2011-10-29: qty 1

## 2011-10-29 MED ORDER — MIDAZOLAM HCL 5 MG/5ML IJ SOLN
INTRAMUSCULAR | Status: DC | PRN
  Administered 2011-10-29: 1 mg via INTRAVENOUS
  Administered 2011-10-29: 2 mg via INTRAVENOUS

## 2011-10-29 MED ORDER — BUTAMBEN-TETRACAINE-BENZOCAINE 2-2-14 % EX AERO
INHALATION_SPRAY | CUTANEOUS | Status: DC | PRN
  Administered 2011-10-29: 2 via TOPICAL

## 2011-10-29 MED ORDER — SODIUM CHLORIDE 0.9 % IV SOLN
INTRAVENOUS | Status: DC
  Administered 2011-10-29: 20 mL/h via INTRAVENOUS

## 2011-10-29 MED ORDER — DEXTROSE 50 % IV SOLN
INTRAVENOUS | Status: AC
  Administered 2011-10-29: 25 mL via INTRAVENOUS
  Filled 2011-10-29: qty 50

## 2011-10-29 MED ORDER — DEXTROSE 50 % IV SOLN
25.0000 mL | Freq: Once | INTRAVENOUS | Status: AC | PRN
  Administered 2011-10-29: 25 mL via INTRAVENOUS

## 2011-10-29 MED ORDER — MIDAZOLAM HCL 5 MG/5ML IJ SOLN
INTRAMUSCULAR | Status: AC
  Filled 2011-10-29: qty 10

## 2011-10-29 NOTE — Progress Notes (Signed)
Subjective: The patient was noted to have a capillary blood glucose of 56. She was treated with dextrose. Her blood glucose increased to 139. She says that she felt a little weak but not sweaty or shaky. She has no complaints of chest pain. Her leg pain is better with gabapentin.  Objective: Vital signs in last 24 hours: Filed Vitals:   10/29/11 1045 10/29/11 1050 10/29/11 1055 10/29/11 1100  BP: 212/107 206/103 192/103 204/103  Pulse: 103 99 96 95  Temp:      TempSrc:      Resp: 18 15 19 17   Height:      Weight:      SpO2: 100% 100% 100% 100%    Intake/Output Summary (Last 24 hours) at 10/29/11 1137 Last data filed at 10/29/11 0039  Gross per 24 hour  Intake    416 ml  Output   3700 ml  Net  -3284 ml    Weight change: -4.385 kg (-9 lb 10.7 oz)  Physical exam: General: 26 year old after American woman sitting up in bed, in no acute distress. Heart: S1, S2, with a 1-2/6 systolic murmur. Lungs: Clear to auscultation bilaterally. Abdomen: Positive bowel sounds, soft, nontender, nondistended. Extremities: No pedal edema.  Lab Results: Basic Metabolic Panel:  Basename 10/28/11 0534 10/27/11 0640  NA 140 140  K 4.9 4.3  CL 100 98  CO2 27 31  GLUCOSE 198* 113*  BUN 57* 42*  CREATININE 7.99* 6.74*  CALCIUM 9.2 9.4  MG -- --  PHOS -- 6.4*   Liver Function Tests: No results found for this basename: AST:2,ALT:2,ALKPHOS:2,BILITOT:2,PROT:2,ALBUMIN:2 in the last 72 hours No results found for this basename: LIPASE:2,AMYLASE:2 in the last 72 hours No results found for this basename: AMMONIA:2 in the last 72 hours CBC: No results found for this basename: WBC:2,NEUTROABS:2,HGB:2,HCT:2,MCV:2,PLT:2 in the last 72 hours Cardiac Enzymes: No results found for this basename: CKTOTAL:3,CKMB:3,CKMBINDEX:3,TROPONINI:3 in the last 72 hours BNP: No results found for this basename: PROBNP:3 in the last 72 hours D-Dimer: No results found for this basename: DDIMER:2 in the last 72  hours CBG:  Basename 10/29/11 1126 10/29/11 0912 10/29/11 0804 10/29/11 0743 10/28/11 2120 10/28/11 1659  GLUCAP 76 139* 64* 56* 267* 118*   Hemoglobin A1C: No results found for this basename: HGBA1C in the last 72 hours Fasting Lipid Panel: No results found for this basename: CHOL,HDL,LDLCALC,TRIG,CHOLHDL,LDLDIRECT in the last 72 hours Thyroid Function Tests: No results found for this basename: TSH,T4TOTAL,FREET4,T3FREE,THYROIDAB in the last 72 hours Anemia Panel: No results found for this basename: VITAMINB12,FOLATE,FERRITIN,TIBC,IRON,RETICCTPCT in the last 72 hours Coagulation:  Basename 10/29/11 0547  LABPROT 13.1  INR 1.00   Urine Drug Screen: Drugs of Abuse     Component Value Date/Time   LABOPIA POSITIVE* 05/21/2009 2300   COCAINSCRNUR NONE DETECTED 05/21/2009 2300   LABBENZ NONE DETECTED 05/21/2009 2300   AMPHETMU NONE DETECTED 05/21/2009 2300   THCU NONE DETECTED 05/21/2009 2300   LABBARB  Value: NONE DETECTED        DRUG SCREEN FOR MEDICAL PURPOSES ONLY.  IF CONFIRMATION IS NEEDED FOR ANY PURPOSE, NOTIFY LAB WITHIN 5 DAYS.        LOWEST DETECTABLE LIMITS FOR URINE DRUG SCREEN Drug Class       Cutoff (ng/mL) Amphetamine      1000 Barbiturate      200 Benzodiazepine   200 Tricyclics       300 Opiates          300 Cocaine  300 THC              50 05/21/2009 2300    Alcohol Level: No results found for this basename: ETH:2 in the last 72 hours Urinalysis: No results found for this basename: COLORURINE:2,APPERANCEUR:2,LABSPEC:2,PHURINE:2,GLUCOSEU:2,HGBUR:2,BILIRUBINUR:2,KETONESUR:2,PROTEINUR:2,UROBILINOGEN:2,NITRITE:2,LEUKOCYTESUR:2 in the last 72 hours Misc. Labs:  Micro: Recent Results (from the past 240 hour(s))  CULTURE, BLOOD (ROUTINE X 2)     Status: Normal   Collection Time   10/21/11  4:27 AM      Component Value Range Status Comment   Specimen Description BLOOD RIGHT HAND   Final    Special Requests BOTTLES DRAWN AEROBIC ONLY 5CC   Final    Culture  Setup  Time 10/22/2011 02:53   Final    Culture     Final    Value: STAPHYLOCOCCUS AUREUS     Note: RIFAMPIN AND GENTAMICIN SHOULD NOT BE USED AS SINGLE DRUGS FOR TREATMENT OF STAPH INFECTIONS.     Note: Gram Stain Report Called to,Read Back By and Verified With: Hyde B @ [phone] BY FORSYTH K Performed at [facility]   Report Status 10/24/2011 FINAL   Final    Organism ID, Bacteria STAPHYLOCOCCUS AUREUS   Final   CULTURE, BLOOD (ROUTINE X 2)     Status: Normal   Collection Time   10/21/11  4:47 AM      Component Value Range Status Comment   Specimen Description BLOOD RIGHT ARM   Final    Special Requests BOTTLES DRAWN AEROBIC ONLY 5CC   Final    Culture  Setup Time 10/21/2011 21:45   Final    Culture     Final    Value: STAPHYLOCOCCUS AUREUS     Note: SUSCEPTIBILITIES PERFORMED ON PREVIOUS CULTURE WITHIN THE LAST 5 DAYS.     Note: Gram Stain Report Called to,Read Back By and Verified With: Gean Quint AT [id number] BY FORSYTH K Performed at [facility]   Report Status 10/24/2011 FINAL   Final   MRSA PCR SCREENING     Status: Normal   Collection Time   10/21/11  5:18 AM      Component Value Range Status Comment   MRSA by PCR NEGATIVE  NEGATIVE Final   URINE CULTURE     Status: Normal   Collection Time   10/23/11  6:30 PM      Component Value Range Status Comment   Specimen Description URINE, CLEAN CATCH   Final    Special Requests NONE   Final    Culture  Setup Time 10/24/2011 01:34   Final    Colony Count >=100,000 COLONIES/ML   Final    Culture     Final    Value: VANCOMYCIN RESISTANT ENTEROCOCCUS ISOLATED     Note: CRITICAL RESULT CALLED TO, READ BACK BY AND VERIFIED WITH: CHRISTINA K@[id number]  ON [id number] BY [facility]   Report Status 10/28/2011 FINAL   Final    Organism ID, Bacteria VANCOMYCIN RESISTANT ENTEROCOCCUS ISOLATED   Final   SURGICAL PCR SCREEN     Status: Abnormal   Collection Time   10/24/11  9:26 PM      Component Value Range Status Comment   MRSA, PCR  NEGATIVE  NEGATIVE Final    Staphylococcus aureus POSITIVE (*) NEGATIVE Final   CATH TIP CULTURE     Status: Normal   Collection Time   10/25/11  8:57 AM      Component Value Range Status Comment   Specimen Description  CATH TIP HEMODIALYSIS CATHETER   Final    Special Requests NONE   Final    Culture     Final    Value: 25 COLONIES STAPHYLOCOCCUS AUREUS     Note: RIFAMPIN AND GENTAMICIN SHOULD NOT BE USED AS SINGLE DRUGS FOR TREATMENT OF STAPH INFECTIONS.   Report Status 10/29/2011 FINAL   Final    Organism ID, Bacteria STAPHYLOCOCCUS AUREUS   Final   CULTURE, BLOOD (ROUTINE X 2)     Status: Normal (Preliminary result)   Collection Time   10/26/11  8:00 AM      Component Value Range Status Comment   Specimen Description BLOOD PICC LINE DRAWN BY RN   Final    Special Requests     Final    Value: BOTTLES DRAWN AEROBIC AND ANAEROBIC 6CC EACH BOTTLE   Culture NO GROWTH 3 DAYS   Final    Report Status PENDING   Incomplete   CULTURE, BLOOD (ROUTINE X 2)     Status: Normal (Preliminary result)   Collection Time   10/26/11  8:05 AM      Component Value Range Status Comment   Specimen Description BLOOD RIGHT HAND   Final    Special Requests BOTTLES DRAWN AEROBIC ONLY 4CC   Final    Culture NO GROWTH 3 DAYS   Final    Report Status PENDING   Incomplete     Studies/Results: No results found.  Medications:  Scheduled:   . amLODipine  5 mg Oral Daily  . cloNIDine  0.2 mg Transdermal Weekly  . epoetin alfa  2,000 Units Intravenous 3 times weekly  . fentaNYL      . FLUoxetine  20 mg Oral Daily  . gabapentin  300 mg Oral BID  . insulin aspart  0-5 Units Subcutaneous QHS  . insulin aspart  0-9 Units Subcutaneous TID WC  . insulin glargine  10 Units Subcutaneous Once  . insulin glargine  16 Units Subcutaneous QHS  . labetalol  300 mg Oral TID  . midazolam      . multivitamin  1 tablet Oral Daily  . sevelamer  800 mg Oral TID WC  . sodium chloride  10-40 mL Intracatheter Q12H  . sodium  chloride  3 mL Intravenous Q12H  . sodium chloride      . vancomycin  1,500 mg Intravenous Q M,W,F-HD  . DISCONTD: cloNIDine  0.3 mg Transdermal Weekly  . DISCONTD: cloNIDine  0.1 mg Oral BID   Continuous:   . sodium chloride 10 mL/hr at 10/27/11 1741  . DISCONTD: sodium chloride 20 mL/hr (10/29/11 0315)   YNW:GNFAOZ chloride, acetaminophen, dextrose, heparin, heparin, HYDROmorphone, metoCLOPramide, ondansetron (ZOFRAN) IV, ondansetron, sodium chloride, sodium chloride, DISCONTD: butamben-tetracaine-benzocaine, DISCONTD: cloNIDine, DISCONTD: midazolam  Assessment: Principal Problem:  *Endocarditis Active Problems:  Sepsis  UTI (urinary tract infection) due to Enterococcus  Hyperkalemia  End stage renal disease  Sinus tachycardia  Medical non-compliance  DM (diabetes mellitus), type 1 with renal complications  Hypertension  Staphylococcus aureus bacteremia  Leg pain, bilateral  Blindness  Colonization with VRE (vancomycin-resistant enterococcus)  Hypoglycemia associated with diabetes   Dr. [name] called and reported the TEE revealed a vegetation on the PICC line by the tricuspid valve. I informed him that the PICC line was placed during this hospitalization and it is likely that the bacteremia/vegetation started prior to the PICC line insertion. The PICC line will be taken out immediately. This was discussed with the patient's registered nurse. The plan is  to discuss further treatment with infectious diseases physician at [facility].  The patient was mildly symptomatic with hypoglycemia this morning; she was treated per protocol with a half an ampule of D50. Will adjust insulin accordingly and will discontinue Lantus for now.   Plan:  1. Discontinue PICC line. We'll hold off on any further indwelling IV. Will continue to treat with vancomycin during dialysis. 2. We'll discuss further outpatient long-term treatment with infectious diseases physician. 3. Discontinue  Lantus. 4. Continue supportive treatment.     LOS: 9 days   [name] 10/29/2011, 11:37 AM

## 2011-10-29 NOTE — Progress Notes (Signed)
*  PRELIMINARY RESULTS* Echocardiogram Echocardiogram Transesophageal has been performed.  [name] 10/29/2011, 11:22 AM

## 2011-10-29 NOTE — Progress Notes (Signed)
Subjective: Interval History: has no complaint of nausea or vomiting. At this moment she is feeling better. She has occasional cough but no sputum production. Patient also denies any chest pain..  Objective: Vital signs in last 24 hours: Temp:  [97.3 F (36.3 C)-99.1 F (37.3 C)] 98.2 F (36.8 C) (09/24 0515) Pulse Rate:  [93-105] 96  (09/24 0515) Resp:  [14-20] 19  (09/24 0515) BP: (118-205)/(79-136) 197/107 mmHg (09/24 0515) SpO2:  [99 %-100 %] 100 % (09/24 0515) Weight:  [71.215 kg (157 lb)] 71.215 kg (157 lb) (09/24 0515) Weight change: -4.385 kg (-9 lb 10.7 oz)  Intake/Output from previous day: 09/23 0701 - 09/24 0700 In: 416 [P.O.:240; I.V.:176] Out: 3700 [Urine:700] Intake/Output this shift:    General appearance: alert, cooperative and no distress Resp: clear to auscultation bilaterally Cardio: systolic murmur: systolic ejection 2/6, medium pitch at 2nd left intercostal space GI: soft, non-tender; bowel sounds normal; no masses,  no organomegaly Extremities: extremities normal, atraumatic, no cyanosis or edema  Lab Results: No results found for this basename: WBC:2,HGB:2,HCT:2,PLT:2 in the last 72 hours BMET:  [facility] 10/28/11 0534 10/27/11 0640  NA 140 140  K 4.9 4.3  CL 100 98  CO2 27 31  GLUCOSE 198* 113*  BUN 57* 42*  CREATININE 7.99* 6.74*  CALCIUM 9.2 9.4   No results found for this basename: PTH:2 in the last 72 hours Iron Studies: No results found for this basename: IRON,TIBC,TRANSFERRIN,FERRITIN in the last 72 hours  Studies/Results: No results found.  I have reviewed the patient's current medications.  Assessment/Plan: Problem #1 end-stage renal disease she status post hemodialysis yesterday her BUN is 57 with creatinine of 7.99. Her potassium is 4.9. Problem #2 hypertension her blood pressure seems to be reasonably controlled Problem #3 history of staph aureus bacteremia she is on vancomycin presently patient is a febrile with normal white  blood cell count. Problem #4 diabetes  problem #5 history of diabetic retinopathy  Problem #6 history of anemia H&H is stable Problem #7 history of hyperphosphatemia she is on a binder. Plan: We'll do dialysis tomorrow We'll check her basic metabolic panel and phosphorus in the morning. We'll continue with vancomycin for a total of 4 weeks as outpatient when patient is going to discharge.   LOS: 9 days   Donnielle Addison S 10/29/2011,8:31 AM

## 2011-10-29 NOTE — H&P (Signed)
Jonelle Sidle, MD Physician Signed Cardiology Consult Note 10/28/2011 11:54 AM   Clinical Summary 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 Dr. Eden Emms was consulted to see the patient on Friday 9/20 to assist with arranging a TEE for exclusion of valvular vegetations, per recommendations from ID consultation, as this will help to determine proper course of antibiotic therapy. Her surface echocardiogram did not demonstrate definite vegetations. When I learned of these plans today, the patient had unfortunately already eaten a full breakfast, had not been kept n.p.o., and was not on the schedule for TEE procedure - brief note in chart by Ms. Dunn reviewed. I spoke with Dr. Lendell Caprice, and we have arranged for TEE to occur tomorrow morning at 10 AM with Dr. [name].  Ms. Alkire denies any problems with dysphagia, active dental issues, hematemesis, or known varices. She does report an allergy to morphine, has had good pain control with Dilaudid, both by mouth and IV. She reports no problems with Versed.    Allergies   Allergen  Reactions   .  Ancef (Cefazolin Sodium)  Shortness Of Breath       wheezing   .  Coumadin (Warfarin Sodium)         Eyes bleed   .  Morphine And Related  Itching   .  Sulfa Antibiotics  Anaphylaxis and Shortness Of Breath   .  Tomato  Hives and Swelling    Medications .  epoetin alfa   2,000 Units  Intravenous  3 times weekly   .  FLUoxetine   20 mg  Oral  Daily   .  gabapentin   300 mg  Oral  BID   .  insulin aspart   0-5 Units  Subcutaneous  QHS   .  insulin aspart   0-9 Units  Subcutaneous  TID WC   .  insulin glargine   10 Units  Subcutaneous  Once   .   insulin glargine   16 Units  Subcutaneous  QHS   .  labetalol   300 mg  Oral  TID   .  multivitamin   1 tablet  Oral  Daily   .  sevelamer   800 mg  Oral  TID WC   .  sodium chloride   10-40 mL  Intracatheter  Q12H   .  sodium chloride   3 mL  Intravenous  Q12H   .  vancomycin   1,500 mg  Intravenous  Q M,W,F-HD    .Past Medical History    .  Diabetes mellitus         Insulin dependent diabetes mellitus with history of poor control   .  Hypertension     .  Hyperlipidemia     .  Nephrotic syndrome     .  Anemia         Chronic,  Dr. Mariel Sleet,  Transfused, May, 2012   .  DVT (deep venous thrombosis)         Bilateral . recurrent  coumadin   .  Hypertrophic cardiomyopathy         Diastolic dysfunction   .  Legally blind         Bilateral hemorrhagic retinal detachments   .  Vaginosis     .  History of MRSA infection         Multiple   .  Glaucoma     .  Warfarin anticoagulation         DVT   .  Manic depressive disorder     .  Dialysis patient         M-W-F   .  Acute pyelonephritis     .  Medical non-compliance      Past Surgical History   Procedure  Date   .  Left arm cyst removal     .  Incise and drain abcess  04/24/11       Multiple MRSA - March 2013 drain port of abcess   .  Placement of port a cath         Poor venous access   .  Av fistula placement     .  Hematoma evacuation  05/25/2011   .  Port-a-cath removal  07/04/2011        Family History   Problem  Relation  Age of Onset   .  Diabetes  Mother     .  Hypertension  Mother     .  Diabetes  Father      Social History Ms. Mathies reports that she has never smoked. She does not have any smokeless tobacco history on file. Ms. Esperanza reports that she does not drink alcohol.  Review of Systems No palpitations or chest pain. Stable appetite. Tolerating her hemodialysis. Otherwise negative.  Physical Examination Blood pressure 159/105, pulse 94, temperature 98.8 F (37.1 C), temperature source Oral, resp.  rate 16, height 5\' 8"  (1.727 m), weight 166 lb 10.7 oz (75.6 kg), SpO2 99.00%. Weight change: 5 lb 4.7 oz (2.4 kg) Young, chronically ill-appearing woman in no acute distress. HEENT: Conjunctiva and lids normal, oropharynx clear. Neck: Supple, no elevated JVP or carotid bruits, no thyromegaly. Lungs: Clear to auscultation, nonlabored breathing at rest. Cardiac: Regular rate and rhythm, no S3 or significant systolic murmur, no pericardial rub. Abdomen: Soft, nontender, bowel sounds present, no guarding or rebound. Extremities: No pitting edema, distal pulses 2+. Skin: Warm and dry. Musculoskeletal: No kyphosis. Neuropsychiatric: Alert and oriented x3, affect grossly appropriate.  NA  140  K  4.9   CL  100  CO2  27  GLUCOSE  198*   BUN  57*   CREATININE  7.99*   CALCIUM  9.2*    Liver Function Tests: Lab  10/22/11 0530   AST  19   ALT  18   ALKPHOS  129*   BILITOT  0.4   PROT  7.6   ALBUMIN  2.8*    CBC: Lab  10/25/11 0518  10/23/11 0527  10/22/11 0530   WBC  5.6  6.7  6.8   NEUTROABS  --  --  --   HGB  11.3*  10.8*  11.8*   HCT  34.6*  33.0*  35.7*   MCV  95.3  95.4  95.2   PLT  267  231  209    Cardiac Enzymes: Lab  10/25/11 0518   CKTOTAL  70   CKMB  --   CKMBINDEX  --   TROPONINI  --    CBG: Lab  10/28/11 0726  10/27/11 2044  10/27/11 1703  10/27/11 1134  10/27/11 0744   GLUCAP  163*  236*  182*  203*  97    Echocardiogram 9/19 Study Conclusions  -  Left ventricle: The cavity size was normal. Wall thickness was increased in a pattern of moderate LVH. Systolic function was vigorous. The estimated ejection fraction was in the range of 65% to 70%. Wall motion was normal; there were no regional wall motion abnormalities. - Right ventricle: The cavity size was normal. Wall thickness was at the upper limits of normal. - Line: No abnormalities noted. Impressions:  - Compared to the prior study performed 07/09/11, there has been no significant interval  change.  Impression  1. Methicillin Sensitive Staphylococcus Aureus bacteremia. ID consultation obtained by the primary team with recommendation for TEE to better guide duration of antibiotic therapy. Surface echocardiogram did not demonstrate obvious vegetations. She has had a history of recurrent bacteremia, also has had a left IJ dialysis catheter as potential source.  2. Previous history of DVT on Coumadin, however anticoagulation was discontinued in setting of bilateral hemorrhagic retinal detachments. Will reobtain PT/INR for baseline.  3. ESRD, on hemodialysis.  4. 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  5. Type 1 diabetes mellitus with neuropathy and chronic leg pain.  6. Morphine allergy.  Recommendations  Discussed with the patient and also Dr. Lendell Caprice. TEE has been scheduled for tomorrow morning at 10:00 AM. Orders written for n.p.o. status as well as temporary reduction in insulin. She reports good control of pain with Dilaudid, was sleeping when assessed in hemodialysis. In terms of sedation for TEE in light of morphine allergy, may want to consider low-dose combination of Versed and perhaps Dilaudid. Willl defer to Dr. [name].   Jonelle Sidle, M.D., F.A.C.C.  10/29/11  Patient examined and interviewed.  Interval event reviewed.  No change in history or exam. Chickasha Bing, MD

## 2011-10-29 NOTE — Procedures (Signed)
Procedure Note-Transesophageal Echocardiogram [name] [id number] 02/21/1985  Procedure: TEE Indications:  staph aureus sepsis  Procedure Details Consent: Risks of procedure as well as the alternatives and risks of each were explained to the (patient/caregiver).  Consent for procedure obtained. Time Out: Verified patient identification, verified procedure and site, verified correct patient position, romazicon and intubation equipment available, meds/allergies/history reviewed.  Performed  Cardiac monitor, pulse oximetry, supplemental oxygen.  Sedation given: midazolam Probe inserted without difficulty.  Evaluation Findings: See transesophageal echocardiogram report.  There was a shaggy appearance to the portion of the PICC line in the distal SVC and right atrium with at least one small mobile mass that appeared to be attached to the catheter.  These findings are consistent with either thrombus or vegetation associated with the central line.  Findings discussed with Dr. Sherrie Mustache by telephone. Complications: None Patient did tolerate procedure well  [name] 10/29/2011   11:46 AM

## 2011-10-29 NOTE — Progress Notes (Signed)
Inpatient Diabetes Program Recommendations  AACE/ADA: New Consensus Statement on Inpatient Glycemic Control (2013)  Target Ranges:  Prepandial:   less than 140 mg/dL      Peak postprandial:   less than 180 mg/dL (1-2 hours)      Critically ill patients:  140 - 180 mg/dL    Results for DAYSIE, HELF (MRN [id number]) as of 10/29/2011 09:40  Ref. Range 10/29/2011 07:43 10/29/2011 08:04  Glucose-Capillary Latest Range: 70-99 mg/dL 56 (L) 64 (L)   Hypoglycemic this morning.  Only got partial dose Lantus last night since patient was NPO after midnight.  Patient did get 3 units Novolog SSI per bedtime scale which starts coverage at 201 mg/dl (CBG last night at bedtime was 267 mg/dl).  Inpatient Diabetes Program Recommendations Insulin - Basal: Noted patient received lower dose Lantus last night (10 units Lantus). Correction (SSI): May want to d/c bedtime scheduled SSI (continue SSI at meal times). Insulin - Meal Coverage: .  Note: Will follow. Ambrose Finland RN, MSN, CDE Diabetes Coordinator Inpatient Diabetes Program [phone]

## 2011-10-29 NOTE — Progress Notes (Signed)
[name]  26 y.o.  female  Subjective: Patient denies subjective fever, rigors, chest discomfort or dyspnea.  Allergy: Ancef; Coumadin; Morphine and related; Sulfa antibiotics; and Tomato  Objective: Vital signs in last 24 hours: Temp:  [97.3 F (36.3 C)-98.4 F (36.9 C)] 98.4 F (36.9 C) (09/24 1346) Pulse Rate:  [89-103] 89  (09/24 1346) Resp:  [14-20] 17  (09/24 1346) BP: (151-212)/(94-122) 182/116 mmHg (09/24 1346) SpO2:  [97 %-100 %] 100 % (09/24 1346) Weight:  [71.215 kg (157 lb)] 71.215 kg (157 lb) (09/24 0515)  71.215 kg (157 lb) Body mass index is 23.87 kg/(m^2).  Weight change: -4.385 kg (-9 lb 10.7 oz) Last BM Date: 10/28/11  Intake/Output since admission: -1.6 L  General- Well developed; no acute distress; mildly lethargic Neck- No JVD, no carotid bruits Lungs- clear lung fields; normal I:E ratio Cardiovascular- normal PMI; normal S1; increased P2 Abdomen- normal bowel sounds; soft and non-tender without masses or organomegaly Skin- Warm, no significant lesions Extremities- Nl distal pulses; no edema  Lab Results: BMET:  Basename 10/28/11 0534 10/27/11 0640  NA 140 140  K 4.9 4.3  CL 100 98  CO2 27 31  GLUCOSE 198* 113*  BUN 57* 42*  CREATININE 7.99* 6.74*  CALCIUM 9.2 9.4   Imaging: Imaging results have been reviewed.  Transesophageal echo demonstrates material deposited on the distal central line consistent with vegetation or thrombus.  Most recent chest x-ray one week ago revealed a left IJ dialysis catheter and a right-sided PICC line.  Since the chest x-ray on 9/16 did not identify a PICC line, insertion must have occurred on the 16th or 17th.  Medications: I have reviewed the patient's current medications.  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 Neither can the possibility of thrombus on  the catheter be eliminated; however, in this clinical setting, endocarditis appears much more likely.  It will be necessary to remove the PICC line and seek other intravenous access.  Hypertension: Inadequate control. Transdermal clonidine and amlodipine added to medical regime.  LOS: 9 days   Ward Bing 10/29/2011, 6:09 PM

## 2011-10-29 NOTE — Progress Notes (Signed)
UR Chart Review Completed  

## 2011-10-30 ENCOUNTER — Encounter (HOSPITAL_COMMUNITY): Payer: Self-pay | Admitting: Internal Medicine

## 2011-10-30 ENCOUNTER — Inpatient Hospital Stay (HOSPITAL_COMMUNITY): Payer: Medicaid Other

## 2011-10-30 DIAGNOSIS — D631 Anemia in chronic kidney disease: Secondary | ICD-10-CM | POA: Diagnosis present

## 2011-10-30 LAB — BASIC METABOLIC PANEL
CO2: 28 mEq/L (ref 19–32)
Calcium: 9.4 mg/dL (ref 8.4–10.5)
Creatinine, Ser: 6.68 mg/dL — ABNORMAL HIGH (ref 0.50–1.10)
GFR calc non Af Amer: 8 mL/min — ABNORMAL LOW (ref >90)
Glucose, Bld: 227 mg/dL — ABNORMAL HIGH (ref 70–99)

## 2011-10-30 LAB — GLUCOSE, CAPILLARY
Glucose-Capillary: 249 mg/dL — ABNORMAL HIGH (ref 70–99)
Glucose-Capillary: 123 mg/dL — ABNORMAL HIGH (ref 70–99)

## 2011-10-30 LAB — CBC
MCH: 30.7 pg (ref 26.0–34.0)
MCV: 97.2 fL (ref 78.0–100.0)
Platelets: 391 10*3/uL (ref 150–400)
RDW: 16.3 % — ABNORMAL HIGH (ref 11.5–15.5)
WBC: 6.2 10*3/uL (ref 4.0–10.5)

## 2011-10-30 LAB — PHOSPHORUS: Phosphorus: 7 mg/dL — ABNORMAL HIGH (ref 2.3–4.6)

## 2011-10-30 MED ORDER — LABETALOL HCL 300 MG PO TABS: 300.0000 mg | ORAL_TABLET | Freq: Three times a day (TID) | ORAL | Status: DC

## 2011-10-30 MED ORDER — CLONIDINE HCL 0.1 MG/24HR TD PTWK: 1.0000 | MEDICATED_PATCH | TRANSDERMAL | Status: AC

## 2011-10-30 MED ORDER — SODIUM CHLORIDE 0.9 % IV SOLN: 1500.0000 mg | INTRAVENOUS | Status: DC

## 2011-10-30 MED ORDER — LABETALOL HCL 200 MG PO TABS: 400.0000 mg | ORAL_TABLET | Freq: Three times a day (TID) | ORAL | Status: DC

## 2011-10-30 MED ORDER — AMLODIPINE BESYLATE 5 MG PO TABS: 5.0000 mg | ORAL_TABLET | Freq: Every day | ORAL | Status: DC

## 2011-10-30 MED ORDER — HYDROCODONE-ACETAMINOPHEN 5-500 MG PO CAPS: 1.0000 | ORAL_CAPSULE | Freq: Four times a day (QID) | ORAL | Status: DC | PRN

## 2011-10-30 MED ORDER — AMLODIPINE BESYLATE 5 MG PO TABS
10.0000 mg | ORAL_TABLET | Freq: Every day | ORAL | Status: DC
  Administered 2011-10-30: 10 mg via ORAL
  Filled 2011-10-30: qty 1

## 2011-10-30 MED ORDER — GABAPENTIN 300 MG PO CAPS: 300.0000 mg | ORAL_CAPSULE | Freq: Two times a day (BID) | ORAL | Status: DC

## 2011-10-30 MED ORDER — INSULIN GLARGINE 100 UNIT/ML ~~LOC~~ SOLN: 12.0000 [IU] | Freq: Every day | SUBCUTANEOUS | Status: DC

## 2011-10-30 NOTE — Progress Notes (Signed)
Inpatient Diabetes Program Recommendations  AACE/ADA: New Consensus Statement on Inpatient Glycemic Control (2013)  Target Ranges:  Prepandial:   less than 140 mg/dL      Peak postprandial:   less than 180 mg/dL (1-2 hours)      Critically ill patients:  140 - 180 mg/dL    Results for KAIDEN, LICEA (MRN [id number]) as of 10/30/2011 09:06  Ref. Range 10/29/2011 07:43 10/29/2011 08:04 10/29/2011 09:12 10/29/2011 11:26 10/29/2011 16:23 10/29/2011 21:38  Glucose-Capillary Latest Range: 70-99 mg/dL 56 (L) 64 (L) 409 (H) 76 95 195 (H)   Results for CLARISSE, DEGENNARO (MRN [id number]) as of 10/30/2011 09:06  Ref. Range 10/30/2011 04:42  Glucose Latest Range: 70-99 mg/dL 956 (H)   Noted patient was hypoglycemic yesterday AM.  Lantus d/c'd as a result.  AM lab glucose elevated today.  Please see the following recommendations:  Inpatient Diabetes Program Recommendations Insulin - Basal: Please consider restarting a portion of patient's home Lantus dose- Lantus 12 units QHS (1/2 home dose) Correction (SSI): May want to d/c bedtime scheduled SSI (continue SSI at meal times). Insulin - Meal Coverage: .  Note: Will follow. Ambrose Finland RN, MSN, CDE Diabetes Coordinator Inpatient Diabetes Program [phone]

## 2011-10-30 NOTE — Progress Notes (Signed)
Dialysis tx initiated without difficulty using LUA AVF 15gax1" up/down.  Outpatient plan of care on chart / reviewed.

## 2011-10-30 NOTE — Discharge Planning (Signed)
Discharge instructions and prescriptions reviewed with patient and (patient's family) patient verbalized understanding,  All personal belongings taken home with patient, patient transported via wheelchair to front entrance by tech

## 2011-10-30 NOTE — Progress Notes (Signed)
[name]  MRN: [id number]  DOB/AGE: 07-21-1985 26 y.o.  Primary Care Physician:HASANAJ,XAJE A, MD  Admit date: 10/20/2011  Chief Complaint:  Chief Complaint  Patient presents with  . Leg Pain  . Fever  . Nausea  . Emesis  . Abdominal Pain    S-Pt presented on  10/20/2011 with  Chief Complaint  Patient presents with  . Leg Pain  . Fever  . Nausea  . Emesis  . Abdominal Pain  .    Pt today feels better. Pt seen on dialysis. Pt tolerating tx well.  Meds     . amLODipine  5 mg Oral Daily  . cloNIDine  0.2 mg Transdermal Weekly  . epoetin alfa  2,000 Units Intravenous 3 times weekly  . fentaNYL      . FLUoxetine  20 mg Oral Daily  . gabapentin  300 mg Oral BID  . insulin aspart  0-5 Units Subcutaneous QHS  . insulin aspart  0-9 Units Subcutaneous TID WC  . labetalol  300 mg Oral TID  . midazolam      . multivitamin  1 tablet Oral Daily  . sevelamer  800 mg Oral TID WC  . sodium chloride      . vancomycin  1,500 mg Intravenous Q M,W,F-HD  . DISCONTD: cloNIDine  0.3 mg Transdermal Weekly  . DISCONTD: cloNIDine  0.1 mg Oral BID  . DISCONTD: insulin glargine  16 Units Subcutaneous QHS  . DISCONTD: sodium chloride  10-40 mL Intracatheter Q12H  . DISCONTD: sodium chloride  3 mL Intravenous Q12H       Physical Exam: Vital signs in last 24 hours: Temp:  [98.2 F (36.8 C)-99.2 F (37.3 C)] 98.4 F (36.9 C) (09/25 0517) Pulse Rate:  [89-103] 91  (09/25 0517) Resp:  [14-19] 18  (09/25 0517) BP: (151-212)/(90-116) 167/98 mmHg (09/25 0517) SpO2:  [97 %-100 %] 99 % (09/25 0517) Weight:  [166 lb 12.8 oz (75.66 kg)] 166 lb 12.8 oz (75.66 kg) (09/25 0435) Weight change: 9 lb 12.8 oz (4.445 kg) Last BM Date: 10/28/11  Intake/Output from previous day: 09/24 0701 - 09/25 0700 In: 440 [P.O.:440] Out: 600 [Urine:600] Total I/O In: 200 [P.O.:200] Out: 100 [Urine:100]   Physical Exam: General- pt is awake,alert, oriented to time place and person Resp- No  acute REsp distress, CTA B/L NO Rhonchi CVS- S1S2 regular ij rate and rhythm GIT- BS+, soft, NT, ND EXT- NO LE Edema, Cyanosis   Lab Results: CBC Results for [name] (MRN [id number]) as of 10/30/2011 16:03  Ref. Range 10/30/2011 04:42  WBC Latest Range: 4.0-10.5 K/uL 6.2  RBC Latest Range: 3.87-5.11 MIL/uL 3.52 (L)  Hemoglobin Latest Range: 12.0-15.0 g/dL 25.9 (L)  HCT Latest Range: 36.0-46.0 % 34.2 (L)  MCV Latest Range: 78.0-100.0 fL 97.2  MCH Latest Range: 26.0-34.0 pg 30.7  MCHC Latest Range: 30.0-36.0 g/dL 56.3  RDW Latest Range: 11.5-15.5 % 16.3 (H)  Platelets Latest Range: 150-400 K/uL 391   BMET  Basename 10/28/11 0534 10/27/11 0640  NA 140 140  K 4.9 4.3  CL 100 98  CO2 27 31  GLUCOSE 198* 113*  BUN 57* 42*  CREATININE 7.99* 6.74*  CALCIUM 9.2 9.4    MICRO Recent Results (from the past 240 hour(s))  CULTURE, BLOOD (ROUTINE X 2)     Status: Normal   Collection Time   10/21/11  4:27 AM      Component Value Range Status Comment   Specimen Description BLOOD RIGHT HAND  Final    Special Requests BOTTLES DRAWN AEROBIC ONLY 5CC   Final    Culture  Setup Time 10/22/2011 02:53   Final    Culture     Final    Value: STAPHYLOCOCCUS AUREUS     Note: RIFAMPIN AND GENTAMICIN SHOULD NOT BE USED AS SINGLE DRUGS FOR TREATMENT OF STAPH INFECTIONS.     Note: Gram Stain Report Called to,Read Back By and Verified With: Sturgis B @ [phone] BY FORSYTH K Performed at [facility]   Report Status 10/24/2011 FINAL   Final    Organism ID, Bacteria STAPHYLOCOCCUS AUREUS   Final   CULTURE, BLOOD (ROUTINE X 2)     Status: Normal   Collection Time   10/21/11  4:47 AM      Component Value Range Status Comment   Specimen Description BLOOD RIGHT ARM   Final    Special Requests BOTTLES DRAWN AEROBIC ONLY 5CC   Final    Culture  Setup Time 10/21/2011 21:45   Final    Culture     Final    Value: STAPHYLOCOCCUS AUREUS     Note: SUSCEPTIBILITIES PERFORMED ON PREVIOUS  CULTURE WITHIN THE LAST 5 DAYS.     Note: Gram Stain Report Called to,Read Back By and Verified With: Gean Quint AT [phone] BY FORSYTH K Performed at [facility]   Report Status 10/24/2011 FINAL   Final   MRSA PCR SCREENING     Status: Normal   Collection Time   10/21/11  5:18 AM      Component Value Range Status Comment   MRSA by PCR NEGATIVE  NEGATIVE Final   URINE CULTURE     Status: Normal   Collection Time   10/23/11  6:30 PM      Component Value Range Status Comment   Specimen Description URINE, CLEAN CATCH   Final    Special Requests NONE   Final    Culture  Setup Time 10/24/2011 01:34   Final    Colony Count >=100,000 COLONIES/ML   Final    Culture     Final    Value: VANCOMYCIN RESISTANT ENTEROCOCCUS ISOLATED     Note: CRITICAL RESULT CALLED TO, READ BACK BY AND VERIFIED WITH: CHRISTINA K@[id number]  ON [id number] BY [facility]   Report Status 10/28/2011 FINAL   Final    Organism ID, Bacteria VANCOMYCIN RESISTANT ENTEROCOCCUS ISOLATED   Final   SURGICAL PCR SCREEN     Status: Abnormal   Collection Time   10/24/11  9:26 PM      Component Value Range Status Comment   MRSA, PCR NEGATIVE  NEGATIVE Final    Staphylococcus aureus POSITIVE (*) NEGATIVE Final   CATH TIP CULTURE     Status: Normal   Collection Time   10/25/11  8:57 AM      Component Value Range Status Comment   Specimen Description CATH TIP HEMODIALYSIS CATHETER   Final    Special Requests NONE   Final    Culture     Final    Value: 25 COLONIES STAPHYLOCOCCUS AUREUS     Note: RIFAMPIN AND GENTAMICIN SHOULD NOT BE USED AS SINGLE DRUGS FOR TREATMENT OF STAPH INFECTIONS.   Report Status 10/29/2011 FINAL   Final    Organism ID, Bacteria STAPHYLOCOCCUS AUREUS   Final   CULTURE, BLOOD (ROUTINE X 2)     Status: Normal (Preliminary result)   Collection Time   10/26/11  8:00 AM  Component Value Range Status Comment   Specimen Description BLOOD PICC LINE DRAWN BY RN   Final    Special Requests     Final    Value: BOTTLES  DRAWN AEROBIC AND ANAEROBIC 6CC EACH BOTTLE   Culture NO GROWTH 3 DAYS   Final    Report Status PENDING   Incomplete   CULTURE, BLOOD (ROUTINE X 2)     Status: Normal (Preliminary result)   Collection Time   10/26/11  8:05 AM      Component Value Range Status Comment   Specimen Description BLOOD RIGHT HAND   Final    Special Requests BOTTLES DRAWN AEROBIC ONLY 4CC   Final    Culture NO GROWTH 3 DAYS   Final    Report Status PENDING   Incomplete       Lab Results  Component Value Date   PTH 256.0* 07/04/2011   CALCIUM 9.2 10/28/2011   CAION 1.16 06/04/2010   PHOS 6.4* 10/27/2011         Impression: 1)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  Pt tolerating tx well.   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  Secondary Hyperparathyroidism present.  Phosphorus NOT at goal. -on binders   5)ID- Sepsis/Endocarditis On Vanco  The patient to receive vancomycin with dialysis Monday/Wednesday/ Friday till November 29, 2011.   6)FEN  Normokalemic  Hyponatremic-secondary to ESRD   7)Acid base  Co2 at goal   Plan:  Agree with current tx and plan Will communicate with HD unit to make sure about Vanco 1500mg  till  11/29/11      Terryn Rosenkranz S 10/30/2011, 6:02 AM

## 2011-10-30 NOTE — Discharge Summary (Addendum)
Physician Discharge Summary  [name]:[id number] DOB: 21-Sep-1985 DOA: 10/20/2011  PCP: [name], MD  Admit date: 10/20/2011 Discharge date: 10/30/2011  Recommendations for Outpatient Follow-up:  1. The patient was discharged to home in improved condition. She will followup with her primary care physician as scheduled. The patient should receive vancomycin with dialysis every Monday, Wednesday, and Friday through the end of November 29, 2011.  Repeat blood cultures should be ordered in 3-4 weeks.  Discharge Diagnoses:  1. 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 (Likely associated with the dialysis catheter which was removed). 2. Urinary vancomycin resistant enterococcus colonization. 3. End stage renal disease, on hemodialysis every Monday, Wednesday, and Friday. 4. Hyperkalemia secondary to end-stage renal disease. 5. Type 1 diabetes mellitus with nephropathy. 6. Sinus tachycardia secondary to sepsis and fever. Resolved. 7. Malignant hypertension. 8. Bilateral leg pain, consistent with neuropathy. 9. 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 10. One episode of mildly symptomatic hypoglycemia associated with diabetes. 11. Anemia of end-stage renal disease. 12. History of noncompliance.   Discharge Condition: Improved.  Diet recommendation: Renal and carbohydrate modified.  Filed Weights   10/28/11 0500 10/29/11 0515 10/30/11 0435  Weight: 75.6 kg (166 lb 10.7 oz) 71.215 kg (157 lb) 75.66 kg (166 lb 12.8 oz)    History of present illness:  The patient is a 26 year old woman with a history significant for end-stage renal disease, diabetes mellitus, and blindness from hemorrhagic retinal detachments following Coumadin therapy for DVT, who presented to the emergency department on 10/21/2011 with a chief complaint of nausea, vomiting,  and abdominal pain. In the emergency department, she was tachycardic with a heart rate of 141 beats per minute, hypertensive with blood pressure 151/99, and febrile with temperature 102F. Lab data were significant for a urine protein of greater than 300, urine glucose of 250, large urine hemoglobin, normal WBC of 5.5, potassium of 6.1, creatinine of 8.79, BUN of 53, and glucose of 262. Her might her urine revealed 11-20 WBCs, too numerous to count RBCs, and many bacteria. CT scan of her abdomen and pelvis revealed edematous appearance of the kidneys bilaterally, worse on the left, but no obstructing stones identified. Her chest x-ray revealed no acute cardiopulmonary abnormalities. Her EKG reveals sinus tachycardia with a heart rate of 125 beats per minute and nonspecific ST changes. She was admitted for further evaluation and management.  Hospital Course:  Blood cultures and urine culture were ordered in the emergency department. Cipro and vancomycin were started for treatment of the urinary tract infection and sepsis in the setting of a previously known history of staph bacteremia. Although she is stage renal disease, she was thought to be volume depleted with fever and tachycardia, and therefore, gentle IV fluids were started. Kayexalate was given for treatment of hyperkalemia. Nephrologist, Dr. Kristian [name] was consulted for end-stage renal disease treatment and treatment of associated hyperkalemia and hypophosphatemia with hemodialysis. The patient had very poor venous access, and therefore, a right upper extremity PICC line was inserted. A day or 2 following admission, her blood cultures became positive for gram-positive cocci which was eventually identified as methicillin sensitive Staphylococcus aureus. Following this discovery, a 2-D echocardiogram was ordered to evaluate for gross vegetations. The transthoracic echocardiogram revealed no vegetations. Infectious diseases physician Dr. Ninetta [name] was consulted.  Given her history of MRSA bacteremia associated with a previously infected dialysis catheter it was felt that her current bacteremia  was likely secondary to the dialysis catheter that had been placed following the previous infection. Therefore, general surgeon Dr. Leticia [name] was consulted to remove the dialysis catheter. Dialysis was continued via an AV graft in her left arm that had been maturing. It was accessed successfully. Dr. Ninetta [name] recommended 4 weeks of IV antibiotics and a TEE. The TEE was performed by cardiologist Dr. Dietrich [name]. The results revealed a small mole mass that was attached to the PICC line catheter. Findings were consistent with either a thrombus or vegetation associated with the central line in the distal SVC and right atrium. In this clinical setting, endocarditis appeared much more likely, according to Dr. Dietrich [name]. The PICC line was removed immediately following the results of the TEE. Dr. Ninetta [name] was notified of the findings. He recommended a total of 6 weeks of IV antibiotics, namely vancomycin and a repeat set of blood cultures in 3-4 weeks.  An additional set of blood cultures were ordered several days prior to discharge. They have remained negative to date. Also, the patient's urine culture grew out VRE. Given her clinical improvement on Cipro and vancomycin, the VRE was thought to be a colonization and not a true active infection, although, it was initially thought that she did have a urinary tract infection. If she truly had a VRE urinary tract infection, she would not have improved on Cipro and vancomycin. Cipro was eventually discontinued.  Throughout the hospitalization, the patient had complaints of bilateral leg pain, thought to be secondary to neuropathic pain. Bilateral lower extremity venous ultrasound revealed no evidence of DVT. She was treated with as needed opiate analgesics. Gabapentin was started which appeared to have helped some. Her diabetes control was somewhat  labile. She did have one mildly symptomatic episode of hypoglycemia when her blood glucose fell to the 50s. She was treated appropriately with D50. Insulin was adjusted on multiple occasions. Her hemoglobin A1c was 7.7. Her thyroid function was assessed. Her TSH was within normal limits at 1.4 and her free T4 was within normal limits at 1.03.  The patient's blood pressure was not optimal during the hospitalization. Her antihypertensive medications were titrated up accordingly. Labetalol was increased from twice a day to 3 times a day. Norvasc and Catapres patch were added. Further management/treatment will be deferred to her primary care physician Dr. Olena Leatherwood and nephrologist Dr. Kristian [name] in the outpatient setting.   The patient improved clinically and symptomatically. She became afebrile and remained afebrile throughout the hospitalization. Her nausea vomiting, and abdominal pain resolved. Her diet was advanced which he tolerated well. Arrangements were made for her to receive vancomycin with hemodialysis on every Monday, Wednesday, and Friday for a total of 6 weeks which in on November 29, 2011.   Procedures: 1. TEE on 10/29/2011. There was a shaggy appearance to the portion of the PICC line in the distal SVC and right atrium with at least one small mobile mass that appeared to be attached to the catheter. These findings are consistent with either thrombus or vegetation associated with the central line.; however, tricuspid valve endocarditis cannot be excluded. In this clinical setting, endocarditis appears much more likely.  2. Insertion of PICC line on 10/22/2011 and removal on 10/29/2011 3. Removal of hemodialysis catheter 10/25/2011 4. Hemodialysis. 5. ECHO 10/24/2011:Study Conclusions  - Left ventricle: The cavity size was normal. Wall thickness was increased in a pattern of moderate LVH. Systolic function was vigorous. The estimated ejection fraction was in the range of 65% to 70%. Wall motion  was  normal; there were no regional wall motion abnormalities. - Right ventricle: The cavity size was normal. Wall thickness was at the upper limits of normal. - Line: No abnormalities noted. Impressions:  - Compared to the prior study performed 07/09/11, there has been no significant interval change. Transthoracic echocardiography. M-mode, complete 2D, spectral Doppler, and color Doppler. Height: Height: 172.7cm. Height: 68in. Weight: Weight: 69.4kg. Weight: 152.7lb. Body mass index: BMI: 23.3kg/m^2. Body surface area: BSA: 1.14m^2. Patient status: Inpatient.     Consultations:  Nephrologist Dr. Kristian [name]; cardiologist, Dr. Dietrich [name]; general surgeon Dr. Leticia [name]; and curbside consultation with infectious diseases physician Dr. Ninetta [name].  Discharge Exam: Filed Vitals:   10/29/11 1346 10/29/11 2045 10/30/11 0435 10/30/11 0517  BP: 182/116 167/90  167/98  Pulse: 89 100  91  Temp: 98.4 F (36.9 C) 99.2 F (37.3 C)  98.4 F (36.9 C)  TempSrc: Oral Oral  Oral  Resp: 17 16  18   Height:      Weight:   75.66 kg (166 lb 12.8 oz)   SpO2: 100% 98%  99%    General: Alert 26 year old African American woman sitting up in bed, eating, in no acute distress. Cardiovascular: S1, S2, with a soft 2/6 systolic murmur. Respiratory: Clear to auscultation bilaterally.  Discharge Instructions  Discharge Orders    Future Orders Please Complete By Expires   Diet - low sodium heart healthy      Diet Carb Modified      Increase activity slowly          Medication List     As of 10/30/2011 11:20 AM    TAKE these medications         amLODipine 5 MG tablet   Commonly known as: NORVASC   Take 1 tablet (5 mg total) by mouth daily. For treatment of high blood pressure.      cloNIDine 0.1 mg/24hr patch   Commonly known as: CATAPRES - Dosed in mg/24 hr   Place 1 patch (0.1 mg total) onto the skin once a week. For treatment of high blood pressure.      FLUoxetine 20 MG capsule   Commonly known  as: PROZAC   Take 20 mg by mouth daily.      gabapentin 300 MG capsule   Commonly known as: NEURONTIN   Take 1 capsule (300 mg total) by mouth 2 (two) times daily. For treatment of nerve pain in the legs.      hydrocodone-acetaminophen 5-500 MG per capsule   Commonly known as: LORCET-HD   Take 1 capsule by mouth every 6 (six) hours as needed for pain.      insulin glargine 100 UNIT/ML injection   Commonly known as: LANTUS   Inject 12 Units into the skin at bedtime.      insulin lispro 100 UNIT/ML injection   Commonly known as: HUMALOG   Inject 5-20 Units into the skin 3 (three) times daily before meals. Per sliding scale. Pt takes anywhere from 5 to 20 units.      labetalol 300 MG tablet   Commonly known as: NORMODYNE   Take 1 tablet (300 mg total) by mouth 3 (three) times daily. For treatment of high blood pressure. The dose was increased to 1 tablet 3 times daily.      multivitamin Tabs tablet   Take 1 tablet by mouth daily.      sevelamer 800 MG tablet   Commonly known as: RENVELA   Take 800 mg by mouth 3 (three) times daily  with meals.      sodium chloride 0.9 % SOLN 250 mL with vancomycin 1000 MG SOLR 1,500 mg   Inject 1,500 mg into the vein every Monday, Wednesday, and Friday with hemodialysis. Continue through the end of 11/29/2011 for treatment of endocarditis.           Follow-up Information    Follow up with [facility], MD. On 11/15/2011. (Follow up 11/15/2011 @ 1:40)    Contact information:   [address]  [place] [phone]          The results of significant diagnostics from this hospitalization (including imaging, microbiology, ancillary and laboratory) are listed below for reference.    Significant Diagnostic Studies: Ct Abdomen Pelvis Wo Contrast  10/21/2011  *RADIOLOGY REPORT*  Clinical Data: Abdominal pain.  CT ABDOMEN AND PELVIS WITHOUT CONTRAST  Technique:  Multidetector CT imaging of the abdomen and pelvis was performed following the  standard protocol without intravenous contrast.  Comparison: CT abdomen and pelvis 06/30/2011, 06/18/2011 and 06/13/2010.  Findings: There is some dependent atelectasis in the lung bases. No pleural or pericardial effusion.  The kidneys appear edematous bilaterally, worse on the left.  There is some stranding about both kidneys, also worse on the left. Mild fullness of the left renal collecting system is noted.  No urinary tract stones are identified.  No focal fluid collection is seen. The gallbladder is unremarkable.  The spleen, adrenal glands and pancreas appear normal.  No focal liver lesion is seen.  The uterus, adnexa and urinary bladder are unremarkable.  No lymphadenopathy is identified.  No focal bony abnormality.  IMPRESSION:  Edematous appearance of the kidneys bilaterally, worse on the left, with mild fullness of the left renal collecting system noted.  No obstructing stone is identified.  Question pyelonephritis. Findings discussed with Dr. Dierdre Highman at the time of interpretation.   Original Report Authenticated By: Bernadene Bell. Maricela Curet, M.D.    Dg Chest 2 View  10/21/2011  *RADIOLOGY REPORT*  Clinical Data: Weakness and abdominal pain.  CHEST - 2 VIEW  Comparison: Plain film of the chest 09/19/2011.  Findings: Left IJ approach dialysis catheter remains in place. Lungs are clear.  No edema, pneumothorax or pleural fluid.  Heart size normal.  IMPRESSION: No acute disease.   Original Report Authenticated By: Bernadene Bell. Maricela Curet, M.D.    US Venous Img Lower Bilateral  10/21/2011  *RADIOLOGY REPORT*  Clinical Data: Bilateral leg pain.  Dialysis.  BILATERAL LOWER EXTREMITY VENOUS DUPLEX ULTRASOUND  Technique:  Gray-scale sonography with graded compression, as well as color Doppler and duplex ultrasound, were performed to evaluate the deep venous system of both lower extremities from the level of the common femoral vein through the popliteal and proximal calf veins.  Spectral Doppler was utilized to  evaluate flow at rest and with distal augmentation maneuvers.  Comparison:  12/24/2009.  05/17/2009.  Findings:  Normal compressibility of bilateral common femoral, superficial femoral, and popliteal veins is demonstrated, as well as the visualized proximal calf veins.  No filling defects to suggest DVT on grayscale or color Doppler imaging.  Doppler waveforms show normal direction of venous flow, normal respiratory phasicity and response to augmentation.  Again noted is slight mural thrombus extending from the right femoral vein into the right common femoral vein and greater saphenous vein similar that seen on prior studies, representing chronic DVT.  IMPRESSION: No evidence of acute deep vein thrombosis in either lower extremity.   Original Report Authenticated By: Elsie Stain,  M.D.    Dg Chest Port 1 View  10/22/2011  *RADIOLOGY REPORT*  Clinical Data: Right-sided PICC line placement.  PORTABLE CHEST - 1 VIEW  Comparison: Two-view chest 10/21/2011.  The  Findings: A left IJ dialysis catheter is stable.  New right-sided PICC line has been placed with the tip is at the cavoatrial junction.  Heart size is normal.  The lungs are clear.  IMPRESSION:  1.  Interval placement of right-sided PICC line.  The tip is at the cavoatrial junction. 2.  Stable left IJ dialysis catheter. 3.  No acute cardiopulmonary disease.   Original Report Authenticated By: Jamesetta Orleans. MATTERN, M.D.     Microbiology: Recent Results (from the past 240 hour(s))  CULTURE, BLOOD (ROUTINE X 2)     Status: Normal   Collection Time   10/21/11  4:27 AM      Component Value Range Status Comment   Specimen Description BLOOD RIGHT HAND   Final    Special Requests BOTTLES DRAWN AEROBIC ONLY 5CC   Final    Culture  Setup Time 10/22/2011 02:53   Final    Culture     Final    Value: STAPHYLOCOCCUS AUREUS     Note: RIFAMPIN AND GENTAMICIN SHOULD NOT BE USED AS SINGLE DRUGS FOR TREATMENT OF STAPH INFECTIONS.     Note: Gram Stain Report  Called to,Read Back By and Verified With: Gruet[place]-Laager B @ [phone] BY FORSYTH K Performed at [facility]   Report Status 10/24/2011 FINAL   Final    Organism ID, Bacteria STAPHYLOCOCCUS AUREUS   Final   CULTURE, BLOOD (ROUTINE X 2)     Status: Normal   Collection Time   10/21/11  4:47 AM      Component Value Range Status Comment   Specimen Description BLOOD RIGHT ARM   Final    Special Requests BOTTLES DRAWN AEROBIC ONLY 5CC   Final    Culture  Setup Time 10/21/2011 21:45   Final    Culture     Final    Value: STAPHYLOCOCCUS AUREUS     Note: SUSCEPTIBILITIES PERFORMED ON PREVIOUS CULTURE WITHIN THE LAST 5 DAYS.     Note: Gram Stain Report Called to,Read Back By and Verified With: Gean Quint AT [id number] BY FORSYTH K Performed at [facility]   Report Status 10/24/2011 FINAL   Final   MRSA PCR SCREENING     Status: Normal   Collection Time   10/21/11  5:18 AM      Component Value Range Status Comment   MRSA by PCR NEGATIVE  NEGATIVE Final   URINE CULTURE     Status: Normal   Collection Time   10/23/11  6:30 PM      Component Value Range Status Comment   Specimen Description URINE, CLEAN CATCH   Final    Special Requests NONE   Final    Culture  Setup Time 10/24/2011 01:34   Final    Colony Count >=100,000 COLONIES/ML   Final    Culture     Final    Value: VANCOMYCIN RESISTANT ENTEROCOCCUS ISOLATED     Note: CRITICAL RESULT CALLED TO, READ BACK BY AND VERIFIED WITH: CHRISTINA K@[id number]  ON [id number] BY [facility]   Report Status 10/28/2011 FINAL   Final    Organism ID, Bacteria VANCOMYCIN RESISTANT ENTEROCOCCUS ISOLATED   Final   SURGICAL PCR SCREEN     Status: Abnormal   Collection Time   10/24/11  9:26  PM      Component Value Range Status Comment   MRSA, PCR NEGATIVE  NEGATIVE Final    Staphylococcus aureus POSITIVE (*) NEGATIVE Final   CATH TIP CULTURE     Status: Normal   Collection Time   10/25/11  8:57 AM      Component Value Range Status Comment   Specimen Description  CATH TIP HEMODIALYSIS CATHETER   Final    Special Requests NONE   Final    Culture     Final    Value: 25 COLONIES STAPHYLOCOCCUS AUREUS     Note: RIFAMPIN AND GENTAMICIN SHOULD NOT BE USED AS SINGLE DRUGS FOR TREATMENT OF STAPH INFECTIONS.   Report Status 10/29/2011 FINAL   Final    Organism ID, Bacteria STAPHYLOCOCCUS AUREUS   Final   CULTURE, BLOOD (ROUTINE X 2)     Status: Normal (Preliminary result)   Collection Time   10/26/11  8:00 AM      Component Value Range Status Comment   Specimen Description BLOOD PICC LINE DRAWN BY RN   Final    Special Requests     Final    Value: BOTTLES DRAWN AEROBIC AND ANAEROBIC 6CC EACH BOTTLE   Culture NO GROWTH 4 DAYS   Final    Report Status PENDING   Incomplete   CULTURE, BLOOD (ROUTINE X 2)     Status: Normal (Preliminary result)   Collection Time   10/26/11  8:05 AM      Component Value Range Status Comment   Specimen Description BLOOD RIGHT HAND   Final    Special Requests BOTTLES DRAWN AEROBIC ONLY 4CC   Final    Culture NO GROWTH 4 DAYS   Final    Report Status PENDING   Incomplete      Labs: Basic Metabolic Panel:  Lab 10/30/11 1610 10/28/11 0534 10/27/11 0640 10/25/11 0518  NA 137 140 140 133*  K 5.4* 4.9 4.3 3.8  CL 98 100 98 94*  CO2 28 27 31 27   GLUCOSE 227* 198* 113* 216*  BUN 46* 57* 42* 42*  CREATININE 6.68* 7.99* 6.74* 7.40*  CALCIUM 9.4 9.2 9.4 9.4  MG -- -- -- --  PHOS 7.0* -- 6.4* --   Liver Function Tests: No results found for this basename: AST:5,ALT:5,ALKPHOS:5,BILITOT:5,PROT:5,ALBUMIN:5 in the last 168 hours No results found for this basename: LIPASE:5,AMYLASE:5 in the last 168 hours No results found for this basename: AMMONIA:5 in the last 168 hours CBC:  Lab 10/30/11 0442 10/25/11 0518  WBC 6.2 5.6  NEUTROABS -- --  HGB 10.8* 11.3*  HCT 34.2* 34.6*  MCV 97.2 95.3  PLT 391 267   Cardiac Enzymes:  Lab 10/25/11 0518  CKTOTAL 70  CKMB --  CKMBINDEX --  TROPONINI --   BNP: BNP (last 3  results) No results found for this basename: PROBNP:3 in the last 8760 hours CBG:  Lab 10/30/11 0744 10/29/11 2138 10/29/11 1623 10/29/11 1126 10/29/11 0912  GLUCAP 191* 195* 95 76 139*    Time coordinating discharge: Greater than 30 minutes  Signed:  Carleen Rhue  [facility] 10/30/2011, 11:20 AM

## 2011-10-30 NOTE — Progress Notes (Signed)
Subjective:  No specific complaints.  Objective:  Vital Signs in the last 24 hours: Temp:  [98.2 F (36.8 C)-99.2 F (37.3 C)] 98.4 F (36.9 C) (09/25 0517) Pulse Rate:  [89-103] 91  (09/25 0517) Resp:  [14-19] 18  (09/25 0517) BP: (151-212)/(90-116) 167/98 mmHg (09/25 0517) SpO2:  [97 %-100 %] 99 % (09/25 0517) Weight:  [166 lb 12.8 oz (75.66 kg)] 166 lb 12.8 oz (75.66 kg) (09/25 0435)  Intake/Output from previous day: 09/24 0701 - 09/25 0700 In: 440 [P.O.:440] Out: 600 [Urine:600] Physical Exam: NECK: Without JVD, HJR, or bruit LUNGS: Clear anterior, posterior, lateral HEART: Regular rate and rhythm, no murmur, gallop, rub, bruit, thrill, or heave EXTREMITIES: Without cyanosis, clubbing, or edema  Lab Results:  [facility] 10/30/11 0442  WBC 6.2  HGB 10.8*  PLT 391    Basename 10/30/11 0442 10/28/11 0534  NA 137 140  K 5.4* 4.9  CL 98 100  CO2 28 27  GLUCOSE 227* 198*  BUN 46* 57*  CREATININE 6.68* 7.99*   Assessment/Plan:  Endovascular Infection: methicillin sensitive staph aureus bacteremia.  PICC line was removed. On IV Vancomycin  Hypertension: Inadequate control. Transdermal clonidine and amlodipine added to medical regime. BP still high today. For dialysis. May need to increase amlodipine. On Clonidine 0.2mg  weekly, labetalol 300 mg tid, norvasc 5mg .   Previous history of DVT on Coumadin, however anticoagulation was discontinued in setting of bilateral hemorrhagic retinal detachments.    ESRD, on hemodialysis.   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   Type 1 diabetes mellitus with neuropathy and chronic leg pain.   Morphine allergy.  Jacolyn Reedy 10/30/2011, 8:29 AM  Cardiology Attending Patient interviewed and examined. Discussed with Jacolyn Reedy, PA-C.  Above note annotated and modified based upon my findings.  Hypertension is markedly improved, but still not optimally controlled. Medical therapy will be increased. Patient has no  leukocytosis nor fever. She will need surveillance cultures and a repeat TEE following adequate antibiotic therapy.   Bing, MD 10/30/2011, 5:43 PM

## 2011-10-31 LAB — CULTURE, BLOOD (ROUTINE X 2)

## 2011-11-01 ENCOUNTER — Encounter (HOSPITAL_COMMUNITY): Payer: Self-pay | Admitting: Cardiology

## 2011-12-23 ENCOUNTER — Ambulatory Visit: Payer: Self-pay | Admitting: Vascular Surgery

## 2011-12-23 LAB — HCG, QUANTITATIVE, PREGNANCY: Beta Hcg, Quant.: 1 m[IU]/mL

## 2011-12-27 ENCOUNTER — Encounter (HOSPITAL_COMMUNITY): Payer: Self-pay | Admitting: *Deleted

## 2011-12-27 ENCOUNTER — Emergency Department (HOSPITAL_COMMUNITY): Payer: Medicaid Other

## 2011-12-27 ENCOUNTER — Inpatient Hospital Stay (HOSPITAL_COMMUNITY)
Admission: EM | Admit: 2011-12-27 | Discharge: 2011-12-31 | DRG: 314 | Disposition: A | Discharge status: Home / Self Care | Payer: Medicaid Other | Attending: Internal Medicine | Admitting: Internal Medicine

## 2011-12-27 DIAGNOSIS — E1049 Type 1 diabetes mellitus with other diabetic neurological complication: Secondary | ICD-10-CM | POA: Diagnosis present

## 2011-12-27 DIAGNOSIS — Z765 Malingerer [conscious simulation]: Secondary | ICD-10-CM

## 2011-12-27 DIAGNOSIS — R109 Unspecified abdominal pain: Secondary | ICD-10-CM

## 2011-12-27 DIAGNOSIS — H548 Legal blindness, as defined in USA: Secondary | ICD-10-CM | POA: Diagnosis present

## 2011-12-27 DIAGNOSIS — Z86718 Personal history of other venous thrombosis and embolism: Secondary | ICD-10-CM

## 2011-12-27 DIAGNOSIS — A419 Sepsis, unspecified organism: Secondary | ICD-10-CM

## 2011-12-27 DIAGNOSIS — B952 Enterococcus as the cause of diseases classified elsewhere: Secondary | ICD-10-CM

## 2011-12-27 DIAGNOSIS — A4901 Methicillin susceptible Staphylococcus aureus infection, unspecified site: Secondary | ICD-10-CM | POA: Diagnosis present

## 2011-12-27 DIAGNOSIS — E1043 Type 1 diabetes mellitus with diabetic autonomic (poly)neuropathy: Secondary | ICD-10-CM

## 2011-12-27 DIAGNOSIS — I10 Essential (primary) hypertension: Secondary | ICD-10-CM

## 2011-12-27 DIAGNOSIS — E1029 Type 1 diabetes mellitus with other diabetic kidney complication: Secondary | ICD-10-CM

## 2011-12-27 DIAGNOSIS — R000 Tachycardia, unspecified: Secondary | ICD-10-CM

## 2011-12-27 DIAGNOSIS — Y92009 Unspecified place in unspecified non-institutional (private) residence as the place of occurrence of the external cause: Secondary | ICD-10-CM

## 2011-12-27 DIAGNOSIS — Z833 Family history of diabetes mellitus: Secondary | ICD-10-CM

## 2011-12-27 DIAGNOSIS — K319 Disease of stomach and duodenum, unspecified: Secondary | ICD-10-CM | POA: Diagnosis present

## 2011-12-27 DIAGNOSIS — T827XXA Infection and inflammatory reaction due to other cardiac and vascular devices, implants and grafts, initial encounter: Principal | ICD-10-CM | POA: Diagnosis present

## 2011-12-27 DIAGNOSIS — Z22338 Carrier of other streptococcus: Secondary | ICD-10-CM

## 2011-12-27 DIAGNOSIS — K3189 Other diseases of stomach and duodenum: Secondary | ICD-10-CM

## 2011-12-27 DIAGNOSIS — R7881 Bacteremia: Secondary | ICD-10-CM | POA: Diagnosis present

## 2011-12-27 DIAGNOSIS — R531 Weakness: Secondary | ICD-10-CM

## 2011-12-27 DIAGNOSIS — E875 Hyperkalemia: Secondary | ICD-10-CM

## 2011-12-27 DIAGNOSIS — Z9119 Patient's noncompliance with other medical treatment and regimen: Secondary | ICD-10-CM

## 2011-12-27 DIAGNOSIS — R509 Fever, unspecified: Secondary | ICD-10-CM

## 2011-12-27 DIAGNOSIS — I120 Hypertensive chronic kidney disease with stage 5 chronic kidney disease or end stage renal disease: Secondary | ICD-10-CM | POA: Diagnosis present

## 2011-12-27 DIAGNOSIS — M79604 Pain in right leg: Secondary | ICD-10-CM

## 2011-12-27 DIAGNOSIS — N186 End stage renal disease: Secondary | ICD-10-CM

## 2011-12-27 DIAGNOSIS — D631 Anemia in chronic kidney disease: Secondary | ICD-10-CM | POA: Diagnosis present

## 2011-12-27 DIAGNOSIS — E1142 Type 2 diabetes mellitus with diabetic polyneuropathy: Secondary | ICD-10-CM | POA: Diagnosis present

## 2011-12-27 DIAGNOSIS — K529 Noninfective gastroenteritis and colitis, unspecified: Secondary | ICD-10-CM

## 2011-12-27 DIAGNOSIS — E785 Hyperlipidemia, unspecified: Secondary | ICD-10-CM | POA: Diagnosis present

## 2011-12-27 DIAGNOSIS — Z8249 Family history of ischemic heart disease and other diseases of the circulatory system: Secondary | ICD-10-CM

## 2011-12-27 DIAGNOSIS — Y841 Kidney dialysis as the cause of abnormal reaction of the patient, or of later complication, without mention of misadventure at the time of the procedure: Secondary | ICD-10-CM | POA: Diagnosis present

## 2011-12-27 DIAGNOSIS — T82898A Other specified complication of vascular prosthetic devices, implants and grafts, initial encounter: Secondary | ICD-10-CM

## 2011-12-27 DIAGNOSIS — E11649 Type 2 diabetes mellitus with hypoglycemia without coma: Secondary | ICD-10-CM

## 2011-12-27 DIAGNOSIS — B9561 Methicillin susceptible Staphylococcus aureus infection as the cause of diseases classified elsewhere: Secondary | ICD-10-CM

## 2011-12-27 DIAGNOSIS — Z882 Allergy status to sulfonamides status: Secondary | ICD-10-CM

## 2011-12-27 DIAGNOSIS — E1069 Type 1 diabetes mellitus with other specified complication: Secondary | ICD-10-CM | POA: Diagnosis present

## 2011-12-27 DIAGNOSIS — R1084 Generalized abdominal pain: Secondary | ICD-10-CM

## 2011-12-27 DIAGNOSIS — Z91199 Patient's noncompliance with other medical treatment and regimen due to unspecified reason: Secondary | ICD-10-CM

## 2011-12-27 DIAGNOSIS — H547 Unspecified visual loss: Secondary | ICD-10-CM

## 2011-12-27 DIAGNOSIS — F319 Bipolar disorder, unspecified: Secondary | ICD-10-CM | POA: Diagnosis present

## 2011-12-27 DIAGNOSIS — I38 Endocarditis, valve unspecified: Secondary | ICD-10-CM

## 2011-12-27 DIAGNOSIS — N390 Urinary tract infection, site not specified: Secondary | ICD-10-CM

## 2011-12-27 DIAGNOSIS — N189 Chronic kidney disease, unspecified: Secondary | ICD-10-CM

## 2011-12-27 HISTORY — DX: Type 1 diabetes mellitus with diabetic autonomic (poly)neuropathy: E10.43

## 2011-12-27 HISTORY — DX: Disease of stomach and duodenum, unspecified: K31.9

## 2011-12-27 HISTORY — DX: Malingerer (conscious simulation): Z76.5

## 2011-12-27 HISTORY — DX: Unspecified convulsions: R56.9

## 2011-12-27 LAB — COMPREHENSIVE METABOLIC PANEL
ALT: 23 U/L (ref 0–35)
AST: 18 U/L (ref 0–37)
Alkaline Phosphatase: 225 U/L — ABNORMAL HIGH (ref 39–117)
CO2: 23 mEq/L (ref 19–32)
Calcium: 9.1 mg/dL (ref 8.4–10.5)
Chloride: 97 mEq/L (ref 96–112)
GFR calc non Af Amer: 7 mL/min — ABNORMAL LOW (ref >90)
Potassium: 4.2 mEq/L (ref 3.5–5.1)
Sodium: 135 mEq/L (ref 135–145)

## 2011-12-27 LAB — CBC WITH DIFFERENTIAL/PLATELET
Basophils Absolute: 0.1 10*3/uL (ref 0.0–0.1)
Eosinophils Relative: 3 % (ref 0–5)
Lymphocytes Relative: 15 % (ref 12–46)
Lymphs Abs: 1.3 10*3/uL (ref 0.7–4.0)
Neutro Abs: 6.6 10*3/uL (ref 1.7–7.7)
Neutrophils Relative %: 78 % — ABNORMAL HIGH (ref 43–77)
Platelets: 269 10*3/uL (ref 150–400)
RBC: 2.62 MIL/uL — ABNORMAL LOW (ref 3.87–5.11)
RDW: 15.6 % — ABNORMAL HIGH (ref 11.5–15.5)
WBC: 8.4 10*3/uL (ref 4.0–10.5)

## 2011-12-27 LAB — URINE MICROSCOPIC-ADD ON

## 2011-12-27 LAB — URINALYSIS, ROUTINE W REFLEX MICROSCOPIC
Bilirubin Urine: NEGATIVE
Glucose, UA: 500 mg/dL — AB
Protein, ur: >300 mg/dL — AB
Specific Gravity, Urine: 1.02 (ref 1.005–1.030)

## 2011-12-27 LAB — TROPONIN I: Troponin I: <0.3 ng/mL (ref <0.30)

## 2011-12-27 LAB — GLUCOSE, CAPILLARY: Glucose-Capillary: 93 mg/dL (ref 70–99)

## 2011-12-27 MED ORDER — INSULIN ASPART 100 UNIT/ML ~~LOC~~ SOLN
0.0000 [IU] | Freq: Every day | SUBCUTANEOUS | Status: DC
  Administered 2011-12-29 – 2011-12-30 (×2): 3 [IU] via SUBCUTANEOUS

## 2011-12-27 MED ORDER — GABAPENTIN 300 MG PO CAPS
300.0000 mg | ORAL_CAPSULE | Freq: Two times a day (BID) | ORAL | Status: DC
  Administered 2011-12-27 – 2011-12-31 (×8): 300 mg via ORAL
  Filled 2011-12-27 (×8): qty 1

## 2011-12-27 MED ORDER — INSULIN ASPART 100 UNIT/ML ~~LOC~~ SOLN
0.0000 [IU] | Freq: Three times a day (TID) | SUBCUTANEOUS | Status: DC
  Administered 2011-12-28 – 2011-12-29 (×2): 5 [IU] via SUBCUTANEOUS
  Administered 2011-12-29 – 2011-12-30 (×3): 2 [IU] via SUBCUTANEOUS
  Administered 2011-12-30: 1 [IU] via SUBCUTANEOUS
  Administered 2011-12-30: 3 [IU] via SUBCUTANEOUS
  Administered 2011-12-31: 1 [IU] via SUBCUTANEOUS
  Administered 2011-12-31: 2 [IU] via SUBCUTANEOUS
  Administered 2011-12-31: 5 [IU] via SUBCUTANEOUS

## 2011-12-27 MED ORDER — CLONIDINE HCL 0.1 MG/24HR TD PTWK
0.1000 mg | MEDICATED_PATCH | TRANSDERMAL | Status: DC
Start: 2011-12-27 — End: 2011-12-31
  Administered 2011-12-27: 0.1 mg via TRANSDERMAL
  Filled 2011-12-27: qty 1

## 2011-12-27 MED ORDER — VANCOMYCIN HCL IN DEXTROSE 1-5 GM/200ML-% IV SOLN
1000.0000 mg | Freq: Once | INTRAVENOUS | Status: AC
  Administered 2011-12-27: 1000 mg via INTRAVENOUS
  Filled 2011-12-27: qty 200

## 2011-12-27 MED ORDER — ONDANSETRON HCL 4 MG/2ML IJ SOLN
4.0000 mg | Freq: Once | INTRAMUSCULAR | Status: AC
  Administered 2011-12-27: 4 mg via INTRAVENOUS
  Filled 2011-12-27: qty 2

## 2011-12-27 MED ORDER — ALUM & MAG HYDROXIDE-SIMETH 200-200-20 MG/5ML PO SUSP: 30.0000 mL | Freq: Four times a day (QID) | ORAL | Status: DC | PRN

## 2011-12-27 MED ORDER — SODIUM CHLORIDE 0.9 % IV BOLUS (SEPSIS)
250.0000 mL | Freq: Once | INTRAVENOUS | Status: AC
  Administered 2011-12-27: 250 mL via INTRAVENOUS

## 2011-12-27 MED ORDER — TRAZODONE HCL 50 MG PO TABS
25.0000 mg | ORAL_TABLET | Freq: Every evening | ORAL | Status: DC | PRN
  Administered 2011-12-27: 25 mg via ORAL
  Filled 2011-12-27: qty 1

## 2011-12-27 MED ORDER — DOCUSATE SODIUM 100 MG PO CAPS
100.0000 mg | ORAL_CAPSULE | Freq: Two times a day (BID) | ORAL | Status: DC
  Administered 2011-12-27: 100 mg via ORAL
  Filled 2011-12-27: qty 1

## 2011-12-27 MED ORDER — HYDROMORPHONE HCL PF 1 MG/ML IJ SOLN
1.0000 mg | Freq: Once | INTRAMUSCULAR | Status: AC
  Administered 2011-12-27: 1 mg via INTRAVENOUS
  Filled 2011-12-27: qty 1

## 2011-12-27 MED ORDER — METOCLOPRAMIDE HCL 5 MG/ML IJ SOLN
5.0000 mg | Freq: Three times a day (TID) | INTRAMUSCULAR | Status: DC
  Administered 2011-12-27 – 2011-12-30 (×8): 5 mg via INTRAVENOUS
  Filled 2011-12-27 (×8): qty 2

## 2011-12-27 MED ORDER — INSULIN GLARGINE 100 UNIT/ML ~~LOC~~ SOLN
5.0000 [IU] | Freq: Every day | SUBCUTANEOUS | Status: DC
  Administered 2011-12-27: via SUBCUTANEOUS
  Administered 2011-12-28 – 2011-12-30 (×3): 5 [IU] via SUBCUTANEOUS

## 2011-12-27 MED ORDER — IOHEXOL 300 MG/ML  SOLN
100.0000 mL | Freq: Once | INTRAMUSCULAR | Status: AC | PRN
  Administered 2011-12-27: 100 mL via INTRAVENOUS

## 2011-12-27 MED ORDER — SODIUM CHLORIDE 0.9 % IV SOLN
INTRAVENOUS | Status: DC
  Administered 2011-12-27: 20:00:00 via INTRAVENOUS

## 2011-12-27 MED ORDER — SODIUM CHLORIDE 0.9 % IJ SOLN
3.0000 mL | Freq: Two times a day (BID) | INTRAMUSCULAR | Status: DC
  Administered 2011-12-27 – 2011-12-30 (×6): 3 mL via INTRAVENOUS
  Filled 2011-12-27: qty 3

## 2011-12-27 MED ORDER — SEVELAMER CARBONATE 800 MG PO TABS
800.0000 mg | ORAL_TABLET | Freq: Three times a day (TID) | ORAL | Status: DC
  Administered 2011-12-28 – 2011-12-31 (×11): 800 mg via ORAL
  Filled 2011-12-27 (×11): qty 1

## 2011-12-27 MED ORDER — POLYETHYLENE GLYCOL 3350 17 G PO PACK
17.0000 g | PACK | Freq: Every day | ORAL | Status: DC | PRN
  Administered 2011-12-30: 17 g via ORAL
  Filled 2011-12-27: qty 1

## 2011-12-27 MED ORDER — ASPIRIN EC 81 MG PO TBEC
81.0000 mg | DELAYED_RELEASE_TABLET | Freq: Every day | ORAL | Status: DC
  Administered 2011-12-28 – 2011-12-31 (×4): 81 mg via ORAL
  Filled 2011-12-27 (×4): qty 1

## 2011-12-27 MED ORDER — FLUOXETINE HCL 20 MG PO CAPS
20.0000 mg | ORAL_CAPSULE | Freq: Every day | ORAL | Status: DC
  Administered 2011-12-28 – 2011-12-31 (×4): 20 mg via ORAL
  Filled 2011-12-27 (×4): qty 1

## 2011-12-27 MED ORDER — LABETALOL HCL 200 MG PO TABS
300.0000 mg | ORAL_TABLET | Freq: Three times a day (TID) | ORAL | Status: DC
  Administered 2011-12-27 – 2011-12-31 (×11): 300 mg via ORAL
  Filled 2011-12-27: qty 2
  Filled 2011-12-27: qty 1
  Filled 2011-12-27 (×8): qty 2
  Filled 2011-12-27: qty 1
  Filled 2011-12-27: qty 2

## 2011-12-27 MED ORDER — NEPHRO-VITE 0.8 MG PO TABS
1.0000 | ORAL_TABLET | Freq: Every day | ORAL | Status: DC
  Administered 2011-12-28 – 2011-12-31 (×4): 1 via ORAL
  Filled 2011-12-27 (×7): qty 1

## 2011-12-27 MED ORDER — ACETAMINOPHEN 325 MG PO TABS: 650.0000 mg | ORAL_TABLET | Freq: Four times a day (QID) | ORAL | Status: DC | PRN

## 2011-12-27 MED ORDER — AMLODIPINE BESYLATE 5 MG PO TABS
5.0000 mg | ORAL_TABLET | Freq: Every day | ORAL | Status: DC
  Administered 2011-12-28 – 2011-12-31 (×4): 5 mg via ORAL
  Filled 2011-12-27 (×4): qty 1

## 2011-12-27 MED ORDER — FENTANYL CITRATE 0.05 MG/ML IJ SOLN
25.0000 ug | INTRAMUSCULAR | Status: DC | PRN
  Administered 2011-12-27 – 2011-12-31 (×22): 25 ug via INTRAVENOUS
  Filled 2011-12-27 (×22): qty 2

## 2011-12-27 MED ORDER — ACETAMINOPHEN 650 MG RE SUPP: 650.0000 mg | Freq: Four times a day (QID) | RECTAL | Status: DC | PRN

## 2011-12-27 NOTE — ED Provider Notes (Addendum)
History   This chart was scribed for Shelda Jakes, MD by Gerlean Ren, ED Scribe. This patient was seen in room [facility] and the patient's care was started at 4:58 PM    CSN: [id number]  Arrival date & time 12/27/11  1637   First MD Initiated Contact with Patient 12/27/11 1651      Chief Complaint  Patient presents with  . Back Pain  . Abdominal Pain     The history is provided by the patient. No language interpreter was used.  [name] is a 26 y.o. female [facility] dialysis pt brought in by ambulance who presents to the Emergency Department complaining of gradual onset, gradually worsening abdominal pain that radiates to bilateral flanks beginning after dialysis yesterday.  Pt also reports nausea, cough, feeling feverish and night sweats last night.  Pt denies emesis, HA, neck pain, rash, swelling in legs.  Pt states she urinates 1-2 times daily.  Pt has h/o DM, HTN, anemia, DVT.  Pt denies tobacco and alcohol use.  PCP is Dr. Bradly Bienenstock. Past Medical History  Diagnosis Date  . 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 Hypertension   . Hyperlipidemia   . Nephrotic syndrome   . Anemia     Chronic,  Dr. Mariel Sleet,  Transfused, May, 2012  . DVT (deep venous thrombosis)     Bilateral . recurrent  coumadin  . Hypertrophic cardiomyopathy     Diastolic dysfunction  . Legally blind     Bilateral hemorrhagic retinal detachments  . Vaginosis   . History of MRSA infection     Multiple  . Glaucoma(365)   . Warfarin anticoagulation     DVT  . Dialysis patient     M-W-F  . Acute pyelonephritis   . Medical non-compliance   . Manic depressive disorder   . 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 Anemia of renal disease   . Seizures   . Anemia     Past Surgical History  Procedure Date  . Left arm cyst removal   . Incise and drain abcess 04/24/11    Multiple MRSA - March 2013 drain port of abcess  . Placement of  port a cath     Poor venous access  . Av fistula placement   . Hematoma evacuation 05/25/2011    Procedure: EVACUATION HEMATOMA;  Surgeon: Sherren Kerns, MD;  Location: [facility] OR;  Service: Vascular;  Laterality: Right;  Evacuation Hematoma Right Arm  . Port-a-cath removal 07/04/2011    Procedure: MINOR REMOVAL PORT-A-CATH;  Surgeon: Dalia Heading, MD;  Location: [facility] ORS;  Service: General;  Laterality: Left;  Removal Port-A-Cath/Removal Dialysis Catheter in Minor Room  . Tee without cardioversion 10/29/2011    Procedure: TRANSESOPHAGEAL ECHOCARDIOGRAM (TEE);  Surgeon: Kathlen Brunswick, MD;  Location: [facility] ENDO SUITE;  Service: Cardiovascular;  Laterality: N/A;    Family History  Problem Relation Age of Onset  . Diabetes Mother   . Hypertension Mother   . Diabetes Father     History  Substance Use Topics  . Smoking status: Never Smoker   . Smokeless tobacco: Not on file  . Alcohol Use: No    No OB history provided.   Review of Systems  Constitutional: Negative for fever.  HENT: Negative for sore throat and neck pain.   Eyes:       Pt is legally blind.  Respiratory: Positive for cough.  Negative for shortness of breath.   Cardiovascular: Negative for chest pain and leg swelling.  Gastrointestinal: Positive for nausea and abdominal pain. Negative for vomiting.  Genitourinary: Positive for flank pain.  Skin: Negative for rash.  Neurological: Negative for headaches.  Psychiatric/Behavioral: Negative for confusion.    Allergies  Ancef; Coumadin; Morphine and related; Sulfa antibiotics; and Tomato  Home Medications   Current Outpatient Rx  Name  Route  Sig  Dispense  Refill  . AMLODIPINE BESYLATE 5 MG PO TABS   Oral   Take 1 tablet (5 mg total) by mouth daily. For treatment of high blood pressure.   30 tablet   3   . CLONIDINE HCL 0.1 MG/24HR TD PTWK   Transdermal   Place 1 patch (0.1 mg total) onto the skin once a week. For treatment of high blood pressure.   4 patch    3   . FLUOXETINE HCL 20 MG PO CAPS   Oral   Take 20 mg by mouth daily.         Marland Kitchen GABAPENTIN 300 MG PO CAPS   Oral   Take 1 capsule (300 mg total) by mouth 2 (two) times daily. For treatment of nerve pain in the legs.   60 capsule   3   . INSULIN GLARGINE 100 UNIT/ML [place] SOLN   Subcutaneous   Inject 25 Units into the skin at bedtime.         . INSULIN LISPRO (HUMAN) 100 UNIT/ML [place] SOLN   Subcutaneous   Inject 5-20 Units into the skin 3 (three) times daily before meals. Per sliding scale. Pt takes anywhere from 5 to 20 units.         Marland Kitchen LABETALOL HCL 300 MG PO TABS   Oral   Take 1 tablet (300 mg total) by mouth 3 (three) times daily. For treatment of high blood pressure. The dose was increased to 1 tablet 3 times daily.   90 tablet   3   . RENA-VITE PO TABS   Oral   Take 1 tablet by mouth daily.         Marland Kitchen SEVELAMER CARBONATE 800 MG PO TABS   Oral   Take 800 mg by mouth 3 (three) times daily with meals.         Marland Kitchen VANCOMYCIN 1000 MG IVPB (HEMODIALYSIS)   Intravenous   Inject 1,500 mg into the vein every Tuesday, Thursday, and Saturday at 6 PM. Continue through the end of 11/29/2011 for treatment of endocarditis. Dialysis given at 6am           BP 166/107  Pulse 112  Temp 99.9 F (37.7 C) (Oral)  Resp 18  Ht 5\' 8"  (1.727 m)  Wt 132 lb 4.4 oz (60 kg)  BMI 20.11 kg/m2  SpO2 98%  LMP 12/13/2011  Physical Exam  Nursing note and vitals reviewed. Constitutional: She is oriented to person, place, and time. She appears well-developed and well-nourished.       Pt feels warm and sweaty  HENT:  Head: Normocephalic and atraumatic.  Mouth/Throat: Oropharynx is clear and moist.  Eyes:       Glaucoma in right eye.  Neck: Normal range of motion. No tracheal deviation present.  Cardiovascular: Regular rhythm.   No murmur heard.      Tachycardic.  Pulmonary/Chest: Effort normal and breath sounds normal. She has no wheezes.  Abdominal: Soft. Bowel sounds are normal.  There is tenderness.       Diffusely  tender.  Genitourinary:       Bilateral CVA tenderness.  Musculoskeletal: Normal range of motion. She exhibits no edema.  Neurological: She is alert and oriented to person, place, and time. No cranial nerve deficit. Coordination normal.  Skin: Skin is warm.  Psychiatric: She has a normal mood and affect.    ED Course  Procedures (including critical care time) DIAGNOSTIC STUDIES: Oxygen Saturation is 98% on room air, normal by my interpretation.    COORDINATION OF CARE: 5:06 PM- Patient informed of clinical course, understands medical decision-making process, and agrees with plan.  Ordered IV fluids, IV Zofran, IV dilaudid, IV Vancocin, CBC, c-met, lipase, urinalysis, urine culture, blood culture, lactic acid, chest XR, and abdominal pelvic CT with contrast.  Labs Reviewed  CBC WITH DIFFERENTIAL - Abnormal; Notable for the following:    RBC 2.62 (*)     Hemoglobin 8.0 (*)     HCT 24.2 (*)     RDW 15.6 (*)     Neutrophils Relative 78 (*)     All other components within normal limits  COMPREHENSIVE METABOLIC PANEL - Abnormal; Notable for the following:    Glucose, Bld 129 (*)     BUN 37 (*)     Creatinine, Ser 7.53 (*)     Albumin 2.7 (*)     Alkaline Phosphatase 225 (*)     GFR calc non Af Amer 7 (*)     GFR calc Af Amer 8 (*)     All other components within normal limits  URINALYSIS, ROUTINE W REFLEX MICROSCOPIC - Abnormal; Notable for the following:    APPearance HAZY (*)     Glucose, UA 500 (*)     Hgb urine dipstick SMALL (*)     Protein, ur >300 (*)     Leukocytes, UA SMALL (*)     All other components within normal limits  URINE MICROSCOPIC-ADD ON - Abnormal; Notable for the following:    Squamous Epithelial / LPF MANY (*)     Bacteria, UA FEW (*)     All other components within normal limits  LIPASE, BLOOD  LACTIC ACID, PLASMA  URINE CULTURE  CULTURE, BLOOD (ROUTINE X 2)  CULTURE, BLOOD (ROUTINE X 2)   Results for orders  placed during the hospital encounter of 12/27/11  CBC WITH DIFFERENTIAL      Component Value Range   WBC 8.4  4.0 - 10.5 K/uL   RBC 2.62 (*) 3.87 - 5.11 MIL/uL   Hemoglobin 8.0 (*) 12.0 - 15.0 g/dL   HCT 16.1 (*) 09.6 - 04.5 %   MCV 92.4  78.0 - 100.0 fL   MCH 30.5  26.0 - 34.0 pg   MCHC 33.1  30.0 - 36.0 g/dL   RDW 40.9 (*) 81.1 - 91.4 %   Platelets 269  150 - 400 K/uL   Neutrophils Relative 78 (*) 43 - 77 %   Neutro Abs 6.6  1.7 - 7.7 K/uL   Lymphocytes Relative 15  12 - 46 %   Lymphs Abs 1.3  0.7 - 4.0 K/uL   Monocytes Relative 4  3 - 12 %   Monocytes Absolute 0.3  0.1 - 1.0 K/uL   Eosinophils Relative 3  0 - 5 %   Eosinophils Absolute 0.3  0.0 - 0.7 K/uL   Basophils Relative 1  0 - 1 %   Basophils Absolute 0.1  0.0 - 0.1 K/uL  COMPREHENSIVE METABOLIC PANEL      Component Value  Range   Sodium 135  135 - 145 mEq/L   Potassium 4.2  3.5 - 5.1 mEq/L   Chloride 97  96 - 112 mEq/L   CO2 23  19 - 32 mEq/L   Glucose, Bld 129 (*) 70 - 99 mg/dL   BUN 37 (*) 6 - 23 mg/dL   Creatinine, Ser 2.95 (*) 0.50 - 1.10 mg/dL   Calcium 9.1  8.4 - 62.1 mg/dL   Total Protein 7.6  6.0 - 8.3 g/dL   Albumin 2.7 (*) 3.5 - 5.2 g/dL   AST 18  0 - 37 U/L   ALT 23  0 - 35 U/L   Alkaline Phosphatase 225 (*) 39 - 117 U/L   Total Bilirubin 0.4  0.3 - 1.2 mg/dL   GFR calc non Af Amer 7 (*) >90 mL/min   GFR calc Af Amer 8 (*) >90 mL/min  LIPASE, BLOOD      Component Value Range   Lipase 13  11 - 59 U/L  URINALYSIS, ROUTINE W REFLEX MICROSCOPIC      Component Value Range   Color, Urine YELLOW  YELLOW   APPearance HAZY (*) CLEAR   Specific Gravity, Urine 1.020  1.005 - 1.030   pH 8.0  5.0 - 8.0   Glucose, UA 500 (*) NEGATIVE mg/dL   Hgb urine dipstick SMALL (*) NEGATIVE   Bilirubin Urine NEGATIVE  NEGATIVE   Ketones, ur NEGATIVE  NEGATIVE mg/dL   Protein, ur >308 (*) NEGATIVE mg/dL   Urobilinogen, UA 0.2  0.0 - 1.0 mg/dL   Nitrite NEGATIVE  NEGATIVE   Leukocytes, UA SMALL (*) NEGATIVE  LACTIC  ACID, PLASMA      Component Value Range   Lactic Acid, Venous 0.6  0.5 - 2.2 mmol/L  URINE MICROSCOPIC-ADD ON      Component Value Range   Squamous Epithelial / LPF MANY (*) RARE   WBC, UA 21-50  <3 WBC/hpf   RBC / HPF 3-6  <3 RBC/hpf   Bacteria, UA FEW (*) RARE    Ct Abdomen Pelvis W Contrast  12/27/2011  *RADIOLOGY REPORT*  Clinical Data: Abdominal pain.  History of dialysis dependent end- stage renal disease.  CT ABDOMEN AND PELVIS WITH CONTRAST  Technique:  Multidetector CT imaging of the abdomen and pelvis was performed following the standard protocol during bolus administration of intravenous contrast.  Contrast: OMNIPAQUE IOHEXOL 300 MG/ML  SOLN  Comparison: Unenhanced CT dated 10/21/2011.  Findings: The liver, spleen, gallbladder, pancreas, adrenal glands and bowel are unremarkable.  The kidneys show no evidence of obstruction.  The bladder is decompressed.  No free fluid or focal abscess identified.  There is some edema in the subcutaneous tissues.  No evidence of masses, enlarged lymph nodes or bony lesions.  No hernias are identified.  IMPRESSION: No acute abnormalities.   Original Report Authenticated By: Irish Lack, M.D.    Dg Chest Port 1 View  12/27/2011  *RADIOLOGY REPORT*  Clinical Data: Back and abdominal pain.  PORTABLE CHEST - 1 VIEW  Comparison: 12/19/2011  Findings: Right IJ hemodialysis catheter stable in position. Stable mild cardiomegaly.  Lungs clear.  No effusion.  IMPRESSION:  1.  Stable mild cardiomegaly.   Original Report Authenticated By: D. Andria Rhein, MD     Date: 12/27/2011  Rate: 97  Rhythm: normal sinus rhythm  QRS Axis: right  Intervals: normal  ST/T Wave abnormalities: normal  Conduction Disutrbances:none  Narrative Interpretation:   Old EKG Reviewed: unchanged No sig changes  from 10/21/11   1. Fever   2. Abdominal pain     CRITICAL CARE Performed by: Shelda Jakes.   Total critical care time: 30  Critical care time was  exclusive of separately billable procedures and treating other patients.  Critical care was necessary to treat or prevent imminent or life-threatening deterioration.  Critical care was time spent personally by me on the following activities: development of treatment plan with patient and/or surrogate as well as nursing, discussions with consultants, evaluation of patient's response to treatment, examination of patient, obtaining history from patient or surrogate, ordering and performing treatments and interventions, ordering and review of laboratory studies, ordering and review of radiographic studies, pulse oximetry and re-evaluation of patient's condition.   MDM  Patient with subjective fevers and chills. An abdominal pain. The symptoms are similar to what led to her admission in September at that time she developed positive blood cultures and bacteremia had positive urine and also heart valve involvement. Consistent with endocarditis. Today's workup without any cystic findings. Other than she's remains some baseline tachycardia. CT of the abdomen without any specific findings. Patient normally dialyzed on Tuesdays Thursdays and Saturdays. She has not missed dialysis. Patient was given vancomycin after blood cultures and urine culture. Lactic acid is not elevated.  Discussed with the admitting hospitalist team they will admit there okay with the vancomycin for now. They will continue blood cultures. Concern would be for bacteremia and/or endocarditis recurrence. Patient given judicial fluids for the tachycardia without much change.      I personally performed the services described in this documentation, which was scribed in my presence. The recorded information has been reviewed and is accurate.          Shelda Jakes, MD 12/27/11 2029  Shelda Jakes, MD 12/28/11 [phone]

## 2011-12-27 NOTE — ED Notes (Addendum)
Pt denies emesis and diarrhea but does co nausea. Legally blind.

## 2011-12-27 NOTE — ED Notes (Addendum)
Pt is a dialysis pt, started hurting in abdomen and back after dialysis yesterday, pain intensified today, also co fever - 99.9 on triage

## 2011-12-28 ENCOUNTER — Encounter (HOSPITAL_COMMUNITY): Payer: Self-pay | Admitting: Internal Medicine

## 2011-12-28 DIAGNOSIS — E1049 Type 1 diabetes mellitus with other diabetic neurological complication: Secondary | ICD-10-CM

## 2011-12-28 DIAGNOSIS — R531 Weakness: Secondary | ICD-10-CM | POA: Diagnosis present

## 2011-12-28 DIAGNOSIS — K3184 Gastroparesis: Secondary | ICD-10-CM

## 2011-12-28 DIAGNOSIS — K3189 Other diseases of stomach and duodenum: Secondary | ICD-10-CM

## 2011-12-28 DIAGNOSIS — E1043 Type 1 diabetes mellitus with diabetic autonomic (poly)neuropathy: Secondary | ICD-10-CM

## 2011-12-28 DIAGNOSIS — Z765 Malingerer [conscious simulation]: Secondary | ICD-10-CM

## 2011-12-28 DIAGNOSIS — R1084 Generalized abdominal pain: Secondary | ICD-10-CM | POA: Diagnosis present

## 2011-12-28 HISTORY — DX: Type 1 diabetes mellitus with diabetic autonomic (poly)neuropathy: E10.43

## 2011-12-28 HISTORY — DX: Other diseases of stomach and duodenum: K31.89

## 2011-12-28 HISTORY — DX: Malingerer (conscious simulation): Z76.5

## 2011-12-28 LAB — CBC
HCT: 22.9 % — ABNORMAL LOW (ref 36.0–46.0)
MCHC: 32.3 g/dL (ref 30.0–36.0)
Platelets: 209 10*3/uL (ref 150–400)
RDW: 15.8 % — ABNORMAL HIGH (ref 11.5–15.5)
WBC: 6 10*3/uL (ref 4.0–10.5)

## 2011-12-28 LAB — GLUCOSE, CAPILLARY: Glucose-Capillary: 251 mg/dL — ABNORMAL HIGH (ref 70–99)

## 2011-12-28 LAB — COMPREHENSIVE METABOLIC PANEL
ALT: 20 U/L (ref 0–35)
Alkaline Phosphatase: 218 U/L — ABNORMAL HIGH (ref 39–117)
BUN: 40 mg/dL — ABNORMAL HIGH (ref 6–23)
CO2: 22 mEq/L (ref 19–32)
Chloride: 95 mEq/L — ABNORMAL LOW (ref 96–112)
GFR calc Af Amer: 7 mL/min — ABNORMAL LOW (ref >90)
GFR calc non Af Amer: 6 mL/min — ABNORMAL LOW (ref >90)
Glucose, Bld: 78 mg/dL (ref 70–99)
Potassium: 4.6 mEq/L (ref 3.5–5.1)
Sodium: 132 mEq/L — ABNORMAL LOW (ref 135–145)
Total Bilirubin: 0.5 mg/dL (ref 0.3–1.2)

## 2011-12-28 LAB — HEMOGLOBIN A1C
Hgb A1c MFr Bld: 9.9 % — ABNORMAL HIGH (ref <5.7)
Mean Plasma Glucose: 237 mg/dL — ABNORMAL HIGH (ref <117)

## 2011-12-28 LAB — PREPARE RBC (CROSSMATCH)

## 2011-12-28 MED ORDER — EPOETIN ALFA 10000 UNIT/ML IJ SOLN
10000.0000 [IU] | INTRAMUSCULAR | Status: DC
  Administered 2011-12-28 – 2011-12-31 (×2): 10000 [IU] via INTRAVENOUS
  Filled 2011-12-28 (×3): qty 1

## 2011-12-28 MED ORDER — SENNOSIDES-DOCUSATE SODIUM 8.6-50 MG PO TABS
1.0000 | ORAL_TABLET | Freq: Two times a day (BID) | ORAL | Status: DC
  Administered 2011-12-29 – 2011-12-31 (×4): 1 via ORAL
  Filled 2011-12-28 (×6): qty 1

## 2011-12-28 MED ORDER — VANCOMYCIN HCL 1000 MG IV SOLR
1250.0000 mg | INTRAVENOUS | Status: DC
  Administered 2011-12-28 – 2011-12-31 (×2): 1250 mg via INTRAVENOUS
  Filled 2011-12-28 (×2): qty 1250

## 2011-12-28 MED ORDER — FAMOTIDINE 20 MG PO TABS
20.0000 mg | ORAL_TABLET | Freq: Two times a day (BID) | ORAL | Status: DC
  Administered 2011-12-28 – 2011-12-31 (×7): 20 mg via ORAL
  Filled 2011-12-28 (×7): qty 1

## 2011-12-28 NOTE — Progress Notes (Signed)
CRITICAL VALUE ALERT  Critical value received: BC- Gram positive cocci in clusters  Date of notification:  12/28/11  Time of notification:  1515  Critical value read back:yes  Nurse who received alert:  j Azuri Bozard rn  MD notified (1st page):  fisher  Time of first page:  1520 MD notified (2nd page):  Time of second page:  Responding MD:  fisher  Time MD responded: [phone]

## 2011-12-28 NOTE — Consult Note (Signed)
Reason for Consult: End-stage renal disease  Referring :  Dr. Vito Backers is an 26 y.o. female.  HPI: she is a patient was history of diabetes, diabetic gastropathy hypertension presently came with complaints of nausea abdominal pain and vomiting of her 2 days duration. According to patient she started having abdominal pain on Thursday. Since then the pain becomes intolerable and unable to eat hence decided to come to the emergency room. Patient also has history of cough but nonproductive. She denies any fever she has some chills. Patient has this moment says that she's feeling better.   Past Medical History  Diagnosis Date  . 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 Hypertension   . Hyperlipidemia   . Nephrotic syndrome   . Anemia     Chronic,  Dr. Mariel Sleet,  Transfused, May, 2012  . DVT (deep venous thrombosis)     Bilateral . recurrent  coumadin  . Hypertrophic cardiomyopathy     Diastolic dysfunction  . Legally blind     Bilateral hemorrhagic retinal detachments  . Vaginosis   . History of MRSA infection     Multiple  . Glaucoma(365)   . Warfarin anticoagulation     DVT  . Dialysis patient     M-W-F  . Acute pyelonephritis   . Medical non-compliance   . Manic depressive disorder   . 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 Anemia of renal disease   . Seizures   . Anemia     Past Surgical History  Procedure Date  . Left arm cyst removal   . Incise and drain abcess 04/24/11    Multiple MRSA - March 2013 drain port of abcess  . Placement of port a cath     Poor venous access  . Av fistula placement   . Hematoma evacuation 05/25/2011    Procedure: EVACUATION HEMATOMA;  Surgeon: Sherren Kerns, MD;  Location: [facility] OR;  Service: Vascular;  Laterality: Right;  Evacuation Hematoma Right Arm  . Port-a-cath removal 07/04/2011    Procedure: MINOR REMOVAL PORT-A-CATH;  Surgeon: Dalia Heading, MD;   Location: [facility] ORS;  Service: General;  Laterality: Left;  Removal Port-A-Cath/Removal Dialysis Catheter in Minor Room  . Tee without cardioversion 10/29/2011    Procedure: TRANSESOPHAGEAL ECHOCARDIOGRAM (TEE);  Surgeon: Kathlen Brunswick, MD;  Location: [facility] SUITE;  Service: Cardiovascular;  Laterality: N/A;    Family History  Problem Relation Age of Onset  . Diabetes Mother   . Hypertension Mother   . Diabetes Father     Social History:  reports that she has never smoked. She does not have any smokeless tobacco history on file. She reports that she does not drink alcohol or use illicit drugs.  Allergies:  Allergies  Allergen Reactions  . Ancef (Cefazolin Sodium) Shortness Of Breath    wheezing  . Coumadin (Warfarin Sodium)     Eyes bleed  . Morphine And Related Itching  . Sulfa Antibiotics Anaphylaxis and Shortness Of Breath  . Tomato Hives and Swelling    Medications: I have reviewed the patient's current medications.  Results for orders placed during the hospital encounter of 12/27/11 (from the past 48 hour(s))  CBC WITH DIFFERENTIAL     Status: Abnormal   Collection Time   12/27/11  5:25 PM      Component Value Range Comment   WBC 8.4  4.0 -  10.5 K/uL    RBC 2.62 (*) 3.87 - 5.11 MIL/uL    Hemoglobin 8.0 (*) 12.0 - 15.0 g/dL    HCT 09.8 (*) 11.9 - 46.0 %    MCV 92.4  78.0 - 100.0 fL    MCH 30.5  26.0 - 34.0 pg    MCHC 33.1  30.0 - 36.0 g/dL    RDW 14.7 (*) 82.9 - 15.5 %    Platelets 269  150 - 400 K/uL    Neutrophils Relative 78 (*) 43 - 77 %    Neutro Abs 6.6  1.7 - 7.7 K/uL    Lymphocytes Relative 15  12 - 46 %    Lymphs Abs 1.3  0.7 - 4.0 K/uL    Monocytes Relative 4  3 - 12 %    Monocytes Absolute 0.3  0.1 - 1.0 K/uL    Eosinophils Relative 3  0 - 5 %    Eosinophils Absolute 0.3  0.0 - 0.7 K/uL    Basophils Relative 1  0 - 1 %    Basophils Absolute 0.1  0.0 - 0.1 K/uL   COMPREHENSIVE METABOLIC PANEL     Status: Abnormal   Collection Time   12/27/11  5:25  PM      Component Value Range Comment   Sodium 135  135 - 145 mEq/L    Potassium 4.2  3.5 - 5.1 mEq/L    Chloride 97  96 - 112 mEq/L    CO2 23  19 - 32 mEq/L    Glucose, Bld 129 (*) 70 - 99 mg/dL    BUN 37 (*) 6 - 23 mg/dL    Creatinine, Ser 5.62 (*) 0.50 - 1.10 mg/dL    Calcium 9.1  8.4 - 13.0 mg/dL    Total Protein 7.6  6.0 - 8.3 g/dL    Albumin 2.7 (*) 3.5 - 5.2 g/dL    AST 18  0 - 37 U/L    ALT 23  0 - 35 U/L    Alkaline Phosphatase 225 (*) 39 - 117 U/L    Total Bilirubin 0.4  0.3 - 1.2 mg/dL    GFR calc non Af Amer 7 (*) >90 mL/min    GFR calc Af Amer 8 (*) >90 mL/min   LIPASE, BLOOD     Status: Normal   Collection Time   12/27/11  5:25 PM      Component Value Range Comment   Lipase 13  11 - 59 U/L   LACTIC ACID, PLASMA     Status: Normal   Collection Time   12/27/11  5:25 PM      Component Value Range Comment   Lactic Acid, Venous 0.6  0.5 - 2.2 mmol/L   TROPONIN I     Status: Normal   Collection Time   12/27/11  5:25 PM      Component Value Range Comment   Troponin I <0.30  <0.30 ng/mL   URINALYSIS, ROUTINE W REFLEX MICROSCOPIC     Status: Abnormal   Collection Time   12/27/11  5:39 PM      Component Value Range Comment   Color, Urine YELLOW  YELLOW    APPearance HAZY (*) CLEAR    Specific Gravity, Urine 1.020  1.005 - 1.030    pH 8.0  5.0 - 8.0    Glucose, UA 500 (*) NEGATIVE mg/dL    Hgb urine dipstick SMALL (*) NEGATIVE    Bilirubin Urine NEGATIVE  NEGATIVE  Ketones, ur NEGATIVE  NEGATIVE mg/dL    Protein, ur >161 (*) NEGATIVE mg/dL    Urobilinogen, UA 0.2  0.0 - 1.0 mg/dL    Nitrite NEGATIVE  NEGATIVE    Leukocytes, UA SMALL (*) NEGATIVE   URINE MICROSCOPIC-ADD ON     Status: Abnormal   Collection Time   12/27/11  5:39 PM      Component Value Range Comment   Squamous Epithelial / LPF MANY (*) RARE    WBC, UA 21-50  <3 WBC/hpf    RBC / HPF 3-6  <3 RBC/hpf    Bacteria, UA FEW (*) RARE   GLUCOSE, CAPILLARY     Status: Normal   Collection Time    12/27/11 11:23 PM      Component Value Range Comment   Glucose-Capillary 93  70 - 99 mg/dL   MAGNESIUM     Status: Normal   Collection Time   12/28/11  5:00 AM      Component Value Range Comment   Magnesium 2.0  1.5 - 2.5 mg/dL   PHOSPHORUS     Status: Abnormal   Collection Time   12/28/11  5:00 AM      Component Value Range Comment   Phosphorus 9.0 (*) 2.3 - 4.6 mg/dL   TROPONIN I     Status: Normal   Collection Time   12/28/11  5:00 AM      Component Value Range Comment   Troponin I <0.30  <0.30 ng/mL   COMPREHENSIVE METABOLIC PANEL     Status: Abnormal   Collection Time   12/28/11  5:00 AM      Component Value Range Comment   Sodium 132 (*) 135 - 145 mEq/L    Potassium 4.6  3.5 - 5.1 mEq/L    Chloride 95 (*) 96 - 112 mEq/L    CO2 22  19 - 32 mEq/L    Glucose, Bld 78  70 - 99 mg/dL    BUN 40 (*) 6 - 23 mg/dL    Creatinine, Ser 0.96 (*) 0.50 - 1.10 mg/dL    Calcium 8.7  8.4 - 04.5 mg/dL    Total Protein 7.3  6.0 - 8.3 g/dL    Albumin 2.4 (*) 3.5 - 5.2 g/dL    AST 17  0 - 37 U/L    ALT 20  0 - 35 U/L    Alkaline Phosphatase 218 (*) 39 - 117 U/L    Total Bilirubin 0.5  0.3 - 1.2 mg/dL    GFR calc non Af Amer 6 (*) >90 mL/min    GFR calc Af Amer 7 (*) >90 mL/min   CBC     Status: Abnormal   Collection Time   12/28/11  5:00 AM      Component Value Range Comment   WBC 6.0  4.0 - 10.5 K/uL    RBC 2.47 (*) 3.87 - 5.11 MIL/uL    Hemoglobin 7.4 (*) 12.0 - 15.0 g/dL    HCT 40.9 (*) 81.1 - 46.0 %    MCV 92.7  78.0 - 100.0 fL    MCH 30.0  26.0 - 34.0 pg    MCHC 32.3  30.0 - 36.0 g/dL    RDW 91.4 (*) 78.2 - 15.5 %    Platelets 209  150 - 400 K/uL   GLUCOSE, CAPILLARY     Status: Normal   Collection Time   12/28/11  7:34 AM      Component Value Range Comment  Glucose-Capillary 70  70 - 99 mg/dL    Comment 1 Documented in Chart      Comment 2 Notify RN       Ct Abdomen Pelvis W Contrast  12/27/2011  *RADIOLOGY REPORT*  Clinical Data: Abdominal pain.  History of  dialysis dependent end- stage renal disease.  CT ABDOMEN AND PELVIS WITH CONTRAST  Technique:  Multidetector CT imaging of the abdomen and pelvis was performed following the standard protocol during bolus administration of intravenous contrast.  Contrast: OMNIPAQUE IOHEXOL 300 MG/ML  SOLN  Comparison: Unenhanced CT dated 10/21/2011.  Findings: The liver, spleen, gallbladder, pancreas, adrenal glands and bowel are unremarkable.  The kidneys show no evidence of obstruction.  The bladder is decompressed.  No free fluid or focal abscess identified.  There is some edema in the subcutaneous tissues.  No evidence of masses, enlarged lymph nodes or bony lesions.  No hernias are identified.  IMPRESSION: No acute abnormalities.   Original Report Authenticated By: Irish Lack, M.D.    Dg Chest Port 1 View  12/27/2011  *RADIOLOGY REPORT*  Clinical Data: Back and abdominal pain.  PORTABLE CHEST - 1 VIEW  Comparison: 12/19/2011  Findings: Right IJ hemodialysis catheter stable in position. Stable mild cardiomegaly.  Lungs clear.  No effusion.  IMPRESSION:  1.  Stable mild cardiomegaly.   Original Report Authenticated By: D. Andria Rhein, MD     Review of Systems  HENT: Positive for congestion.   Respiratory: Positive for cough.   Gastrointestinal: Positive for nausea, vomiting and abdominal pain.  Musculoskeletal: Positive for back pain.   Blood pressure 156/92, pulse 101, temperature 99.5 F (37.5 C), temperature source Oral, resp. rate 18, height 5\' 8"  (1.727 m), weight 74.4 kg (164 lb 0.4 oz), last menstrual period 12/13/2011, SpO2 98.00%. Physical Exam  Constitutional: No distress.  Eyes: No scleral icterus.  Neck: Neck supple. No JVD present.  Cardiovascular: Normal rate and normal heart sounds.  Exam reveals no friction rub.   No murmur heard. Respiratory: No respiratory distress. She has no wheezes.  GI: She exhibits no distension. There is no tenderness. There is no rebound and no guarding.    Musculoskeletal: She exhibits no edema.    Assessment/Plan: Problem #1 nausea and vomiting. Patient had been admitted to the hospital because of similar issue multiple times. The etiology was thought to be secondary to diabetic gastropathy. Presently she seems to be feeling better. Problem #2 end-stage renal disease. Her BUN and creatinine is was in acceptable range normal potassium. Patient is due for dialysis today. Problem #3 anemia this secondary to chronic renal failure she is on Epogen Problem #4 history of diabetes Problem #5 history of hypertension blood pressure as this moment seems to be reasonably controlled Problem #6 history of diabetic retinopathy patient is blind. Problem #7 history of chronic back pain and pain medication seeking behavior. Patient always complains about back even when she was given oral pain medications. Plan: We'll make arrangements for patient to get dialysis today We'll give her Epogen 10,000 units IV after each dialysis We'll follow her CBC basic metabolic panel and phosphorus in the morning We'll DC IV fluid as patient seems to be tolerating feeding this morning.  Fynley Chrystal S 12/28/2011, 8:01 AM

## 2011-12-28 NOTE — Progress Notes (Signed)
Subjective: The patient is sitting up in bed. She ate some breakfast. She had some lower abdominal pain after eating and some nausea, but no vomiting. Overall, she says her pain is less than it was when she came in last night.  Objective: Vital signs in last 24 hours: Filed Vitals:   12/27/11 2200 12/27/11 2214 12/27/11 2301 12/28/11 0628  BP: 141/87 141/87 155/89 156/92  Pulse: 95 100 105 101  Temp:   98.7 F (37.1 C) 99.5 F (37.5 C)  TempSrc:   Oral Oral  Resp: 14 20 18 18   Height:   5\' 8"  (1.727 m)   Weight:   74.481 kg (164 lb 3.2 oz) 74.4 kg (164 lb 0.4 oz)  SpO2: 96% 97% 99% 98%    Intake/Output Summary (Last 24 hours) at 12/28/11 1300 Last data filed at 12/28/11 0800  Gross per 24 hour  Intake 645.33 ml  Output    201 ml  Net 444.33 ml    Weight change:   Physical exam: General: Alert 26 year old African-American woman sitting up in bed, in no acute distress. Lungs: Clear to auscultation bilaterally. Heart: S1, S2, with a soft systolic murmur. Abdomen: Positive bowel sounds, soft, mildly tender in the hypogastrium, without distention guarding or masses palpated. Extremities: AV graft left upper extremity with thrill, no surrounding erythema. Skin: Valve is catheter located at the right upper chest. No surrounding erythema or drainage.  Lab Results: Basic Metabolic Panel:  Basename 12/28/11 0500 12/27/11 1725  NA 132* 135  K 4.6 4.2  CL 95* 97  CO2 22 23  GLUCOSE 78 129*  BUN 40* 37*  CREATININE 8.00* 7.53*  CALCIUM 8.7 9.1  MG 2.0 --  PHOS 9.0* --   Liver Function Tests:  Basename 12/28/11 0500 12/27/11 1725  AST 17 18  ALT 20 23  ALKPHOS 218* 225*  BILITOT 0.5 0.4  PROT 7.3 7.6  ALBUMIN 2.4* 2.7*    Basename 12/27/11 1725  LIPASE 13  AMYLASE --   No results found for this basename: AMMONIA:2 in the last 72 hours CBC:  Basename 12/28/11 0500 12/27/11 1725  WBC 6.0 8.4  NEUTROABS -- 6.6  HGB 7.4* 8.0*  HCT 22.9* 24.2*  MCV 92.7 92.4    PLT 209 269   Cardiac Enzymes:  Basename 12/28/11 1154 12/28/11 0500 12/27/11 1725  CKTOTAL -- -- --  CKMB -- -- --  CKMBINDEX -- -- --  TROPONINI <0.30 <0.30 <0.30   BNP: No results found for this basename: PROBNP:3 in the last 72 hours D-Dimer: No results found for this basename: DDIMER:2 in the last 72 hours CBG:  Basename 12/28/11 1126 12/28/11 0734 12/27/11 2323  GLUCAP 251* 70 93   Hemoglobin A1C: No results found for this basename: HGBA1C in the last 72 hours Fasting Lipid Panel: No results found for this basename: CHOL,HDL,LDLCALC,TRIG,CHOLHDL,LDLDIRECT in the last 72 hours Thyroid Function Tests: No results found for this basename: TSH,T4TOTAL,FREET4,T3FREE,THYROIDAB in the last 72 hours Anemia Panel: No results found for this basename: VITAMINB12,FOLATE,FERRITIN,TIBC,IRON,RETICCTPCT in the last 72 hours Coagulation: No results found for this basename: LABPROT:2,INR:2 in the last 72 hours Urine Drug Screen: Drugs of Abuse     Component Value Date/Time   LABOPIA POSITIVE* 05/21/2009 2300   COCAINSCRNUR NONE DETECTED 05/21/2009 2300   LABBENZ NONE DETECTED 05/21/2009 2300   AMPHETMU NONE DETECTED 05/21/2009 2300   THCU NONE DETECTED 05/21/2009 2300   LABBARB  Value: NONE DETECTED        DRUG SCREEN FOR  MEDICAL PURPOSES ONLY.  IF CONFIRMATION IS NEEDED FOR ANY PURPOSE, NOTIFY LAB WITHIN 5 DAYS.        LOWEST DETECTABLE LIMITS FOR URINE DRUG SCREEN Drug Class       Cutoff (ng/mL) Amphetamine      1000 Barbiturate      200 Benzodiazepine   200 Tricyclics       300 Opiates          300 Cocaine          300 THC              50 05/21/2009 2300    Alcohol Level: No results found for this basename: ETH:2 in the last 72 hours Urinalysis:  Basename 12/27/11 1739  COLORURINE YELLOW  LABSPEC 1.020  PHURINE 8.0  GLUCOSEU 500*  HGBUR SMALL*  BILIRUBINUR NEGATIVE  KETONESUR NEGATIVE  PROTEINUR >300*  UROBILINOGEN 0.2  NITRITE NEGATIVE  LEUKOCYTESUR SMALL*   Misc.  Labs:   Micro: Recent Results (from the past 240 hour(s))  MRSA PCR SCREENING     Status: Normal   Collection Time   12/28/11  6:27 AM      Component Value Range Status Comment   MRSA by PCR NEGATIVE  NEGATIVE Final     Studies/Results: Ct Abdomen Pelvis W Contrast  12/27/2011  *RADIOLOGY REPORT*  Clinical Data: Abdominal pain.  History of dialysis dependent end- stage renal disease.  CT ABDOMEN AND PELVIS WITH CONTRAST  Technique:  Multidetector CT imaging of the abdomen and pelvis was performed following the standard protocol during bolus administration of intravenous contrast.  Contrast: OMNIPAQUE IOHEXOL 300 MG/ML  SOLN  Comparison: Unenhanced CT dated 10/21/2011.  Findings: The liver, spleen, gallbladder, pancreas, adrenal glands and bowel are unremarkable.  The kidneys show no evidence of obstruction.  The bladder is decompressed.  No free fluid or focal abscess identified.  There is some edema in the subcutaneous tissues.  No evidence of masses, enlarged lymph nodes or bony lesions.  No hernias are identified.  IMPRESSION: No acute abnormalities.   Original Report Authenticated By: Irish Lack, M.D.    Dg Chest Port 1 View  12/27/2011  *RADIOLOGY REPORT*  Clinical Data: Back and abdominal pain.  PORTABLE CHEST - 1 VIEW  Comparison: 12/19/2011  Findings: Right IJ hemodialysis catheter stable in position. Stable mild cardiomegaly.  Lungs clear.  No effusion.  IMPRESSION:  1.  Stable mild cardiomegaly.   Original Report Authenticated By: D. Andria Rhein, MD     Medications:  Scheduled:   . amLODipine  5 mg Oral Daily  . aspirin EC  81 mg Oral Daily  . b complex-vitamin c-folic acid  1 tablet Oral Daily  . cloNIDine  0.1 mg Transdermal Weekly  . docusate sodium  100 mg Oral BID  . epoetin alfa  10,000 Units Intravenous Q T,Th,Sa-HD  . FLUoxetine  20 mg Oral Daily  . gabapentin  300 mg Oral BID  . [COMPLETED] HYDROmorphone  1 mg Intravenous Once  . insulin aspart  0-5  Units Subcutaneous QHS  . insulin aspart  0-9 Units Subcutaneous TID WC  . insulin glargine  5 Units Subcutaneous QHS  . labetalol  300 mg Oral TID  . metoCLOPramide (REGLAN) injection  5 mg Intravenous Q8H  . [COMPLETED] ondansetron  4 mg Intravenous Once  . sevelamer  800 mg Oral TID WC  . [COMPLETED] sodium chloride  250 mL Intravenous Once  . sodium chloride  3 mL Intravenous Q12H  .  vancomycin  1,250 mg Intravenous Q T,Th,Sa-HD  . [COMPLETED] vancomycin  1,000 mg Intravenous Once   Continuous:   . 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 Abdominal pain. According to her nephrologist, Dr. [name], the patient has chronic abdominal pain, thought to be secondary to diabetic gastropathy. CT scan of her abdomen and pelvis is unremarkable. Her lipase and liver transaminases with exception of alkaline phosphatase are unremarkable. We'll continue IV Reglan as ordered.  Reported fever. No reported fever thus far during the hospitalization. Noted per Dr. Lamar Blinks note, the patient underwent thrombectomy of her AV graft and insertion of a dialysis catheter approximately one week ago. Blood cultures have been ordered and are negative to date. We'll continue vancomycin nevertheless.  Chronic pain and drug-seeking behavior. This was discussed with nephrologist Dr. [name] who says that the patient has had chronic pain for years and demonstrates drug-seeking behavior at times.  Anemia, secondary to renal disease. Her hemoglobin is 7.4. It is possible that the patient's  myalgias and other complaints could be a manifestation of symptomatic anemia, in part.  End-stage renal disease. Treatment and dialysis per Dr. [name].  Hypertension. Stable. Continue Catapres and labetalol.  Type 1 diabetes mellitus, with complications. We'll continue sliding scale NovoLog and Lantus. Will adjust dosing accordingly.  Plan:  1. The patient's clinical course and treatment were discussed with Dr. [name]. 2. Decided to transfuse the patient one unit of packed red blood cells. 3. Start H2 blocker. Add Senokot S, DC Colace. 4. Transition metoclopramide to by mouth tomorrow.   LOS: 1 day   Sinthia Karabin 12/28/2011, 1:00 PM

## 2011-12-28 NOTE — H&P (Addendum)
[facility] History and Physical  [name] ZOX:[id number] DOB: 1985-12-24 DOA: 12/27/2011  Referring physician: Leodis Liverpool PCP: [name], MD  Specialists: Nephrology, ID  Chief Complaint: Fever, Generalized Weakness and Pain  HPI: [name] is a 26 y.o. 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 She had been getting Vancomycin with HD until October 25 when her 6 week course was completed. She has not been feeling well since having HD yesterday, she has been having intermittent myalgias and abdominal pain that radiated to her back. She came to the ED this PM c/o new onset fever, chills and generalized body pain and weakness similar to what she had back in September. She has been having issues with her AV graft and had a thrombectomy 5 days ago and insertion of a temporary HD catheter in her SCV 1 week ago. Her temp in ED was 99.9 but given her history a decision was made to obtain blood cultures and admit her for monitoring and IV antibiotics.   Review of Systems:  Review of Systems  Constitutional: Positive for fever, chills, malaise/fatigue and diaphoresis. Negative for weight loss.  HENT: Negative for congestion and sore throat.   Eyes:       Blind  Respiratory: Negative for cough, sputum production and stridor.   Cardiovascular: Negative for chest pain, palpitations and orthopnea.  Gastrointestinal: Positive for nausea. Negative for heartburn, vomiting, abdominal pain, diarrhea and blood in stool.  Genitourinary: Negative for dysuria.  Musculoskeletal: Positive for myalgias.  Skin: Negative.   Neurological: Positive for dizziness, weakness and headaches. Negative for focal weakness.  Psychiatric/Behavioral: Positive for depression. Negative for suicidal ideas,  hallucinations, memory loss and substance abuse. The patient is not nervous/anxious and does not have insomnia.      Past Medical History  Diagnosis Date  . 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 Hypertension   . Hyperlipidemia   . Nephrotic syndrome   . Anemia     Chronic,  Dr. Mariel Sleet,  Transfused, May, 2012  . DVT (deep venous thrombosis)     Bilateral . recurrent  coumadin  . Hypertrophic cardiomyopathy     Diastolic dysfunction  . Legally blind     Bilateral hemorrhagic retinal detachments  . Vaginosis   . History of MRSA infection     Multiple  . Glaucoma(365)   . Warfarin anticoagulation     DVT  . Dialysis patient     M-W-F  . Acute pyelonephritis   . Medical non-compliance   . Manic depressive disorder   . 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 Anemia of renal disease   . Seizures   . Anemia    Past Surgical History  Procedure Date  . Left arm cyst removal   . Incise and drain abcess 04/24/11    Multiple MRSA - March 2013 drain port of abcess  . Placement of port a cath     Poor venous access  . Av fistula placement   . Hematoma evacuation 05/25/2011    Procedure: EVACUATION HEMATOMA;  Surgeon: Sherren Kerns, MD;  Location: [facility] OR;  Service: Vascular;  Laterality: Right;  Evacuation Hematoma Right Arm  . Port-a-cath removal 07/04/2011    Procedure: MINOR REMOVAL PORT-A-CATH;  Surgeon: Loraine Leriche  Val Riles, MD;  Location: [facility] ORS;  Service: General;  Laterality: Left;  Removal Port-A-Cath/Removal Dialysis Catheter in Minor Room  . Tee without cardioversion 10/29/2011    Procedure: TRANSESOPHAGEAL ECHOCARDIOGRAM (TEE);  Surgeon: Kathlen Brunswick, MD;  Location: [facility] ENDO SUITE;  Service: Cardiovascular;  Laterality: N/A;   Social History:  reports that she has never smoked. She does not have any smokeless tobacco history on file. She reports that she does not drink alcohol or use illicit drugs. Lives in  [place] with family members.  Allergies  Allergen Reactions  . Ancef (Cefazolin Sodium) Shortness Of Breath    wheezing  . Coumadin (Warfarin Sodium)     Eyes bleed  . Morphine And Related Itching  . Sulfa Antibiotics Anaphylaxis and Shortness Of Breath  . Tomato Hives and Swelling    Family History  Problem Relation Age of Onset  . Diabetes Mother   . Hypertension Mother   . Diabetes Father    Prior to Admission medications   Medication Sig Start Date End Date Taking? Authorizing Provider  amLODipine (NORVASC) 5 MG tablet Take 1 tablet (5 mg total) by mouth daily. For treatment of high blood pressure. 10/30/11  Yes Elliot Cousin, MD  cloNIDine (CATAPRES - DOSED IN MG/24 HR) 0.1 mg/24hr patch Place 1 patch (0.1 mg total) onto the skin once a week. For treatment of high blood pressure. 10/30/11  Yes Elliot Cousin, MD  FLUoxetine (PROZAC) 20 MG capsule Take 20 mg by mouth daily.   Yes Historical Provider, MD  gabapentin (NEURONTIN) 300 MG capsule Take 1 capsule (300 mg total) by mouth 2 (two) times daily. For treatment of nerve pain in the legs. 10/30/11  Yes Elliot Cousin, MD  insulin glargine (LANTUS) 100 UNIT/ML injection Inject 25 Units into the skin at bedtime. 10/30/11  Yes Elliot Cousin, MD  insulin lispro (HUMALOG) 100 UNIT/ML injection Inject 5-20 Units into the skin 3 (three) times daily before meals. Per sliding scale. Pt takes anywhere from 5 to 20 units.   Yes Historical Provider, MD  labetalol (NORMODYNE) 300 MG tablet Take 1 tablet (300 mg total) by mouth 3 (three) times daily. For treatment of high blood pressure. The dose was increased to 1 tablet 3 times daily. 10/30/11  Yes Elliot Cousin, MD  multivitamin (RENA-VIT) TABS tablet Take 1 tablet by mouth daily.   Yes Historical Provider, MD  sevelamer (RENVELA) 800 MG tablet Take 800 mg by mouth 3 (three) times daily with meals.   Yes Historical Provider, MD  sodium chloride 0.9 % SOLN 250 mL with vancomycin 1000 MG SOLR 1,000 mg  Inject 1,500 mg into the vein every Tuesday, Thursday, and Saturday at 6 PM. Continue through the end of 11/29/2011 for treatment of endocarditis. Dialysis given at 6am 10/30/11  Yes Elliot Cousin, MD   Physical Exam: Filed Vitals:   12/27/11 2100 12/27/11 2200 12/27/11 2214 12/27/11 2301  BP: 134/81 141/87 141/87 155/89  Pulse: 96 95 100 105  Temp:    98.7 F (37.1 C)  TempSrc:    Oral  Resp: 15 14 20 18   Height:    5\' 8"  (1.727 m)  Weight:    74.481 kg (164 lb 3.2 oz)  SpO2: 95% 96% 97% 99%     General:  Chronically ill appearing, mild distress, uncomfortable   Eyes: No redness or eye discharge  ENT: MMM, no exudates  Neck: no adenopathy, supple  Cardiovascular:  SCV right side HD cath in place, insertion site clean.  Tachycardic, regular, no murmur  Respiratory: CTAB  Abdomen: soft, generalized tenderness and mild guarding  Skin: no rashes- AV Graft left arm had palpable thrill  Musculoskeletal: normal strength  Psychiatric: depressed, flat affect  Neurologic: non-focal  Labs on Admission:  Basic Metabolic Panel:  Lab 12/27/11 1610  NA 135  K 4.2  CL 97  CO2 23  GLUCOSE 129*  BUN 37*  CREATININE 7.53*  CALCIUM 9.1  MG --  PHOS --   Liver Function Tests:  Lab 12/27/11 1725  AST 18  ALT 23  ALKPHOS 225*  BILITOT 0.4  PROT 7.6  ALBUMIN 2.7*    Lab 12/27/11 1725  LIPASE 13  AMYLASE --   No results found for this basename: AMMONIA:5 in the last 168 hours CBC:  Lab 12/27/11 1725  WBC 8.4  NEUTROABS 6.6  HGB 8.0*  HCT 24.2*  MCV 92.4  PLT 269   Cardiac Enzymes:  Lab 12/27/11 1725  CKTOTAL --  CKMB --  CKMBINDEX --  TROPONINI <0.30    BNP (last 3 results) No results found for this basename: PROBNP:3 in the last 8760 hours CBG:  Lab 12/27/11 2323  GLUCAP 93    Radiological Exams on Admission: Ct Abdomen Pelvis W Contrast  12/27/2011  *RADIOLOGY REPORT*  Clinical Data: Abdominal pain.  History of dialysis dependent end- stage  renal disease.  CT ABDOMEN AND PELVIS WITH CONTRAST  Technique:  Multidetector CT imaging of the abdomen and pelvis was performed following the standard protocol during bolus administration of intravenous contrast.  Contrast: OMNIPAQUE IOHEXOL 300 MG/ML  SOLN  Comparison: Unenhanced CT dated 10/21/2011.  Findings: The liver, spleen, gallbladder, pancreas, adrenal glands and bowel are unremarkable.  The kidneys show no evidence of obstruction.  The bladder is decompressed.  No free fluid or focal abscess identified.  There is some edema in the subcutaneous tissues.  No evidence of masses, enlarged lymph nodes or bony lesions.  No hernias are identified.  IMPRESSION: No acute abnormalities.   Original Report Authenticated By: Irish Lack, M.D.    Dg Chest Port 1 View  12/27/2011  *RADIOLOGY REPORT*  Clinical Data: Back and abdominal pain.  PORTABLE CHEST - 1 VIEW  Comparison: 12/19/2011  Findings: Right IJ hemodialysis catheter stable in position. Stable mild cardiomegaly.  Lungs clear.  No effusion.  IMPRESSION:  1.  Stable mild cardiomegaly.   Original Report Authenticated By: D. Andria Rhein, MD     Assessment/Plan   1. Fever, Myalgias with history of MSSA bacteremia/endocarditis from HD catheter, recent HD catheter line placement and AV graft surgical interventions concerning for possible sources of re-infection vs. bacterial seeding from prior infection.  Admitted for monitoring and IV Vancomycin  Symptom management with IV pain medication and Tylenol for fever  May need TEE again pending cultures    2. ESRD, HD on T,TH, SAT, orders placed for nephrology consult for HD  3. Peripheral Neurpopathy, resumed gabapentin, fentanyl for pain IV prn, morphine allergy  4. DM Type 1 with multiple complications: started on Lantus and SSI  5. Hypertension, resumed her home medications  6. Abdominal Pain: noted elevated AP, CT of abdomen normal so no evidence of CBD disease, elevated AP may be  related to ESRD,  May consider [place] to eval CBD if pain perists, WBC normal, if back pain predominates then imaging of back may be warranted to r/o bacterial seeding. No clinical signs of back pain or tenderness on exam. Reglan ordered for nausea and possible  treatment of gastroparesis.  Code Status: Full Code Family Communication: Discussed plan in detail with patient, no family at bedside. Disposition Plan:  Will d/c home when medically stable anticipate 2-3 day LOS. Time spent: 70 minutes  [facility] Pager [phone]  If 7PM-7AM, please contact night-coverage [email or web address] Password [facility] 12/28/2011, 12:47 AM

## 2011-12-28 NOTE — Progress Notes (Addendum)
ANTIBIOTIC CONSULT NOTE - INITIAL  Pharmacy Consult for Vancomycin Indication: Recent Bacteremia   Allergies  Allergen Reactions  . Ancef (Cefazolin Sodium) Shortness Of Breath    wheezing  . Coumadin (Warfarin Sodium)     Eyes bleed  . Morphine And Related Itching  . Sulfa Antibiotics Anaphylaxis and Shortness Of Breath  . Tomato Hives and Swelling    Patient Measurements: Height: 5\' 8"  (172.7 cm) Weight: 164 lb 0.4 oz (74.4 kg) IBW/kg (Calculated) : 63.9   Vital Signs: Temp: 99.5 F (37.5 C) (11/23 0628) Temp src: Oral (11/23 0628) BP: 156/92 mmHg (11/23 0628) Pulse Rate: 101  (11/23 0628) Intake/Output from previous day: 11/22 0701 - 11/23 0700 In: 525.3 [I.V.:523.3; IV Piggyback:2] Out: -  Intake/Output from this shift: Total I/O In: 120 [P.O.:120] Out: 201 [Urine:200; Stool:1]  Labs:  [facility] 12/28/11 0500 12/27/11 1725  WBC 6.0 8.4  HGB 7.4* 8.0*  PLT 209 269  LABCREA -- --  CREATININE 8.00* 7.53*   Estimated Creatinine Clearance: 10.8 ml/min (by C-G formula based on Cr of 8). No results found for this basename: VANCOTROUGH:2,VANCOPEAK:2,VANCORANDOM:2,GENTTROUGH:2,GENTPEAK:2,GENTRANDOM:2,TOBRATROUGH:2,TOBRAPEAK:2,TOBRARND:2,AMIKACINPEAK:2,AMIKACINTROU:2,AMIKACIN:2, in the last 72 hours   Microbiology: No results found for this or any previous visit (from the past 720 hour(s)).  Medical History: Past Medical History  Diagnosis Date  . 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 Hypertension   . Hyperlipidemia   . Nephrotic syndrome   . Anemia     Chronic,  Dr. Mariel Sleet,  Transfused, May, 2012  . DVT (deep venous thrombosis)     Bilateral . coumadin stopped b/c of bilateral retinal hemorrhages.  . Hypertrophic cardiomyopathy     Diastolic dysfunction  . Legally blind     Bilateral hemorrhagic retinal detachments  . Vaginosis   . History of MRSA infection     Multiple  . Glaucoma(365)   .  Dialysis patient     M-W-F  . Acute pyelonephritis   . Medical non-compliance   . Manic depressive disorder   . 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 Anemia of renal disease   . Seizures     Medications:  Scheduled:    . amLODipine  5 mg Oral Daily  . aspirin EC  81 mg Oral Daily  . b complex-vitamin c-folic acid  1 tablet Oral Daily  . cloNIDine  0.1 mg Transdermal Weekly  . docusate sodium  100 mg Oral BID  . epoetin alfa  10,000 Units Intravenous Q T,Th,Sa-HD  . FLUoxetine  20 mg Oral Daily  . gabapentin  300 mg Oral BID  . [COMPLETED] HYDROmorphone  1 mg Intravenous Once  . insulin aspart  0-5 Units Subcutaneous QHS  . insulin aspart  0-9 Units Subcutaneous TID WC  . insulin glargine  5 Units Subcutaneous QHS  . labetalol  300 mg Oral TID  . metoCLOPramide (REGLAN) injection  5 mg Intravenous Q8H  . [COMPLETED] ondansetron  4 mg Intravenous Once  . sevelamer  800 mg Oral TID WC  . [COMPLETED] sodium chloride  250 mL Intravenous Once  . sodium chloride  3 mL Intravenous Q12H  . [COMPLETED] vancomycin  1,000 mg Intravenous Once   Assessment: Okay for Protocol Patient treated with IV Vancomycin in September 2013 at [facility].  Pre-HD level slightly below goal with dose of 1gm IV every HD.  Apparently was on Vancomycin at outpatient Dialysis Center as well (1500mg  every HD).  Goal of Therapy:  Pre-Hemodialysis Vancomycin level goal range =15-25 mcg/ml  Plan:  Vancomycin 1250mg  IV every HD. Measure antibiotic drug levels at steady state Follow up culture results  Mady Gemma 12/28/2011,9:05 AM

## 2011-12-29 DIAGNOSIS — I10 Essential (primary) hypertension: Secondary | ICD-10-CM

## 2011-12-29 LAB — GLUCOSE, CAPILLARY
Glucose-Capillary: 176 mg/dL — ABNORMAL HIGH (ref 70–99)
Glucose-Capillary: 161 mg/dL — ABNORMAL HIGH (ref 70–99)

## 2011-12-29 LAB — BASIC METABOLIC PANEL
BUN: 28 mg/dL — ABNORMAL HIGH (ref 6–23)
CO2: 28 mEq/L (ref 19–32)
Calcium: 8.6 mg/dL (ref 8.4–10.5)
Chloride: 95 mEq/L — ABNORMAL LOW (ref 96–112)
Creatinine, Ser: 4.84 mg/dL — ABNORMAL HIGH (ref 0.50–1.10)
GFR calc Af Amer: 13 mL/min — ABNORMAL LOW (ref >90)

## 2011-12-29 LAB — HEPATITIS B SURFACE ANTIGEN: Hepatitis B Surface Ag: NEGATIVE

## 2011-12-29 LAB — CBC
HCT: 28.1 % — ABNORMAL LOW (ref 36.0–46.0)
MCHC: 33.5 g/dL (ref 30.0–36.0)
MCV: 91.5 fL (ref 78.0–100.0)
Platelets: 259 10*3/uL (ref 150–400)
RDW: 16 % — ABNORMAL HIGH (ref 11.5–15.5)
WBC: 6 10*3/uL (ref 4.0–10.5)

## 2011-12-29 LAB — URINE CULTURE: Colony Count: 35000

## 2011-12-29 NOTE — Consult Note (Signed)
Reason for Consult:Bacteremia Referring Physician: [facility] hospital[facility]  [name] is an 26 y.o. female.  HPI: Patient presented to [facility] with fevers and chills and nausea. Suspected bacteremia secondarily to an infected permacath. She does have end-stage renal disease requiring hemodialysis. She currently has an active fistula which is being utilized for treatments. At this time plans for removal of the permacath were discussed with the patient and surgical consultation was obtained.  Past Medical History  Diagnosis Date  . 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 Hypertension   . Hyperlipidemia   . Nephrotic syndrome   . Anemia     Chronic,  Dr. Mariel Sleet,  Transfused, May, 2012  . DVT (deep venous thrombosis)     Bilateral . coumadin stopped b/c of bilateral retinal hemorrhages.  . Hypertrophic cardiomyopathy     Diastolic dysfunction  . Legally blind     Bilateral hemorrhagic retinal detachments  . Vaginosis   . History of MRSA infection     Multiple  . Glaucoma(365)   . Dialysis patient     M-W-F  . Acute pyelonephritis   . Medical non-compliance   . Manic depressive disorder   . 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 Anemia of renal disease   . Seizures   . 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 Drug-seeking behavior 12/28/2011    Past Surgical History  Procedure Date  . Left arm cyst removal   . Incise and drain abcess 04/24/11    Multiple MRSA - March 2013 drain port of abcess  . Placement of port a cath     Poor venous access  . Av fistula placement   . Hematoma evacuation 05/25/2011    Procedure: EVACUATION HEMATOMA;  Surgeon: Sherren Kerns, MD;  Location: [facility] OR;  Service: Vascular;  Laterality: Right;  Evacuation Hematoma Right Arm  . Port-a-cath removal 07/04/2011    Procedure: MINOR REMOVAL PORT-A-CATH;  Surgeon: Dalia Heading, MD;  Location: [facility] ORS;   Service: General;  Laterality: Left;  Removal Port-A-Cath/Removal Dialysis Catheter in Minor Room  . Tee without cardioversion 10/29/2011    Procedure: TRANSESOPHAGEAL ECHOCARDIOGRAM (TEE);  Surgeon: Kathlen Brunswick, MD;  Location: [facility] END[facility] SUITE;  Service: Cardiovascular;  Laterality: N/A;  . Thrombectomy and revision of arterioventous (av) goretex  graft     Family History  Problem Relation Age of Onset  . Diabetes Mother   . Hypertension Mother   . Diabetes Father     Social History:  reports that she has never smoked. She does not have any smokeless tobacco history on file. She reports that she does not drink alcohol or use illicit drugs.  Allergies:  Allergies  Allergen Reactions  . Ancef (Cefazolin Sodium) Shortness Of Breath    wheezing  . Coumadin (Warfarin Sodium)     Eyes bleed  . Morphine And Related Itching  . Sulfa Antibiotics Anaphylaxis and Shortness Of Breath  . Tomato Hives and Swelling    Medications:  I have reviewed the patient's current medications. Prior to Admission:  Prescriptions prior to admission  Medication Sig Dispense Refill  . amLODipine (NORVASC) 5 MG tablet Take 1 tablet (5 mg total) by mouth daily. For treatment of high blood pressure.  30 tablet  3  . cloNIDine (CATAPRES - DOSED IN MG/24 HR) 0.1 mg/24hr patch Place 1 patch (0.1 mg total) onto the skin  once a week. For treatment of high blood pressure.  4 patch  3  . FLUoxetine (PROZAC) 20 MG capsule Take 20 mg by mouth daily.      Marland Kitchen gabapentin (NEURONTIN) 300 MG capsule Take 1 capsule (300 mg total) by mouth 2 (two) times daily. For treatment of nerve pain in the legs.  60 capsule  3  . insulin glargine (LANTUS) 100 UNIT/ML injection Inject 25 Units into the skin at bedtime.      . insulin lispro (HUMALOG) 100 UNIT/ML injection Inject 5-20 Units into the skin 3 (three) times daily before meals. Per sliding scale. Pt takes anywhere from 5 to 20 units.      Marland Kitchen labetalol (NORMODYNE) 300 MG tablet  Take 1 tablet (300 mg total) by mouth 3 (three) times daily. For treatment of high blood pressure. The dose was increased to 1 tablet 3 times daily.  90 tablet  3  . multivitamin (RENA-VIT) TABS tablet Take 1 tablet by mouth daily.      . sevelamer (RENVELA) 800 MG tablet Take 800 mg by mouth 3 (three) times daily with meals.       Scheduled:   . amLODipine  5 mg Oral Daily  . aspirin EC  81 mg Oral Daily  . b complex-vitamin c-folic acid  1 tablet Oral Daily  . cloNIDine  0.1 mg Transdermal Weekly  . epoetin alfa  10,000 Units Intravenous Q T,Th,Sa-HD  . famotidine  20 mg Oral BID  . FLUoxetine  20 mg Oral Daily  . gabapentin  300 mg Oral BID  . insulin aspart  0-5 Units Subcutaneous QHS  . insulin aspart  0-9 Units Subcutaneous TID WC  . insulin glargine  5 Units Subcutaneous QHS  . labetalol  300 mg Oral TID  . metoCLOPramide  5 mg Oral TID AC  . senna-docusate  1 tablet Oral BID  . sevelamer  800 mg Oral TID WC  . sodium chloride  3 mL Intravenous Q12H  . vancomycin  1,250 mg Intravenous Q T,Th,Sa-HD  . [DISCONTINUED] metoCLOPramide (REGLAN) injection  5 mg Intravenous Q8H   Continuous:   Results for orders placed during the hospital encounter of 12/27/11 (from the past 48 hour(s))  CBC WITH DIFFERENTIAL     Status: Abnormal   Collection Time   12/27/11  5:25 PM      Component Value Range Comment   WBC 8.4  4.0 - 10.5 K/uL    RBC 2.62 (*) 3.87 - 5.11 MIL/uL    Hemoglobin 8.0 (*) 12.0 - 15.0 g/dL    HCT 78.2 (*) 95.6 - 46.0 %    MCV 92.4  78.0 - 100.0 fL    MCH 30.5  26.0 - 34.0 pg    MCHC 33.1  30.0 - 36.0 g/dL    RDW 21.3 (*) 08.6 - 15.5 %    Platelets 269  150 - 400 K/uL    Neutrophils Relative 78 (*) 43 - 77 %    Neutro Abs 6.6  1.7 - 7.7 K/uL    Lymphocytes Relative 15  12 - 46 %    Lymphs Abs 1.3  0.7 - 4.0 K/uL    Monocytes Relative 4  3 - 12 %    Monocytes Absolute 0.3  0.1 - 1.0 K/uL    Eosinophils Relative 3  0 - 5 %    Eosinophils Absolute 0.3  0.0 - 0.7  K/uL    Basophils Relative 1  0 - 1 %  Basophils Absolute 0.1  0.0 - 0.1 K/uL   COMPREHENSIVE METABOLIC PANEL     Status: Abnormal   Collection Time   12/27/11  5:25 PM      Component Value Range Comment   Sodium 135  135 - 145 mEq/L    Potassium 4.2  3.5 - 5.1 mEq/L    Chloride 97  96 - 112 mEq/L    CO2 23  19 - 32 mEq/L    Glucose, Bld 129 (*) 70 - 99 mg/dL    BUN 37 (*) 6 - 23 mg/dL    Creatinine, Ser 1.32 (*) 0.50 - 1.10 mg/dL    Calcium 9.1  8.4 - 44.0 mg/dL    Total Protein 7.6  6.0 - 8.3 g/dL    Albumin 2.7 (*) 3.5 - 5.2 g/dL    AST 18  0 - 37 U/L    ALT 23  0 - 35 U/L    Alkaline Phosphatase 225 (*) 39 - 117 U/L    Total Bilirubin 0.4  0.3 - 1.2 mg/dL    GFR calc non Af Amer 7 (*) >90 mL/min    GFR calc Af Amer 8 (*) >90 mL/min   LIPASE, BLOOD     Status: Normal   Collection Time   12/27/11  5:25 PM      Component Value Range Comment   Lipase 13  11 - 59 U/L   CULTURE, BLOOD (ROUTINE X 2)     Status: Normal (Preliminary result)   Collection Time   12/27/11  5:25 PM      Component Value Range Comment   Specimen Description BLOOD RIGHT WRIST      Special Requests BOTTLES DRAWN AEROBIC AND ANAEROBIC 6CC      Culture  Setup Time 12/28/2011 22:12      Culture        Value: STAPHYLOCOCCUS AUREUS     Note: RIFAMPIN AND GENTAMICIN SHOULD NOT BE USED AS SINGLE DRUGS FOR TREATMENT OF STAPH INFECTIONS.     Note: GRAM STAIN REVIEWED-AGREE WITH RESULT Performed at [facility]   Report Status PENDING     LACTIC ACID, PLASMA     Status: Normal   Collection Time   12/27/11  5:25 PM      Component Value Range Comment   Lactic Acid, Venous 0.6  0.5 - 2.2 mmol/L   TROPONIN I     Status: Normal   Collection Time   12/27/11  5:25 PM      Component Value Range Comment   Troponin I <0.30  <0.30 ng/mL   HEMOGLOBIN A1C     Status: Abnormal   Collection Time   12/27/11  5:25 PM      Component Value Range Comment   Hemoglobin A1C 9.9 (*) <5.7 %    Mean Plasma Glucose 237  (*) <117 mg/dL   URINALYSIS, ROUTINE W REFLEX MICROSCOPIC     Status: Abnormal   Collection Time   12/27/11  5:39 PM      Component Value Range Comment   Color, Urine YELLOW  YELLOW    APPearance HAZY (*) CLEAR    Specific Gravity, Urine 1.020  1.005 - 1.030    pH 8.0  5.0 - 8.0    Glucose, UA 500 (*) NEGATIVE mg/dL    Hgb urine dipstick SMALL (*) NEGATIVE    Bilirubin Urine NEGATIVE  NEGATIVE    Ketones, ur NEGATIVE  NEGATIVE mg/dL    Protein, ur >102 (*)  NEGATIVE mg/dL    Urobilinogen, UA 0.2  0.0 - 1.0 mg/dL    Nitrite NEGATIVE  NEGATIVE    Leukocytes, UA SMALL (*) NEGATIVE   URINE CULTURE     Status: Normal   Collection Time   12/27/11  5:39 PM      Component Value Range Comment   Specimen Description URINE, CLEAN CATCH      Special Requests NONE      Culture  Setup Time 12/28/2011 05:34      Colony Count 35,000 COLONIES/ML      Culture        Value: Multiple bacterial morphotypes present, none predominant. Suggest appropriate recollection if clinically indicated.   Report Status 12/29/2011 FINAL     URINE MICROSCOPIC-ADD ON     Status: Abnormal   Collection Time   12/27/11  5:39 PM      Component Value Range Comment   Squamous Epithelial / LPF MANY (*) RARE    WBC, UA 21-50  <3 WBC/hpf    RBC / HPF 3-6  <3 RBC/hpf    Bacteria, UA FEW (*) RARE   CULTURE, BLOOD (ROUTINE X 2)     Status: Normal (Preliminary result)   Collection Time   12/27/11  5:40 PM      Component Value Range Comment   Specimen Description BLOOD RIGHT ARM      Special Requests BOTTLES DRAWN AEROBIC ONLY 6CC      Culture  Setup Time 12/28/2011 22:13      Culture        Value: STAPHYLOCOCCUS AUREUS     Note: SUSCEPTIBILITIES PERFORMED ON PREVIOUS CULTURE WITHIN THE LAST 5 DAYS.     Note: GRAM STAIN REVIEWED-AGREE WITH RESULT Performed at [facility]   Report Status PENDING     GLUCOSE, CAPILLARY     Status: Normal   Collection Time   12/27/11 11:23 PM      Component Value Range Comment    Glucose-Capillary 93  70 - 99 mg/dL   MAGNESIUM     Status: Normal   Collection Time   12/28/11  5:00 AM      Component Value Range Comment   Magnesium 2.0  1.5 - 2.5 mg/dL   PHOSPHORUS     Status: Abnormal   Collection Time   12/28/11  5:00 AM      Component Value Range Comment   Phosphorus 9.0 (*) 2.3 - 4.6 mg/dL   TROPONIN I     Status: Normal   Collection Time   12/28/11  5:00 AM      Component Value Range Comment   Troponin I <0.30  <0.30 ng/mL   COMPREHENSIVE METABOLIC PANEL     Status: Abnormal   Collection Time   12/28/11  5:00 AM      Component Value Range Comment   Sodium 132 (*) 135 - 145 mEq/L    Potassium 4.6  3.5 - 5.1 mEq/L    Chloride 95 (*) 96 - 112 mEq/L    CO2 22  19 - 32 mEq/L    Glucose, Bld 78  70 - 99 mg/dL    BUN 40 (*) 6 - 23 mg/dL    Creatinine, Ser 1.61 (*) 0.50 - 1.10 mg/dL    Calcium 8.7  8.4 - 09.6 mg/dL    Total Protein 7.3  6.0 - 8.3 g/dL    Albumin 2.4 (*) 3.5 - 5.2 g/dL    AST 17  0 - 37 U/L  ALT 20  0 - 35 U/L    Alkaline Phosphatase 218 (*) 39 - 117 U/L    Total Bilirubin 0.5  0.3 - 1.2 mg/dL    GFR calc non Af Amer 6 (*) >90 mL/min    GFR calc Af Amer 7 (*) >90 mL/min   CBC     Status: Abnormal   Collection Time   12/28/11  5:00 AM      Component Value Range Comment   WBC 6.0  4.0 - 10.5 K/uL    RBC 2.47 (*) 3.87 - 5.11 MIL/uL    Hemoglobin 7.4 (*) 12.0 - 15.0 g/dL    HCT 16.1 (*) 09.6 - 46.0 %    MCV 92.7  78.0 - 100.0 fL    MCH 30.0  26.0 - 34.0 pg    MCHC 32.3  30.0 - 36.0 g/dL    RDW 04.5 (*) 40.9 - 15.5 %    Platelets 209  150 - 400 K/uL   MRSA PCR SCREENING     Status: Normal   Collection Time   12/28/11  6:27 AM      Component Value Range Comment   MRSA by PCR NEGATIVE  NEGATIVE   GLUCOSE, CAPILLARY     Status: Normal   Collection Time   12/28/11  7:34 AM      Component Value Range Comment   Glucose-Capillary 70  70 - 99 mg/dL    Comment 1 Documented in Chart      Comment 2 Notify RN     GLUCOSE, CAPILLARY      Status: Abnormal   Collection Time   12/28/11 11:26 AM      Component Value Range Comment   Glucose-Capillary 251 (*) 70 - 99 mg/dL    Comment 1 Documented in Chart      Comment 2 Notify RN     TROPONIN I     Status: Normal   Collection Time   12/28/11 11:54 AM      Component Value Range Comment   Troponin I <0.30  <0.30 ng/mL   TYPE AND SCREEN     Status: Normal (Preliminary result)   Collection Time   12/28/11 11:54 AM      Component Value Range Comment   ABO/RH(D) O POS      Antibody Screen NEG      Sample Expiration 12/31/2011      Unit Number [id number]      Blood Component Type RED CELLS,LR      Unit division 00      Status of Unit ISSUED      Transfusion Status OK TO TRANSFUSE      Crossmatch Result Compatible      Unit Number [id number]      Blood Component Type RED CELLS,LR      Unit division 00      Status of Unit ALLOCATED      Transfusion Status OK TO TRANSFUSE      Crossmatch Result Compatible     PREPARE RBC (CROSSMATCH)     Status: Normal   Collection Time   12/28/11 11:54 AM      Component Value Range Comment   Order Confirmation ORDER PROCESSED BY BLOOD BANK     HEPATITIS B SURFACE ANTIGEN     Status: Normal   Collection Time   12/28/11  2:45 PM      Component Value Range Comment   Hepatitis B Surface Ag NEGATIVE  NEGATIVE   GLUCOSE,  CAPILLARY     Status: Abnormal   Collection Time   12/28/11  7:15 PM      Component Value Range Comment   Glucose-Capillary 111 (*) 70 - 99 mg/dL    Comment 1 Documented in Chart      Comment 2 Notify RN     GLUCOSE, CAPILLARY     Status: Abnormal   Collection Time   12/28/11  9:01 PM      Component Value Range Comment   Glucose-Capillary 168 (*) 70 - 99 mg/dL   BASIC METABOLIC PANEL     Status: Abnormal   Collection Time   12/29/11  6:40 AM      Component Value Range Comment   Sodium 133 (*) 135 - 145 mEq/L    Potassium 4.4  3.5 - 5.1 mEq/L    Chloride 95 (*) 96 - 112 mEq/L    CO2 28  19 - 32 mEq/L     Glucose, Bld 192 (*) 70 - 99 mg/dL    BUN 28 (*) 6 - 23 mg/dL    Creatinine, Ser 0.98 (*) 0.50 - 1.10 mg/dL DELTA CHECK NOTED   Calcium 8.6  8.4 - 10.5 mg/dL    GFR calc non Af Amer 12 (*) >90 mL/min    GFR calc Af Amer 13 (*) >90 mL/min   CBC     Status: Abnormal   Collection Time   12/29/11  6:40 AM      Component Value Range Comment   WBC 6.0  4.0 - 10.5 K/uL    RBC 3.07 (*) 3.87 - 5.11 MIL/uL    Hemoglobin 9.4 (*) 12.0 - 15.0 g/dL    HCT 11.9 (*) 14.7 - 46.0 %    MCV 91.5  78.0 - 100.0 fL    MCH 30.6  26.0 - 34.0 pg    MCHC 33.5  30.0 - 36.0 g/dL    RDW 82.9 (*) 56.2 - 15.5 %    Platelets 259  150 - 400 K/uL   PHOSPHORUS     Status: Abnormal   Collection Time   12/29/11  6:40 AM      Component Value Range Comment   Phosphorus 5.9 (*) 2.3 - 4.6 mg/dL   GLUCOSE, CAPILLARY     Status: Abnormal   Collection Time   12/29/11  7:24 AM      Component Value Range Comment   Glucose-Capillary 176 (*) 70 - 99 mg/dL    Comment 1 Notify RN     GLUCOSE, CAPILLARY     Status: Abnormal   Collection Time   12/29/11 11:53 AM      Component Value Range Comment   Glucose-Capillary 266 (*) 70 - 99 mg/dL    Comment 1 Notify RN       Ct Abdomen Pelvis W Contrast  12/27/2011  *RADIOLOGY REPORT*  Clinical Data: Abdominal pain.  History of dialysis dependent end- stage renal disease.  CT ABDOMEN AND PELVIS WITH CONTRAST  Technique:  Multidetector CT imaging of the abdomen and pelvis was performed following the standard protocol during bolus administration of intravenous contrast.  Contrast: OMNIPAQUE IOHEXOL 300 MG/ML  SOLN  Comparison: Unenhanced CT dated 10/21/2011.  Findings: The liver, spleen, gallbladder, pancreas, adrenal glands and bowel are unremarkable.  The kidneys show no evidence of obstruction.  The bladder is decompressed.  No free fluid or focal abscess identified.  There is some edema in the subcutaneous tissues.  No evidence of masses, enlarged lymph nodes  or bony lesions.  No  hernias are identified.  IMPRESSION: No acute abnormalities.   Original Report Authenticated By: Irish Lack, M.D.    Dg Chest Port 1 View  12/27/2011  *RADIOLOGY REPORT*  Clinical Data: Back and abdominal pain.  PORTABLE CHEST - 1 VIEW  Comparison: 12/19/2011  Findings: Right IJ hemodialysis catheter stable in position. Stable mild cardiomegaly.  Lungs clear.  No effusion.  IMPRESSION:  1.  Stable mild cardiomegaly.   Original Report Authenticated By: D. Andria Rhein, MD     Review of Systems  Constitutional: Positive for fever, chills and diaphoresis.  HENT: Negative.   Eyes: Negative.   Respiratory: Negative.   Cardiovascular: Negative.   Gastrointestinal: Negative.   Genitourinary: Negative.   Musculoskeletal: Negative.   Skin: Negative.   Neurological: Negative.   Endo/Heme/Allergies: Negative.   Psychiatric/Behavioral: Negative.    Blood pressure 113/72, pulse 91, temperature 98.2 F (36.8 C), temperature source Oral, resp. rate 20, height 5\' 8"  (1.727 m), weight 73.6 kg (162 lb 4.1 oz), last menstrual period 12/13/2011, SpO2 100.00%. Physical Exam  Constitutional: She is oriented to person, place, and time. She appears well-developed and well-nourished. No distress.  HENT:  Head: Normocephalic and atraumatic.  Eyes: Conjunctivae normal and EOM are normal. Pupils are equal, round, and reactive to light. No scleral icterus.  Neck: Normal range of motion. Neck supple. No tracheal deviation present.  Cardiovascular: Normal rate and regular rhythm.   Respiratory: Effort normal and breath sounds normal. No respiratory distress.  GI: Soft.  Lymphadenopathy:    She has no cervical adenopathy.  Neurological: She is alert and oriented to person, place, and time.    Assessment/Plan: Bacteremia. Suspected infected permacath. Removal was discussed with the patient. She did express some anxiety about removal and due to the high palpable cuff on the permacath and do feel patient would  benefit from doing this in the minor procedure room where he can slightly sedate her. Her questions and concerns were addressed and we'll plan to proceed tomorrow to the procedure room.  Deisha Stull C 12/29/2011, 3:37 PM

## 2011-12-29 NOTE — Progress Notes (Signed)
Subjective: The patient is sitting up in the chair. She ate all of not most of her breakfast. She has less abdominal pain and nausea.  Objective: Vital signs in last 24 hours: Filed Vitals:   12/28/11 2104 12/29/11 0120 12/29/11 0221 12/29/11 0549  BP: 144/83  104/52 166/89  Pulse: 93  55 92  Temp: 98.5 F (36.9 C)  98.2 F (36.8 C) 98.5 F (36.9 C)  TempSrc:   Oral Oral  Resp:   18 20  Height:      Weight:  73.6 kg (162 lb 4.1 oz)    SpO2: 100%  96% 100%    Intake/Output Summary (Last 24 hours) at 12/29/11 1058 Last data filed at 12/29/11 0532  Gross per 24 hour  Intake   1488 ml  Output   3100 ml  Net  -1612 ml    Weight change: 14.4 kg (31 lb 11.9 oz)  Physical exam: General: Alert 26 year old African-American woman sitting up in bed, in no acute distress. Lungs: Clear to auscultation bilaterally. Heart: S1, S2, with a soft systolic murmur. Abdomen: Positive bowel sounds, soft, mildly tender in the hypogastrium, without distention guarding or masses palpated. Extremities: AV graft left upper extremity with thrill, no surrounding erythema. Skin: Dialysis catheter located at the right upper chest. No surrounding erythema or drainage.  Lab Results: Basic Metabolic Panel:  Basename 12/29/11 0640 12/28/11 0500  NA 133* 132*  K 4.4 4.6  CL 95* 95*  CO2 28 22  GLUCOSE 192* 78  BUN 28* 40*  CREATININE 4.84* 8.00*  CALCIUM 8.6 8.7  MG -- 2.0  PHOS 5.9* 9.0*   Liver Function Tests:  Basename 12/28/11 0500 12/27/11 1725  AST 17 18  ALT 20 23  ALKPHOS 218* 225*  BILITOT 0.5 0.4  PROT 7.3 7.6  ALBUMIN 2.4* 2.7*    Basename 12/27/11 1725  LIPASE 13  AMYLASE --   No results found for this basename: AMMONIA:2 in the last 72 hours CBC:  Basename 12/29/11 0640 12/28/11 0500 12/27/11 1725  WBC 6.0 6.0 --  NEUTROABS -- -- 6.6  HGB 9.4* 7.4* --  HCT 28.1* 22.9* --  MCV 91.5 92.7 --  PLT 259 209 --   Cardiac Enzymes:  Basename 12/28/11 1154 12/28/11 0500  12/27/11 1725  CKTOTAL -- -- --  CKMB -- -- --  CKMBINDEX -- -- --  TROPONINI <0.30 <0.30 <0.30   BNP: No results found for this basename: PROBNP:3 in the last 72 hours D-Dimer: No results found for this basename: DDIMER:2 in the last 72 hours CBG:  Basename 12/29/11 0724 12/28/11 2101 12/28/11 1915 12/28/11 1126 12/28/11 0734 12/27/11 2323  GLUCAP 176* 168* 111* 251* 70 93   Hemoglobin A1C:  Basename 12/27/11 1725  HGBA1C 9.9*   Fasting Lipid Panel: No results found for this basename: CHOL,HDL,LDLCALC,TRIG,CHOLHDL,LDLDIRECT in the last 72 hours Thyroid Function Tests: No results found for this basename: TSH,T4TOTAL,FREET4,T3FREE,THYROIDAB in the last 72 hours Anemia Panel: No results found for this basename: VITAMINB12,FOLATE,FERRITIN,TIBC,IRON,RETICCTPCT in the last 72 hours Coagulation: No results found for this basename: LABPROT:2,INR:2 in the last 72 hours Urine Drug Screen: Drugs of Abuse     Component Value Date/Time   LABOPIA POSITIVE* 05/21/2009 2300   COCAINSCRNUR NONE DETECTED 05/21/2009 2300   LABBENZ NONE DETECTED 05/21/2009 2300   AMPHETMU NONE DETECTED 05/21/2009 2300   THCU NONE DETECTED 05/21/2009 2300   LABBARB  Value: NONE DETECTED        DRUG SCREEN FOR MEDICAL PURPOSES ONLY.  IF CONFIRMATION IS NEEDED FOR ANY PURPOSE, NOTIFY LAB WITHIN 5 DAYS.        LOWEST DETECTABLE LIMITS FOR URINE DRUG SCREEN Drug Class       Cutoff (ng/mL) Amphetamine      1000 Barbiturate      200 Benzodiazepine   200 Tricyclics       300 Opiates          300 Cocaine          300 THC              50 05/21/2009 2300    Alcohol Level: No results found for this basename: ETH:2 in the last 72 hours Urinalysis:  Basename 12/27/11 1739  COLORURINE YELLOW  LABSPEC 1.020  PHURINE 8.0  GLUCOSEU 500*  HGBUR SMALL*  BILIRUBINUR NEGATIVE  KETONESUR NEGATIVE  PROTEINUR >300*  UROBILINOGEN 0.2  NITRITE NEGATIVE  LEUKOCYTESUR SMALL*   Misc. Labs:   Micro: Recent Results (from the past  240 hour(s))  CULTURE, BLOOD (ROUTINE X 2)     Status: Normal (Preliminary result)   Collection Time   12/27/11  5:25 PM      Component Value Range Status Comment   Specimen Description BLOOD RIGHT WRIST   Final    Special Requests BOTTLES DRAWN AEROBIC AND ANAEROBIC [facility]   Final    Culture  Setup Time 12/28/2011 22:12   Final    Culture     Final    Value: STAPHYLOCOCCUS AUREUS     Note: RIFAMPIN AND GENTAMICIN SHOULD NOT BE USED AS SINGLE DRUGS FOR TREATMENT OF STAPH INFECTIONS.     Note: GRAM STAIN REVIEWED-AGREE WITH RESULT Performed at [facility]   Report Status PENDING   Incomplete   CULTURE, BLOOD (ROUTINE X 2)     Status: Normal (Preliminary result)   Collection Time   12/27/11  5:40 PM      Component Value Range Status Comment   Specimen Description BLOOD RIGHT ARM   Final    Special Requests BOTTLES DRAWN AEROBIC ONLY [facility]   Final    Culture  Setup Time 12/28/2011 22:13   Final    Culture     Final    Value: STAPHYLOCOCCUS AUREUS     Note: SUSCEPTIBILITIES PERFORMED ON PREVIOUS CULTURE WITHIN THE LAST 5 DAYS.     Note: GRAM STAIN REVIEWED-AGREE WITH RESULT Performed at [facility]   Report Status PENDING   Incomplete   MRSA PCR SCREENING     Status: Normal   Collection Time   12/28/11  6:27 AM      Component Value Range Status Comment   MRSA by PCR NEGATIVE  NEGATIVE Final     Studies/Results: Ct Abdomen Pelvis W Contrast  12/27/2011  *RADIOLOGY REPORT*  Clinical Data: Abdominal pain.  History of dialysis dependent end- stage renal disease.  CT ABDOMEN AND PELVIS WITH CONTRAST  Technique:  Multidetector CT imaging of the abdomen and pelvis was performed following the standard protocol during bolus administration of intravenous contrast.  Contrast: OMNIPAQUE IOHEXOL 300 MG/ML  SOLN  Comparison: Unenhanced CT dated 10/21/2011.  Findings: The liver, spleen, gallbladder, pancreas, adrenal glands and bowel are unremarkable.  The kidneys show no evidence of  obstruction.  The bladder is decompressed.  No free fluid or focal abscess identified.  There is some edema in the subcutaneous tissues.  No evidence of masses, enlarged lymph nodes or bony lesions.  No hernias are identified.  IMPRESSION: No acute  abnormalities.   Original Report Authenticated By: Irish Lack, M.D.    Dg Chest Port 1 View  12/27/2011  *RADIOLOGY REPORT*  Clinical Data: Back and abdominal pain.  PORTABLE CHEST - 1 VIEW  Comparison: 12/19/2011  Findings: Right IJ hemodialysis catheter stable in position. Stable mild cardiomegaly.  Lungs clear.  No effusion.  IMPRESSION:  1.  Stable mild cardiomegaly.   Original Report Authenticated By: D. Andria Rhein, MD     Medications:  Scheduled:    . amLODipine  5 mg Oral Daily  . aspirin EC  81 mg Oral Daily  . b complex-vitamin c-folic acid  1 tablet Oral Daily  . cloNIDine  0.1 mg Transdermal Weekly  . epoetin alfa  10,000 Units Intravenous Q T,Th,Sa-HD  . famotidine  20 mg Oral BID  . FLUoxetine  20 mg Oral Daily  . gabapentin  300 mg Oral BID  . insulin aspart  0-5 Units Subcutaneous QHS  . insulin aspart  0-9 Units Subcutaneous TID WC  . insulin glargine  5 Units Subcutaneous QHS  . labetalol  300 mg Oral TID  . metoCLOPramide (REGLAN) injection  5 mg Intravenous Q8H  . senna-docusate  1 tablet Oral BID  . sevelamer  800 mg Oral TID WC  . sodium chloride  3 mL Intravenous Q12H  . vancomycin  1,250 mg Intravenous Q T,Th,Sa-HD  . [DISCONTINUED] docusate sodium  100 mg Oral BID   Continuous:  GNF:AOZHYQMVHQION, acetaminophen, alum & mag hydroxide-simeth, fentaNYL, polyethylene glycol, traZODone  Assessment: Principal Problem:  *Abdominal pain, generalized Active Problems:  Generalized weakness  DM (diabetes mellitus), type 1 with renal complications  Hypertension  Type 1 diabetes mellitus with diabetic gastropathy  Drug-seeking behavior  End stage renal disease  Staphylococcus aureus bacteremia  Anemia of renal  disease   1. Abdominal pain. According to her nephrologist, Dr. [name], the patient has chronic abdominal pain, thought to be secondary to diabetic gastropathy. CT scan of her abdomen and pelvis is unremarkable. Her lipase and liver transaminases, with exception of alkaline phosphatase, are unremarkable. She is currently on IV Reglan, but this will be transitioned to by mouth.  Recurrent staph aureus bacteremia. Vancomycin was started empirically on admission.  The patient underwent thrombectomy of her AV graft and insertion of a dialysis catheter approximately one week ago. The patient was scheduled to have the temporary hemodialysis catheter removed in [facility] tomorrow, but I have consulted general surgeon Dr. [name] to assist with removal of the catheter as it is likely the nidus of infection. She is status post treatment of Staphylococcus aureus bacteremia in September. She completed a six-week course of vancomycin.  Chronic pain and drug-seeking behavior. This was discussed with nephrologist Dr. [name] who says that the patient has had chronic pain for years and demonstrates drug-seeking behavior at times.  Anemia, secondary to renal disease. Her was hemoglobin is 7.4. Status post 1 unit of packed red blood cells. Improvement in hemoglobin.  End-stage renal disease. Treatment and dialysis per Dr. [name].  Hypertension. Stable. Continue Catapres and labetalol.  Type 1 diabetes mellitus, with complications. We'll continue sliding scale NovoLog and Lantus. Will adjust dosing accordingly.  Plan:  1. Change metoclopramide  to by mouth. 2. Await Dr. Illene Regulus assessment and hopefully removal of the temporary hemodialysis catheter. 3. It is likely that the patient will need another 4-6 weeks of IV vancomycin, but will await the sensitivities to make sure her the staff is not Vanco resistant.   LOS: 2 days  Candy Ziegler 12/29/2011, 10:58 AM

## 2011-12-29 NOTE — Progress Notes (Signed)
Subjective: Interval History: has complaints nausea but no vomiting. Abdominal pain is getting better. She feels weak. She denies any difficulty breathing..  Objective: Vital signs in last 24 hours: Temp:  [97.5 F (36.4 C)-98.5 F (36.9 C)] 98.5 F (36.9 C) (11/24 0549) Pulse Rate:  [55-93] 92  (11/24 0549) Resp:  [14-20] 20  (11/24 0549) BP: (104-172)/(52-98) 166/89 mmHg (11/24 0549) SpO2:  [96 %-100 %] 100 % (11/24 0549) Weight:  [73.6 kg (162 lb 4.1 oz)-74.4 kg (164 lb 0.4 oz)] 73.6 kg (162 lb 4.1 oz) (11/24 0120) Weight change: 14.4 kg (31 lb 11.9 oz)  Intake/Output from previous day: 11/23 0701 - 11/24 0700 In: 1608 [P.O.:840; I.V.:53; Blood:350; IV Piggyback:255] Out: 3301 [Urine:350; Stool:1] Intake/Output this shift:    General appearance: alert, cooperative and no distress Resp: clear to auscultation bilaterally Cardio: regular rate and rhythm, S1, S2 normal, no murmur, click, rub or gallop GI: soft, non-tender; bowel sounds normal; no masses,  no organomegaly Extremities: extremities normal, atraumatic, no cyanosis or edema  Lab Results:  Basename 12/29/11 0640 12/28/11 0500  WBC 6.0 6.0  HGB 9.4* 7.4*  HCT 28.1* 22.9*  PLT 259 209   BMET:  Basename 12/29/11 0640 12/28/11 0500  NA 133* 132*  K 4.4 4.6  CL 95* 95*  CO2 28 22  GLUCOSE 192* 78  BUN 28* 40*  CREATININE 4.84* 8.00*  CALCIUM 8.6 8.7   No results found for this basename: PTH:2 in the last 72 hours Iron Studies: No results found for this basename: IRON,TIBC,TRANSFERRIN,FERRITIN in the last 72 hours  Studies/Results: Ct Abdomen Pelvis W Contrast  12/27/2011  *RADIOLOGY REPORT*  Clinical Data: Abdominal pain.  History of dialysis dependent end- stage renal disease.  CT ABDOMEN AND PELVIS WITH CONTRAST  Technique:  Multidetector CT imaging of the abdomen and pelvis was performed following the standard protocol during bolus administration of intravenous contrast.  Contrast: OMNIPAQUE IOHEXOL  300 MG/ML  SOLN  Comparison: Unenhanced CT dated 10/21/2011.  Findings: The liver, spleen, gallbladder, pancreas, adrenal glands and bowel are unremarkable.  The kidneys show no evidence of obstruction.  The bladder is decompressed.  No free fluid or focal abscess identified.  There is some edema in the subcutaneous tissues.  No evidence of masses, enlarged lymph nodes or bony lesions.  No hernias are identified.  IMPRESSION: No acute abnormalities.   Original Report Authenticated By: Irish Lack, M.D.    Dg Chest Port 1 View  12/27/2011  *RADIOLOGY REPORT*  Clinical Data: Back and abdominal pain.  PORTABLE CHEST - 1 VIEW  Comparison: 12/19/2011  Findings: Right IJ hemodialysis catheter stable in position. Stable mild cardiomegaly.  Lungs clear.  No effusion.  IMPRESSION:  1.  Stable mild cardiomegaly.   Original Report Authenticated By: D. Andria Rhein, MD     I have reviewed the patient's current medications.  Assessment/Plan: Problem #1 end-stage renal disease she status post hemodialysis yesterday he bends 28 creatinine is 4.84 was normal potassium. Problem #2 anemia she status post blood transfusion her hemoglobin and hematocrit has improved Problem #3 history of nausea vomiting presently seems to be getting better. This is a recurrent problem. Most likely secondary to diabetic gastropathy. Problem #4 history of diabetes Problem #5 hypertension her blood pressure seems to be reasonably controlled.  Problem #6 metabolic bone disease calcium is was in acceptable range phosphorus has improved to 5.9. Patient is noncompliant with her medication and diet and when she came her phosphorus was very high. Problem #  7 diabetic retinopathy Problem #8 history of chronic back pain. Plan: We'll continue his present management Next dialysis will be on Tuesday but will follow her blood work and make a decision if she need extra treatment tomorrow.   LOS: 2 days   Ileta Ofarrell S 12/29/2011,8:31  AM

## 2011-12-30 ENCOUNTER — Encounter (HOSPITAL_COMMUNITY): Payer: Self-pay | Admitting: *Deleted

## 2011-12-30 ENCOUNTER — Encounter (HOSPITAL_COMMUNITY)
Admission: EM | Disposition: A | Discharge status: Home / Self Care | Payer: Self-pay | Source: Home / Self Care | Attending: Internal Medicine

## 2011-12-30 DIAGNOSIS — I319 Disease of pericardium, unspecified: Secondary | ICD-10-CM

## 2011-12-30 HISTORY — PX: PORT-A-CATH REMOVAL: SHX5289

## 2011-12-30 LAB — CBC
HCT: 30 % — ABNORMAL LOW (ref 36.0–46.0)
Hemoglobin: 9.9 g/dL — ABNORMAL LOW (ref 12.0–15.0)
MCV: 92.6 fL (ref 78.0–100.0)
RBC: 3.24 MIL/uL — ABNORMAL LOW (ref 3.87–5.11)
WBC: 6.3 10*3/uL (ref 4.0–10.5)

## 2011-12-30 LAB — BASIC METABOLIC PANEL
CO2: 25 mEq/L (ref 19–32)
Chloride: 92 mEq/L — ABNORMAL LOW (ref 96–112)
Sodium: 130 mEq/L — ABNORMAL LOW (ref 135–145)

## 2011-12-30 LAB — GLUCOSE, CAPILLARY
Glucose-Capillary: 135 mg/dL — ABNORMAL HIGH (ref 70–99)
Glucose-Capillary: 162 mg/dL — ABNORMAL HIGH (ref 70–99)

## 2011-12-30 LAB — CULTURE, BLOOD (ROUTINE X 2)

## 2011-12-30 SURGERY — MINOR REMOVAL PORT-A-CATH
Anesthesia: LOCAL

## 2011-12-30 MED ORDER — LIDOCAINE HCL (PF) 1 % IJ SOLN
INTRAMUSCULAR | Status: AC
  Filled 2011-12-30: qty 30

## 2011-12-30 MED ORDER — METOCLOPRAMIDE HCL 10 MG PO TABS
5.0000 mg | ORAL_TABLET | Freq: Three times a day (TID) | ORAL | Status: DC
  Administered 2011-12-30 – 2011-12-31 (×6): 5 mg via ORAL
  Filled 2011-12-30 (×6): qty 1

## 2011-12-30 NOTE — Progress Notes (Signed)
Subjective: Interval History: has no complaint of nausea or vomiting. She has is still intermittent abdominal pain. She denies any fever chills or sweating. Her appetite is not that great..  Objective: Vital signs in last 24 hours: Temp:  [97.8 F (36.6 C)-98.2 F (36.8 C)] 97.8 F (36.6 C) (11/25 0602) Pulse Rate:  [58-91] 58  (11/25 0602) Resp:  [18-20] 20  (11/25 0602) BP: (113-152)/(72-91) 152/91 mmHg (11/25 0602) SpO2:  [100 %] 100 % (11/25 0602) Weight:  [73.6 kg (162 lb 4.1 oz)] 73.6 kg (162 lb 4.1 oz) (11/25 0500) Weight change: -0.8 kg (-1 lb 12.2 oz)  Intake/Output from previous day: 11/24 0701 - 11/25 0700 In: 485 [P.O.:480; I.V.:3; IV Piggyback:2] Out: 0  Intake/Output this shift:    General appearance: alert, cooperative and no distress Resp: clear to auscultation bilaterally Cardio: regular rate and rhythm, S1, S2 normal, no murmur, click, rub or gallop GI: soft, non-tender; bowel sounds normal; no masses,  no organomegaly Extremities: extremities normal, atraumatic, no cyanosis or edema  Lab Results:  [facility] 12/30/11 0514 12/29/11 0640  WBC 6.3 6.0  HGB 9.9* 9.4*  HCT 30.0* 28.1*  PLT 324 259   BMET:  Basename 12/30/11 0514 12/29/11 0640  NA 130* 133*  K 4.8 4.4  CL 92* 95*  CO2 25 28  GLUCOSE 161* 192*  BUN 43* 28*  CREATININE 6.47* 4.84*  CALCIUM 9.1 8.6   No results found for this basename: PTH:2 in the last 72 hours Iron Studies: No results found for this basename: IRON,TIBC,TRANSFERRIN,FERRITIN in the last 72 hours  Studies/Results: No results found.  I have reviewed the patient's current medications.  Assessment/Plan: Problem #1 end-stage renal disease she status post hemodialysis on Saturday her BUN is 43 and creatinine 6.47 stable. Her potassium is 4.8. Problem #2 anemia her hemoglobin is 9.9 hematocrit 30 stable patient is on Epogen. Problem #3 history of staph bacteremia. Presently patient is a febrile. She has a catheter which was  put about 10 days ago and she has graft which was declotted last week. Presently she is a febrile and her white blood cell count is normal. Problem #4 history of diabetes Problem #5 history of hypertension blood pressure seems to be controlled. Problem #6 metabolic disease she is on a phosphorus binder phosphorus has improved Problem #7 abdominal pain, nausea vomiting at this moment seems to be getting better. Plan: We'll remove catheter today We'll make arrangements for patient to get dialysis tomorrow Once a week is a sensitivity then we'll continue his vancomycin and make a decision whether we can't rifampin as patient had MRSA from before. We'll check her CBC and basic metabolic panel in the morning.    LOS: 3 days   Dany Walther S 12/30/2011,7:43 AM

## 2011-12-30 NOTE — Op Note (Signed)
Patient:  [name]  DOB:  1985-10-07  MRN:  [id number]   Preop Diagnosis:  Infected permacath  Postop Diagnosis:  The same  Procedure:  Permacath removal  Surgeon:  Dr. Tilford Pillar  Anes:  1% lidocaine plain  Indications:  Patient presented to [facility] with symptoms consistent with bacteremia. Her workup was suspicious for an infected permacath. Risks benefits alternatives a ruler discussed with patient. She was consented for the planned procedure.  Procedure note:  For patient comfort patient was brought to the minor procedure room to proceed with a perm cath removal. The site was prepped with Betadine solution and draped. 1% lidocaine was instilled. The sutures were removed sharply. The catheter was easily removed as the cuff and not scarred into the subcuticular space.  Pressure was held. Hemostasis obtained. Sterile dressings were placed. Patient tolerated procedure she well. All sharps were disposed of in proper accordance  Complications:  None apparent  EBL:  Minimal  Specimen:  Catheter tip

## 2011-12-30 NOTE — Progress Notes (Signed)
Subjective: The patient feels better. Less nausea and less abdominal pain.  Objective: Vital signs in last 24 hours: Filed Vitals:   12/29/11 1429 12/29/11 2044 12/30/11 0500 12/30/11 0602  BP: 113/72 136/80  152/91  Pulse: 91 90  58  Temp: 98.2 F (36.8 C) 97.9 F (36.6 C)  97.8 F (36.6 C)  TempSrc:  Oral  Oral  Resp: 20 18  20   Height:      Weight:   73.6 kg (162 lb 4.1 oz)   SpO2: 100% 100%  100%    Intake/Output Summary (Last 24 hours) at 12/30/11 0745 Last data filed at 12/30/11 0200  Gross per 24 hour  Intake    485 ml  Output      0 ml  Net    485 ml    Weight change: -0.8 kg (-1 lb 12.2 oz)  Physical exam: Lungs: Clear to auscultation bilaterally. Heart: S1, S2, with a soft systolic murmur. Abdomen: Positive bowel sounds, soft, mildly tender in the hypogastrium, without distention guarding or masses palpated. Extremities: AV graft left upper extremity with thrill, no surrounding erythema. Skin: Dialysis catheter located at the right upper chest. No surrounding erythema or drainage.  Lab Results: Basic Metabolic Panel:  Basename 12/30/11 0514 12/29/11 0640 12/28/11 0500  NA 130* 133* --  K 4.8 4.4 --  CL 92* 95* --  CO2 25 28 --  GLUCOSE 161* 192* --  BUN 43* 28* --  CREATININE 6.47* 4.84* --  CALCIUM 9.1 8.6 --  MG -- -- 2.0  PHOS -- 5.9* 9.0*   Liver Function Tests:  Basename 12/28/11 0500 12/27/11 1725  AST 17 18  ALT 20 23  ALKPHOS 218* 225*  BILITOT 0.5 0.4  PROT 7.3 7.6  ALBUMIN 2.4* 2.7*    Basename 12/27/11 1725  LIPASE 13  AMYLASE --   No results found for this basename: AMMONIA:2 in the last 72 hours CBC:  Basename 12/30/11 0514 12/29/11 0640 12/27/11 1725  WBC 6.3 6.0 --  NEUTROABS -- -- 6.6  HGB 9.9* 9.4* --  HCT 30.0* 28.1* --  MCV 92.6 91.5 --  PLT 324 259 --   Cardiac Enzymes:  Basename 12/28/11 1154 12/28/11 0500 12/27/11 1725  CKTOTAL -- -- --  CKMB -- -- --  CKMBINDEX -- -- --  TROPONINI <0.30 <0.30 <0.30    BNP: No results found for this basename: PROBNP:3 in the last 72 hours D-Dimer: No results found for this basename: DDIMER:2 in the last 72 hours CBG:  Basename 12/30/11 0719 12/29/11 2041 12/29/11 1616 12/29/11 1153 12/29/11 0724 12/28/11 2101  GLUCAP 135* 278* 161* 266* 176* 168*   Hemoglobin A1C:  Basename 12/27/11 1725  HGBA1C 9.9*   Fasting Lipid Panel: No results found for this basename: CHOL,HDL,LDLCALC,TRIG,CHOLHDL,LDLDIRECT in the last 72 hours Thyroid Function Tests: No results found for this basename: TSH,T4TOTAL,FREET4,T3FREE,THYROIDAB in the last 72 hours Anemia Panel: No results found for this basename: VITAMINB12,FOLATE,FERRITIN,TIBC,IRON,RETICCTPCT in the last 72 hours Coagulation: No results found for this basename: LABPROT:2,INR:2 in the last 72 hours Urine Drug Screen: Drugs of Abuse     Component Value Date/Time   LABOPIA POSITIVE* 05/21/2009 2300   COCAINSCRNUR NONE DETECTED 05/21/2009 2300   LABBENZ NONE DETECTED 05/21/2009 2300   AMPHETMU NONE DETECTED 05/21/2009 2300   THCU NONE DETECTED 05/21/2009 2300   LABBARB  Value: NONE DETECTED        DRUG SCREEN FOR MEDICAL PURPOSES ONLY.  IF CONFIRMATION IS NEEDED FOR ANY PURPOSE, NOTIFY  LAB WITHIN 5 DAYS.        LOWEST DETECTABLE LIMITS FOR URINE DRUG SCREEN Drug Class       Cutoff (ng/mL) Amphetamine      1000 Barbiturate      200 Benzodiazepine   200 Tricyclics       300 Opiates          300 Cocaine          300 THC              50 05/21/2009 2300    Alcohol Level: No results found for this basename: ETH:2 in the last 72 hours Urinalysis:  Basename 12/27/11 1739  COLORURINE YELLOW  LABSPEC 1.020  PHURINE 8.0  GLUCOSEU 500*  HGBUR SMALL*  BILIRUBINUR NEGATIVE  KETONESUR NEGATIVE  PROTEINUR >300*  UROBILINOGEN 0.2  NITRITE NEGATIVE  LEUKOCYTESUR SMALL*   Misc. Labs:   Micro: Recent Results (from the past 240 hour(s))  CULTURE, BLOOD (ROUTINE X 2)     Status: Normal (Preliminary result)    Collection Time   12/27/11  5:25 PM      Component Value Range Status Comment   Specimen Description BLOOD RIGHT WRIST   Final    Special Requests BOTTLES DRAWN AEROBIC AND ANAEROBIC [facility]   Final    Culture  Setup Time 12/28/2011 22:12   Final    Culture     Final    Value: STAPHYLOCOCCUS AUREUS     Note: RIFAMPIN AND GENTAMICIN SHOULD NOT BE USED AS SINGLE DRUGS FOR TREATMENT OF STAPH INFECTIONS.     Note: GRAM STAIN REVIEWED-AGREE WITH RESULT Performed at [facility]   Report Status PENDING   Incomplete   URINE CULTURE     Status: Normal   Collection Time   12/27/11  5:39 PM      Component Value Range Status Comment   Specimen Description URINE, CLEAN CATCH   Final    Special Requests NONE   Final    Culture  Setup Time 12/28/2011 05:34   Final    Colony Count 35,000 COLONIES/ML   Final    Culture     Final    Value: Multiple bacterial morphotypes present, none predominant. Suggest appropriate recollection if clinically indicated.   Report Status 12/29/2011 FINAL   Final   CULTURE, BLOOD (ROUTINE X 2)     Status: Normal (Preliminary result)   Collection Time   12/27/11  5:40 PM      Component Value Range Status Comment   Specimen Description BLOOD RIGHT ARM   Final    Special Requests BOTTLES DRAWN AEROBIC ONLY [facility]   Final    Culture  Setup Time 12/28/2011 22:13   Final    Culture     Final    Value: STAPHYLOCOCCUS AUREUS     Note: SUSCEPTIBILITIES PERFORMED ON PREVIOUS CULTURE WITHIN THE LAST 5 DAYS.     Note: GRAM STAIN REVIEWED-AGREE WITH RESULT Performed at [facility]   Report Status PENDING   Incomplete   MRSA PCR SCREENING     Status: Normal   Collection Time   12/28/11  6:27 AM      Component Value Range Status Comment   MRSA by PCR NEGATIVE  NEGATIVE Final     Studies/Results: No results found.  Medications:  Scheduled:    . amLODipine  5 mg Oral Daily  . aspirin EC  81 mg Oral Daily  . b complex-vitamin c-folic acid  1 tablet Oral  Daily  .  cloNIDine  0.1 mg Transdermal Weekly  . epoetin alfa  10,000 Units Intravenous Q T,Th,Sa-HD  . famotidine  20 mg Oral BID  . FLUoxetine  20 mg Oral Daily  . gabapentin  300 mg Oral BID  . insulin aspart  0-5 Units Subcutaneous QHS  . insulin aspart  0-9 Units Subcutaneous TID WC  . insulin glargine  5 Units Subcutaneous QHS  . labetalol  300 mg Oral TID  . metoCLOPramide (REGLAN) injection  5 mg Intravenous Q8H  . senna-docusate  1 tablet Oral BID  . sevelamer  800 mg Oral TID WC  . sodium chloride  3 mL Intravenous Q12H  . vancomycin  1,250 mg Intravenous Q T,Th,Sa-HD   Continuous:  ZOX:[id number], acetaminophen, alum & mag hydroxide-simeth, fentaNYL, polyethylene glycol, traZODone  Assessment: Principal Problem:  *Abdominal pain, generalized Active Problems:  Generalized weakness  DM (diabetes mellitus), type 1 with renal complications  Hypertension  Type 1 diabetes mellitus with diabetic gastropathy  Drug-seeking behavior  End stage renal disease  Staphylococcus aureus bacteremia  Anemia of renal disease   1. Abdominal pain. According to her nephrologist, Dr. [name], the patient has chronic abdominal pain, thought to be secondary to diabetic gastropathy. CT scan of her abdomen and pelvis is unremarkable. Her lipase and liver transaminases, with exception of alkaline phosphatase, are unremarkable. She is currently on IV Reglan, but this will be transitioned to by mouth.  Recurrent staph aureus bacteremia. Vancomycin was started empirically on admission.  The patient underwent thrombectomy of her AV graft and insertion of a dialysis catheter approximately one week ago. The patient was scheduled to have the temporary hemodialysis catheter removed in [facility] tomorrow, but I have consulted general surgeon Dr. [name] to assist with removal of the catheter as it is likely the nidus of infection. He plans to remove the catheter today. She is status post treatment of  Staphylococcus aureus bacteremia in September. She completed a six-week course of vancomycin.  Chronic pain and drug-seeking behavior. This was discussed with nephrologist Dr. [name] who says that the patient has had chronic pain for years and demonstrates drug-seeking behavior at times.  Anemia, secondary to renal disease. Her was hemoglobin is 7.4. Status post 1 unit of packed red blood cells. Improvement in hemoglobin.  End-stage renal disease. Treatment and dialysis per Dr. [name].  Hyponatremia secondary to end-stage renal disease. Treatment per hemodialysis.  Hypertension. Stable. Continue Catapres and labetalol.  Type 1 diabetes mellitus, with complications. Her capillary blood glucose is generally under 200. We'll continue sliding scale NovoLog and Lantus. Will adjust dosing accordingly.  Plan:  1. Change metoclopramide  to by mouth. 2. removal of the dialysis catheter today. 3. Await the sensitivity report on the Staphylococcus aureus. 4. Discuss antibiotic regimen per phone call consult with ID when sensitivities have been resulted. 5. We'll order another 2-D echocardiogram to evaluate interval findings of the patient's valves.   LOS: 3 days   Julen Rubert 12/30/2011, 7:45 AM

## 2011-12-30 NOTE — Progress Notes (Signed)
UR Chart Review Completed  

## 2011-12-30 NOTE — Progress Notes (Signed)
*  PRELIMINARY RESULTS* Echocardiogram 2D Echocardiogram has been performed.  Conrad Rayville 12/30/2011, 2:21 PM

## 2011-12-30 NOTE — Care Management Note (Signed)
    Page 1 of 2   01/01/2012     11:05:44 AM   CARE MANAGEMENT NOTE 01/01/2012  Patient:  [name], [name]   Account Number:  [id number]  Date Initiated:  12/30/2011  Documentation initiated by:  Sharrie Rothman  Subjective/Objective Assessment:   Pt admitted from home with fever and abd pain. Pt lives with her boyfriend and will return home at discharge. Pt PCP is Dr. Olena Leatherwood in [place]. Pt is independent with ADL's. Receives dialysis t, Th, sat in [place] in [place].     Action/Plan:   WIll continue to follow for [facility] needs.   Anticipated DC Date:  12/31/2011   Anticipated DC Plan:  HOME W HOME HEALTH SERVICES      DC Planning Services  CM consult      Choice offered to / List presented to:          [facility] arranged  HH-1 RN      [facility] agency  [facility]   Status of service:  Completed, signed off Medicare Important Message given?   (If response is "NO", the following Medicare IM given date fields will be blank) Date Medicare IM given:   Date Additional Medicare IM given:    Discharge Disposition:  HOME W HOME HEALTH SERVICES  Per UR Regulation:    If discussed at Long Length of Stay Meetings, dates discussed:    Comments:  01/01/12 Rosemary Holms RN [facility] notified of late DC last night  12/31/11 Rosemary Holms RN BSN CM Pt current with [facility] and will resume with them at DC.  12/30/11 1545 Arlyss Queen, RN BSN CM

## 2011-12-30 NOTE — Progress Notes (Signed)
Inpatient Diabetes Program Recommendations  AACE/ADA: New Consensus Statement on Inpatient Glycemic Control (2013)  Target Ranges:  Prepandial:   less than 140 mg/dL      Peak postprandial:   less than 180 mg/dL (1-2 hours)      Critically ill patients:  140 - 180 mg/dL   Results for [name], [name] (MRN [id number]) as of 12/30/2011 14:27  Ref. Range 12/29/2011 07:24 12/29/2011 11:53 12/29/2011 16:16 12/29/2011 20:41 12/30/2011 07:19 12/30/2011 11:11  Glucose-Capillary Latest Range: 70-99 mg/dL 409 (H) 811 (H) 914 (H) 278 (H) 135 (H) 204 (H)    Inpatient Diabetes Program Recommendations Insulin - Meal Coverage: Please consider adding Novolog 2 or 3 units TID with meals.   Note: Will continue to follow.  Thanks, Orlando Penner, RN, BSN, CCRN Diabetes Coordinator Inpatient Diabetes Program [phone]

## 2011-12-31 DIAGNOSIS — N058 Unspecified nephritic syndrome with other morphologic changes: Secondary | ICD-10-CM

## 2011-12-31 DIAGNOSIS — R1084 Generalized abdominal pain: Secondary | ICD-10-CM

## 2011-12-31 DIAGNOSIS — N039 Chronic nephritic syndrome with unspecified morphologic changes: Secondary | ICD-10-CM

## 2011-12-31 DIAGNOSIS — R7881 Bacteremia: Secondary | ICD-10-CM

## 2011-12-31 DIAGNOSIS — E1029 Type 1 diabetes mellitus with other diabetic kidney complication: Secondary | ICD-10-CM

## 2011-12-31 DIAGNOSIS — A4901 Methicillin susceptible Staphylococcus aureus infection, unspecified site: Secondary | ICD-10-CM

## 2011-12-31 LAB — GLUCOSE, CAPILLARY: Glucose-Capillary: 272 mg/dL — ABNORMAL HIGH (ref 70–99)

## 2011-12-31 LAB — CULTURE, BLOOD (ROUTINE X 2)

## 2011-12-31 LAB — CBC
MCH: 30.3 pg (ref 26.0–34.0)
Platelets: 372 10*3/uL (ref 150–400)
RBC: 2.97 MIL/uL — ABNORMAL LOW (ref 3.87–5.11)

## 2011-12-31 LAB — BASIC METABOLIC PANEL
CO2: 23 mEq/L (ref 19–32)
Calcium: 9.1 mg/dL (ref 8.4–10.5)
GFR calc non Af Amer: 6 mL/min — ABNORMAL LOW (ref >90)
Sodium: 133 mEq/L — ABNORMAL LOW (ref 135–145)

## 2011-12-31 MED ORDER — METOCLOPRAMIDE HCL 5 MG PO TABS: 5.0000 mg | ORAL_TABLET | Freq: Two times a day (BID) | ORAL | Status: DC

## 2011-12-31 MED ORDER — GABAPENTIN 300 MG PO CAPS: 300.0000 mg | ORAL_CAPSULE | Freq: Every day | ORAL | Status: DC

## 2011-12-31 MED ORDER — FAMOTIDINE 20 MG PO TABS: 20.0000 mg | ORAL_TABLET | Freq: Every day | ORAL | Status: DC

## 2011-12-31 MED ORDER — POLYETHYLENE GLYCOL 3350 17 G PO PACK: 17.0000 g | PACK | Freq: Every day | ORAL | Status: DC | PRN

## 2011-12-31 MED ORDER — VANCOMYCIN HCL 1000 MG IV SOLR: 1250.0000 mg | INTRAVENOUS | Status: DC

## 2011-12-31 NOTE — Progress Notes (Signed)
Subjective: Interval History: has no complaint of vomiting. She is still on and off nausea. Abdominal pain has improved. She denies any difficulty breathing..  Objective: Vital signs in last 24 hours: Temp:  [97.5 F (36.4 C)-98.6 F (37 C)] 98.6 F (37 C) (11/26 0506) Pulse Rate:  [75-93] 93  (11/26 0506) Resp:  [19-20] 19  (11/26 0506) BP: (129-143)/(65-92) 132/80 mmHg (11/26 0506) SpO2:  [100 %] 100 % (11/26 0506) Weight:  [76.295 kg (168 lb 3.2 oz)] 76.295 kg (168 lb 3.2 oz) (11/26 0506) Weight change: 2.695 kg (5 lb 15.1 oz)  Intake/Output from previous day: 11/25 0701 - 11/26 0700 In: 720 [P.O.:720] Out: 550 [Urine:550] Intake/Output this shift:    General appearance: alert, cooperative and no distress Resp: clear to auscultation bilaterally Cardio: regular rate and rhythm, S1, S2 normal, no murmur, click, rub or gallop GI: soft, non-tender; bowel sounds normal; no masses,  no organomegaly Extremities: extremities normal, atraumatic, no cyanosis or edema  Lab Results:  [facility] 12/31/11 0509 12/30/11 0514  WBC 6.9 6.3  HGB 9.0* 9.9*  HCT 27.6* 30.0*  PLT 372 324   BMET:  Basename 12/31/11 0509 12/30/11 0514  NA 133* 130*  K 5.5* 4.8  CL 97 92*  CO2 23 25  GLUCOSE 115* 161*  BUN 57* 43*  CREATININE 8.04* 6.47*  CALCIUM 9.1 9.1   No results found for this basename: PTH:2 in the last 72 hours Iron Studies: No results found for this basename: IRON,TIBC,TRANSFERRIN,FERRITIN in the last 72 hours  Studies/Results: No results found.  I have reviewed the patient's current medications.  Assessment/Plan: Problem #1 renal failure her BUN is 57 creatinine is 8.04 she is due for dialysis today. Problem #2 hyperkalemia potassium 5.5 slightly high Problem #3 history of anemia her hemoglobin is 9 hematocrit 27.6 presently she is on Epogen. Problem #4 staph aureus infection patient on antibiotics she is a febrile with normal white blood cell  count. Her dialysis  catheter was taken out yesterday. Problem #5 history of diabetes Problem #6 history of nausea vomiting seems to be getting better. Problem #7 history of chronic pain syndrome Problem #8 history of hypertension. Plan: We'll do dialysis today we'll use 2K 2.5 calcium bath and will continue with her other medications as before. If patient is discharged will continue with antibiotics as outpatient.    LOS: 4 days   Sarit Sparano S 12/31/2011,8:03 AM

## 2011-12-31 NOTE — Discharge Summary (Signed)
Physician Discharge Summary  [name] ZOX:[id number] DOB: [birth year] DOA: 12/27/2011  PCP: [name], MD  Admit date: 12/27/2011 Discharge date: 12/31/2011  Time spent: Greater than 30 minutes  Recommendations for Outpatient Follow-up:  1. Blood cultures were ordered prior to discharge. The results should be followed up upon in the outpatient setting. 2. The patient will continue to be treated with vancomycin every Tuesday, Thursday, Saturday, with hemodialysis for 4-6 weeks. Ancef was preferred, but the patient is allergic to Ancef. 3. She will followup with her primary care physician as scheduled on 01/14/2012.  Discharge Diagnoses:  1. 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 2. 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 Treated with 6 weeks of vancomycin. 3. Generalized abdominal pain and nausea, thought to be secondary to bacteremia and diabetic gastropathy. 4. End-stage renal disease. 5. 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 6. Anemia of renal disease, status post 1 unit of packed red blood cell transfusion. 7. Hypertension. 8. Hyperkalemia secondary to end-stage renal disease. 9. History of drug seeking behavior.  Discharge Condition: Improved and stable.  Diet recommendation: Renal/carbohydrate modified.  Filed Weights   12/31/11 0506 12/31/11 0838 12/31/11 1305  Weight: 76.295 kg (168 lb 3.2 oz) 72.3 kg (159 lb 6.3 oz) 70 kg (154 lb 5.2 oz)    History of present illness:  The patient is a 26 year old woman with a history significant for end-stage renal disease, type 1 diabetes mellitus with diabetic gastropathy, and recently treated methicillin sensitive Staphylococcus aureus bacteremia with right-sided endocarditis, who presented to the emergency department on 12/27/2011 with a chief complaint  of fever, generalized weakness, nausea, and abdominal pain. She had recently completed a course of vancomycin on October 25th 2013 for treatment of tricuspid valve endocarditis. She was apparently admitted to [facility] last week. However, her AV graft had thrombosed. Therefore, a temporary hemodialysis catheter was inserted. She underwent thrombectomy of the graft at [facility]. In the emergency department on 12/27/2011, she was afebrile and hemodynamically stable. Her lab data were significant for glucose of 129, BUN of 37, creatinine of 7.53, and hemoglobin of 8.0. CT of her abdomen without contrast revealed no acute abnormalities. Her chest x-ray revealed stable mild cardiomegaly. She was admitted for further evaluation and management.  Hospital Course:  Although she was afebrile on admission, she presented with a history of fever at home. Therefore, blood cultures were ordered and she was started on vancomycin empirically. Dr. [name] was consulted for hemodialysis. She was restarted on sliding scale NovoLog and Lantus for treatment of her diabetes. Most of not all of her chronic medications were continued. Her pain was treated with as needed IV fentanyl and her nausea was treated with as needed Zofran. IV Reglan was started every 8 hours for suspected diabetic gastropathy. Twice a day dosing of Pepcid was also started. Clear liquids were initiated periodically during the first 24 hours of the hospitalization.  The blood cultures became positive for Staphylococcus aureus. Ultimately, the sensitivities revealed that it was once again methicillin sensitive Staphylococcus aureus. General surgeon, Dr. [name], was consulted to remove the hemodialysis catheter. This was done successfully.  During the course of the hospitalization, the patient's hemoglobin fell to 7.4. Because of her myalgias, it was felt that the patient would improve symptomatically if her hemoglobin was above 8 g.  Therefore, she was transfused a unit of packed red blood cells. Her hemoglobin improved to  9.4 and stabilized at 9.0 prior to discharge. There was no evidence of bright red blood per or black tarry stools. She was maintained on Pepcid.  I discussed ongoing treatment of recurrent methicillin sensitive Staphylococcus aureus bacteremia with infectious diseases physician Dr. Luciana Axe via phone call at [facility]. Also discussed treatment with her nephrologist Dr. [name]. We're all in agreement to discontinue vancomycin in favor of Ancef for ongoing treatment. However, later, it was discovered that she was allergic to Ancef. Therefore, she will be continued on vancomycin for 4-6 weeks for treatment. Another set of blood cultures were ordered prior to discharge.  Of note, another 2-D echocardiogram was ordered to assess for interval findings. Grossly, it it revealed no vegetations. The tricuspid valve was normal.  The patient's abdominal pain, myalgias, nausea, and vomiting completely resolved. She began to eat most of not all of her meals without pain or nausea. Her electrolyte abnormalities were addressed and treated with hemodialysis. For the most part, her capillary blood glucose averaged less than 200. She was hemodynamically stable and afebrile at the time of discharge.     Procedures: 2-D echocardiogram:Study Conclusions  - Left ventricle: The cavity size was normal. There was moderate concentric hypertrophy. Systolic function was normal. The estimated ejection fraction was in the range of 60% to 65%. Wall motion was normal; there were no regional wall motion abnormalities. - Left atrium: The atrium was mildly dilated. - Right ventricle: The cavity size was normal. Wall thickness was mildly increased. - Right atrium: The atrium was mildly dilated. - Atrial septum: No defect or patent foramen ovale was identified. - Pericardium, extracardiac: A very small, free-flowing pericardial  effusion was identified primarily posterior to the heart. There was no evidence of hemodynamic compromise. Impressions:  - Compared to the prior study performed 10/29/11, very small pericardial effusion now noted. Transthoracic echocardiography. M-mode, complete 2D, spectral Doppler, and color Doppler. Height: Height: 172.7cm. Height: 68in. Weight: Weight: 73.5kg. Weight: 161.7lb. Body mass index: BMI: 24.6kg/m^2. Body surface area: BSA: 1.26m^2. Patient status: Inpatient. Location: Bedside.   Hemodialysis.  Consultations:  Nephrologist, Dr. [name].  General surgeon, Dr. [name].  Discharge Exam: Filed Vitals:   12/31/11 1205 12/31/11 1235 12/31/11 1305 12/31/11 1503  BP: 153/94 157/95 167/97 143/81  Pulse: 87 87 87 97  Temp:   98.4 F (36.9 C) 98.2 F (36.8 C)  TempSrc:   Oral Oral  Resp: 18 18 18 18   Height:      Weight:   70 kg (154 lb 5.2 oz)   SpO2:   100% 100%    General: No acute distress. Cardiovascular: S1, S2, with a soft systolic murmur. Respiratory: Clear to auscultation bilaterally. Chest wall. Removal of dialysis catheter. Site without erythema or drainage. Nontender. Abdomen: Positive bowel sounds, soft, mildly tender in the hypogastrium, no distention, no guarding, no masses palpated.  Discharge Instructions  Discharge Orders    Future Orders Please Complete By Expires   Diet - low sodium heart healthy      Increase activity slowly          Medication List     As of 12/31/2011  4:16 PM    TAKE these medications         amLODipine 5 MG tablet   Commonly known as: NORVASC   Take 1 tablet (5 mg total) by mouth daily. For treatment of high blood pressure.      cloNIDine 0.1 mg/24hr patch   Commonly known as: CATAPRES - Dosed in  mg/24 hr   Place 1 patch (0.1 mg total) onto the skin once a week. For treatment of high blood pressure.      famotidine 20 MG tablet   Commonly known as: PEPCID   Take 1 tablet (20 mg total) by mouth daily.        FLUoxetine 20 MG capsule   Commonly known as: PROZAC   Take 20 mg by mouth daily.      gabapentin 300 MG capsule   Commonly known as: NEURONTIN   Take 1 capsule (300 mg total) by mouth daily. For treatment of nerve pain in the legs.      insulin glargine 100 UNIT/ML injection   Commonly known as: LANTUS   Inject 25 Units into the skin at bedtime.      insulin lispro 100 UNIT/ML injection   Commonly known as: HUMALOG   Inject 5-20 Units into the skin 3 (three) times daily before meals. Per sliding scale. Pt takes anywhere from 5 to 20 units.      labetalol 300 MG tablet   Commonly known as: NORMODYNE   Take 1 tablet (300 mg total) by mouth 3 (three) times daily. For treatment of high blood pressure. The dose was increased to 1 tablet 3 times daily.      metoCLOPramide 5 MG tablet   Commonly known as: REGLAN   Take 1 tablet (5 mg total) by mouth 2 (two) times daily before a meal.      multivitamin Tabs tablet   Take 1 tablet by mouth daily.      polyethylene glycol packet   Commonly known as: MIRALAX / GLYCOLAX   Take 17 g by mouth daily as needed (For constipation).      sevelamer 800 MG tablet   Commonly known as: RENVELA   Take 800 mg by mouth 3 (three) times daily with meals.      sodium chloride 0.9 % SOLN 250 mL with vancomycin 1000 MG SOLR 1,250 mg   Inject 1,250 mg into the vein Every Tuesday,Thursday,and Saturday with dialysis. FOR 4-6 WEEKS.           Follow-up Information    Follow up with HASSANI,XAJE O. On 01/14/2012. (@ 12:40)           The results of significant diagnostics from this hospitalization (including imaging, microbiology, ancillary and laboratory) are listed below for reference.    Significant Diagnostic Studies: Ct Abdomen Pelvis W Contrast  12/27/2011  *RADIOLOGY REPORT*  Clinical Data: Abdominal pain.  History of dialysis dependent end- stage renal disease.  CT ABDOMEN AND PELVIS WITH CONTRAST  Technique:  Multidetector CT imaging  of the abdomen and pelvis was performed following the standard protocol during bolus administration of intravenous contrast.  Contrast: OMNIPAQUE IOHEXOL 300 MG/ML  SOLN  Comparison: Unenhanced CT dated 10/21/2011.  Findings: The liver, spleen, gallbladder, pancreas, adrenal glands and bowel are unremarkable.  The kidneys show no evidence of obstruction.  The bladder is decompressed.  No free fluid or focal abscess identified.  There is some edema in the subcutaneous tissues.  No evidence of masses, enlarged lymph nodes or bony lesions.  No hernias are identified.  IMPRESSION: No acute abnormalities.   Original Report Authenticated By: Irish Lack, M.D.    Dg Chest Port 1 View  12/27/2011  *RADIOLOGY REPORT*  Clinical Data: Back and abdominal pain.  PORTABLE CHEST - 1 VIEW  Comparison: 12/19/2011  Findings: Right IJ hemodialysis catheter stable in position. Stable mild  cardiomegaly.  Lungs clear.  No effusion.  IMPRESSION:  1.  Stable mild cardiomegaly.   Original Report Authenticated By: D. Andria Rhein, MD     Microbiology: Recent Results (from the past 240 hour(s))  CULTURE, BLOOD (ROUTINE X 2)     Status: Normal   Collection Time   12/27/11  5:25 PM      Component Value Range Status Comment   Specimen Description BLOOD RIGHT WRIST   Final    Special Requests BOTTLES DRAWN AEROBIC AND ANAEROBIC [facility]   Final    Culture  Setup Time 12/28/2011 22:12   Final    Culture     Final    Value: STAPHYLOCOCCUS AUREUS     Note: RIFAMPIN AND GENTAMICIN SHOULD NOT BE USED AS SINGLE DRUGS FOR TREATMENT OF STAPH INFECTIONS. This organism is presumed to be Clindamycin resistant based on detection of inducible Clindamycin resistance.     Note: Performed at [facility] Gram Stain Report Called to,Read Back By and Verified With: Derrick Ravel @ 1506PM 12/28/11 BY PRUITT C   Report Status 12/31/2011 FINAL   Final    Organism ID, Bacteria STAPHYLOCOCCUS AUREUS   Final   URINE CULTURE     Status: Normal    Collection Time   12/27/11  5:39 PM      Component Value Range Status Comment   Specimen Description URINE, CLEAN CATCH   Final    Special Requests NONE   Final    Culture  Setup Time 12/28/2011 05:34   Final    Colony Count 35,000 COLONIES/ML   Final    Culture     Final    Value: Multiple bacterial morphotypes present, none predominant. Suggest appropriate recollection if clinically indicated.   Report Status 12/29/2011 FINAL   Final   CULTURE, BLOOD (ROUTINE X 2)     Status: Normal   Collection Time   12/27/11  5:40 PM      Component Value Range Status Comment   Specimen Description BLOOD RIGHT ARM   Final    Special Requests BOTTLES DRAWN AEROBIC ONLY [facility]   Final    Culture  Setup Time 12/28/2011 22:13   Final    Culture     Final    Value: STAPHYLOCOCCUS AUREUS     Note: SUSCEPTIBILITIES PERFORMED ON PREVIOUS CULTURE WITHIN THE LAST 5 DAYS.     Gram Stain Report Called to,Read Back By and Verified With: BUCKNER,J. AT 1506 ON 12/28/2011 BY PRUITT,C.     NOTE GRAM STAIN REVIEWED-AGREE WITH RESULT Performed at [facility]   Report Status 12/30/2011 FINAL   Final   MRSA PCR SCREENING     Status: Normal   Collection Time   12/28/11  6:27 AM      Component Value Range Status Comment   MRSA by PCR NEGATIVE  NEGATIVE Final   CATH TIP CULTURE     Status: Normal (Preliminary result)   Collection Time   12/30/11  1:18 PM      Component Value Range Status Comment   Specimen Description CATH TIP PORTA CATH   Final    Special Requests NONE   Final    Culture NO GROWTH 1 DAY   Final    Report Status PENDING   Incomplete      Labs: Basic Metabolic Panel:  Lab 12/31/11 1610 12/30/11 0514 12/29/11 0640 12/28/11 0500 12/27/11 1725  NA 133* 130* 133* 132* 135  K 5.5* 4.8 4.4 4.6 4.2  CL 97 92* 95* 95* 97  CO2 23 25 28 22 23   GLUCOSE 115* 161* 192* 78 129*  BUN 57* 43* 28* 40* 37*  CREATININE 8.04* 6.47* 4.84* 8.00* 7.53*  CALCIUM 9.1 9.1 8.6 8.7 9.1  MG -- -- -- 2.0 --   PHOS -- -- 5.9* 9.0* --   Liver Function Tests:  Lab 12/28/11 0500 12/27/11 1725  AST 17 18  ALT 20 23  ALKPHOS 218* 225*  BILITOT 0.5 0.4  PROT 7.3 7.6  ALBUMIN 2.4* 2.7*    Lab 12/27/11 1725  LIPASE 13  AMYLASE --   No results found for this basename: AMMONIA:5 in the last 168 hours CBC:  Lab 12/31/11 0509 12/30/11 0514 12/29/11 0640 12/28/11 0500 12/27/11 1725  WBC 6.9 6.3 6.0 6.0 8.4  NEUTROABS -- -- -- -- 6.6  HGB 9.0* 9.9* 9.4* 7.4* 8.0*  HCT 27.6* 30.0* 28.1* 22.9* 24.2*  MCV 92.9 92.6 91.5 92.7 92.4  PLT 372 324 259 209 269   Cardiac Enzymes:  Lab 12/28/11 1154 12/28/11 0500 12/27/11 1725  CKTOTAL -- -- --  CKMB -- -- --  CKMBINDEX -- -- --  TROPONINI <0.30 <0.30 <0.30   BNP: BNP (last 3 results) No results found for this basename: PROBNP:3 in the last 8760 hours CBG:  Lab 12/31/11 1346 12/31/11 0742 12/30/11 2023 12/30/11 1659 12/30/11 1111  GLUCAP 129* 161* 279* 162* 204*       Signed:  Wateen Varon  [facility] 12/31/2011, 4:16 PM

## 2011-12-31 NOTE — Progress Notes (Signed)
INitiated dialysis via LUS AVG 15gax1" up/up without difficulty.  Outpatient plan of care requested from [facility]

## 2011-12-31 NOTE — Progress Notes (Signed)
ANTIBIOTIC CONSULT NOTE   Pharmacy Consult for Vancomycin Indication: Recent Bacteremia   Allergies  Allergen Reactions  . Ancef (Cefazolin Sodium) Shortness Of Breath    wheezing  . Coumadin (Warfarin Sodium)     Eyes bleed  . Morphine And Related Itching  . Sulfa Antibiotics Anaphylaxis and Shortness Of Breath  . Tomato Hives and Swelling   Patient Measurements: Height: 5\' 8"  (172.7 cm) Weight: 168 lb 3.2 oz (76.295 kg) IBW/kg (Calculated) : 63.9   Vital Signs: Temp: 98.6 F (37 C) (11/26 0506) Temp src: Oral (11/26 0506) BP: 132/80 mmHg (11/26 0506) Pulse Rate: 93  (11/26 0506) Intake/Output from previous day: 11/25 0701 - 11/26 0700 In: 720 [P.O.:720] Out: 550 [Urine:550] Intake/Output from this shift:    Labs:  Basename 12/31/11 0509 12/30/11 0514 12/29/11 0640  WBC 6.9 6.3 6.0  HGB 9.0* 9.9* 9.4*  PLT 372 324 259  LABCREA -- -- --  CREATININE 8.04* 6.47* 4.84*   Estimated Creatinine Clearance: 10.8 ml/min (by C-G formula based on Cr of 8.04).  Basename 12/31/11 0509  VANCOTROUGH --  VANCOPEAK --  VANCORANDOM 20.2  GENTTROUGH --  GENTPEAK --  GENTRANDOM --  TOBRATROUGH --  TOBRAPEAK --  TOBRARND --  AMIKACINPEAK --  AMIKACINTROU --  AMIKACIN --     Microbiology: Recent Results (from the past 720 hour(s))  CULTURE, BLOOD (ROUTINE X 2)     Status: Normal   Collection Time   12/27/11  5:25 PM      Component Value Range Status Comment   Specimen Description BLOOD RIGHT WRIST   Final    Special Requests BOTTLES DRAWN AEROBIC AND ANAEROBIC [facility]   Final    Culture  Setup Time 12/28/2011 22:12   Final    Culture     Final    Value: STAPHYLOCOCCUS AUREUS     Note: RIFAMPIN AND GENTAMICIN SHOULD NOT BE USED AS SINGLE DRUGS FOR TREATMENT OF STAPH INFECTIONS. This organism is presumed to be Clindamycin resistant based on detection of inducible Clindamycin resistance.     Gram Stain Report Called to,Read Back By and Verified With: BUCKNER,J. AT 1506 ON  12/28/2011 BY PRUIT,C.     NOTE GRAM STAIN REVIEWED-AGREE WITH RESULT Performed at [facility]   Report Status 12/30/2011 FINAL   Final    Organism ID, Bacteria STAPHYLOCOCCUS AUREUS   Final   URINE CULTURE     Status: Normal   Collection Time   12/27/11  5:39 PM      Component Value Range Status Comment   Specimen Description URINE, CLEAN CATCH   Final    Special Requests NONE   Final    Culture  Setup Time 12/28/2011 05:34   Final    Colony Count 35,000 COLONIES/ML   Final    Culture     Final    Value: Multiple bacterial morphotypes present, none predominant. Suggest appropriate recollection if clinically indicated.   Report Status 12/29/2011 FINAL   Final   CULTURE, BLOOD (ROUTINE X 2)     Status: Normal   Collection Time   12/27/11  5:40 PM      Component Value Range Status Comment   Specimen Description BLOOD RIGHT ARM   Final    Special Requests BOTTLES DRAWN AEROBIC ONLY [facility]   Final    Culture  Setup Time 12/28/2011 22:13   Final    Culture     Final    Value: STAPHYLOCOCCUS AUREUS     Note:  SUSCEPTIBILITIES PERFORMED ON PREVIOUS CULTURE WITHIN THE LAST 5 DAYS.     Gram Stain Report Called to,Read Back By and Verified With: BUCKNER,J. AT 1506 ON 12/28/2011 BY PRUITT,C.     NOTE GRAM STAIN REVIEWED-AGREE WITH RESULT Performed at [facility]   Report Status 12/30/2011 FINAL   Final   MRSA PCR SCREENING     Status: Normal   Collection Time   12/28/11  6:27 AM      Component Value Range Status Comment   MRSA by PCR NEGATIVE  NEGATIVE Final   CATH TIP CULTURE     Status: Normal (Preliminary result)   Collection Time   12/30/11  1:18 PM      Component Value Range Status Comment   Specimen Description CATH TIP PORTA CATH   Final    Special Requests NONE   Final    Culture NO GROWTH 1 DAY   Final    Report Status PENDING   Incomplete     Medical History: Past Medical History  Diagnosis Date  . Diabetes mellitus     Insulin dependent diabetes mellitus  with history of poor control  . Hypertension   . Hyperlipidemia   . Nephrotic syndrome   . Anemia     Chronic,  Dr. Mariel Sleet,  Transfused, May, 2012  . DVT (deep venous thrombosis)     Bilateral . coumadin stopped b/c of bilateral retinal hemorrhages.  . Hypertrophic cardiomyopathy     Diastolic dysfunction  . Legally blind     Bilateral hemorrhagic retinal detachments  . Vaginosis   . History of MRSA infection     Multiple  . Glaucoma(365)   . Dialysis patient     M-W-F  . Acute pyelonephritis   . Medical non-compliance   . Manic depressive disorder   . 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 Anemia of renal disease   . Seizures   . 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 Drug-seeking behavior 12/28/2011    Medications:  Scheduled:     . amLODipine  5 mg Oral Daily  . aspirin EC  81 mg Oral Daily  . b complex-vitamin c-folic acid  1 tablet Oral Daily  . cloNIDine  0.1 mg Transdermal Weekly  . epoetin alfa  10,000 Units Intravenous Q T,Th,Sa-HD  . famotidine  20 mg Oral BID  . FLUoxetine  20 mg Oral Daily  . gabapentin  300 mg Oral BID  . insulin aspart  0-5 Units Subcutaneous QHS  . insulin aspart  0-9 Units Subcutaneous TID WC  . insulin glargine  5 Units Subcutaneous QHS  . labetalol  300 mg Oral TID  . metoCLOPramide  5 mg Oral TID AC  . senna-docusate  1 tablet Oral BID  . sevelamer  800 mg Oral TID WC  . sodium chloride  3 mL Intravenous Q12H  . vancomycin  1,250 mg Intravenous Q T,Th,Sa-HD   Assessment: Patient treated with IV Vancomycin in September 2013 at [facility].  Pre-HD level slightly below goal with dose of 1gm IV every HD at that time.  Now on Vancomycin 1250mg  iv with each dialysis and pre-dialysis level is on target.  Goal of Therapy:  Pre-Hemodialysis Vancomycin level goal range =15-25 mcg/ml  Plan:  Continue Vancomycin 1250mg  IV every HD. Measure antibiotic drug levels at steady state Follow up  culture results Duration of therapy per MD  Valrie Hart A 12/31/2011,7:54 AM

## 2012-01-01 ENCOUNTER — Encounter (HOSPITAL_COMMUNITY): Payer: Self-pay | Admitting: General Surgery

## 2012-01-01 LAB — TYPE AND SCREEN: Unit division: 0

## 2012-01-02 LAB — CATH TIP CULTURE: Culture: NO GROWTH

## 2012-01-03 ENCOUNTER — Emergency Department (HOSPITAL_COMMUNITY): Payer: Medicaid Other

## 2012-01-03 ENCOUNTER — Encounter (HOSPITAL_COMMUNITY): Payer: Self-pay | Admitting: Emergency Medicine

## 2012-01-03 ENCOUNTER — Emergency Department (HOSPITAL_COMMUNITY)
Admission: EM | Admit: 2012-01-03 | Discharge: 2012-01-04 | Disposition: A | Discharge status: Home / Self Care | Payer: Medicaid Other | Attending: Emergency Medicine | Admitting: Emergency Medicine

## 2012-01-03 DIAGNOSIS — Z79899 Other long term (current) drug therapy: Secondary | ICD-10-CM | POA: Insufficient documentation

## 2012-01-03 DIAGNOSIS — N186 End stage renal disease: Secondary | ICD-10-CM | POA: Insufficient documentation

## 2012-01-03 DIAGNOSIS — Z8669 Personal history of other diseases of the nervous system and sense organs: Secondary | ICD-10-CM | POA: Insufficient documentation

## 2012-01-03 DIAGNOSIS — I120 Hypertensive chronic kidney disease with stage 5 chronic kidney disease or end stage renal disease: Secondary | ICD-10-CM | POA: Insufficient documentation

## 2012-01-03 DIAGNOSIS — R079 Chest pain, unspecified: Secondary | ICD-10-CM

## 2012-01-03 DIAGNOSIS — R109 Unspecified abdominal pain: Secondary | ICD-10-CM | POA: Insufficient documentation

## 2012-01-03 DIAGNOSIS — I422 Other hypertrophic cardiomyopathy: Secondary | ICD-10-CM | POA: Insufficient documentation

## 2012-01-03 DIAGNOSIS — R0789 Other chest pain: Secondary | ICD-10-CM | POA: Insufficient documentation

## 2012-01-03 DIAGNOSIS — E785 Hyperlipidemia, unspecified: Secondary | ICD-10-CM | POA: Insufficient documentation

## 2012-01-03 DIAGNOSIS — Z8742 Personal history of other diseases of the female genital tract: Secondary | ICD-10-CM | POA: Insufficient documentation

## 2012-01-03 DIAGNOSIS — Z9119 Patient's noncompliance with other medical treatment and regimen: Secondary | ICD-10-CM | POA: Insufficient documentation

## 2012-01-03 DIAGNOSIS — D649 Anemia, unspecified: Secondary | ICD-10-CM | POA: Insufficient documentation

## 2012-01-03 DIAGNOSIS — R739 Hyperglycemia, unspecified: Secondary | ICD-10-CM

## 2012-01-03 DIAGNOSIS — Z8679 Personal history of other diseases of the circulatory system: Secondary | ICD-10-CM | POA: Insufficient documentation

## 2012-01-03 DIAGNOSIS — R110 Nausea: Secondary | ICD-10-CM | POA: Insufficient documentation

## 2012-01-03 DIAGNOSIS — Z91199 Patient's noncompliance with other medical treatment and regimen due to unspecified reason: Secondary | ICD-10-CM | POA: Insufficient documentation

## 2012-01-03 DIAGNOSIS — E1049 Type 1 diabetes mellitus with other diabetic neurological complication: Secondary | ICD-10-CM | POA: Insufficient documentation

## 2012-01-03 DIAGNOSIS — Z8614 Personal history of Methicillin resistant Staphylococcus aureus infection: Secondary | ICD-10-CM | POA: Insufficient documentation

## 2012-01-03 DIAGNOSIS — E1069 Type 1 diabetes mellitus with other specified complication: Secondary | ICD-10-CM | POA: Insufficient documentation

## 2012-01-03 DIAGNOSIS — Z86718 Personal history of other venous thrombosis and embolism: Secondary | ICD-10-CM | POA: Insufficient documentation

## 2012-01-03 DIAGNOSIS — K319 Disease of stomach and duodenum, unspecified: Secondary | ICD-10-CM | POA: Insufficient documentation

## 2012-01-03 DIAGNOSIS — Z794 Long term (current) use of insulin: Secondary | ICD-10-CM | POA: Insufficient documentation

## 2012-01-03 DIAGNOSIS — Z87441 Personal history of nephrotic syndrome: Secondary | ICD-10-CM | POA: Insufficient documentation

## 2012-01-03 DIAGNOSIS — R197 Diarrhea, unspecified: Secondary | ICD-10-CM | POA: Insufficient documentation

## 2012-01-03 LAB — CBC WITH DIFFERENTIAL/PLATELET
Basophils Absolute: 0 10*3/uL (ref 0.0–0.1)
Basophils Relative: 0 % (ref 0–1)
Eosinophils Absolute: 0.3 10*3/uL (ref 0.0–0.7)
Eosinophils Relative: 4 % (ref 0–5)
HCT: 28 % — ABNORMAL LOW (ref 36.0–46.0)
Hemoglobin: 9 g/dL — ABNORMAL LOW (ref 12.0–15.0)
MCH: 30.4 pg (ref 26.0–34.0)
MCHC: 32.1 g/dL (ref 30.0–36.0)
Monocytes Absolute: 0.5 10*3/uL (ref 0.1–1.0)
Monocytes Relative: 6 % (ref 3–12)
Neutro Abs: 5.7 10*3/uL (ref 1.7–7.7)
RDW: 15.8 % — ABNORMAL HIGH (ref 11.5–15.5)

## 2012-01-03 LAB — BASIC METABOLIC PANEL
BUN: 52 mg/dL — ABNORMAL HIGH (ref 6–23)
Calcium: 8.6 mg/dL (ref 8.4–10.5)
Creatinine, Ser: 6.96 mg/dL — ABNORMAL HIGH (ref 0.50–1.10)
GFR calc Af Amer: 9 mL/min — ABNORMAL LOW (ref >90)
GFR calc non Af Amer: 7 mL/min — ABNORMAL LOW (ref >90)

## 2012-01-03 LAB — TROPONIN I: Troponin I: <0.3 ng/mL (ref <0.30)

## 2012-01-03 MED ORDER — INSULIN ASPART 100 UNIT/ML ~~LOC~~ SOLN
15.0000 [IU] | Freq: Once | SUBCUTANEOUS | Status: AC
  Administered 2012-01-03: 15 [IU] via SUBCUTANEOUS

## 2012-01-03 MED ORDER — ONDANSETRON 8 MG PO TBDP
8.0000 mg | ORAL_TABLET | Freq: Once | ORAL | Status: AC
  Administered 2012-01-03: 8 mg via ORAL
  Filled 2012-01-03: qty 1

## 2012-01-03 NOTE — ED Provider Notes (Signed)
History   This chart was scribed for Flint Melter, MD by Melba Coon, ED Scribe. The patient was seen in room APA09/APA09 and the patient's care was started at 9:55PM.    CSN: [id number]  Arrival date & time 01/03/12  2100   First MD Initiated Contact with Patient 01/03/12 2147      Chief Complaint  Patient presents with  . Hyperglycemia  . Chest Pain    (Consider location/radiation/quality/duration/timing/severity/associated sxs/prior treatment) The history is provided by the patient. No language interpreter was used.   [name] is a 26 y.o. female who presents to the Emergency Department complaining of constant, moderate to severe shooting chest pain with an onset today. She reports feeling hot all day today. She did not eat anything the day before and reports decreased appetite today; she had to force herself to eat today. Deep inhalation aggravates the pain. She is a dialysis patient; last dialysis was yesterday. She reports abdominal pain, nausea, diarrhea, and decreased urinary frequency. She denies vomit and dysuria. She has a Hx diabetes and takes lantus and humalog; last insulin dose was around noon today. No other pertinent medical symptoms.   Past Medical History  Diagnosis Date  . 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 Hypertension   . Hyperlipidemia   . Nephrotic syndrome   . Anemia     Chronic,  Dr. Mariel Sleet,  Transfused, May, 2012  . DVT (deep venous thrombosis)     Bilateral . coumadin stopped b/c of bilateral retinal hemorrhages.  . Hypertrophic cardiomyopathy     Diastolic dysfunction  . Legally blind     Bilateral hemorrhagic retinal detachments  . Vaginosis   . History of MRSA infection     Multiple  . Glaucoma(365)   . Dialysis patient     M-W-F  . Acute pyelonephritis   . Medical non-compliance   . Manic depressive disorder   . 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  Anemia of renal disease   . Seizures   . 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 Drug-seeking behavior 12/28/2011    Past Surgical History  Procedure Date  . Left arm cyst removal   . Incise and drain abcess 04/24/11    Multiple MRSA - March 2013 drain port of abcess  . Placement of port a cath     Poor venous access  . Av fistula placement   . Hematoma evacuation 05/25/2011    Procedure: EVACUATION HEMATOMA;  Surgeon: Sherren Kerns, MD;  Location: [facility];  Service: Vascular;  Laterality: Right;  Evacuation Hematoma Right Arm  . Port-a-cath removal 07/04/2011    Procedure: MINOR REMOVAL PORT-A-CATH;  Surgeon: Dalia Heading, MD;  Location: [facility] ORS;  Service: General;  Laterality: Left;  Removal Port-A-Cath/Removal Dialysis Catheter in Minor Room  . Tee without cardioversion 10/29/2011    Procedure: TRANSESOPHAGEAL ECHOCARDIOGRAM (TEE);  Surgeon: Kathlen Brunswick, MD;  Location: [facility] ENDO SUITE;  Service: Cardiovascular;  Laterality: N/A;  . Thrombectomy and revision of arterioventous (av) goretex  graft   . Port-a-cath removal 12/30/2011    Procedure: MINOR REMOVAL PORT-A-CATH;  Surgeon: Fabio Bering, MD;  Location: [facility] ORS;  Service: General;  Laterality: N/A;    Family History  Problem Relation Age of Onset  . Diabetes Mother   . Hypertension Mother   . Diabetes Father     History  Substance Use Topics  . Smoking status: Never Smoker   . Smokeless tobacco: Not on file  . Alcohol Use: No    OB History    Grav Para Term Preterm Abortions TAB SAB Ect Mult Living                  Review of Systems  Cardiovascular: Positive for chest pain.   10 Systems reviewed and all are negative for acute change except as noted in the HPI.   Allergies  Ancef; Coumadin; Morphine and related; Sulfa antibiotics; and Tomato  Home Medications   Current Outpatient Rx  Name  Route  Sig  Dispense  Refill  . AMLODIPINE BESYLATE 5 MG PO TABS   Oral   Take 1  tablet (5 mg total) by mouth daily. For treatment of high blood pressure.   30 tablet   3   . CLONIDINE HCL 0.1 MG/24HR TD PTWK   Transdermal   Place 1 patch (0.1 mg total) onto the skin once a week. For treatment of high blood pressure.   4 patch   3   . FLUOXETINE HCL 20 MG PO CAPS   Oral   Take 20 mg by mouth daily.         Marland Kitchen GABAPENTIN 300 MG PO CAPS   Oral   Take 1 capsule (300 mg total) by mouth daily. For treatment of nerve pain in the legs.         . INSULIN GLARGINE 100 UNIT/ML [place] SOLN   Subcutaneous   Inject 25 Units into the skin at bedtime.         . INSULIN LISPRO (HUMAN) 100 UNIT/ML [place] SOLN   Subcutaneous   Inject 5-20 Units into the skin 3 (three) times daily before meals. Per sliding scale. Pt takes anywhere from 5 to 20 units.         Marland Kitchen LABETALOL HCL 300 MG PO TABS   Oral   Take 1 tablet (300 mg total) by mouth 3 (three) times daily. For treatment of high blood pressure. The dose was increased to 1 tablet 3 times daily.   90 tablet   3   . RENA-VITE PO TABS   Oral   Take 1 tablet by mouth daily.         Marland Kitchen SEVELAMER CARBONATE 800 MG PO TABS   Oral   Take 800 mg by mouth 3 (three) times daily with meals.         Marland Kitchen VANCOMYCIN 1000 MG IVPB (HEMODIALYSIS)   Intravenous   Inject 1,250 mg into the vein Every Tuesday,Thursday,and Saturday with dialysis. FOR 4-6 WEEKS.         Marland Kitchen FAMOTIDINE 20 MG PO TABS   Oral   Take 1 tablet (20 mg total) by mouth daily.   30 tablet   6   . METOCLOPRAMIDE HCL 5 MG PO TABS   Oral   Take 1 tablet (5 mg total) by mouth 2 (two) times daily before a meal.   60 tablet   0   . ONDANSETRON HCL 8 MG PO TABS   Oral   Take 1 tablet (8 mg total) by mouth every 8 (eight) hours as needed for nausea.   20 tablet   0   . OXYCODONE-ACETAMINOPHEN 5-325 MG PO TABS   Oral   Take 1 tablet by mouth every 4 (four) hours as needed for pain.   15 tablet   0   . POLYETHYLENE GLYCOL  3350 PO PACK   Oral   Take 17 g by  mouth daily as needed (For constipation).   14 each        BP 183/99  Pulse 111  Temp 99.5 F (37.5 C) (Oral)  Resp 20  Ht 5\' 8"  (1.727 m)  Wt 150 lb (68.04 kg)  BMI 22.81 kg/m2  SpO2 99%  LMP 12/13/2011  Physical Exam  Nursing note and vitals reviewed. Constitutional:       Awake, alert, nontoxic appearance.  HENT:  Head: Atraumatic.  Eyes: Right eye exhibits no discharge. Left eye exhibits no discharge.  Neck: Neck supple.  Pulmonary/Chest: Effort normal. She exhibits no tenderness.  Abdominal: Soft. There is tenderness (RUQ and LUQ tenderness). There is no rebound.  Musculoskeletal: She exhibits no tenderness.       Baseline ROM, no obvious new focal weakness.  Neurological:       Mental status and motor strength appears baseline for patient and situation.  Skin: No rash noted.       Left upper arm graft for dialysis with good thrill.  Psychiatric: She has a normal mood and affect.    ED Course  Procedures (including critical care time)  DIAGNOSTIC STUDIES: Oxygen Saturation is 97% on room air, normal by my interpretation.    COORDINATION OF CARE:  9:58PM - novolog, zofran, CXR, UA, blood w/u, and EKG will be ordered for Ms Cozza.    Date: 11/22/2011  Rate: 112  Rhythm: sinus tachycardia  QRS Axis: normal  PR and QT Intervals: normal  ST/T Wave abnormalities: normal  PR and QRS Conduction Disutrbances:none  Narrative Interpretation:   Old EKG Reviewed: unchanged   Reevaluation: 00:25- she is alert and calm. She has tolerated oral fluids, without vomiting. She voided 1000 cc.   Labs Reviewed  CBC WITH DIFFERENTIAL - Abnormal; Notable for the following:    RBC 2.96 (*)     Hemoglobin 9.0 (*)     HCT 28.0 (*)     RDW 15.8 (*)     Platelets 455 (*)     All other components within normal limits  BASIC METABOLIC PANEL - Abnormal; Notable for the following:    Sodium 128 (*)     Chloride 90 (*)     Glucose, Bld 497 (*)     BUN 52 (*)      Creatinine, Ser 6.96 (*)     GFR calc non Af Amer 7 (*)     GFR calc Af Amer 9 (*)     All other components within normal limits  D-DIMER, QUANTITATIVE - Abnormal; Notable for the following:    D-Dimer, Quant 2.48 (*)     All other components within normal limits  GLUCOSE, CAPILLARY - Abnormal; Notable for the following:    Glucose-Capillary 453 (*)     All other components within normal limits  URINALYSIS, ROUTINE W REFLEX MICROSCOPIC - Abnormal; Notable for the following:    Glucose, UA >1000 (*)     Hgb urine dipstick SMALL (*)     Protein, ur 100 (*)     All other components within normal limits  URINE MICROSCOPIC-ADD ON - Abnormal; Notable for the following:    Squamous Epithelial / LPF FEW (*)     All other components within normal limits  TROPONIN I  URINE CULTURE   Dg Chest Portable 1 View  01/03/2012  *RADIOLOGY REPORT*  Clinical Data: Chest pain, hyperglycemia and history of end-stage renal disease.  PORTABLE CHEST - 1 VIEW  Comparison: 12/27/2011  Findings: Interval removal of right-sided dialysis catheter.  Lung volumes are low and mild interstitial edema is suspected.  No pleural effusions are identified.  Stable cardiomegaly.  IMPRESSION: Suspect mild interstitial edema.   Original Report Authenticated By: Irish Lack, M.D.      1. Chest pain, unspecified   2. Hyperglycemia       MDM  Chest pain, with associated nonspecific symptoms. Doubt ACS, PE, pneumonia, or significant metabolic instability. Patient had d-dimer drawn, and ordered by nursing protocol. The d-dimer is elevated. The patient has had elevated d-dimer is frequently in the past, been evaluated for thromboembolic disorders, and none were found. I would not have ordered a d-dimer, had it seen her when the initial labs were done. It is very unlikely that she has a PE tonight. Her fluid status, is likely more wet, than dry. She's due for dialysis, in the morning. There is no clear cause of infection,  causing hyperglycemia. A urine culture has been ordered. She will be discharged, with symptomatic treatment.  I personally performed the services described in this documentation, which was scribed in my presence. The recorded information has been reviewed and is accurate.     Plan: Home Medications- usual plus Percocet and Zofran; Home Treatments- oral fluids; Recommended follow up- Dialysis tomorrow.       Flint Melter, MD 01/04/12 [phone]

## 2012-01-03 NOTE — ED Notes (Signed)
Attempted for IV access without success, another nurse to assess

## 2012-01-03 NOTE — ED Notes (Signed)
Second nurse unable to obtain IV access.  °

## 2012-01-03 NOTE — ED Notes (Signed)
Pt states she is taking small sips of water because it is making her feel a little sick.

## 2012-01-03 NOTE — ED Notes (Signed)
Patient c/o shooting chest pain that started earlier; states it hurts to breathe.  Patient is a dialysis patient; last dialysis was yesterday.

## 2012-01-04 LAB — GLUCOSE, CAPILLARY
Glucose-Capillary: 35 mg/dL — CL (ref 70–99)
Glucose-Capillary: 60 mg/dL — ABNORMAL LOW (ref 70–99)
Glucose-Capillary: 125 mg/dL — ABNORMAL HIGH (ref 70–99)

## 2012-01-04 LAB — URINE MICROSCOPIC-ADD ON

## 2012-01-04 LAB — CULTURE, BLOOD (ROUTINE X 2)

## 2012-01-04 LAB — URINALYSIS, ROUTINE W REFLEX MICROSCOPIC
Bilirubin Urine: NEGATIVE
Glucose, UA: >1000 mg/dL — AB
Ketones, ur: NEGATIVE mg/dL
Leukocytes, UA: NEGATIVE
Protein, ur: 100 mg/dL — AB
pH: 8 (ref 5.0–8.0)

## 2012-01-04 MED ORDER — ONDANSETRON HCL 8 MG PO TABS: 8.0000 mg | ORAL_TABLET | Freq: Three times a day (TID) | ORAL | Status: DC | PRN

## 2012-01-04 MED ORDER — GLUCOSE 40 % PO GEL
ORAL | Status: AC
  Administered 2012-01-04: 02:00:00
  Filled 2012-01-04: qty 0.83

## 2012-01-04 MED ORDER — OXYCODONE-ACETAMINOPHEN 5-325 MG PO TABS: 1.0000 | ORAL_TABLET | ORAL | Status: DC | PRN

## 2012-01-04 NOTE — ED Notes (Signed)
Pt had another BM on self. Cleaned pt and gave new linens. Brief placed on patient and paper scrubs per pt request. Will order breakfast tray, ride is on their way.

## 2012-01-04 NOTE — ED Notes (Signed)
Pt eating more peanut crackers at this time. Also, meal being heated up for pt.

## 2012-01-04 NOTE — ED Notes (Signed)
Pt trying to call for a ride home.

## 2012-01-04 NOTE — ED Notes (Signed)
Pt finished breakfast, stated feeling better, ride arrived.  Female RN assisting pt with dressing and escorted to car.

## 2012-01-04 NOTE — ED Notes (Signed)
Discharge instructions reviewed with pt, questions answered. Pt verbalized understanding.  

## 2012-01-04 NOTE — ED Notes (Signed)
Tech, [name] feeding pt due to her being legally blind. Blood sugar is 125

## 2012-01-04 NOTE — ED Notes (Signed)
Attempted again after previous nurses attempts at getting an IV and was unsuccessful. EDP aware

## 2012-01-04 NOTE — ED Notes (Signed)
Tech heard pt hollering out from her room, that her blood sugar was dropping. I went into pts room and found her diaphoretic and responsive with a blood sugar of 35. Pt given 4 small cups of orange juice, and oral glucose. Blood sugar rechecked and it was 48. Pt then given 4 peanut butter crackers and water. Pts blood sugar is now 60. Pt requesting a "few" more crackers. Pt given more peanut butter crackers.

## 2012-01-04 NOTE — ED Notes (Signed)
EDP in with pt, came out of room and states pt is going to be discharged and is calling her ride now.

## 2012-01-04 NOTE — ED Notes (Signed)
Pt c/o intermittent, sharp chest pain. EDP aware and no orders received. EDP did ask to make sure pt was drinking her water.

## 2012-01-04 NOTE — ED Notes (Addendum)
Went in to check on pt, pt diaphoretic. Removed 1 of the 2 warm blankets and gave her a cool wash cloth. Pt then began to say she thought she was going to have an episode of diarrhea. Pt had a loose stool, was cleaned, and linen and gown were changed as well. Went to check on another pt and Fannie Knee, [place] stated that pt had another loose stool. She then cleaned pt again. Tried as best as we could to obtain a stool sample.

## 2012-01-04 NOTE — ED Notes (Signed)
Pt given breakfast tray.  Assisted her with setup.  Call bell in reach.

## 2012-01-05 LAB — CULTURE, BLOOD (ROUTINE X 2): Culture: NO GROWTH

## 2012-01-05 LAB — URINE CULTURE: Colony Count: 35000

## 2012-01-11 ENCOUNTER — Emergency Department (HOSPITAL_COMMUNITY)
Admission: EM | Admit: 2012-01-11 | Discharge: 2012-01-11 | Disposition: A | Discharge status: Home / Self Care | Payer: Medicaid Other | Attending: Emergency Medicine | Admitting: Emergency Medicine

## 2012-01-11 ENCOUNTER — Encounter (HOSPITAL_COMMUNITY): Payer: Self-pay | Admitting: *Deleted

## 2012-01-11 DIAGNOSIS — Z8679 Personal history of other diseases of the circulatory system: Secondary | ICD-10-CM | POA: Insufficient documentation

## 2012-01-11 DIAGNOSIS — Z3202 Encounter for pregnancy test, result negative: Secondary | ICD-10-CM | POA: Insufficient documentation

## 2012-01-11 DIAGNOSIS — Z9889 Other specified postprocedural states: Secondary | ICD-10-CM | POA: Insufficient documentation

## 2012-01-11 DIAGNOSIS — N049 Nephrotic syndrome with unspecified morphologic changes: Secondary | ICD-10-CM | POA: Insufficient documentation

## 2012-01-11 DIAGNOSIS — Z8614 Personal history of Methicillin resistant Staphylococcus aureus infection: Secondary | ICD-10-CM | POA: Insufficient documentation

## 2012-01-11 DIAGNOSIS — I10 Essential (primary) hypertension: Secondary | ICD-10-CM | POA: Insufficient documentation

## 2012-01-11 DIAGNOSIS — G40802 Other epilepsy, not intractable, without status epilepticus: Secondary | ICD-10-CM | POA: Insufficient documentation

## 2012-01-11 DIAGNOSIS — F319 Bipolar disorder, unspecified: Secondary | ICD-10-CM | POA: Insufficient documentation

## 2012-01-11 DIAGNOSIS — Z992 Dependence on renal dialysis: Secondary | ICD-10-CM | POA: Insufficient documentation

## 2012-01-11 DIAGNOSIS — R300 Dysuria: Secondary | ICD-10-CM | POA: Insufficient documentation

## 2012-01-11 DIAGNOSIS — N390 Urinary tract infection, site not specified: Secondary | ICD-10-CM

## 2012-01-11 DIAGNOSIS — R110 Nausea: Secondary | ICD-10-CM | POA: Insufficient documentation

## 2012-01-11 DIAGNOSIS — Z86718 Personal history of other venous thrombosis and embolism: Secondary | ICD-10-CM | POA: Insufficient documentation

## 2012-01-11 DIAGNOSIS — Z794 Long term (current) use of insulin: Secondary | ICD-10-CM | POA: Insufficient documentation

## 2012-01-11 DIAGNOSIS — Z91199 Patient's noncompliance with other medical treatment and regimen due to unspecified reason: Secondary | ICD-10-CM | POA: Insufficient documentation

## 2012-01-11 DIAGNOSIS — Z79899 Other long term (current) drug therapy: Secondary | ICD-10-CM | POA: Insufficient documentation

## 2012-01-11 DIAGNOSIS — E1069 Type 1 diabetes mellitus with other specified complication: Secondary | ICD-10-CM | POA: Insufficient documentation

## 2012-01-11 DIAGNOSIS — E785 Hyperlipidemia, unspecified: Secondary | ICD-10-CM | POA: Insufficient documentation

## 2012-01-11 DIAGNOSIS — H548 Legal blindness, as defined in USA: Secondary | ICD-10-CM | POA: Insufficient documentation

## 2012-01-11 DIAGNOSIS — Z87448 Personal history of other diseases of urinary system: Secondary | ICD-10-CM | POA: Insufficient documentation

## 2012-01-11 DIAGNOSIS — Z862 Personal history of diseases of the blood and blood-forming organs and certain disorders involving the immune mechanism: Secondary | ICD-10-CM | POA: Insufficient documentation

## 2012-01-11 DIAGNOSIS — R319 Hematuria, unspecified: Secondary | ICD-10-CM | POA: Insufficient documentation

## 2012-01-11 DIAGNOSIS — Z9119 Patient's noncompliance with other medical treatment and regimen: Secondary | ICD-10-CM | POA: Insufficient documentation

## 2012-01-11 LAB — PREGNANCY, URINE: Preg Test, Ur: NEGATIVE

## 2012-01-11 LAB — URINALYSIS, ROUTINE W REFLEX MICROSCOPIC
Glucose, UA: 250 mg/dL — AB
Ketones, ur: NEGATIVE mg/dL
Leukocytes, UA: NEGATIVE
pH: 7.5 (ref 5.0–8.0)

## 2012-01-11 LAB — URINE MICROSCOPIC-ADD ON

## 2012-01-11 MED ORDER — CIPROFLOXACIN HCL 250 MG PO TABS: 250.0000 mg | ORAL_TABLET | Freq: Two times a day (BID) | ORAL | Status: DC

## 2012-01-11 MED ORDER — TRAMADOL-ACETAMINOPHEN 37.5-325 MG PO TABS: ORAL_TABLET | ORAL | Status: DC

## 2012-01-11 MED ORDER — CIPROFLOXACIN HCL 250 MG PO TABS
250.0000 mg | ORAL_TABLET | Freq: Once | ORAL | Status: AC
  Administered 2012-01-11: 250 mg via ORAL
  Filled 2012-01-11: qty 1

## 2012-01-11 MED ORDER — PHENAZOPYRIDINE HCL 100 MG PO TABS
ORAL_TABLET | ORAL | Status: AC
  Filled 2012-01-11: qty 2

## 2012-01-11 MED ORDER — PHENAZOPYRIDINE HCL 100 MG PO TABS
200.0000 mg | ORAL_TABLET | Freq: Once | ORAL | Status: AC
  Administered 2012-01-11: 200 mg via ORAL

## 2012-01-11 MED ORDER — PHENAZOPYRIDINE HCL 200 MG PO TABS: 200.0000 mg | ORAL_TABLET | Freq: Three times a day (TID) | ORAL | Status: DC

## 2012-01-11 NOTE — ED Notes (Addendum)
Bladder scan showed 20ml

## 2012-01-11 NOTE — ED Notes (Signed)
Pt states lower abdominal pain, dysuria, and foul odor to urine. Pt completed dialysis today PTA.

## 2012-01-11 NOTE — ED Provider Notes (Signed)
History  This chart was scribed for Ward Givens, MD by Erskine Emery, ED Scribe. This patient was seen in room APA03/APA03 and the patient's care was started at 14:08.   CSN: [id number]  Arrival date & time 01/11/12  1402   First MD Initiated Contact with Patient 01/11/12 1408      Chief Complaint  Patient presents with  . Dysuria    (Consider location/radiation/quality/duration/timing/severity/associated sxs/prior treatment) The history is provided by the patient. No language interpreter was used.  [name] is a 26 y.o. female who presents to the Emergency Department complaining of severe lower abdominal pain with diffuse raditation, dysuria, hematuria, and odorous urine since Wednesday (4 days ago). Pt reports some associated left flank pain, nausea, and appetite decrease, but denies any emesis, vaginal discharge, or significant fever. She usually urinates about x1-2/day and has been urinating a little more than usual. Pt reports the pain is aggravated by walking. Pt took some tylenol with no relief from symptoms. Pt's LNMP was a couple weeks ago. Pt has a h/o DM and completed dialysis today (she goes Tuesday, Thursday, Satuday which is today). Pt has been on dialysis for a year. Her dialysis access is in her left upper arm. Today her blood presure was high from the pain, so she was given clonidine at the dialysis center. Pt reports one episode of similar symptoms a long time ago. She is currently taking blood pressure medication and insulin. Pt reports she has been with her boyfriend for 7 years, but hasn't been sexually active for months.  Dr. Olena Leatherwood in [place] is the pt's PCP.  Past Medical History  Diagnosis Date  . 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 Hypertension   . Hyperlipidemia   . Nephrotic syndrome   . Anemia     Chronic,  Dr. Mariel Sleet,  Transfused, May, 2012  . DVT (deep venous thrombosis)     Bilateral .  coumadin stopped b/c of bilateral retinal hemorrhages.  . Hypertrophic cardiomyopathy     Diastolic dysfunction  . Legally blind     Bilateral hemorrhagic retinal detachments  . Vaginosis   . History of MRSA infection     Multiple  . Glaucoma(365)   . Dialysis patient     M-W-F  . Acute pyelonephritis   . Medical non-compliance   . Manic depressive disorder   . 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 Anemia of renal disease   . Seizures   . 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 Drug-seeking behavior 12/28/2011    Past Surgical History  Procedure Date  . Left arm cyst removal   . Incise and drain abcess 04/24/11    Multiple MRSA - March 2013 drain port of abcess  . Placement of port a cath     Poor venous access  . Av fistula placement   . Hematoma evacuation 05/25/2011    Procedure: EVACUATION HEMATOMA;  Surgeon: Sherren Kerns, MD;  Location: [facility] OR;  Service: Vascular;  Laterality: Right;  Evacuation Hematoma Right Arm  . Port-a-cath removal 07/04/2011    Procedure: MINOR REMOVAL PORT-A-CATH;  Surgeon: Dalia Heading, MD;  Location: [facility] ORS;  Service: General;  Laterality: Left;  Removal Port-A-Cath/Removal Dialysis Catheter in Minor Room  . Tee without cardioversion 10/29/2011    Procedure: TRANSESOPHAGEAL ECHOCARDIOGRAM (TEE);  Surgeon: Kathlen Brunswick, MD;  Location: [facility] ENDO SUITE;  Service: Cardiovascular;  Laterality: N/A;  . Thrombectomy and revision of arterioventous (av) goretex  graft   . Port-a-cath removal 12/30/2011    Procedure: MINOR REMOVAL PORT-A-CATH;  Surgeon: Fabio Bering, MD;  Location: [facility] ORS;  Service: General;  Laterality: N/A;    Family History  Problem Relation Age of Onset  . Diabetes Mother   . Hypertension Mother   . Diabetes Father     History  Substance Use Topics  . Smoking status: Never Smoker   . Smokeless tobacco: Not on file  . Alcohol Use: No   Pt does not smoke or drink.  She lives  with her boyfriend.  OB History    Grav Para Term Preterm Abortions TAB SAB Ect Mult Living                  Review of Systems  Constitutional: Positive for appetite change. Negative for fever.  Gastrointestinal: Positive for nausea and abdominal pain. Negative for vomiting.  Genitourinary: Positive for dysuria, hematuria and flank pain. Negative for vaginal discharge.  All other systems reviewed and are negative.    Allergies  Ancef; Coumadin; Morphine and related; Sulfa antibiotics; and Tomato  Home Medications   Current Outpatient Rx  Name  Route  Sig  Dispense  Refill  . AMLODIPINE BESYLATE 5 MG PO TABS   Oral   Take 1 tablet (5 mg total) by mouth daily. For treatment of high blood pressure.   30 tablet   3   . CLONIDINE HCL 0.1 MG/24HR TD PTWK   Transdermal   Place 1 patch (0.1 mg total) onto the skin once a week. For treatment of high blood pressure.   4 patch   3   . FAMOTIDINE 20 MG PO TABS   Oral   Take 1 tablet (20 mg total) by mouth daily.   30 tablet   6   . FLUOXETINE HCL 20 MG PO CAPS   Oral   Take 20 mg by mouth daily.         Marland Kitchen GABAPENTIN 300 MG PO CAPS   Oral   Take 1 capsule (300 mg total) by mouth daily. For treatment of nerve pain in the legs.         . INSULIN GLARGINE 100 UNIT/ML [place] SOLN   Subcutaneous   Inject 25 Units into the skin at bedtime.         . INSULIN LISPRO (HUMAN) 100 UNIT/ML [place] SOLN   Subcutaneous   Inject 5-20 Units into the skin 3 (three) times daily before meals. Per sliding scale. Pt takes anywhere from 5 to 20 units.         Marland Kitchen LABETALOL HCL 300 MG PO TABS   Oral   Take 1 tablet (300 mg total) by mouth 3 (three) times daily. For treatment of high blood pressure. The dose was increased to 1 tablet 3 times daily.   90 tablet   3   . METOCLOPRAMIDE HCL 5 MG PO TABS   Oral   Take 1 tablet (5 mg total) by mouth 2 (two) times daily before a meal.   60 tablet   0   . RENA-VITE PO TABS   Oral   Take 1  tablet by mouth daily.         Marland Kitchen ONDANSETRON HCL 8 MG PO TABS   Oral   Take 1 tablet (8 mg total) by mouth every 8 (eight) hours as needed for nausea.  20 tablet   0   . OXYCODONE-ACETAMINOPHEN 5-325 MG PO TABS   Oral   Take 1 tablet by mouth every 4 (four) hours as needed for pain.   15 tablet   0   . POLYETHYLENE GLYCOL 3350 PO PACK   Oral   Take 17 g by mouth daily as needed (For constipation).   14 each      . SEVELAMER CARBONATE 800 MG PO TABS   Oral   Take 800 mg by mouth 3 (three) times daily with meals.         Marland Kitchen VANCOMYCIN 1000 MG IVPB (HEMODIALYSIS)   Intravenous   Inject 1,250 mg into the vein Every Tuesday,Thursday,and Saturday with dialysis. FOR 4-6 WEEKS.           Triage Vitals: BP 164/93  Pulse 99  Temp 98.3 F (36.8 C) (Oral)  Resp 16  SpO2 100%  LMP 12/13/2011  Vital signs normal except hypertension   Physical Exam  Nursing note and vitals reviewed. Constitutional: She is oriented to person, place, and time. She appears well-developed and well-nourished.  Non-toxic appearance. She does not appear ill. She appears distressed.  HENT:  Head: Normocephalic and atraumatic.  Right Ear: External ear normal.  Left Ear: External ear normal.  Nose: Nose normal. No mucosal edema or rhinorrhea.  Mouth/Throat: Mucous membranes are normal. No dental abscesses or uvula swelling.       Tongue is dry.  Eyes: Conjunctivae normal and EOM are normal. Pupils are equal, round, and reactive to light.  Neck: Normal range of motion and full passive range of motion without pain. Neck supple.  Cardiovascular: Normal rate, regular rhythm and normal heart sounds.  Exam reveals no gallop and no friction rub.   No murmur heard. Pulmonary/Chest: Effort normal and breath sounds normal. No respiratory distress. She has no wheezes. She has no rhonchi. She has no rales. She exhibits no tenderness and no crepitus.  Abdominal: Soft. Normal appearance and bowel sounds are  normal. She exhibits no distension. There is tenderness. There is no rebound and no guarding.       Diffuse abdominal tenderness, but most tender over suprapubic area.  Musculoskeletal: Normal range of motion. She exhibits no edema and no tenderness.       Moves all extremities well.  Left flank tenderness.  Neurological: She is alert and oriented to person, place, and time. She has normal strength. No cranial nerve deficit.  Skin: Skin is warm, dry and intact. No rash noted. No erythema. No pallor.  Psychiatric: She has a normal mood and affect. Her speech is normal and behavior is normal. Her mood appears not anxious.    ED Course  Procedures (including critical care time)'   Medications  phenazopyridine (PYRIDIUM) tablet 200 mg (200 mg Oral Given 01/11/12 1541)  ciprofloxacin (CIPRO) tablet 250 mg (250 mg Oral Given 01/11/12 1715)    DIAGNOSTIC STUDIES: Oxygen Saturation is 100% on room air, normal by my interpretation.    COORDINATION OF CARE: 14:24--I evaluated the patient and we discussed a treatment plan including catheterization, urinalysis, and pain medication to which the pt agreed.   Bladder scan shows 20 cc or urine.   Results for orders placed during the hospital encounter of 01/11/12  URINALYSIS, ROUTINE W REFLEX MICROSCOPIC      Component Value Range   Color, Urine YELLOW  YELLOW   APPearance HAZY (*) CLEAR   Specific Gravity, Urine 1.020  1.005 - 1.030   pH  7.5  5.0 - 8.0   Glucose, UA 250 (*) NEGATIVE mg/dL   Hgb urine dipstick SMALL (*) NEGATIVE   Bilirubin Urine NEGATIVE  NEGATIVE   Ketones, ur NEGATIVE  NEGATIVE mg/dL   Protein, ur >409 (*) NEGATIVE mg/dL   Urobilinogen, UA 0.2  0.0 - 1.0 mg/dL   Nitrite NEGATIVE  NEGATIVE   Leukocytes, UA NEGATIVE  NEGATIVE  PREGNANCY, URINE      Component Value Range   Preg Test, Ur NEGATIVE  NEGATIVE  URINE MICROSCOPIC-ADD ON      Component Value Range   Squamous Epithelial / LPF FEW (*) RARE   WBC, UA TOO NUMEROUS  TO COUNT  <3 WBC/hpf   Bacteria, UA FEW (*) RARE   Urine-Other GRANULAR CAST     Laboratory interpretation all normal except urinary tract infection   Ct Abdomen Pelvis W Contrast  12/27/2011  *RADIOLOGY REPORT*  Clinical Data: Abdominal pain.  History of dialysis dependent end- stage renal disease.  CT ABDOMEN AND PELVIS WITH CONTRAST  Technique:  Multidetector CT imaging of the abdomen and pelvis was performed following the standard protocol during bolus administration of intravenous contrast.  Contrast: OMNIPAQUE IOHEXOL 300 MG/ML  SOLN  Comparison: Unenhanced CT dated 10/21/2011.  Findings: The liver, spleen, gallbladder, pancreas, adrenal glands and bowel are unremarkable.  The kidneys show no evidence of obstruction.  The bladder is decompressed.  No free fluid or focal abscess identified.  There is some edema in the subcutaneous tissues.  No evidence of masses, enlarged lymph nodes or bony lesions.  No hernias are identified.  IMPRESSION: No acute abnormalities.   Original Report Authenticated By: Irish Lack, M.D.     1. UTI (lower urinary tract infection)    New Prescriptions   CIPROFLOXACIN (CIPRO) 250 MG TABLET    Take 1 tablet (250 mg total) by mouth every 12 (twelve) hours.   PHENAZOPYRIDINE (PYRIDIUM) 200 MG TABLET    Take 1 tablet (200 mg total) by mouth 3 (three) times daily.   TRAMADOL-ACETAMINOPHEN (ULTRACET) 37.5-325 MG PER TABLET    2 tabs po QID prn pain    Plan discharge  Devoria Albe, MD, FACEP    MDM   I personally performed the services described in this documentation, which was scribed in my presence. The recorded information has been reviewed and considered.  Devoria Albe, MD, Armando Gang    Ward Givens, MD 01/11/12 [phone]

## 2012-01-11 NOTE — ED Notes (Signed)
Bladder Scan complete. 20 ml found in bladder. Pt tolerated well. MD aware.

## 2012-01-11 NOTE — ED Notes (Signed)
Pt reports she needs a ride home. Cab company contacted and stated they would be here in 30 minutes. Pt updated and aware.

## 2012-01-12 LAB — URINE CULTURE

## 2012-02-08 ENCOUNTER — Emergency Department (HOSPITAL_COMMUNITY)
Admission: EM | Admit: 2012-02-08 | Discharge: 2012-02-09 | Disposition: A | Discharge status: Home / Self Care | Payer: Medicaid Other | Attending: Emergency Medicine | Admitting: Emergency Medicine

## 2012-02-08 ENCOUNTER — Encounter (HOSPITAL_COMMUNITY): Payer: Self-pay | Admitting: *Deleted

## 2012-02-08 DIAGNOSIS — E119 Type 2 diabetes mellitus without complications: Secondary | ICD-10-CM | POA: Insufficient documentation

## 2012-02-08 DIAGNOSIS — Z992 Dependence on renal dialysis: Secondary | ICD-10-CM | POA: Insufficient documentation

## 2012-02-08 DIAGNOSIS — M79673 Pain in unspecified foot: Secondary | ICD-10-CM

## 2012-02-08 DIAGNOSIS — Z79899 Other long term (current) drug therapy: Secondary | ICD-10-CM | POA: Insufficient documentation

## 2012-02-08 DIAGNOSIS — M79609 Pain in unspecified limb: Secondary | ICD-10-CM | POA: Insufficient documentation

## 2012-02-08 DIAGNOSIS — R079 Chest pain, unspecified: Secondary | ICD-10-CM | POA: Insufficient documentation

## 2012-02-08 DIAGNOSIS — Z794 Long term (current) use of insulin: Secondary | ICD-10-CM | POA: Insufficient documentation

## 2012-02-08 DIAGNOSIS — D649 Anemia, unspecified: Secondary | ICD-10-CM

## 2012-02-08 DIAGNOSIS — E785 Hyperlipidemia, unspecified: Secondary | ICD-10-CM | POA: Insufficient documentation

## 2012-02-08 DIAGNOSIS — Z8679 Personal history of other diseases of the circulatory system: Secondary | ICD-10-CM | POA: Insufficient documentation

## 2012-02-08 DIAGNOSIS — Z8614 Personal history of Methicillin resistant Staphylococcus aureus infection: Secondary | ICD-10-CM | POA: Insufficient documentation

## 2012-02-08 DIAGNOSIS — I10 Essential (primary) hypertension: Secondary | ICD-10-CM | POA: Insufficient documentation

## 2012-02-08 DIAGNOSIS — H409 Unspecified glaucoma: Secondary | ICD-10-CM | POA: Insufficient documentation

## 2012-02-08 DIAGNOSIS — N186 End stage renal disease: Secondary | ICD-10-CM | POA: Insufficient documentation

## 2012-02-08 NOTE — ED Notes (Addendum)
Pt brought in by [facility] stating initially that pt thought she had been bitten by a bug on the inside of her left foot. While in route, pt began having chest tightness, pt states the chest tightness was from her being anxious about the bug bite. Pt now states that her chest tightness has eased off. When getting ready to take pt to waiting area, I noticed a quarter size blister on pts left outer heel.

## 2012-02-09 ENCOUNTER — Emergency Department (HOSPITAL_COMMUNITY): Payer: Medicaid Other

## 2012-02-09 LAB — CBC
HCT: 28.3 % — ABNORMAL LOW (ref 36.0–46.0)
Hemoglobin: 9 g/dL — ABNORMAL LOW (ref 12.0–15.0)
MCV: 95.3 fL (ref 78.0–100.0)
WBC: 5.6 10*3/uL (ref 4.0–10.5)

## 2012-02-09 LAB — BASIC METABOLIC PANEL
BUN: 25 mg/dL — ABNORMAL HIGH (ref 6–23)
CO2: 26 mEq/L (ref 19–32)
Chloride: 98 mEq/L (ref 96–112)
Glucose, Bld: 244 mg/dL — ABNORMAL HIGH (ref 70–99)
Potassium: 3.3 mEq/L — ABNORMAL LOW (ref 3.5–5.1)

## 2012-02-09 LAB — PROTIME-INR: INR: 1.07 (ref 0.00–1.49)

## 2012-02-09 MED ORDER — TRAMADOL HCL 50 MG PO TABS
50.0000 mg | ORAL_TABLET | Freq: Once | ORAL | Status: AC
  Administered 2012-02-09: 50 mg via ORAL
  Filled 2012-02-09: qty 1

## 2012-02-09 NOTE — ED Provider Notes (Signed)
History     CSN: [id number]  Arrival date & time 02/08/12  2203   First MD Initiated Contact with Patient 02/08/12 2354      Chief Complaint  Patient presents with  . Chest Pain  . Foot Pain    (Consider location/radiation/quality/duration/timing/severity/associated sxs/prior treatment) HPI [name] is a 27 y.o. female  With a h/o nephrotic syndrome on dialysis ([facility]), anemia, DVT on coumadin, HTN, legally blind, brought in by ambulance, who presents to the Emergency Department complaining of left heel pain that began yesterday and chest discomfort that developed just prior to arrival. Heel pain with walking or palpation. She has taken no medicines.   PCP Dr. Olena Leatherwood Past Medical History  Diagnosis Date  . 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 Hypertension   . Hyperlipidemia   . Nephrotic syndrome   . Anemia     Chronic,  Dr. Mariel Sleet,  Transfused, May, 2012  . DVT (deep venous thrombosis)     Bilateral . coumadin stopped b/c of bilateral retinal hemorrhages.  . Hypertrophic cardiomyopathy     Diastolic dysfunction  . Legally blind     Bilateral hemorrhagic retinal detachments  . Vaginosis   . History of MRSA infection     Multiple  . Glaucoma(365)   . Dialysis patient     M-W-F  . Acute pyelonephritis   . Medical non-compliance   . Manic depressive disorder   . 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 Anemia of renal disease   . Seizures   . 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 Drug-seeking behavior 12/28/2011    Past Surgical History  Procedure Date  . Left arm cyst removal   . Incise and drain abcess 04/24/11    Multiple MRSA - March 2013 drain port of abcess  . Placement of port a cath     Poor venous access  . Av fistula placement   . Hematoma evacuation 05/25/2011    Procedure: EVACUATION HEMATOMA;  Surgeon: Sherren Kerns, MD;  Location: [facility] OR;   Service: Vascular;  Laterality: Right;  Evacuation Hematoma Right Arm  . Port-a-cath removal 07/04/2011    Procedure: MINOR REMOVAL PORT-A-CATH;  Surgeon: Dalia Heading, MD;  Location: [facility] ORS;  Service: General;  Laterality: Left;  Removal Port-A-Cath/Removal Dialysis Catheter in Minor Room  . Tee without cardioversion 10/29/2011    Procedure: TRANSESOPHAGEAL ECHOCARDIOGRAM (TEE);  Surgeon: Kathlen Brunswick, MD;  Location: [facility] ENDO SUITE;  Service: Cardiovascular;  Laterality: N/A;  . Thrombectomy and revision of arterioventous (av) goretex  graft   . Port-a-cath removal 12/30/2011    Procedure: MINOR REMOVAL PORT-A-CATH;  Surgeon: Fabio Bering, MD;  Location: [facility] ORS;  Service: General;  Laterality: N/A;    Family History  Problem Relation Age of Onset  . Diabetes Mother   . Hypertension Mother   . Diabetes Father     History  Substance Use Topics  . Smoking status: Never Smoker   . Smokeless tobacco: Not on file  . Alcohol Use: No    OB History    Grav Para Term Preterm Abortions TAB SAB Ect Mult Living                  Review of Systems  Constitutional: Negative for fever.       10 Systems reviewed and are negative for acute change except as noted  in the HPI.  HENT: Negative for congestion.   Eyes: Negative for discharge and redness.  Respiratory: Negative for cough and shortness of breath.   Cardiovascular: Positive for chest pain.  Gastrointestinal: Negative for vomiting and abdominal pain.  Musculoskeletal: Negative for back pain.       Left foot pain  Skin: Negative for rash.  Neurological: Negative for syncope, numbness and headaches.  Psychiatric/Behavioral:       No behavior change.    Allergies  Ancef; Coumadin; Morphine and related; Sulfa antibiotics; and Tomato  Home Medications   Current Outpatient Rx  Name  Route  Sig  Dispense  Refill  . BENAZEPRIL HCL 10 MG PO TABS   Oral   Take 10 mg by mouth daily.         Marland Kitchen CIPROFLOXACIN HCL 250 MG PO  TABS   Oral   Take 1 tablet (250 mg total) by mouth every 12 (twelve) hours.   14 tablet   0   . CLONIDINE HCL 0.1 MG/24HR TD PTWK   Transdermal   Place 1 patch (0.1 mg total) onto the skin once a week. For treatment of high blood pressure.   4 patch   3   . FAMOTIDINE 20 MG PO TABS   Oral   Take 1 tablet (20 mg total) by mouth daily.   30 tablet   6   . FLUOXETINE HCL 20 MG PO CAPS   Oral   Take 20 mg by mouth daily.         Marland Kitchen GABAPENTIN 300 MG PO CAPS   Oral   Take 1 capsule (300 mg total) by mouth daily. For treatment of nerve pain in the legs.         . INSULIN GLARGINE 100 UNIT/ML [place] SOLN   Subcutaneous   Inject 25 Units into the skin at bedtime.         . INSULIN LISPRO (HUMAN) 100 UNIT/ML [place] SOLN   Subcutaneous   Inject 5-20 Units into the skin 3 (three) times daily before meals. Per sliding scale. Pt takes anywhere from 5 to 20 units.         Marland Kitchen LABETALOL HCL 300 MG PO TABS   Oral   Take 1 tablet (300 mg total) by mouth 3 (three) times daily. For treatment of high blood pressure. The dose was increased to 1 tablet 3 times daily.   90 tablet   3   . METOCLOPRAMIDE HCL 5 MG PO TABS   Oral   Take 1 tablet (5 mg total) by mouth 2 (two) times daily before a meal.   60 tablet   0   . RENA-VITE PO TABS   Oral   Take 1 tablet by mouth daily.         Marland Kitchen ONDANSETRON HCL 8 MG PO TABS   Oral   Take 1 tablet (8 mg total) by mouth every 8 (eight) hours as needed for nausea.   20 tablet   0   . PHENAZOPYRIDINE HCL 200 MG PO TABS   Oral   Take 1 tablet (200 mg total) by mouth 3 (three) times daily.   6 tablet   0   . POLYETHYLENE GLYCOL 3350 PO PACK   Oral   Take 17 g by mouth daily as needed (For constipation).   14 each      . SEVELAMER CARBONATE 800 MG PO TABS   Oral   Take 1,600 mg by mouth 3 (  three) times daily with meals.          Marland Kitchen VANCOMYCIN 1000 MG IVPB (HEMODIALYSIS)   Intravenous   Inject 1,250 mg into the vein Every  Tuesday,Thursday,and Saturday with dialysis. FOR 4-6 WEEKS.         Marland Kitchen TRAMADOL-ACETAMINOPHEN 37.5-325 MG PO TABS      2 tabs po QID prn pain   16 tablet   0   . TRAZODONE HCL 100 MG PO TABS   Oral   Take 100 mg by mouth at bedtime.           BP 164/88  Pulse 113  Temp 98 F (36.7 C)  Resp 20  Ht 5\' 8"  (1.727 m)  Wt 150 lb (68.04 kg)  BMI 22.81 kg/m2  SpO2 100%  LMP 01/26/2012  Physical Exam  Nursing note and vitals reviewed. Constitutional: She appears well-developed and well-nourished.       Awake, alert, nontoxic appearance.  HENT:  Head: Atraumatic.  Eyes: Conjunctivae normal and EOM are normal. Right eye exhibits no discharge. Left eye exhibits no discharge.  Neck: Neck supple.  Cardiovascular: Normal heart sounds and intact distal pulses.   Pulmonary/Chest: Effort normal and breath sounds normal. She exhibits no tenderness.  Abdominal: Soft. There is no tenderness. There is no rebound.  Musculoskeletal: She exhibits no tenderness.       Baseline ROM, no obvious new focal weakness.AVG left arm  Neurological:       Mental status and motor strength appears baseline for patient and situation.  Skin: No rash noted.  Psychiatric: She has a normal mood and affect.    ED Course  Procedures (including critical care time) Results for orders placed during the hospital encounter of 02/08/12  CBC      Component Value Range   WBC 5.6  4.0 - 10.5 K/uL   RBC 2.97 (*) 3.87 - 5.11 MIL/uL   Hemoglobin 9.0 (*) 12.0 - 15.0 g/dL   HCT 40.3 (*) 47.4 - 25.9 %   MCV 95.3  78.0 - 100.0 fL   MCH 30.3  26.0 - 34.0 pg   MCHC 31.8  30.0 - 36.0 g/dL   RDW 56.3  87.5 - 64.3 %   Platelets 253  150 - 400 K/uL  BASIC METABOLIC PANEL      Component Value Range   Sodium 135  135 - 145 mEq/L   Potassium 3.3 (*) 3.5 - 5.1 mEq/L   Chloride 98  96 - 112 mEq/L   CO2 26  19 - 32 mEq/L   Glucose, Bld 244 (*) 70 - 99 mg/dL   BUN 25 (*) 6 - 23 mg/dL   Creatinine, Ser 3.29 (*) 0.50 - 1.10  mg/dL   Calcium 9.4  8.4 - 51.8 mg/dL   GFR calc non Af Amer 9 (*) >90 mL/min   GFR calc Af Amer 10 (*) >90 mL/min  TROPONIN I      Component Value Range   Troponin I <0.30  <0.30 ng/mL  PROTIME-INR      Component Value Range   Prothrombin Time 13.8  11.6 - 15.2 seconds   INR 1.07  0.00 - 1.49     Date: 02/09/2012   0006  Rate: 105  Rhythm: sinus tachycardia  QRS Axis: normal  Intervals: normal  ST/T Wave abnormalities: normal  Conduction Disutrbances:none  Narrative Interpretation:   Old EKG Reviewed: none available and unchanged Dg Foot Complete Left  02/09/2012  *RADIOLOGY REPORT*  Clinical Data:  Left foot pain and swelling.  LEFT FOOT - COMPLETE 3+ VIEW  Comparison: None.  Findings: Normal appearing bones and soft tissues.  IMPRESSION: Normal examination.   Original Report Authenticated By: Beckie Salts, M.D.     MDM  Patient presents with foot pain. PE with blister to the side of her foot, no other lesions. Labs reflect her ESRD, chronic anemia. EKG and troponin unremarkable. Xray of her foot without acute findings. Patient was given tramadol for discomfort.Pt stable in ED with no significant deterioration in condition.The patient appears reasonably screened and/or stabilized for discharge and I doubt any other medical condition or other [facility] requiring further screening, evaluation, or treatment in the ED at this time prior to discharge.  MDM Reviewed: nursing note and vitals Interpretation: labs, ECG and x-ray           Nicoletta Dress. Colon Branch, MD 02/09/12 [phone]

## 2012-02-09 NOTE — ED Notes (Signed)
Pt able to stand on affected foot and move self from w/c to stretcher w/o difficulty

## 2012-02-29 ENCOUNTER — Emergency Department (HOSPITAL_COMMUNITY)
Admission: EM | Admit: 2012-02-29 | Discharge: 2012-02-29 | Disposition: A | Discharge status: Home / Self Care | Payer: Medicaid Other | Attending: Emergency Medicine | Admitting: Emergency Medicine

## 2012-02-29 ENCOUNTER — Encounter (HOSPITAL_COMMUNITY): Payer: Self-pay | Admitting: Emergency Medicine

## 2012-02-29 DIAGNOSIS — Z79899 Other long term (current) drug therapy: Secondary | ICD-10-CM | POA: Insufficient documentation

## 2012-02-29 DIAGNOSIS — R5381 Other malaise: Secondary | ICD-10-CM | POA: Insufficient documentation

## 2012-02-29 DIAGNOSIS — F319 Bipolar disorder, unspecified: Secondary | ICD-10-CM | POA: Insufficient documentation

## 2012-02-29 DIAGNOSIS — Z8614 Personal history of Methicillin resistant Staphylococcus aureus infection: Secondary | ICD-10-CM | POA: Insufficient documentation

## 2012-02-29 DIAGNOSIS — Z862 Personal history of diseases of the blood and blood-forming organs and certain disorders involving the immune mechanism: Secondary | ICD-10-CM | POA: Insufficient documentation

## 2012-02-29 DIAGNOSIS — Z8679 Personal history of other diseases of the circulatory system: Secondary | ICD-10-CM | POA: Insufficient documentation

## 2012-02-29 DIAGNOSIS — Z8742 Personal history of other diseases of the female genital tract: Secondary | ICD-10-CM | POA: Insufficient documentation

## 2012-02-29 DIAGNOSIS — Z9119 Patient's noncompliance with other medical treatment and regimen: Secondary | ICD-10-CM | POA: Insufficient documentation

## 2012-02-29 DIAGNOSIS — R531 Weakness: Secondary | ICD-10-CM

## 2012-02-29 DIAGNOSIS — Z7901 Long term (current) use of anticoagulants: Secondary | ICD-10-CM | POA: Insufficient documentation

## 2012-02-29 DIAGNOSIS — E1049 Type 1 diabetes mellitus with other diabetic neurological complication: Secondary | ICD-10-CM | POA: Insufficient documentation

## 2012-02-29 DIAGNOSIS — Z87448 Personal history of other diseases of urinary system: Secondary | ICD-10-CM | POA: Insufficient documentation

## 2012-02-29 DIAGNOSIS — H409 Unspecified glaucoma: Secondary | ICD-10-CM | POA: Insufficient documentation

## 2012-02-29 DIAGNOSIS — Z86718 Personal history of other venous thrombosis and embolism: Secondary | ICD-10-CM | POA: Insufficient documentation

## 2012-02-29 DIAGNOSIS — N390 Urinary tract infection, site not specified: Secondary | ICD-10-CM | POA: Insufficient documentation

## 2012-02-29 DIAGNOSIS — I120 Hypertensive chronic kidney disease with stage 5 chronic kidney disease or end stage renal disease: Secondary | ICD-10-CM | POA: Insufficient documentation

## 2012-02-29 DIAGNOSIS — Z794 Long term (current) use of insulin: Secondary | ICD-10-CM | POA: Insufficient documentation

## 2012-02-29 DIAGNOSIS — D631 Anemia in chronic kidney disease: Secondary | ICD-10-CM | POA: Insufficient documentation

## 2012-02-29 DIAGNOSIS — Z91199 Patient's noncompliance with other medical treatment and regimen due to unspecified reason: Secondary | ICD-10-CM | POA: Insufficient documentation

## 2012-02-29 DIAGNOSIS — Z8639 Personal history of other endocrine, nutritional and metabolic disease: Secondary | ICD-10-CM | POA: Insufficient documentation

## 2012-02-29 DIAGNOSIS — K3184 Gastroparesis: Secondary | ICD-10-CM | POA: Insufficient documentation

## 2012-02-29 DIAGNOSIS — G40909 Epilepsy, unspecified, not intractable, without status epilepticus: Secondary | ICD-10-CM | POA: Insufficient documentation

## 2012-02-29 DIAGNOSIS — N186 End stage renal disease: Secondary | ICD-10-CM | POA: Insufficient documentation

## 2012-02-29 DIAGNOSIS — H548 Legal blindness, as defined in USA: Secondary | ICD-10-CM | POA: Insufficient documentation

## 2012-02-29 DIAGNOSIS — Z992 Dependence on renal dialysis: Secondary | ICD-10-CM | POA: Insufficient documentation

## 2012-02-29 LAB — CBC WITH DIFFERENTIAL/PLATELET
Basophils Relative: 1 % (ref 0–1)
Eosinophils Absolute: 0.3 10*3/uL (ref 0.0–0.7)
Eosinophils Relative: 6 % — ABNORMAL HIGH (ref 0–5)
HCT: 36.4 % (ref 36.0–46.0)
Hemoglobin: 11.7 g/dL — ABNORMAL LOW (ref 12.0–15.0)
MCH: 31.3 pg (ref 26.0–34.0)
MCHC: 32.1 g/dL (ref 30.0–36.0)
MCV: 97.3 fL (ref 78.0–100.0)
Monocytes Absolute: 0.5 10*3/uL (ref 0.1–1.0)
Monocytes Relative: 9 % (ref 3–12)

## 2012-02-29 LAB — URINALYSIS, ROUTINE W REFLEX MICROSCOPIC
Bilirubin Urine: NEGATIVE
Ketones, ur: NEGATIVE mg/dL
Nitrite: NEGATIVE
Urobilinogen, UA: 0.2 mg/dL (ref 0.0–1.0)

## 2012-02-29 LAB — TROPONIN I: Troponin I: <0.3 ng/mL (ref <0.30)

## 2012-02-29 LAB — BASIC METABOLIC PANEL
BUN: 35 mg/dL — ABNORMAL HIGH (ref 6–23)
Creatinine, Ser: 5.87 mg/dL — ABNORMAL HIGH (ref 0.50–1.10)
GFR calc Af Amer: 11 mL/min — ABNORMAL LOW (ref >90)
GFR calc non Af Amer: 9 mL/min — ABNORMAL LOW (ref >90)

## 2012-02-29 LAB — URINE MICROSCOPIC-ADD ON

## 2012-02-29 MED ORDER — FENTANYL CITRATE 0.05 MG/ML IJ SOLN
50.0000 ug | Freq: Once | INTRAMUSCULAR | Status: AC
  Administered 2012-02-29: 50 ug via INTRAVENOUS
  Filled 2012-02-29: qty 2

## 2012-02-29 MED ORDER — CIPROFLOXACIN HCL 250 MG PO TABS
250.0000 mg | ORAL_TABLET | Freq: Two times a day (BID) | ORAL | Status: DC
  Administered 2012-02-29: 250 mg via ORAL
  Filled 2012-02-29: qty 1

## 2012-02-29 MED ORDER — CIPROFLOXACIN HCL 250 MG PO TABS: 250.0000 mg | ORAL_TABLET | Freq: Two times a day (BID) | ORAL | Status: DC

## 2012-02-29 MED ORDER — SODIUM CHLORIDE 0.9 % IV BOLUS (SEPSIS)
500.0000 mL | Freq: Once | INTRAVENOUS | Status: AC
  Administered 2012-02-29: 500 mL via INTRAVENOUS

## 2012-02-29 NOTE — ED Notes (Signed)
EMS states they are unable to take patient back home. Started to get patient a cab voucher, but patient states that her "ex" is going to come pick her up. Patient with no complaints at this time. Respirations even and unlabored. Skin warm/dry. Discharge instructions reviewed with patient at this time. Patient given opportunity to voice concerns/ask questions. IV removed per policy and band-aid applied to site. Patient discharged at this time and left Emergency Department via wheelchair to wait for ride in waiting room.

## 2012-02-29 NOTE — ED Provider Notes (Signed)
History   This chart was scribed for Charles B. Bernette Mayers, MD by Toya Smothers, ED Scribe. The patient was seen in room APA02/APA02. Patient's care was started at 1403.  CSN: [id number]  Arrival date & time 02/29/12  1403   First MD Initiated Contact with Patient 02/29/12 1417      Chief Complaint  Patient presents with  . Weakness    HPI  [name] is a 27 y.o. female with h/o nephrotic syndrome on dialysis ([facility]), anemia, DVT on coumadin, HTN, legally blind, who presents to the Emergency Department complaining of 6 hours of sudden onset, constant, progressive bilateral lower extremity pain and generalized weakness, with dizziness upon standing. Pain is moderate, aching, and worse with ambulation. She had normal dialysis this AM but home health nurse found her lying on the couch, tachycardic and called EMS. Symptoms have not been treated PTA. No fever, chills, cough, congestion, rhinorrhea, chest pain, SOB, or n/v/d. Pt denies use of tobacco, alcohol, and illicit drugs. Surgical Hx includes AV fistula placement and Hematoma evacuation.    Past Medical History  Diagnosis Date  . 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 Hypertension   . Hyperlipidemia   . Nephrotic syndrome   . Anemia     Chronic,  Dr. Mariel Sleet,  Transfused, May, 2012  . DVT (deep venous thrombosis)     Bilateral . coumadin stopped b/c of bilateral retinal hemorrhages.  . Hypertrophic cardiomyopathy     Diastolic dysfunction  . Legally blind     Bilateral hemorrhagic retinal detachments  . Vaginosis   . History of MRSA infection     Multiple  . Glaucoma(365)   . Dialysis patient     M-W-F  . Acute pyelonephritis   . Medical non-compliance   . Manic depressive disorder   . 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 Anemia of renal disease   . Seizures   . 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 Drug-seeking  behavior 12/28/2011    Past Surgical History  Procedure Date  . Left arm cyst removal   . Incise and drain abcess 04/24/11    Multiple MRSA - March 2013 drain port of abcess  . Placement of port a cath     Poor venous access  . Av fistula placement   . Hematoma evacuation 05/25/2011    Procedure: EVACUATION HEMATOMA;  Surgeon: Sherren Kerns, MD;  Location: [facility] OR;  Service: Vascular;  Laterality: Right;  Evacuation Hematoma Right Arm  . Port-a-cath removal 07/04/2011    Procedure: MINOR REMOVAL PORT-A-CATH;  Surgeon: Dalia Heading, MD;  Location: [facility] ORS;  Service: General;  Laterality: Left;  Removal Port-A-Cath/Removal Dialysis Catheter in Minor Room  . Tee without cardioversion 10/29/2011    Procedure: TRANSESOPHAGEAL ECHOCARDIOGRAM (TEE);  Surgeon: Kathlen Brunswick, MD;  Location: [facility] ENDO SUITE;  Service: Cardiovascular;  Laterality: N/A;  . Thrombectomy and revision of arterioventous (av) goretex  graft   . Port-a-cath removal 12/30/2011    Procedure: MINOR REMOVAL PORT-A-CATH;  Surgeon: Fabio Bering, MD;  Location: [facility] ORS;  Service: General;  Laterality: N/A;    Family History  Problem Relation Age of Onset  . Diabetes Mother   . Hypertension Mother   . Diabetes Father     History  Substance Use Topics  . Smoking status: Never Smoker   . Smokeless tobacco: Not on file  .  Alcohol Use: No    OB History    Grav Para Term Preterm Abortions TAB SAB Ect Mult Living                  Review of Systems  All other systems reviewed and are negative.    Allergies  Ancef; Coumadin; Morphine and related; Sulfa antibiotics; and Tomato  Home Medications   Current Outpatient Rx  Name  Route  Sig  Dispense  Refill  . BENAZEPRIL HCL 10 MG PO TABS   Oral   Take 10 mg by mouth daily.         Marland Kitchen CIPROFLOXACIN HCL 250 MG PO TABS   Oral   Take 1 tablet (250 mg total) by mouth every 12 (twelve) hours.   14 tablet   0   . CLONIDINE HCL 0.1 MG/24HR TD PTWK    Transdermal   Place 1 patch (0.1 mg total) onto the skin once a week. For treatment of high blood pressure.   4 patch   3   . FAMOTIDINE 20 MG PO TABS   Oral   Take 1 tablet (20 mg total) by mouth daily.   30 tablet   6   . FLUOXETINE HCL 20 MG PO CAPS   Oral   Take 20 mg by mouth daily.         Marland Kitchen GABAPENTIN 300 MG PO CAPS   Oral   Take 1 capsule (300 mg total) by mouth daily. For treatment of nerve pain in the legs.         . INSULIN GLARGINE 100 UNIT/ML [place] SOLN   Subcutaneous   Inject 25 Units into the skin at bedtime.         . INSULIN LISPRO (HUMAN) 100 UNIT/ML  SOLN   Subcutaneous   Inject 5-20 Units into the skin 3 (three) times daily before meals. Per sliding scale. Pt takes anywhere from 5 to 20 units.         Marland Kitchen LABETALOL HCL 300 MG PO TABS   Oral   Take 1 tablet (300 mg total) by mouth 3 (three) times daily. For treatment of high blood pressure. The dose was increased to 1 tablet 3 times daily.   90 tablet   3   . METOCLOPRAMIDE HCL 5 MG PO TABS   Oral   Take 1 tablet (5 mg total) by mouth 2 (two) times daily before a meal.   60 tablet   0   . RENA-VITE PO TABS   Oral   Take 1 tablet by mouth daily.         Marland Kitchen ONDANSETRON HCL 8 MG PO TABS   Oral   Take 1 tablet (8 mg total) by mouth every 8 (eight) hours as needed for nausea.   20 tablet   0   . PHENAZOPYRIDINE HCL 200 MG PO TABS   Oral   Take 1 tablet (200 mg total) by mouth 3 (three) times daily.   6 tablet   0   . POLYETHYLENE GLYCOL 3350 PO PACK   Oral   Take 17 g by mouth daily as needed (For constipation).   14 each      . SEVELAMER CARBONATE 800 MG PO TABS   Oral   Take 1,600 mg by mouth 3 (three) times daily with meals.          Marland Kitchen VANCOMYCIN 1000 MG IVPB (HEMODIALYSIS)   Intravenous   Inject 1,250 mg into the  vein Every Tuesday,Thursday,and Saturday with dialysis. FOR 4-6 WEEKS.         Marland Kitchen TRAMADOL-ACETAMINOPHEN 37.5-325 MG PO TABS      2 tabs po QID prn pain   16  tablet   0   . TRAZODONE HCL 100 MG PO TABS   Oral   Take 100 mg by mouth at bedtime.           BP 133/95  Pulse 119  Temp 98.3 F (36.8 C) (Oral)  Resp 18  Wt 150 lb (68.04 kg)  SpO2 100%  LMP 01/19/2012  Physical Exam  Nursing note and vitals reviewed. Constitutional: She is oriented to person, place, and time. She appears well-developed and well-nourished.  HENT:  Head: Normocephalic and atraumatic.  Eyes:       Blind.  Neck: Normal range of motion. Neck supple.  Cardiovascular: Normal rate, normal heart sounds and intact distal pulses.   Pulmonary/Chest: Effort normal and breath sounds normal.  Abdominal: Bowel sounds are normal. She exhibits no distension. There is no tenderness.  Musculoskeletal: Normal range of motion. She exhibits tenderness (diffuse soft tissue tenderness of LE). She exhibits no edema.       Pulse intact through bilateral lower extremities. Palpable thrill over dialysis site.  Neurological: She is alert and oriented to person, place, and time. She has normal strength. No cranial nerve deficit or sensory deficit.  Skin: Skin is warm and dry. No rash noted.  Psychiatric: She has a normal mood and affect.    ED Course  Procedures DIAGNOSTIC STUDIES: Oxygen Saturation is 100% on room air, normal by my interpretation.    COORDINATION OF CARE: 14:08- Ordered ED EKG Once. Ordered Basic metabolic panel, CBC with Differential, Urine Culture, and Urinalysis Routine w reflex microscopic. 14:18- Evaluated Pt. Pt is awake though lethargic. 14:22- Patient understand and agree with initial ED impression and plan with expectations set for ED visit.       Labs Reviewed  CBC WITH DIFFERENTIAL  BASIC METABOLIC PANEL  URINALYSIS, ROUTINE W REFLEX MICROSCOPIC  PREGNANCY, URINE   No results found.   No diagnosis found.    MDM   Date: 02/29/2012  Rate: 113  Rhythm: sinus tachycardia  QRS Axis: right  Intervals: normal  ST/T Wave  abnormalities: nonspecific T wave changes  Conduction Disutrbances:none  Narrative Interpretation:   Old EKG Reviewed: changes noted inferior T wave changes are new    4:46 PM Pt feeling better. HR improved. UA concerning for UTI, but otherwise labs are unremarkable. UA sent for culture.     I personally performed the services described in this documentation, which was scribed in my presence. The recorded information has been reviewed and is accurate.     Charles B. Bernette Mayers, MD 02/29/12 1929

## 2012-02-29 NOTE — ED Notes (Signed)
Patient with c/o generalized weakness that started today. Patient arouses to voice.

## 2012-02-29 NOTE — ED Notes (Signed)
Foley catheter placed to obtain urine specimen due to patient with vaginal "spotting" and patient's c/o weakness. Small amount of urine returned. Will get urine specimen when enough is present.

## 2012-02-29 NOTE — ED Notes (Signed)
Patient requesting pain medication. Dr Bernette Mayers aware.

## 2012-02-29 NOTE — ED Notes (Signed)
Patient completed dialysis this morning. Found by Home health RN.

## 2012-02-29 NOTE — ED Notes (Signed)
Patient states she does not have anyone to come get her and states she does not have money for a cab. Requesting the ambulance comes and takes her home. Patient informed that her insurance would most likely not pay for EMS to take her home and that she would be responsible for the bill. "That's ok, they can just bill me like last time"

## 2012-02-29 NOTE — ED Notes (Signed)
Pt. stated she did not want to stand for orthostatic vital signs.

## 2012-03-03 LAB — URINE CULTURE: Colony Count: 5000

## 2012-03-12 ENCOUNTER — Encounter (HOSPITAL_COMMUNITY): Payer: Self-pay | Admitting: *Deleted

## 2012-03-12 ENCOUNTER — Emergency Department (HOSPITAL_COMMUNITY)
Admission: EM | Admit: 2012-03-12 | Discharge: 2012-03-12 | Disposition: A | Discharge status: Home / Self Care | Payer: Medicare Other | Attending: Emergency Medicine | Admitting: Emergency Medicine

## 2012-03-12 DIAGNOSIS — Z87448 Personal history of other diseases of urinary system: Secondary | ICD-10-CM | POA: Insufficient documentation

## 2012-03-12 DIAGNOSIS — N186 End stage renal disease: Secondary | ICD-10-CM | POA: Insufficient documentation

## 2012-03-12 DIAGNOSIS — G40909 Epilepsy, unspecified, not intractable, without status epilepticus: Secondary | ICD-10-CM | POA: Insufficient documentation

## 2012-03-12 DIAGNOSIS — Z8619 Personal history of other infectious and parasitic diseases: Secondary | ICD-10-CM | POA: Insufficient documentation

## 2012-03-12 DIAGNOSIS — H409 Unspecified glaucoma: Secondary | ICD-10-CM | POA: Insufficient documentation

## 2012-03-12 DIAGNOSIS — T82898A Other specified complication of vascular prosthetic devices, implants and grafts, initial encounter: Secondary | ICD-10-CM | POA: Insufficient documentation

## 2012-03-12 DIAGNOSIS — M79622 Pain in left upper arm: Secondary | ICD-10-CM

## 2012-03-12 DIAGNOSIS — Z8614 Personal history of Methicillin resistant Staphylococcus aureus infection: Secondary | ICD-10-CM | POA: Insufficient documentation

## 2012-03-12 DIAGNOSIS — Z8719 Personal history of other diseases of the digestive system: Secondary | ICD-10-CM | POA: Insufficient documentation

## 2012-03-12 DIAGNOSIS — I120 Hypertensive chronic kidney disease with stage 5 chronic kidney disease or end stage renal disease: Secondary | ICD-10-CM | POA: Insufficient documentation

## 2012-03-12 DIAGNOSIS — Z79899 Other long term (current) drug therapy: Secondary | ICD-10-CM | POA: Insufficient documentation

## 2012-03-12 DIAGNOSIS — E1039 Type 1 diabetes mellitus with other diabetic ophthalmic complication: Secondary | ICD-10-CM | POA: Insufficient documentation

## 2012-03-12 DIAGNOSIS — E1049 Type 1 diabetes mellitus with other diabetic neurological complication: Secondary | ICD-10-CM | POA: Insufficient documentation

## 2012-03-12 DIAGNOSIS — M79609 Pain in unspecified limb: Secondary | ICD-10-CM | POA: Insufficient documentation

## 2012-03-12 DIAGNOSIS — Z992 Dependence on renal dialysis: Secondary | ICD-10-CM | POA: Insufficient documentation

## 2012-03-12 DIAGNOSIS — IMO0001 Reserved for inherently not codable concepts without codable children: Secondary | ICD-10-CM | POA: Insufficient documentation

## 2012-03-12 DIAGNOSIS — I770 Arteriovenous fistula, acquired: Secondary | ICD-10-CM

## 2012-03-12 DIAGNOSIS — Z8679 Personal history of other diseases of the circulatory system: Secondary | ICD-10-CM | POA: Insufficient documentation

## 2012-03-12 DIAGNOSIS — F309 Manic episode, unspecified: Secondary | ICD-10-CM | POA: Insufficient documentation

## 2012-03-12 DIAGNOSIS — Z86718 Personal history of other venous thrombosis and embolism: Secondary | ICD-10-CM | POA: Insufficient documentation

## 2012-03-12 DIAGNOSIS — Z794 Long term (current) use of insulin: Secondary | ICD-10-CM | POA: Insufficient documentation

## 2012-03-12 DIAGNOSIS — Z862 Personal history of diseases of the blood and blood-forming organs and certain disorders involving the immune mechanism: Secondary | ICD-10-CM | POA: Insufficient documentation

## 2012-03-12 DIAGNOSIS — R21 Rash and other nonspecific skin eruption: Secondary | ICD-10-CM | POA: Insufficient documentation

## 2012-03-12 DIAGNOSIS — Y832 Surgical operation with anastomosis, bypass or graft as the cause of abnormal reaction of the patient, or of later complication, without mention of misadventure at the time of the procedure: Secondary | ICD-10-CM | POA: Insufficient documentation

## 2012-03-12 DIAGNOSIS — Z8639 Personal history of other endocrine, nutritional and metabolic disease: Secondary | ICD-10-CM | POA: Insufficient documentation

## 2012-03-12 DIAGNOSIS — K319 Disease of stomach and duodenum, unspecified: Secondary | ICD-10-CM | POA: Insufficient documentation

## 2012-03-12 DIAGNOSIS — H33009 Unspecified retinal detachment with retinal break, unspecified eye: Secondary | ICD-10-CM | POA: Insufficient documentation

## 2012-03-12 MED ORDER — ACETAMINOPHEN 325 MG PO TABS
650.0000 mg | ORAL_TABLET | Freq: Once | ORAL | Status: AC
  Administered 2012-03-12: 650 mg via ORAL
  Filled 2012-03-12: qty 2

## 2012-03-12 NOTE — ED Notes (Signed)
Pt reports left arm pain at fistula site. Thrill & bruie felt, no bleeding noted, pt states hurts to move arm. Pulses present.

## 2012-03-12 NOTE — ED Notes (Signed)
Called checking on ride was advised [name] was on his way. Called [name] & he states he was on his way.

## 2012-03-12 NOTE — ED Provider Notes (Signed)
History     CSN: [id number]  Arrival date & time 03/12/12  1958   First MD Initiated Contact with Patient 03/12/12 2018      Chief Complaint  Patient presents with  . Vascular Access Problem   HPI Pt is a 27 yo F with PMH of DM type 1, ESRD on HD (T/T/S via LUE AV fistula placed 05/2011), anemia and blindness who presents to ED with left upper extremity pain and swelling since HD today. Pt states during her treatment today she noticed that she had more pain that usual with initial access but it improved. After her treatment she had increased bleeding and required clamping of lines twice. She tolerated the procedure well, but once she was home she noticed she had increased pain in her upper arm and swelling. She denies numbness or tingling. She states she has had a clot in the past but no other problems with the fistula.   Past Medical History  Diagnosis Date  . 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 Hypertension   . Hyperlipidemia   . Nephrotic syndrome   . Anemia     Chronic,  Dr. Mariel Sleet,  Transfused, May, 2012  . DVT (deep venous thrombosis)     Bilateral . coumadin stopped b/c of bilateral retinal hemorrhages.  . Hypertrophic cardiomyopathy     Diastolic dysfunction  . Legally blind     Bilateral hemorrhagic retinal detachments  . Vaginosis   . History of MRSA infection     Multiple  . Glaucoma(365)   . Dialysis patient     M-W-F  . Acute pyelonephritis   . Medical non-compliance   . Manic depressive disorder   . 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 Anemia of renal disease   . Seizures   . 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 Drug-seeking behavior 12/28/2011    Past Surgical History  Procedure Date  . Left arm cyst removal   . Incise and drain abcess 04/24/11    Multiple MRSA - March 2013 drain port of abcess  . Placement of port a cath     Poor venous access  . Av  fistula placement   . Hematoma evacuation 05/25/2011    Procedure: EVACUATION HEMATOMA;  Surgeon: Sherren Kerns, MD;  Location: [facility] OR;  Service: Vascular;  Laterality: Right;  Evacuation Hematoma Right Arm  . Port-a-cath removal 07/04/2011    Procedure: MINOR REMOVAL PORT-A-CATH;  Surgeon: Dalia Heading, MD;  Location: [facility] ORS;  Service: General;  Laterality: Left;  Removal Port-A-Cath/Removal Dialysis Catheter in Minor Room  . Tee without cardioversion 10/29/2011    Procedure: TRANSESOPHAGEAL ECHOCARDIOGRAM (TEE);  Surgeon: Kathlen Brunswick, MD;  Location: [facility] ENDO SUITE;  Service: Cardiovascular;  Laterality: N/A;  . Thrombectomy and revision of arterioventous (av) goretex  graft   . Port-a-cath removal 12/30/2011    Procedure: MINOR REMOVAL PORT-A-CATH;  Surgeon: Fabio Bering, MD;  Location: [facility] ORS;  Service: General;  Laterality: N/A;    Family History  Problem Relation Age of Onset  . Diabetes Mother   . Hypertension Mother   . Diabetes Father     History  Substance Use Topics  . Smoking status: Never Smoker   . Smokeless tobacco: Not on file  . Alcohol Use: No    OB History    Grav Para Term Preterm Abortions TAB SAB Ect Mult  Living                  Review of Systems  Constitutional: Negative for fever, chills and appetite change.  HENT: Negative for neck pain.   Eyes: Positive for visual disturbance (blind at baseline).  Respiratory: Negative for chest tightness and shortness of breath.   Cardiovascular: Negative for chest pain.  Gastrointestinal: Negative for nausea, vomiting and abdominal pain.  Musculoskeletal: Positive for myalgias.  Skin: Positive for rash (small bumps left upper extremity).  Neurological: Negative for headaches.  Hematological: Does not bruise/bleed easily.  All other systems reviewed and are negative.    Allergies  Ancef; Coumadin; Morphine and related; Sulfa antibiotics; and Tomato  Home Medications   Current Outpatient Rx  Name   Route  Sig  Dispense  Refill  . BENAZEPRIL HCL 10 MG PO TABS   Oral   Take 10 mg by mouth daily.         Marland Kitchen CLONIDINE HCL 0.1 MG/24HR TD PTWK   Transdermal   Place 1 patch (0.1 mg total) onto the skin once a week. For treatment of high blood pressure.   4 patch   3   . FLUOXETINE HCL 40 MG PO CAPS   Oral   Take 40 mg by mouth daily.         Marland Kitchen GABAPENTIN 300 MG PO CAPS   Oral   Take 300 mg by mouth daily as needed. For treatment of nerve pain in the legs.         . INSULIN GLARGINE 100 UNIT/ML [place] SOLN   Subcutaneous   Inject 25 Units into the skin at bedtime.         . INSULIN LISPRO (HUMAN) 100 UNIT/ML [place] SOLN   Subcutaneous   Inject 5-20 Units into the skin 3 (three) times daily before meals. Per sliding scale. Pt takes anywhere from 5 to 20 units.         Marland Kitchen LABETALOL HCL 300 MG PO TABS   Oral   Take 300 mg by mouth 2 (two) times daily.         Marland Kitchen RENA-VITE PO TABS   Oral   Take 1 tablet by mouth daily.         Marland Kitchen SEVELAMER CARBONATE 800 MG PO TABS   Oral   Take 1,600 mg by mouth 3 (three) times daily with meals.          . TRAZODONE HCL 100 MG PO TABS   Oral   Take 100 mg by mouth at bedtime.           BP 174/97  Pulse 104  Temp 98.8 F (37.1 C) (Oral)  Resp 18  Ht 5\' 8"  (1.727 m)  Wt 145 lb (65.772 kg)  BMI 22.05 kg/m2  SpO2 99%  LMP 02/27/2012  Physical Exam  Constitutional: She appears well-developed and well-nourished. No distress.  HENT:  Head: Normocephalic and atraumatic.  Mouth/Throat: Oropharynx is clear and moist.  Eyes:       Bilateral cloudy appearance of eyes R>L  Cardiovascular: Normal rate, regular rhythm, normal heart sounds and intact distal pulses.        Palpable thrill of AV fistula LUE with strong bruit  Pulmonary/Chest: Effort normal and breath sounds normal. She has no wheezes.  Abdominal: Soft. There is no tenderness.  Musculoskeletal: Normal range of motion. She exhibits no edema.       Slight swelling of  left upper arm compared  to right, but overall arm is soft/not firm with good distal pulses.  Neurological: She is alert. No cranial nerve deficit.  Skin: Skin is warm and dry. Rash (Fine pustular rash of left shoulder with associated dry skin) noted. She is not diaphoretic.    ED Course  Procedures (including critical care time)  Labs Reviewed - No data to display No results found.   1. Pain of left upper arm   2. A-V fistula      MDM  27 yo F with complex PMH presenting with LUE swelling and pain around AV fistula after HD today  No current bleeding, significant swelling or firmness. Good thrill and distal pulses. Most likely secondary to bleeding at HD today, but appears to be no acute concerning process.Given precautions including keeping arm elevated, use ise as needed for pain relief and follow up with primary doctor.    Hilarie Fredrickson, MD 03/12/12 2107

## 2012-03-12 NOTE — ED Notes (Signed)
Pt from home arrived by EMS. Pt had dialysis today. Site had to be clamped twice at facility. Reporting pain & swelling to her left arm around graft site.

## 2012-03-12 NOTE — ED Notes (Signed)
Cindie Laroche called @ [phone] to pick pt up.

## 2012-03-12 NOTE — ED Notes (Signed)
Ice pack provided pt.

## 2012-03-12 NOTE — ED Provider Notes (Signed)
I have personally seen and examined the patient.  I have discussed the plan of care with the resident.  I have reviewed the documentation on PMH/FH/Soc. History.  I have reviewed the documentation of the resident and agree.  Pt well appearing, no distress. Good thrill noted in dialysis access, no erythema/bleeding/induration.  Stable for d/c  Joya Gaskins, MD 03/12/12 2144

## 2012-05-26 DIAGNOSIS — R4182 Altered mental status, unspecified: Secondary | ICD-10-CM

## 2012-06-06 ENCOUNTER — Emergency Department (HOSPITAL_COMMUNITY): Payer: Medicare Other

## 2012-06-06 ENCOUNTER — Emergency Department (HOSPITAL_COMMUNITY)
Admission: EM | Admit: 2012-06-06 | Discharge: 2012-06-06 | Disposition: A | Discharge status: Home / Self Care | Payer: Medicare Other | Attending: Emergency Medicine | Admitting: Emergency Medicine

## 2012-06-06 ENCOUNTER — Encounter (HOSPITAL_COMMUNITY): Payer: Self-pay | Admitting: Emergency Medicine

## 2012-06-06 DIAGNOSIS — F319 Bipolar disorder, unspecified: Secondary | ICD-10-CM | POA: Insufficient documentation

## 2012-06-06 DIAGNOSIS — N186 End stage renal disease: Secondary | ICD-10-CM | POA: Insufficient documentation

## 2012-06-06 DIAGNOSIS — Z79899 Other long term (current) drug therapy: Secondary | ICD-10-CM | POA: Insufficient documentation

## 2012-06-06 DIAGNOSIS — R079 Chest pain, unspecified: Secondary | ICD-10-CM | POA: Insufficient documentation

## 2012-06-06 DIAGNOSIS — Z8669 Personal history of other diseases of the nervous system and sense organs: Secondary | ICD-10-CM | POA: Insufficient documentation

## 2012-06-06 DIAGNOSIS — Z8742 Personal history of other diseases of the female genital tract: Secondary | ICD-10-CM | POA: Insufficient documentation

## 2012-06-06 DIAGNOSIS — Z992 Dependence on renal dialysis: Secondary | ICD-10-CM | POA: Insufficient documentation

## 2012-06-06 DIAGNOSIS — Z86718 Personal history of other venous thrombosis and embolism: Secondary | ICD-10-CM | POA: Insufficient documentation

## 2012-06-06 DIAGNOSIS — Z8679 Personal history of other diseases of the circulatory system: Secondary | ICD-10-CM | POA: Insufficient documentation

## 2012-06-06 DIAGNOSIS — E109 Type 1 diabetes mellitus without complications: Secondary | ICD-10-CM | POA: Insufficient documentation

## 2012-06-06 DIAGNOSIS — R1013 Epigastric pain: Secondary | ICD-10-CM | POA: Insufficient documentation

## 2012-06-06 DIAGNOSIS — Z8639 Personal history of other endocrine, nutritional and metabolic disease: Secondary | ICD-10-CM | POA: Insufficient documentation

## 2012-06-06 DIAGNOSIS — R109 Unspecified abdominal pain: Secondary | ICD-10-CM

## 2012-06-06 DIAGNOSIS — R509 Fever, unspecified: Secondary | ICD-10-CM | POA: Insufficient documentation

## 2012-06-06 DIAGNOSIS — I120 Hypertensive chronic kidney disease with stage 5 chronic kidney disease or end stage renal disease: Secondary | ICD-10-CM | POA: Insufficient documentation

## 2012-06-06 DIAGNOSIS — G40909 Epilepsy, unspecified, not intractable, without status epilepticus: Secondary | ICD-10-CM | POA: Insufficient documentation

## 2012-06-06 DIAGNOSIS — Z87448 Personal history of other diseases of urinary system: Secondary | ICD-10-CM | POA: Insufficient documentation

## 2012-06-06 DIAGNOSIS — Z8614 Personal history of Methicillin resistant Staphylococcus aureus infection: Secondary | ICD-10-CM | POA: Insufficient documentation

## 2012-06-06 DIAGNOSIS — M549 Dorsalgia, unspecified: Secondary | ICD-10-CM | POA: Insufficient documentation

## 2012-06-06 DIAGNOSIS — Z794 Long term (current) use of insulin: Secondary | ICD-10-CM | POA: Insufficient documentation

## 2012-06-06 DIAGNOSIS — Z862 Personal history of diseases of the blood and blood-forming organs and certain disorders involving the immune mechanism: Secondary | ICD-10-CM | POA: Insufficient documentation

## 2012-06-06 DIAGNOSIS — H548 Legal blindness, as defined in USA: Secondary | ICD-10-CM | POA: Insufficient documentation

## 2012-06-06 LAB — CBC WITH DIFFERENTIAL/PLATELET
Basophils Relative: 1 % (ref 0–1)
Eosinophils Absolute: 0.6 10*3/uL (ref 0.0–0.7)
Eosinophils Relative: 9 % — ABNORMAL HIGH (ref 0–5)
HCT: 27.3 % — ABNORMAL LOW (ref 36.0–46.0)
Hemoglobin: 9.2 g/dL — ABNORMAL LOW (ref 12.0–15.0)
Lymphs Abs: 1.7 10*3/uL (ref 0.7–4.0)
MCH: 32.4 pg (ref 26.0–34.0)
MCHC: 33.7 g/dL (ref 30.0–36.0)
MCV: 96.1 fL (ref 78.0–100.0)
Monocytes Absolute: 0.4 10*3/uL (ref 0.1–1.0)
Monocytes Relative: 5 % (ref 3–12)
RBC: 2.84 MIL/uL — ABNORMAL LOW (ref 3.87–5.11)

## 2012-06-06 LAB — BASIC METABOLIC PANEL
BUN: 65 mg/dL — ABNORMAL HIGH (ref 6–23)
Calcium: 8.9 mg/dL (ref 8.4–10.5)
Creatinine, Ser: 10.86 mg/dL — ABNORMAL HIGH (ref 0.50–1.10)
GFR calc Af Amer: 5 mL/min — ABNORMAL LOW (ref >90)
GFR calc non Af Amer: 4 mL/min — ABNORMAL LOW (ref >90)
Glucose, Bld: 291 mg/dL — ABNORMAL HIGH (ref 70–99)
Potassium: 5.6 mEq/L — ABNORMAL HIGH (ref 3.5–5.1)

## 2012-06-06 LAB — GLUCOSE, CAPILLARY: Glucose-Capillary: 316 mg/dL — ABNORMAL HIGH (ref 70–99)

## 2012-06-06 MED ORDER — DIPHENHYDRAMINE HCL 25 MG PO CAPS
25.0000 mg | ORAL_CAPSULE | Freq: Once | ORAL | Status: AC
  Administered 2012-06-06: 25 mg via ORAL
  Filled 2012-06-06: qty 1

## 2012-06-06 MED ORDER — HYDROMORPHONE HCL PF 1 MG/ML IJ SOLN
1.0000 mg | Freq: Once | INTRAMUSCULAR | Status: AC
  Administered 2012-06-06: 1 mg via INTRAMUSCULAR
  Filled 2012-06-06: qty 1

## 2012-06-06 MED ORDER — HYDROMORPHONE HCL PF 1 MG/ML IJ SOLN: 1.0000 mg | Freq: Once | INTRAMUSCULAR | Status: DC

## 2012-06-06 MED ORDER — ONDANSETRON 8 MG PO TBDP
8.0000 mg | ORAL_TABLET | Freq: Once | ORAL | Status: AC
Start: 2012-06-06 — End: 2012-06-06
  Administered 2012-06-06: 8 mg via ORAL
  Filled 2012-06-06: qty 1

## 2012-06-06 MED ORDER — DIPHENHYDRAMINE HCL 50 MG/ML IJ SOLN: 12.5000 mg | Freq: Once | INTRAMUSCULAR | Status: DC

## 2012-06-06 MED ORDER — SODIUM CHLORIDE 0.9 % IV SOLN: Freq: Once | INTRAVENOUS | Status: DC

## 2012-06-06 MED ORDER — ONDANSETRON HCL 4 MG/2ML IJ SOLN: 4.0000 mg | Freq: Once | INTRAMUSCULAR | Status: DC

## 2012-06-06 NOTE — ED Notes (Addendum)
Difficult to understand patient as she "mumbles" when she talks, keeps her eyes closed.  C/O abdominal pain, last BM was Thursday morning.  Has a caregiver with her who she states has stepped out of the room temporarily.

## 2012-06-06 NOTE — ED Notes (Addendum)
Attempted x 2 for IV start,  Veins sclerotic, did not thread.  2nd IV vein ruptured prior to advancing the catheter.  Warm compresses applied to right arm.

## 2012-06-06 NOTE — ED Provider Notes (Signed)
History     CSN: [id number]  Arrival date & time 06/06/12  0001   First MD Initiated Contact with Patient 06/06/12 0107      Chief Complaint  Patient presents with  . Abdominal Pain  . Fever    (Consider location/radiation/quality/duration/timing/severity/associated sxs/prior treatment) HPI [name] is a 27 y.o. female  With a h/o ESRD on dialysis, HTN, who presents to the Emergency Department complaining of back pain that began several days ago.  She has had sweats, stomach pain and back pain. She is on dialysis Tu-Th-Sa and has had seizures the last two times she was on dialysis. She was begun on dilantin and keppra. She currently has a Quinton catheter for dialysis. She denies nausea, vomiting, shortness of breath.  PCP Dr. Olena Leatherwood Past Medical History  Diagnosis Date  . 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 Hypertension   . Hyperlipidemia   . Nephrotic syndrome   . Anemia     Chronic,  Dr. Mariel Sleet,  Transfused, May, 2012  . DVT (deep venous thrombosis)     Bilateral . coumadin stopped b/c of bilateral retinal hemorrhages.  . Hypertrophic cardiomyopathy     Diastolic dysfunction  . Legally blind     Bilateral hemorrhagic retinal detachments  . Vaginosis   . History of MRSA infection     Multiple  . Glaucoma(365)   . Dialysis patient     M-W-F  . Acute pyelonephritis   . Medical non-compliance   . Manic depressive disorder   . 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 Anemia of renal disease   . Seizures   . 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 Drug-seeking behavior 12/28/2011    Past Surgical History  Procedure Laterality Date  . Left arm cyst removal    . Incise and drain abcess  04/24/11    Multiple MRSA - March 2013 drain port of abcess  . Placement of port a cath      Poor venous access  . Av fistula placement    . Hematoma evacuation  05/25/2011   Procedure: EVACUATION HEMATOMA;  Surgeon: Sherren Kerns, MD;  Location: [facility] OR;  Service: Vascular;  Laterality: Right;  Evacuation Hematoma Right Arm  . Port-a-cath removal  07/04/2011    Procedure: MINOR REMOVAL PORT-A-CATH;  Surgeon: Dalia Heading, MD;  Location: [facility] ORS;  Service: General;  Laterality: Left;  Removal Port-A-Cath/Removal Dialysis Catheter in Minor Room  . Tee without cardioversion  10/29/2011    Procedure: TRANSESOPHAGEAL ECHOCARDIOGRAM (TEE);  Surgeon: Kathlen Brunswick, MD;  Location: [facility] ENDO SUITE;  Service: Cardiovascular;  Laterality: N/A;  . Thrombectomy and revision of arterioventous (av) goretex  graft    . Port-a-cath removal  12/30/2011    Procedure: MINOR REMOVAL PORT-A-CATH;  Surgeon: Fabio Bering, MD;  Location: [facility] ORS;  Service: General;  Laterality: N/A;    Family History  Problem Relation Age of Onset  . Diabetes Mother   . Hypertension Mother   . Diabetes Father     History  Substance Use Topics  . Smoking status: Never Smoker   . Smokeless tobacco: Not on file  . Alcohol Use: No    OB History   Grav Para Term Preterm Abortions TAB SAB Ect Mult Living                  Review of Systems  Constitutional: Negative for fever.       10 Systems reviewed and are negative for acute change except as noted in the HPI.  HENT: Negative for congestion.   Eyes: Negative for discharge and redness.  Respiratory: Negative for cough and shortness of breath.   Cardiovascular: Positive for chest pain.  Gastrointestinal: Negative for vomiting and abdominal pain.  Musculoskeletal: Positive for back pain.  Skin: Negative for rash.  Neurological: Negative for syncope, numbness and headaches.  Psychiatric/Behavioral:       No behavior change.    Allergies  Ancef; Coumadin; Morphine and related; Sulfa antibiotics; and Tomato  Home Medications   Current Outpatient Rx  Name  Route  Sig  Dispense  Refill  . levETIRAcetam (KEPPRA XR) 500 MG 24 hr tablet    Oral   Take 500 mg by mouth daily.         . phenytoin (DILANTIN) 100 MG ER capsule   Oral   Take 100 mg by mouth.         . benazepril (LOTENSIN) 10 MG tablet   Oral   Take 10 mg by mouth daily.         . cloNIDine (CATAPRES - DOSED IN MG/24 HR) 0.1 mg/24hr patch   Transdermal   Place 1 patch (0.1 mg total) onto the skin once a week. For treatment of high blood pressure.   4 patch   3   . FLUoxetine (PROZAC) 40 MG capsule   Oral   Take 40 mg by mouth daily.         Marland Kitchen gabapentin (NEURONTIN) 300 MG capsule   Oral   Take 300 mg by mouth daily as needed. For treatment of nerve pain in the legs.         . insulin glargine (LANTUS) 100 UNIT/ML injection   Subcutaneous   Inject 25 Units into the skin at bedtime.         . insulin lispro (HUMALOG) 100 UNIT/ML injection   Subcutaneous   Inject 5-20 Units into the skin 3 (three) times daily before meals. Per sliding scale. Pt takes anywhere from 5 to 20 units.         Marland Kitchen labetalol (NORMODYNE) 300 MG tablet   Oral   Take 300 mg by mouth 2 (two) times daily.         . multivitamin (RENA-VIT) TABS tablet   Oral   Take 1 tablet by mouth daily.         . sevelamer (RENVELA) 800 MG tablet   Oral   Take 1,600 mg by mouth 3 (three) times daily with meals.          . traZODone (DESYREL) 100 MG tablet   Oral   Take 100 mg by mouth at bedtime.           BP 176/103  Pulse 117  Temp(Src) 99.2 F (37.3 C) (Oral)  Resp 18  Ht 5\' 9"  (1.753 m)  Wt 145 lb (65.772 kg)  BMI 21.4 kg/m2  SpO2 98%  Physical Exam  Nursing note and vitals reviewed. Constitutional: She appears well-developed and well-nourished.  Awake, alert, nontoxic appearance.  HENT:  Head: Normocephalic and atraumatic.  Eyes: EOM are normal. Pupils are equal, round, and reactive to light.  Neck: Normal range of motion. Neck supple.  Cardiovascular: Normal rate and intact distal pulses.   Pulmonary/Chest: Effort normal. No respiratory  distress. She has no wheezes. She exhibits no tenderness.  Quinton cath  Abdominal: Soft. Bowel sounds are normal. There is no tenderness. There is no rebound.  Musculoskeletal: She exhibits no tenderness.  Baseline ROM, no obvious new focal weakness.No pain with palpation of her back.  Neurological:  Mental status and motor strength appears baseline for patient and situation.  Skin: No rash noted.  Psychiatric: She has a normal mood and affect.    ED Course  Procedures (including critical care time) Results for orders placed during the hospital encounter of 06/06/12  GLUCOSE, CAPILLARY      Result Value Range   Glucose-Capillary 316 (*) 70 - 99 mg/dL  CBC WITH DIFFERENTIAL      Result Value Range   WBC 6.8  4.0 - 10.5 K/uL   RBC 2.84 (*) 3.87 - 5.11 MIL/uL   Hemoglobin 9.2 (*) 12.0 - 15.0 g/dL   HCT 45.4 (*) 09.8 - 11.9 %   MCV 96.1  78.0 - 100.0 fL   MCH 32.4  26.0 - 34.0 pg   MCHC 33.7  30.0 - 36.0 g/dL   RDW 14.7 (*) 82.9 - 56.2 %   Platelets 305  150 - 400 K/uL   Neutrophils Relative 60  43 - 77 %   Neutro Abs 4.1  1.7 - 7.7 K/uL   Lymphocytes Relative 25  12 - 46 %   Lymphs Abs 1.7  0.7 - 4.0 K/uL   Monocytes Relative 5  3 - 12 %   Monocytes Absolute 0.4  0.1 - 1.0 K/uL   Eosinophils Relative 9 (*) 0 - 5 %   Eosinophils Absolute 0.6  0.0 - 0.7 K/uL   Basophils Relative 1  0 - 1 %   Basophils Absolute 0.0  0.0 - 0.1 K/uL  BASIC METABOLIC PANEL      Result Value Range   Sodium 131 (*) 135 - 145 mEq/L   Potassium 5.6 (*) 3.5 - 5.1 mEq/L   Chloride 92 (*) 96 - 112 mEq/L   CO2 21  19 - 32 mEq/L   Glucose, Bld 291 (*) 70 - 99 mg/dL   BUN 65 (*) 6 - 23 mg/dL   Creatinine, Ser 13.08 (*) 0.50 - 1.10 mg/dL   Calcium 8.9  8.4 - 65.7 mg/dL   GFR calc non Af Amer 4 (*) >90 mL/min   GFR calc Af Amer 5 (*) >90 mL/min   Dg Chest Port 1 View  06/06/2012  *RADIOLOGY REPORT*  Clinical Data: Chest pain  PORTABLE CHEST - 1 VIEW  Comparison: Prior chest x-ray 06/04/2012  Findings:  Right IJ approach hemodialysis catheter.  The catheter tip is high in the right brachiocephalic vein versus upper SVC. This is unchanged compared to the recent prior exam.  Mild cardiomegaly with left ventricular prominence is similar.  The lungs are negative for edema, focal consolidation, pneumothorax or pleural effusion.  There is mild pulmonary vascular congestion.  IMPRESSION: No acute cardiopulmonary disease and no significant interval change compared to 06/04/2012.   Original Report Authenticated By: Malachy Moan, M.D.    Medications  HYDROmorphone (DILAUDID) injection 1 mg (1 mg Intramuscular Given 06/06/12 0331)  ondansetron (ZOFRAN-ODT) disintegrating tablet 8 mg (8 mg Oral Given 06/06/12 0331)  diphenhydrAMINE (BENADRYL) capsule 25 mg (25 mg Oral Given 06/06/12 0331)    MDM  Patient with back pain and abdominal pain, on dialysis with recent seizures. Labs with stable anemia, ESRD. Chest xray with mild pulmonary congestion. Reviewed results with patient.Pt stable in ED with no significant deterioration in condition.The patient appears reasonably  screened and/or stabilized for discharge and I doubt any other medical condition or other [facility] requiring further screening, evaluation, or treatment in the ED at this time prior to discharge.  MDM Reviewed: nursing note and vitals Interpretation: labs and x-ray           Nicoletta Dress. Colon Branch, MD 06/06/12 1610

## 2012-06-06 NOTE — ED Notes (Addendum)
Pt reports having "hot and cold sweats and intense stomach and chest pain."  Pt indicates pain area as epigastric.  Denies vomiting, reports some nausea.  Pt reports being a dialysis pt, with a schedule of T, TH and S.  States that she is due for dialysis tomorrow.  States that she was seen at [facility] on Tuesday following a seizure while on dialysis.  She was started on dilantin and keppra at that time.  Pt states she tried to have her treatment on Thursday, but started feeling sick again and had a seizure so was taken off early.

## 2012-06-06 NOTE — ED Notes (Signed)
Very drowsy - caregiver with her  Has not been with her very long - states she does not know all of her medications.

## 2012-06-08 ENCOUNTER — Ambulatory Visit: Payer: Self-pay | Admitting: Vascular Surgery

## 2012-06-08 LAB — BASIC METABOLIC PANEL
Anion Gap: 12 (ref 7–16)
BUN: 85 mg/dL — ABNORMAL HIGH (ref 7–18)
Calcium, Total: 9.1 mg/dL (ref 8.5–10.1)
Chloride: 100 mmol/L (ref 98–107)
EGFR (African American): 4 — ABNORMAL LOW
EGFR (Non-African Amer.): 3 — ABNORMAL LOW
Glucose: 215 mg/dL — ABNORMAL HIGH (ref 65–99)
Sodium: 133 mmol/L — ABNORMAL LOW (ref 136–145)

## 2012-06-09 ENCOUNTER — Emergency Department (HOSPITAL_COMMUNITY)
Admission: EM | Admit: 2012-06-09 | Discharge: 2012-06-09 | Disposition: A | Discharge status: Home / Self Care | Payer: Medicare Other | Attending: Emergency Medicine | Admitting: Emergency Medicine

## 2012-06-09 ENCOUNTER — Encounter (HOSPITAL_COMMUNITY): Payer: Self-pay | Admitting: Emergency Medicine

## 2012-06-09 ENCOUNTER — Emergency Department (HOSPITAL_COMMUNITY): Payer: Medicare Other

## 2012-06-09 DIAGNOSIS — Z8639 Personal history of other endocrine, nutritional and metabolic disease: Secondary | ICD-10-CM | POA: Insufficient documentation

## 2012-06-09 DIAGNOSIS — E875 Hyperkalemia: Secondary | ICD-10-CM | POA: Insufficient documentation

## 2012-06-09 DIAGNOSIS — Z862 Personal history of diseases of the blood and blood-forming organs and certain disorders involving the immune mechanism: Secondary | ICD-10-CM | POA: Insufficient documentation

## 2012-06-09 DIAGNOSIS — G40909 Epilepsy, unspecified, not intractable, without status epilepticus: Secondary | ICD-10-CM | POA: Insufficient documentation

## 2012-06-09 DIAGNOSIS — Z8679 Personal history of other diseases of the circulatory system: Secondary | ICD-10-CM | POA: Insufficient documentation

## 2012-06-09 DIAGNOSIS — N186 End stage renal disease: Secondary | ICD-10-CM | POA: Insufficient documentation

## 2012-06-09 DIAGNOSIS — Z9119 Patient's noncompliance with other medical treatment and regimen: Secondary | ICD-10-CM | POA: Insufficient documentation

## 2012-06-09 DIAGNOSIS — N039 Chronic nephritic syndrome with unspecified morphologic changes: Secondary | ICD-10-CM | POA: Insufficient documentation

## 2012-06-09 DIAGNOSIS — Z992 Dependence on renal dialysis: Secondary | ICD-10-CM | POA: Insufficient documentation

## 2012-06-09 DIAGNOSIS — Z8614 Personal history of Methicillin resistant Staphylococcus aureus infection: Secondary | ICD-10-CM | POA: Insufficient documentation

## 2012-06-09 DIAGNOSIS — Z794 Long term (current) use of insulin: Secondary | ICD-10-CM | POA: Insufficient documentation

## 2012-06-09 DIAGNOSIS — H409 Unspecified glaucoma: Secondary | ICD-10-CM | POA: Insufficient documentation

## 2012-06-09 DIAGNOSIS — Z91199 Patient's noncompliance with other medical treatment and regimen due to unspecified reason: Secondary | ICD-10-CM | POA: Insufficient documentation

## 2012-06-09 DIAGNOSIS — K3184 Gastroparesis: Secondary | ICD-10-CM | POA: Insufficient documentation

## 2012-06-09 DIAGNOSIS — Z79899 Other long term (current) drug therapy: Secondary | ICD-10-CM | POA: Insufficient documentation

## 2012-06-09 DIAGNOSIS — I120 Hypertensive chronic kidney disease with stage 5 chronic kidney disease or end stage renal disease: Secondary | ICD-10-CM | POA: Insufficient documentation

## 2012-06-09 DIAGNOSIS — Z87448 Personal history of other diseases of urinary system: Secondary | ICD-10-CM | POA: Insufficient documentation

## 2012-06-09 DIAGNOSIS — E1149 Type 2 diabetes mellitus with other diabetic neurological complication: Secondary | ICD-10-CM | POA: Insufficient documentation

## 2012-06-09 DIAGNOSIS — Z8719 Personal history of other diseases of the digestive system: Secondary | ICD-10-CM | POA: Insufficient documentation

## 2012-06-09 DIAGNOSIS — Z8619 Personal history of other infectious and parasitic diseases: Secondary | ICD-10-CM | POA: Insufficient documentation

## 2012-06-09 DIAGNOSIS — D631 Anemia in chronic kidney disease: Secondary | ICD-10-CM | POA: Insufficient documentation

## 2012-06-09 DIAGNOSIS — Z86718 Personal history of other venous thrombosis and embolism: Secondary | ICD-10-CM | POA: Insufficient documentation

## 2012-06-09 DIAGNOSIS — H548 Legal blindness, as defined in USA: Secondary | ICD-10-CM | POA: Insufficient documentation

## 2012-06-09 DIAGNOSIS — F319 Bipolar disorder, unspecified: Secondary | ICD-10-CM | POA: Insufficient documentation

## 2012-06-09 LAB — LIPASE, BLOOD: Lipase: 55 U/L (ref 11–59)

## 2012-06-09 LAB — CBC WITH DIFFERENTIAL/PLATELET
Basophils Relative: 0 % (ref 0–1)
Eosinophils Absolute: 0.9 10*3/uL — ABNORMAL HIGH (ref 0.0–0.7)
Eosinophils Relative: 11 % — ABNORMAL HIGH (ref 0–5)
Lymphs Abs: 2.2 10*3/uL (ref 0.7–4.0)
MCH: 32.1 pg (ref 26.0–34.0)
MCHC: 33.5 g/dL (ref 30.0–36.0)
MCV: 95.9 fL (ref 78.0–100.0)
Platelets: 316 10*3/uL (ref 150–400)
RBC: 2.68 MIL/uL — ABNORMAL LOW (ref 3.87–5.11)

## 2012-06-09 LAB — COMPREHENSIVE METABOLIC PANEL
ALT: 12 U/L (ref 0–35)
Albumin: 3.2 g/dL — ABNORMAL LOW (ref 3.5–5.2)
BUN: 83 mg/dL — ABNORMAL HIGH (ref 6–23)
Calcium: 8.3 mg/dL — ABNORMAL LOW (ref 8.4–10.5)
GFR calc Af Amer: 4 mL/min — ABNORMAL LOW (ref >90)
Glucose, Bld: 131 mg/dL — ABNORMAL HIGH (ref 70–99)
Sodium: 135 mEq/L (ref 135–145)
Total Protein: 8.3 g/dL (ref 6.0–8.3)

## 2012-06-09 MED ORDER — KETOROLAC TROMETHAMINE 60 MG/2ML IM SOLN
60.0000 mg | Freq: Once | INTRAMUSCULAR | Status: DC
  Filled 2012-06-09: qty 2

## 2012-06-09 MED ORDER — SODIUM POLYSTYRENE SULFONATE 15 GM/60ML PO SUSP
45.0000 g | Freq: Once | ORAL | Status: AC
  Administered 2012-06-09: 45 g via ORAL
  Filled 2012-06-09: qty 180

## 2012-06-09 NOTE — ED Notes (Signed)
Patient is a dialysis patient that went to [facility] today to have her graft declotted today.  States procedure was not completed due to high K+.  Patient states she has been diaphoretic all day and requested to come to [facility] for further evaluation.

## 2012-06-09 NOTE — ED Provider Notes (Signed)
History     CSN: [id number]  Arrival date & time 06/09/12  0049   First MD Initiated Contact with Patient 06/09/12 0157      Chief complaint: Hyperkalemia   The history is provided by the patient.   patient reports she was at another hospital today for a surgical procedure and she was noted to have elevated potassium.  She is in stage renal disease.  She dialyzes Tuesday Thursday Saturday.  She may Saturdays dialysis and therefore she is requesting Kayexalate.  She denies chest pain or shortness of breath.  She denies orthopnea or dyspnea on exertion.  She states she may Saturdays pollicis because she had a seizure.  She says she's compliant with her and to a seizure medicines.  She denies abdominal pain nausea vomiting or diarrhea.  She reports she feels like she's had chills and fever today.  Past Medical History  Diagnosis Date  . 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 Hypertension   . Hyperlipidemia   . Nephrotic syndrome   . Anemia     Chronic,  Dr. Mariel Sleet,  Transfused, May, 2012  . DVT (deep venous thrombosis)     Bilateral . coumadin stopped b/c of bilateral retinal hemorrhages.  . Hypertrophic cardiomyopathy     Diastolic dysfunction  . Legally blind     Bilateral hemorrhagic retinal detachments  . Vaginosis   . History of MRSA infection     Multiple  . Glaucoma(365)   . Dialysis patient     M-W-F  . Acute pyelonephritis   . Medical non-compliance   . Manic depressive disorder   . 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 Anemia of renal disease   . Seizures   . 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 Drug-seeking behavior 12/28/2011    Past Surgical History  Procedure Laterality Date  . Left arm cyst removal    . Incise and drain abcess  04/24/11    Multiple MRSA - March 2013 drain port of abcess  . Placement of port a cath      Poor venous access  . Av fistula placement     . Hematoma evacuation  05/25/2011    Procedure: EVACUATION HEMATOMA;  Surgeon: Sherren Kerns, MD;  Location: [facility] OR;  Service: Vascular;  Laterality: Right;  Evacuation Hematoma Right Arm  . Port-a-cath removal  07/04/2011    Procedure: MINOR REMOVAL PORT-A-CATH;  Surgeon: Dalia Heading, MD;  Location: [facility] ORS;  Service: General;  Laterality: Left;  Removal Port-A-Cath/Removal Dialysis Catheter in Minor Room  . Tee without cardioversion  10/29/2011    Procedure: TRANSESOPHAGEAL ECHOCARDIOGRAM (TEE);  Surgeon: Kathlen Brunswick, MD;  Location: [facility] ENDO SUITE;  Service: Cardiovascular;  Laterality: N/A;  . Thrombectomy and revision of arterioventous (av) goretex  graft    . Port-a-cath removal  12/30/2011    Procedure: MINOR REMOVAL PORT-A-CATH;  Surgeon: Fabio Bering, MD;  Location: [facility] ORS;  Service: General;  Laterality: N/A;    Family History  Problem Relation Age of Onset  . Diabetes Mother   . Hypertension Mother   . Diabetes Father     History  Substance Use Topics  . Smoking status: Never Smoker   . Smokeless tobacco: Not on file  . Alcohol Use: No    OB History   Grav Para Term Preterm Abortions TAB SAB Ect Mult Living  Review of Systems  All other systems reviewed and are negative.    Allergies  Ancef; Coumadin; Morphine and related; Sulfa antibiotics; and Tomato  Home Medications   Current Outpatient Rx  Name  Route  Sig  Dispense  Refill  . benazepril (LOTENSIN) 10 MG tablet   Oral   Take 10 mg by mouth daily.         . cloNIDine (CATAPRES - DOSED IN MG/24 HR) 0.1 mg/24hr patch   Transdermal   Place 1 patch (0.1 mg total) onto the skin once a week. For treatment of high blood pressure.   4 patch   3   . FLUoxetine (PROZAC) 40 MG capsule   Oral   Take 40 mg by mouth daily. Needs refill         . gabapentin (NEURONTIN) 300 MG capsule   Oral   Take 300 mg by mouth daily as needed. For treatment of nerve pain in the legs.          . insulin glargine (LANTUS) 100 UNIT/ML injection   Subcutaneous   Inject 25 Units into the skin at bedtime.         . insulin lispro (HUMALOG) 100 UNIT/ML injection   Subcutaneous   Inject 5-20 Units into the skin 3 (three) times daily before meals. Per sliding scale. Pt takes anywhere from 5 to 20 units.         Marland Kitchen labetalol (NORMODYNE) 300 MG tablet   Oral   Take 300 mg by mouth 2 (two) times daily.         Marland Kitchen levETIRAcetam (KEPPRA XR) 500 MG 24 hr tablet   Oral   Take 500 mg by mouth daily.         . multivitamin (RENA-VIT) TABS tablet   Oral   Take 1 tablet by mouth daily.         . phenytoin (DILANTIN) 100 MG ER capsule   Oral   Take 100 mg by mouth.         . sevelamer (RENVELA) 800 MG tablet   Oral   Take 1,600 mg by mouth 3 (three) times daily with meals.          . traZODone (DESYREL) 100 MG tablet   Oral   Take 100 mg by mouth at bedtime.           BP 149/89  Pulse 117  Temp(Src) 99.1 F (37.3 C) (Oral)  Ht 5\' 9"  (1.753 m)  Wt 150 lb (68.04 kg)  BMI 22.14 kg/m2  SpO2 99%  LMP 05/26/2012  Physical Exam  Nursing note and vitals reviewed. Constitutional: She is oriented to person, place, and time. She appears well-developed and well-nourished. No distress.  HENT:  Head: Normocephalic and atraumatic.  Eyes: EOM are normal.  Neck: Normal range of motion.  Cardiovascular: Normal rate, regular rhythm and normal heart sounds.   Pulmonary/Chest: Effort normal and breath sounds normal.  Abdominal: Soft. She exhibits no distension. There is no tenderness.  Musculoskeletal: Normal range of motion.  Neurological: She is alert and oriented to person, place, and time.  Skin: Skin is warm and dry.  Psychiatric: She has a normal mood and affect. Judgment normal.    ED Course  Procedures (including critical care time)  Apiration of blood/fluid Performed by: Lyanne Co Consent obtained. Required items: required blood products, implants,  devices, and special equipment available Patient identity confirmed: verbally with patient Time out: Immediately prior to procedure a "time  out" was called to verify the correct patient, procedure, equipment, support staff and site/side marked as required. Preparation: Patient was prepped and draped in the usual sterile fashion. Patient tolerance: Patient tolerated the procedure well with no immediate complications. Location of aspiration: right EJ   Date: 06/09/2012  Rate: 113  Rhythm: sinus tachycardia  QRS Axis: normal  Intervals: normal  ST/T Wave abnormalities: normal  Conduction Disutrbances: none  Narrative Interpretation:   Old EKG Reviewed: No significant changes noted      Labs Reviewed  CBC WITH DIFFERENTIAL - Abnormal; Notable for the following:    RBC 2.68 (*)    Hemoglobin 8.6 (*)    HCT 25.7 (*)    RDW 16.3 (*)    Eosinophils Relative 11 (*)    Eosinophils Absolute 0.9 (*)    All other components within normal limits  COMPREHENSIVE METABOLIC PANEL - Abnormal; Notable for the following:    Potassium 6.6 (*)    Chloride 95 (*)    Glucose, Bld 131 (*)    BUN 83 (*)    Creatinine, Ser 14.36 (*)    Calcium 8.3 (*)    Albumin 3.2 (*)    Alkaline Phosphatase 251 (*)    Total Bilirubin 0.2 (*)    GFR calc non Af Amer 3 (*)    GFR calc Af Amer 4 (*)    All other components within normal limits  PHENYTOIN LEVEL, TOTAL - Abnormal; Notable for the following:    Phenytoin Lvl 4.3 (*)    All other components within normal limits  CULTURE, BLOOD (ROUTINE X 2)  CULTURE, BLOOD (ROUTINE X 2)  LIPASE, BLOOD   No results found.   1. Hyperkalemia   2. ESRD (end stage renal disease) on dialysis       MDM  Patient's potassium is 6.6.  She shows no signs of volume overload clinically.  She's lying flat in bed and sleeping.  Patient be given Kayexalate and she was told she will need her dialysis today.  She states she will make this.  I've asked her to please not  miss this dialysis appointment.  She states she has transportation and should not be an issue.  I've encouraged the patient to return the emergency department for any new or worsening symptoms.  I told her that it Dilantin level was low and she states she will readjust her medications at home.  She reports vertigo home at this time        Lyanne Co, MD 06/09/12 [phone]

## 2012-06-09 NOTE — ED Notes (Addendum)
According to xray tech, pt would not lay still for xray.

## 2012-06-09 NOTE — ED Notes (Signed)
Pt drank all of kayexalate. Bedside commode by the bed, pt given red socks, and call bell.

## 2012-06-09 NOTE — ED Notes (Signed)
Pt called mother to come pick her up. Mother unable to. Mother was going to see if her sister could pick her up.

## 2012-06-09 NOTE — ED Notes (Signed)
EDP notified that pt refused toradol

## 2012-06-14 LAB — CULTURE, BLOOD (ROUTINE X 2)

## 2012-06-23 ENCOUNTER — Telehealth: Payer: Self-pay | Admitting: Orthopedic Surgery

## 2012-06-23 NOTE — Telephone Encounter (Signed)
Patient called following treatment at [facility] Emergency Room for fracture of foot, requests appointment - states prefers to come to our office Vs. [facility]  Advised patient of the need to contact Medical Records department at [facility] to request physician notes, Xray report, and Xray film, as well as contacting primary care physician, Dr. Olena Leatherwood, regarding referral, per secondary insurance requirement.  Patient states will call back following requesting this information.  Her phone # is [phone].

## 2012-06-24 NOTE — Telephone Encounter (Signed)
Referral received today, 06/24/12 from primary care physician Dr. Olena Leatherwood; appointment has been scheduled.  Spoke with patient. Patient aware.

## 2012-07-06 ENCOUNTER — Ambulatory Visit (INDEPENDENT_AMBULATORY_CARE_PROVIDER_SITE_OTHER): Payer: Medicare Other | Admitting: Orthopedic Surgery

## 2012-07-06 ENCOUNTER — Encounter: Payer: Self-pay | Admitting: Orthopedic Surgery

## 2012-07-06 VITALS — BP 124/88 | Ht 68.0 in | Wt 150.0 lb

## 2012-07-06 DIAGNOSIS — E11610 Type 2 diabetes mellitus with diabetic neuropathic arthropathy: Secondary | ICD-10-CM | POA: Insufficient documentation

## 2012-07-06 MED ORDER — HYDROCODONE-ACETAMINOPHEN 5-325 MG PO TABS
1.0000 | ORAL_TABLET | Freq: Four times a day (QID) | ORAL | Status: AC | PRN
Start: 2012-07-06 — End: ?

## 2012-07-06 NOTE — Patient Instructions (Signed)
Referral for home PT [facility] with walker   Refer back to Dr Hasani/ she needs referral to [facility]   No follow up appointments

## 2012-07-06 NOTE — Progress Notes (Signed)
Patient ID: [name], female   DOB: Jun 12, 1985, 27 y.o.   MRN: [id number] Consult has been requested to evaluate the patient for possible fractures of her left foot Chief Complaint  Patient presents with  . Foot Pain    Fracture left foot    The consult was requested by Dr.Hasanaj  The patient denies any trauma she only notes an increase in swelling and then pain with weightbearing which she rates at 10 she rates it as constant the swelling resolves partially with elevation and nonweightbearing her symptoms are worse when she is walking  She was seen in the emergency room in 02/13/2012 x-rays were taken at that time and then again another set of x-rays were taken on 06/20/2012 and those x-rays do indeed show progressive neuropathic arthropathy and this was not noted on her initial film.  The patient was declared legally blind approximately 2 years ago she denies all other symptoms reviewed. She is currently disabled she is single she has multiple drug allergies including sulfa, Ancef, Coumadin, morphine.  Past Medical History  Diagnosis Date  . 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 Hypertension   . Hyperlipidemia   . Nephrotic syndrome   . Anemia     Chronic,  Dr. Mariel Sleet,  Transfused, May, 2012  . DVT (deep venous thrombosis)     Bilateral . coumadin stopped b/c of bilateral retinal hemorrhages.  . Hypertrophic cardiomyopathy     Diastolic dysfunction  . Legally blind     Bilateral hemorrhagic retinal detachments  . Vaginosis   . History of MRSA infection     Multiple  . Glaucoma   . Dialysis patient     M-W-F  . Acute pyelonephritis   . Medical non-compliance   . Manic depressive disorder   . 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 Anemia of renal disease   . Seizures   . 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 Drug-seeking behavior 12/28/2011    Past Surgical History   Procedure Laterality Date  . Left arm cyst removal    . Incise and drain abcess  04/24/11    Multiple MRSA - March 2013 drain port of abcess  . Placement of port a cath      Poor venous access  . Av fistula placement    . Hematoma evacuation  05/25/2011    Procedure: EVACUATION HEMATOMA;  Surgeon: Sherren Kerns, MD;  Location: [facility] OR;  Service: Vascular;  Laterality: Right;  Evacuation Hematoma Right Arm  . Port-a-cath removal  07/04/2011    Procedure: MINOR REMOVAL PORT-A-CATH;  Surgeon: Dalia Heading, MD;  Location: [facility] ORS;  Service: General;  Laterality: Left;  Removal Port-A-Cath/Removal Dialysis Catheter in Minor Room  . Tee without cardioversion  10/29/2011    Procedure: TRANSESOPHAGEAL ECHOCARDIOGRAM (TEE);  Surgeon: Kathlen Brunswick, MD;  Location: [facility] ENDO SUITE;  Service: Cardiovascular;  Laterality: N/A;  . Thrombectomy and revision of arterioventous (av) goretex  graft    . Port-a-cath removal  12/30/2011    Procedure: MINOR REMOVAL PORT-A-CATH;  Surgeon: Fabio Bering, MD;  Location: [facility] ORS;  Service: General;  Laterality: N/A;    BP 124/88  Ht 5\' 8"  (1.727 m)  Wt 150 lb (68.04 kg)  BMI 22.81 kg/m2  LMP 05/26/2012 She is maintaining good weight she is without deformity, nutritional status appears good. She appears oriented times person place  and time her mood and affect is flat and depressed. She ambulates with assistance and required a wheelchair to get in to our exam room although she came in without that to the office  She is a swollen warm foot normal range of motion at the ankle no crepitance. Ankle joint appears stable. Muscle tone normal. Skin intact with multiple areas which look like pressure sores on both pretibial areas she has a palpable dorsalis pedis pulse her foot is warm to touch  She feels pressure and says that it does hurt when the foot is palpated dorsally.  X-ray show Charcot changes  Recommend nonweightbearing, home health therapy, foot and ankle  consultation to be arranged by primary care physician. We gave her some Norco for pain The patient will follow up with her primary care doctor and they will make arrangements for further referrals to foot and ankle specialist

## 2012-07-07 ENCOUNTER — Other Ambulatory Visit: Payer: Self-pay | Admitting: Orthopedic Surgery

## 2012-07-07 ENCOUNTER — Telehealth: Payer: Self-pay | Admitting: Orthopedic Surgery

## 2012-07-07 DIAGNOSIS — E11610 Type 2 diabetes mellitus with diabetic neuropathic arthropathy: Secondary | ICD-10-CM

## 2012-07-07 NOTE — Telephone Encounter (Signed)
Ok done

## 2012-07-07 NOTE — Telephone Encounter (Signed)
Called back to patient/caregiver and left voice message, done; faxed to [facility].

## 2012-07-07 NOTE — Telephone Encounter (Signed)
Routing to Dr Harrison 

## 2012-07-07 NOTE — Telephone Encounter (Signed)
Patient's aid, Brigitte Pulse, called to relay that she had taken patient to [facility] to fill the walker prescription, and was advised by [employer] to have it filled at [facility] and states it will "need to be more specific" than "walker", and will also need diagnosis information.  Asking if we can take care of this and fax it to [facility].  Patient aid phone [phone].

## 2012-07-08 NOTE — Telephone Encounter (Signed)
Informed patient

## 2012-07-08 NOTE — Telephone Encounter (Signed)
Update:  07/08/12 - Patient had called to ask if she can have the Walker delivered, if [place] can order it. Following re-fax of order to [facility], received call back from [place] - states that are not on the local network provider list for [employer]; advised to try [facility].  Called [facility], verified with Abby that they can provide the walker, and also deliver it.  Order faxed.

## 2012-07-09 ENCOUNTER — Telehealth: Payer: Self-pay | Admitting: Orthopedic Surgery

## 2012-07-09 NOTE — Telephone Encounter (Signed)
[facility] social worker, Katrina Ketring asked if you will order a wheelchair for [employer].  She thinks the wheelchair will be better for Talasia based on her visual impairment and fractured foot. She has another doctor's appointment this week and she thinks the wheelchair will be better  Since she cannot put weight on her foot.  Katrina's # [phone]

## 2012-07-09 NOTE — Telephone Encounter (Signed)
Advised of doctor's reply °

## 2012-07-09 NOTE — Telephone Encounter (Signed)
Call the primary care doctor for all future questions

## 2012-09-09 ENCOUNTER — Inpatient Hospital Stay: Payer: Self-pay | Admitting: Internal Medicine

## 2012-09-09 LAB — PHENYTOIN LEVEL, TOTAL: Dilantin: 11.3 ug/mL (ref 10.0–20.0)

## 2012-09-09 LAB — CBC WITH DIFFERENTIAL/PLATELET
Basophil #: 0.1 10*3/uL (ref 0.0–0.1)
Eosinophil %: 3.7 %
HCT: 38.1 % (ref 35.0–47.0)
HGB: 13.1 g/dL (ref 12.0–16.0)
Lymphocyte #: 1.7 10*3/uL (ref 1.0–3.6)
MCHC: 34.5 g/dL (ref 32.0–36.0)
Monocyte %: 12.4 %
Neutrophil #: 4.3 10*3/uL (ref 1.4–6.5)
Platelet: 293 10*3/uL (ref 150–440)
RBC: 3.75 10*6/uL — ABNORMAL LOW (ref 3.80–5.20)
WBC: 7.1 10*3/uL (ref 3.6–11.0)

## 2012-09-09 LAB — BASIC METABOLIC PANEL
BUN: 43 mg/dL — ABNORMAL HIGH (ref 7–18)
Chloride: 98 mmol/L (ref 98–107)
Potassium: 4.6 mmol/L (ref 3.5–5.1)

## 2012-09-10 LAB — BASIC METABOLIC PANEL
BUN: 51 mg/dL — ABNORMAL HIGH (ref 7–18)
Calcium, Total: 9.2 mg/dL (ref 8.5–10.1)
Chloride: 99 mmol/L (ref 98–107)
Creatinine: 10.25 mg/dL — ABNORMAL HIGH (ref 0.60–1.30)
EGFR (African American): 5 — ABNORMAL LOW
Glucose: 113 mg/dL — ABNORMAL HIGH (ref 65–99)
Osmolality: 286 (ref 275–301)
Potassium: 4.6 mmol/L (ref 3.5–5.1)

## 2012-09-10 LAB — HEMOGLOBIN A1C: Hemoglobin A1C: 6 % (ref 4.2–6.3)

## 2012-09-10 LAB — LIPID PANEL
Cholesterol: 148 mg/dL (ref 0–200)
HDL Cholesterol: 58 mg/dL (ref 40–60)
Ldl Cholesterol, Calc: 51 mg/dL (ref 0–100)
Triglycerides: 195 mg/dL (ref 0–200)
VLDL Cholesterol, Calc: 39 mg/dL (ref 5–40)

## 2012-09-10 LAB — CBC WITH DIFFERENTIAL/PLATELET
Basophil #: 0 10*3/uL (ref 0.0–0.1)
MCH: 35.1 pg — ABNORMAL HIGH (ref 26.0–34.0)
Monocyte %: 11.8 %
RDW: 18.9 % — ABNORMAL HIGH (ref 11.5–14.5)
WBC: 5.9 10*3/uL (ref 3.6–11.0)

## 2012-09-10 LAB — PHOSPHORUS: Phosphorus: 7.1 mg/dL — ABNORMAL HIGH (ref 2.5–4.9)

## 2012-09-10 LAB — MAGNESIUM: Magnesium: 2.4 mg/dL

## 2012-09-11 LAB — BASIC METABOLIC PANEL
Anion Gap: 9 (ref 7–16)
BUN: 38 mg/dL — ABNORMAL HIGH (ref 7–18)
Calcium, Total: 10 mg/dL (ref 8.5–10.1)
Creatinine: 7.72 mg/dL — ABNORMAL HIGH (ref 0.60–1.30)
EGFR (African American): 8 — ABNORMAL LOW
EGFR (Non-African Amer.): 7 — ABNORMAL LOW
Glucose: 123 mg/dL — ABNORMAL HIGH (ref 65–99)
Potassium: 4.7 mmol/L (ref 3.5–5.1)
Sodium: 133 mmol/L — ABNORMAL LOW (ref 136–145)

## 2012-09-11 LAB — PHOSPHORUS: Phosphorus: 6.3 mg/dL — ABNORMAL HIGH (ref 2.5–4.9)

## 2012-09-11 LAB — HCG, QUANTITATIVE, PREGNANCY: Beta Hcg, Quant.: 4 m[IU]/mL — ABNORMAL HIGH

## 2012-09-12 LAB — CBC WITH DIFFERENTIAL/PLATELET
Basophil %: 0.5 %
Eosinophil #: 0.3 10*3/uL (ref 0.0–0.7)
HCT: 34.5 % — ABNORMAL LOW (ref 35.0–47.0)
Lymphocyte #: 1.9 10*3/uL (ref 1.0–3.6)
Lymphocyte %: 29.2 %
Neutrophil #: 3.6 10*3/uL (ref 1.4–6.5)
RDW: 18.9 % — ABNORMAL HIGH (ref 11.5–14.5)

## 2012-09-12 LAB — RENAL FUNCTION PANEL
Albumin: 2.8 g/dL — ABNORMAL LOW (ref 3.4–5.0)
Co2: 29 mmol/L (ref 21–32)
EGFR (African American): 5 — ABNORMAL LOW
Glucose: 166 mg/dL — ABNORMAL HIGH (ref 65–99)
Osmolality: 280 (ref 275–301)
Phosphorus: 6.6 mg/dL — ABNORMAL HIGH (ref 2.5–4.9)
Potassium: 5.4 mmol/L — ABNORMAL HIGH (ref 3.5–5.1)
Sodium: 131 mmol/L — ABNORMAL LOW (ref 136–145)

## 2012-09-13 ENCOUNTER — Emergency Department: Payer: Self-pay | Admitting: Emergency Medicine

## 2012-09-14 LAB — CBC
MCH: 35.3 pg — ABNORMAL HIGH (ref 26.0–34.0)
MCV: 103 fL — ABNORMAL HIGH (ref 80–100)
Platelet: 201 10*3/uL (ref 150–440)
RBC: 3.67 10*6/uL — ABNORMAL LOW (ref 3.80–5.20)
RDW: 18.7 % — ABNORMAL HIGH (ref 11.5–14.5)

## 2012-09-14 LAB — COMPREHENSIVE METABOLIC PANEL
Bilirubin,Total: 0.3 mg/dL (ref 0.2–1.0)
Calcium, Total: 9.2 mg/dL (ref 8.5–10.1)
Chloride: 96 mmol/L — ABNORMAL LOW (ref 98–107)
Co2: 29 mmol/L (ref 21–32)
Creatinine: 7.93 mg/dL — ABNORMAL HIGH (ref 0.60–1.30)
EGFR (Non-African Amer.): 6 — ABNORMAL LOW
Glucose: 114 mg/dL — ABNORMAL HIGH (ref 65–99)
Osmolality: 281 (ref 275–301)
Potassium: 5.3 mmol/L — ABNORMAL HIGH (ref 3.5–5.1)
SGPT (ALT): 26 U/L (ref 12–78)
Total Protein: 8.9 g/dL — ABNORMAL HIGH (ref 6.4–8.2)

## 2012-09-14 LAB — LIPASE, BLOOD: Lipase: 371 U/L (ref 73–393)

## 2012-09-15 LAB — WOUND CULTURE

## 2012-09-20 ENCOUNTER — Inpatient Hospital Stay: Payer: Self-pay | Admitting: Family Medicine

## 2012-09-20 LAB — CBC
HGB: 12.4 g/dL (ref 12.0–16.0)
MCH: 34.4 pg — ABNORMAL HIGH (ref 26.0–34.0)
MCHC: 34 g/dL (ref 32.0–36.0)
Platelet: 280 10*3/uL (ref 150–440)
RBC: 3.6 10*6/uL — ABNORMAL LOW (ref 3.80–5.20)
RDW: 18.2 % — ABNORMAL HIGH (ref 11.5–14.5)
WBC: 6.1 10*3/uL (ref 3.6–11.0)

## 2012-09-20 LAB — COMPREHENSIVE METABOLIC PANEL
Alkaline Phosphatase: 295 U/L — ABNORMAL HIGH (ref 50–136)
Anion Gap: 5 — ABNORMAL LOW (ref 7–16)
BUN: 22 mg/dL — ABNORMAL HIGH (ref 7–18)
Bilirubin,Total: 0.3 mg/dL (ref 0.2–1.0)
Calcium, Total: 9.4 mg/dL (ref 8.5–10.1)
Chloride: 100 mmol/L (ref 98–107)
Co2: 32 mmol/L (ref 21–32)
EGFR (Non-African Amer.): 8 — ABNORMAL LOW
Glucose: 112 mg/dL — ABNORMAL HIGH (ref 65–99)
Osmolality: 278 (ref 275–301)
Potassium: 4.3 mmol/L (ref 3.5–5.1)
SGOT(AST): 47 U/L — ABNORMAL HIGH (ref 15–37)
SGPT (ALT): 30 U/L (ref 12–78)
Sodium: 137 mmol/L (ref 136–145)
Total Protein: 8.7 g/dL — ABNORMAL HIGH (ref 6.4–8.2)

## 2012-09-21 LAB — CBC WITH DIFFERENTIAL/PLATELET
Basophil #: 0.1 10*3/uL (ref 0.0–0.1)
Eosinophil #: 0.3 10*3/uL (ref 0.0–0.7)
HCT: 33.7 % — ABNORMAL LOW (ref 35.0–47.0)
HGB: 11.4 g/dL — ABNORMAL LOW (ref 12.0–16.0)
Lymphocyte %: 34 %
MCH: 34.1 pg — ABNORMAL HIGH (ref 26.0–34.0)
MCHC: 33.7 g/dL (ref 32.0–36.0)
MCV: 101 fL — ABNORMAL HIGH (ref 80–100)
Monocyte #: 0.6 x10 3/mm (ref 0.2–0.9)
Neutrophil %: 47.1 %
Platelet: 236 10*3/uL (ref 150–440)
RDW: 18 % — ABNORMAL HIGH (ref 11.5–14.5)
WBC: 4.8 10*3/uL (ref 3.6–11.0)

## 2012-09-21 LAB — BASIC METABOLIC PANEL
BUN: 28 mg/dL — ABNORMAL HIGH (ref 7–18)
Calcium, Total: 9.1 mg/dL (ref 8.5–10.1)
EGFR (African American): 8 — ABNORMAL LOW
Potassium: 4.7 mmol/L (ref 3.5–5.1)
Sodium: 139 mmol/L (ref 136–145)

## 2012-09-22 LAB — VANCOMYCIN, RANDOM: Vancomycin, Random: >50 ug/mL

## 2012-09-22 LAB — PHOSPHORUS: Phosphorus: 6.7 mg/dL — ABNORMAL HIGH (ref 2.5–4.9)

## 2012-09-24 LAB — CBC WITH DIFFERENTIAL/PLATELET
Basophil %: 3.6 %
Eosinophil %: 7.1 %
HCT: 31.3 % — ABNORMAL LOW (ref 35.0–47.0)
MCHC: 34 g/dL (ref 32.0–36.0)
Monocyte #: 0.4 x10 3/mm (ref 0.2–0.9)
Monocyte %: 6.9 %
Neutrophil %: 46.7 %
Platelet: 217 10*3/uL (ref 150–440)
RBC: 3.09 10*6/uL — ABNORMAL LOW (ref 3.80–5.20)
RDW: 17.6 % — ABNORMAL HIGH (ref 11.5–14.5)
WBC: 5.4 10*3/uL (ref 3.6–11.0)

## 2012-09-24 LAB — PHOSPHORUS: Phosphorus: 5.6 mg/dL — ABNORMAL HIGH (ref 2.5–4.9)

## 2012-09-26 LAB — URINALYSIS, COMPLETE
Glucose,UR: 100 mg/dL (ref 0–75)
Protein: 150
RBC,UR: 291 /HPF (ref 0–5)
Specific Gravity: 1.005 (ref 1.003–1.030)
WBC UR: 29 /HPF (ref 0–5)

## 2012-09-26 LAB — CULTURE, BLOOD (SINGLE)

## 2012-09-27 ENCOUNTER — Ambulatory Visit: Payer: Self-pay

## 2012-09-27 LAB — URINALYSIS, COMPLETE
Bilirubin,UR: NEGATIVE
Ph: 8 (ref 4.5–8.0)
Protein: 150
RBC,UR: 3 /HPF (ref 0–5)
Squamous Epithelial: 13

## 2012-09-27 LAB — BASIC METABOLIC PANEL
Calcium, Total: 8.8 mg/dL (ref 8.5–10.1)
Co2: 30 mmol/L (ref 21–32)
Creatinine: 5.54 mg/dL — ABNORMAL HIGH (ref 0.60–1.30)
EGFR (Non-African Amer.): 10 — ABNORMAL LOW
Potassium: 5 mmol/L (ref 3.5–5.1)
Sodium: 134 mmol/L — ABNORMAL LOW (ref 136–145)

## 2012-09-28 LAB — URINE CULTURE

## 2012-09-29 LAB — URINE CULTURE

## 2012-10-05 LAB — CBC WITH DIFFERENTIAL/PLATELET
MCH: 33.8 pg (ref 26.0–34.0)
MCHC: 33.7 g/dL (ref 32.0–36.0)
MCV: 100 fL (ref 80–100)
RBC: 3.01 10*6/uL — ABNORMAL LOW (ref 3.80–5.20)
WBC: 5 10*3/uL (ref 3.6–11.0)

## 2012-10-05 LAB — BASIC METABOLIC PANEL
Anion Gap: 8 (ref 7–16)
Calcium, Total: 8.7 mg/dL (ref 8.5–10.1)
Chloride: 100 mmol/L (ref 98–107)
Co2: 28 mmol/L (ref 21–32)
Creatinine: 8.06 mg/dL — ABNORMAL HIGH (ref 0.60–1.30)
EGFR (African American): 7 — ABNORMAL LOW
Potassium: 4.1 mmol/L (ref 3.5–5.1)
Sodium: 136 mmol/L (ref 136–145)

## 2012-10-05 LAB — PRO B NATRIURETIC PEPTIDE: B-Type Natriuretic Peptide: 4822 pg/mL — ABNORMAL HIGH (ref 0–125)

## 2012-10-06 ENCOUNTER — Observation Stay: Payer: Self-pay | Admitting: Student

## 2012-10-13 ENCOUNTER — Inpatient Hospital Stay: Payer: Self-pay | Admitting: Internal Medicine

## 2012-10-13 LAB — COMPREHENSIVE METABOLIC PANEL
Alkaline Phosphatase: 323 U/L — ABNORMAL HIGH (ref 50–136)
Bilirubin,Total: 0.3 mg/dL (ref 0.2–1.0)
Co2: 25 mmol/L (ref 21–32)
Creatinine: 9.25 mg/dL — ABNORMAL HIGH (ref 0.60–1.30)
EGFR (African American): 6 — ABNORMAL LOW
EGFR (Non-African Amer.): 5 — ABNORMAL LOW
Osmolality: 283 (ref 275–301)
Potassium: 4.8 mmol/L (ref 3.5–5.1)
SGPT (ALT): 24 U/L (ref 12–78)

## 2012-10-13 LAB — CBC
HCT: 28.7 % — ABNORMAL LOW (ref 35.0–47.0)
HGB: 9.7 g/dL — ABNORMAL LOW (ref 12.0–16.0)
MCH: 34.5 pg — ABNORMAL HIGH (ref 26.0–34.0)
MCHC: 33.8 g/dL (ref 32.0–36.0)
MCV: 102 fL — ABNORMAL HIGH (ref 80–100)
Platelet: 249 10*3/uL (ref 150–440)
RBC: 2.81 10*6/uL — ABNORMAL LOW (ref 3.80–5.20)
WBC: 6.2 10*3/uL (ref 3.6–11.0)

## 2012-10-13 LAB — PRO B NATRIURETIC PEPTIDE: B-Type Natriuretic Peptide: 8755 pg/mL — ABNORMAL HIGH (ref 0–125)

## 2012-10-13 LAB — TROPONIN I: Troponin-I: <0.02 ng/mL

## 2012-10-13 LAB — PHOSPHORUS: Phosphorus: 5 mg/dL — ABNORMAL HIGH (ref 2.5–4.9)

## 2012-10-13 LAB — CK TOTAL AND CKMB (NOT AT ARMC): CK, Total: 235 U/L — ABNORMAL HIGH (ref 21–215)

## 2012-10-14 LAB — CBC WITH DIFFERENTIAL/PLATELET
Basophil #: 0.1 10*3/uL (ref 0.0–0.1)
Basophil %: 0.9 %
Eosinophil %: 4 %
HCT: 31.1 % — ABNORMAL LOW (ref 35.0–47.0)
HGB: 10.5 g/dL — ABNORMAL LOW (ref 12.0–16.0)
Lymphocyte #: 1.6 10*3/uL (ref 1.0–3.6)
Lymphocyte %: 26.2 %
MCH: 34.3 pg — ABNORMAL HIGH (ref 26.0–34.0)
MCV: 102 fL — ABNORMAL HIGH (ref 80–100)
Monocyte #: 0.4 x10 3/mm (ref 0.2–0.9)
Monocyte %: 6.8 %
Neutrophil #: 3.8 10*3/uL (ref 1.4–6.5)
Neutrophil %: 62.1 %
Platelet: 258 10*3/uL (ref 150–440)
RBC: 3.05 10*6/uL — ABNORMAL LOW (ref 3.80–5.20)

## 2012-10-14 LAB — TROPONIN I: Troponin-I: <0.02 ng/mL

## 2012-10-14 LAB — BASIC METABOLIC PANEL
Anion Gap: 5 — ABNORMAL LOW (ref 7–16)
Calcium, Total: 9 mg/dL (ref 8.5–10.1)
Chloride: 98 mmol/L (ref 98–107)
Co2: 32 mmol/L (ref 21–32)
Glucose: 108 mg/dL — ABNORMAL HIGH (ref 65–99)
Osmolality: 275 (ref 275–301)
Potassium: 4.4 mmol/L (ref 3.5–5.1)

## 2012-10-15 LAB — PHOSPHORUS: Phosphorus: 5.1 mg/dL — ABNORMAL HIGH (ref 2.5–4.9)

## 2012-10-18 LAB — WOUND CULTURE

## 2012-10-18 LAB — CULTURE, BLOOD (SINGLE)

## 2012-11-03 ENCOUNTER — Inpatient Hospital Stay: Payer: Self-pay | Admitting: Internal Medicine

## 2012-11-03 LAB — CBC
MCH: 34.1 pg — ABNORMAL HIGH (ref 26.0–34.0)
RBC: 3.06 10*6/uL — ABNORMAL LOW (ref 3.80–5.20)
RDW: 18.3 % — ABNORMAL HIGH (ref 11.5–14.5)
WBC: 4 10*3/uL (ref 3.6–11.0)

## 2012-11-03 LAB — COMPREHENSIVE METABOLIC PANEL
Albumin: 3.3 g/dL — ABNORMAL LOW (ref 3.4–5.0)
Alkaline Phosphatase: 288 U/L — ABNORMAL HIGH (ref 50–136)
Anion Gap: 4 — ABNORMAL LOW (ref 7–16)
BUN: 11 mg/dL (ref 7–18)
Bilirubin,Total: 0.5 mg/dL (ref 0.2–1.0)
Co2: 34 mmol/L — ABNORMAL HIGH (ref 21–32)
Creatinine: 4.25 mg/dL — ABNORMAL HIGH (ref 0.60–1.30)
EGFR (Non-African Amer.): 14 — ABNORMAL LOW
Glucose: 85 mg/dL (ref 65–99)
SGOT(AST): 28 U/L (ref 15–37)
SGPT (ALT): 18 U/L (ref 12–78)
Sodium: 137 mmol/L (ref 136–145)

## 2012-11-03 LAB — TROPONIN I: Troponin-I: 0.05 ng/mL

## 2012-11-04 LAB — URINALYSIS, COMPLETE
Bilirubin,UR: NEGATIVE
Glucose,UR: 150 mg/dL (ref 0–75)
Protein: >=500
Squamous Epithelial: 18

## 2012-11-04 LAB — CK TOTAL AND CKMB (NOT AT ARMC)
CK, Total: 116 U/L (ref 21–215)
CK-MB: 2.2 ng/mL (ref 0.5–3.6)

## 2012-11-04 LAB — TROPONIN I
Troponin-I: 0.05 ng/mL
Troponin-I: 0.04 ng/mL

## 2012-11-19 ENCOUNTER — Emergency Department: Payer: Self-pay | Admitting: Emergency Medicine

## 2012-11-19 LAB — CBC
MCHC: 33.4 g/dL (ref 32.0–36.0)
MCV: 103 fL — ABNORMAL HIGH (ref 80–100)
Platelet: 344 10*3/uL (ref 150–440)
RBC: 3.61 10*6/uL — ABNORMAL LOW (ref 3.80–5.20)
WBC: 8.8 10*3/uL (ref 3.6–11.0)

## 2012-11-19 LAB — COMPREHENSIVE METABOLIC PANEL
Alkaline Phosphatase: 380 U/L — ABNORMAL HIGH (ref 50–136)
Anion Gap: 11 (ref 7–16)
BUN: 49 mg/dL — ABNORMAL HIGH (ref 7–18)
Bilirubin,Total: 0.3 mg/dL (ref 0.2–1.0)
Chloride: 104 mmol/L (ref 98–107)
Creatinine: 8.39 mg/dL — ABNORMAL HIGH (ref 0.60–1.30)
EGFR (African American): 7 — ABNORMAL LOW
EGFR (Non-African Amer.): 6 — ABNORMAL LOW
Potassium: 4.7 mmol/L (ref 3.5–5.1)
Sodium: 136 mmol/L (ref 136–145)

## 2012-11-19 LAB — TSH: Thyroid Stimulating Horm: 2.65 u[IU]/mL

## 2012-11-25 ENCOUNTER — Emergency Department: Payer: Self-pay | Admitting: Emergency Medicine

## 2012-11-25 LAB — COMPREHENSIVE METABOLIC PANEL
Albumin: 3.3 g/dL — ABNORMAL LOW (ref 3.4–5.0)
Alkaline Phosphatase: 323 U/L — ABNORMAL HIGH (ref 50–136)
Anion Gap: 7 (ref 7–16)
Bilirubin,Total: 0.3 mg/dL (ref 0.2–1.0)
EGFR (African American): 10 — ABNORMAL LOW
EGFR (Non-African Amer.): 9 — ABNORMAL LOW
Glucose: 115 mg/dL — ABNORMAL HIGH (ref 65–99)
Osmolality: 278 (ref 275–301)
Potassium: 4 mmol/L (ref 3.5–5.1)
SGPT (ALT): 16 U/L (ref 12–78)
Sodium: 137 mmol/L (ref 136–145)
Total Protein: 8.4 g/dL — ABNORMAL HIGH (ref 6.4–8.2)

## 2012-11-25 LAB — CBC WITH DIFFERENTIAL/PLATELET
Eosinophil #: 0.2 10*3/uL (ref 0.0–0.7)
HCT: 34.4 % — ABNORMAL LOW (ref 35.0–47.0)
Lymphocyte #: 1.3 10*3/uL (ref 1.0–3.6)
Lymphocyte %: 17.6 %
MCH: 33.4 pg (ref 26.0–34.0)
MCHC: 33.3 g/dL (ref 32.0–36.0)
Monocyte %: 7.4 %
Neutrophil %: 70.9 %
Platelet: 266 10*3/uL (ref 150–440)
RBC: 3.43 10*6/uL — ABNORMAL LOW (ref 3.80–5.20)
RDW: 16.3 % — ABNORMAL HIGH (ref 11.5–14.5)
WBC: 7.3 10*3/uL (ref 3.6–11.0)

## 2012-11-25 LAB — PROTIME-INR: INR: 1

## 2012-11-25 LAB — APTT: Activated PTT: 34.4 secs (ref 23.6–35.9)

## 2012-12-02 ENCOUNTER — Emergency Department: Payer: Self-pay | Admitting: Emergency Medicine

## 2012-12-05 LAB — BETA STREP CULTURE(ARMC)

## 2012-12-22 ENCOUNTER — Emergency Department: Payer: Self-pay | Admitting: Emergency Medicine

## 2012-12-22 LAB — CBC
HCT: 29.8 % — ABNORMAL LOW (ref 35.0–47.0)
HGB: 10 g/dL — ABNORMAL LOW (ref 12.0–16.0)
MCHC: 33.5 g/dL (ref 32.0–36.0)
MCV: 98 fL (ref 80–100)
RBC: 3.04 10*6/uL — ABNORMAL LOW (ref 3.80–5.20)
RDW: 16.3 % — ABNORMAL HIGH (ref 11.5–14.5)
WBC: 5.3 10*3/uL (ref 3.6–11.0)

## 2012-12-22 LAB — HCG, QUANTITATIVE, PREGNANCY: Beta Hcg, Quant.: 4 m[IU]/mL — ABNORMAL HIGH

## 2012-12-22 LAB — BASIC METABOLIC PANEL
Calcium, Total: 8.9 mg/dL (ref 8.5–10.1)
Chloride: 99 mmol/L (ref 98–107)
Creatinine: 11.18 mg/dL — ABNORMAL HIGH (ref 0.60–1.30)
EGFR (African American): 5 — ABNORMAL LOW
Glucose: 112 mg/dL — ABNORMAL HIGH (ref 65–99)
Potassium: 5.1 mmol/L (ref 3.5–5.1)

## 2012-12-22 LAB — PHOSPHORUS: Phosphorus: 7.8 mg/dL — ABNORMAL HIGH (ref 2.5–4.9)

## 2012-12-28 ENCOUNTER — Emergency Department: Payer: Self-pay | Admitting: Emergency Medicine

## 2012-12-28 LAB — CBC
MCHC: 33.2 g/dL (ref 32.0–36.0)
MCV: 98 fL (ref 80–100)
Platelet: 292 10*3/uL (ref 150–440)

## 2012-12-28 LAB — BASIC METABOLIC PANEL
Anion Gap: 8 (ref 7–16)
BUN: 13 mg/dL (ref 7–18)
Chloride: 96 mmol/L — ABNORMAL LOW (ref 98–107)
Co2: 28 mmol/L (ref 21–32)
Creatinine: 5.13 mg/dL — ABNORMAL HIGH (ref 0.60–1.30)
EGFR (Non-African Amer.): 11 — ABNORMAL LOW
Osmolality: 264 (ref 275–301)
Potassium: 3.6 mmol/L (ref 3.5–5.1)

## 2012-12-28 LAB — PHENYTOIN LEVEL, TOTAL: Dilantin: 3.7 ug/mL — ABNORMAL LOW (ref 10.0–20.0)

## 2012-12-31 ENCOUNTER — Emergency Department: Payer: Self-pay | Admitting: Emergency Medicine

## 2012-12-31 LAB — BASIC METABOLIC PANEL
Anion Gap: 6 — ABNORMAL LOW (ref 7–16)
Chloride: 100 mmol/L (ref 98–107)
EGFR (Non-African Amer.): 9 — ABNORMAL LOW
Glucose: 145 mg/dL — ABNORMAL HIGH (ref 65–99)
Potassium: 4.3 mmol/L (ref 3.5–5.1)
Sodium: 136 mmol/L (ref 136–145)

## 2012-12-31 LAB — CBC
HCT: 32.4 % — ABNORMAL LOW (ref 35.0–47.0)
MCHC: 33.1 g/dL (ref 32.0–36.0)
WBC: 5.7 10*3/uL (ref 3.6–11.0)

## 2012-12-31 LAB — PROTIME-INR: Prothrombin Time: 13.1 secs (ref 11.5–14.7)

## 2012-12-31 LAB — MAGNESIUM: Magnesium: 1.8 mg/dL

## 2013-01-18 ENCOUNTER — Inpatient Hospital Stay: Payer: Self-pay | Admitting: Internal Medicine

## 2013-01-18 LAB — COMPREHENSIVE METABOLIC PANEL
Albumin: 3.2 g/dL — ABNORMAL LOW (ref 3.4–5.0)
Anion Gap: 6 — ABNORMAL LOW (ref 7–16)
Chloride: 99 mmol/L (ref 98–107)
Co2: 32 mmol/L (ref 21–32)
Glucose: 79 mg/dL (ref 65–99)
Potassium: 5 mmol/L (ref 3.5–5.1)
SGOT(AST): 26 U/L (ref 15–37)
SGPT (ALT): 16 U/L (ref 12–78)
Sodium: 137 mmol/L (ref 136–145)
Total Protein: 8.6 g/dL — ABNORMAL HIGH (ref 6.4–8.2)

## 2013-01-18 LAB — CBC
HGB: 10.9 g/dL — ABNORMAL LOW (ref 12.0–16.0)
MCH: 31.6 pg (ref 26.0–34.0)
Platelet: 284 10*3/uL (ref 150–440)
RBC: 3.45 10*6/uL — ABNORMAL LOW (ref 3.80–5.20)
RDW: 17.1 % — ABNORMAL HIGH (ref 11.5–14.5)
WBC: 5.5 10*3/uL (ref 3.6–11.0)

## 2013-01-18 LAB — CK TOTAL AND CKMB (NOT AT ARMC)
CK, Total: 150 U/L (ref 21–215)
CK, Total: 117 U/L (ref 21–215)
CK, Total: 106 U/L (ref 21–215)
CK-MB: 2.2 ng/mL (ref 0.5–3.6)
CK-MB: 1.8 ng/mL (ref 0.5–3.6)

## 2013-01-18 LAB — TROPONIN I
Troponin-I: 0.04 ng/mL
Troponin-I: 0.02 ng/mL

## 2013-01-18 LAB — PROTIME-INR: INR: 1.1

## 2013-01-19 LAB — RENAL FUNCTION PANEL
Albumin: 3 g/dL — ABNORMAL LOW (ref 3.4–5.0)
Anion Gap: 5 — ABNORMAL LOW (ref 7–16)
BUN: 43 mg/dL — ABNORMAL HIGH (ref 7–18)
Calcium, Total: 8.8 mg/dL (ref 8.5–10.1)
Chloride: 99 mmol/L (ref 98–107)
Creatinine: 8.86 mg/dL — ABNORMAL HIGH (ref 0.60–1.30)
EGFR (African American): 6 — ABNORMAL LOW
EGFR (Non-African Amer.): 6 — ABNORMAL LOW
Phosphorus: 6.6 mg/dL — ABNORMAL HIGH (ref 2.5–4.9)
Potassium: 6 mmol/L — ABNORMAL HIGH (ref 3.5–5.1)

## 2013-02-16 ENCOUNTER — Inpatient Hospital Stay: Payer: Self-pay | Admitting: Internal Medicine

## 2013-02-16 LAB — CBC WITH DIFFERENTIAL/PLATELET
BASOS PCT: 1.1 %
Basophil #: 0.1 10*3/uL (ref 0.0–0.1)
Basophil #: 0.1 10*3/uL (ref 0.0–0.1)
Basophil %: 1.1 %
Eosinophil #: 0.2 10*3/uL (ref 0.0–0.7)
Eosinophil #: 0.3 10*3/uL (ref 0.0–0.7)
Eosinophil %: 4.4 %
Eosinophil %: 4.8 %
HCT: 31.3 % — AB (ref 35.0–47.0)
HCT: 35.6 % (ref 35.0–47.0)
HGB: 10.4 g/dL — ABNORMAL LOW (ref 12.0–16.0)
HGB: 11.6 g/dL — AB (ref 12.0–16.0)
LYMPHS ABS: 1.5 10*3/uL (ref 1.0–3.6)
LYMPHS PCT: 21.3 %
Lymphocyte #: 1.4 10*3/uL (ref 1.0–3.6)
Lymphocyte %: 30 %
MCH: 31.6 pg (ref 26.0–34.0)
MCH: 31.1 pg (ref 26.0–34.0)
MCHC: 33.1 g/dL (ref 32.0–36.0)
MCHC: 32.5 g/dL (ref 32.0–36.0)
MCV: 95 fL (ref 80–100)
MCV: 96 fL (ref 80–100)
MONO ABS: 0.6 x10 3/mm (ref 0.2–0.9)
Monocyte #: 0.6 x10 3/mm (ref 0.2–0.9)
Monocyte %: 11.8 %
Monocyte %: 9.7 %
NEUTROS ABS: 4.2 10*3/uL (ref 1.4–6.5)
NEUTROS PCT: 52.3 %
Neutrophil #: 2.6 10*3/uL (ref 1.4–6.5)
Neutrophil %: 63.5 %
Platelet: 228 10*3/uL (ref 150–440)
Platelet: 242 10*3/uL (ref 150–440)
RBC: 3.28 10*6/uL — AB (ref 3.80–5.20)
RBC: 3.73 10*6/uL — AB (ref 3.80–5.20)
RDW: 17.5 % — ABNORMAL HIGH (ref 11.5–14.5)
RDW: 17.8 % — ABNORMAL HIGH (ref 11.5–14.5)
WBC: 4.9 10*3/uL (ref 3.6–11.0)
WBC: 6.6 10*3/uL (ref 3.6–11.0)

## 2013-02-16 LAB — COMPREHENSIVE METABOLIC PANEL
ALBUMIN: 3.4 g/dL (ref 3.4–5.0)
ANION GAP: 10 (ref 7–16)
Alkaline Phosphatase: 226 U/L — ABNORMAL HIGH
BILIRUBIN TOTAL: 0.4 mg/dL (ref 0.2–1.0)
BUN: 60 mg/dL — ABNORMAL HIGH (ref 7–18)
CREATININE: 9.25 mg/dL — AB (ref 0.60–1.30)
Calcium, Total: 8.8 mg/dL (ref 8.5–10.1)
Chloride: 98 mmol/L (ref 98–107)
Co2: 26 mmol/L (ref 21–32)
EGFR (African American): 6 — ABNORMAL LOW
EGFR (Non-African Amer.): 5 — ABNORMAL LOW
Glucose: 128 mg/dL — ABNORMAL HIGH (ref 65–99)
Osmolality: 287 (ref 275–301)
Potassium: 6.3 mmol/L — ABNORMAL HIGH (ref 3.5–5.1)
SGOT(AST): 20 U/L (ref 15–37)
SGPT (ALT): 15 U/L (ref 12–78)
Sodium: 134 mmol/L — ABNORMAL LOW (ref 136–145)
TOTAL PROTEIN: 8.8 g/dL — AB (ref 6.4–8.2)

## 2013-02-16 LAB — BASIC METABOLIC PANEL
Anion Gap: 7 (ref 7–16)
BUN: 65 mg/dL — ABNORMAL HIGH (ref 7–18)
CO2: 27 mmol/L (ref 21–32)
CREATININE: 9.3 mg/dL — AB (ref 0.60–1.30)
Calcium, Total: 8.7 mg/dL (ref 8.5–10.1)
Chloride: 99 mmol/L (ref 98–107)
GFR CALC AF AMER: 6 — AB
GFR CALC NON AF AMER: 5 — AB
Glucose: 76 mg/dL (ref 65–99)
Osmolality: 284 (ref 275–301)
Potassium: 5.9 mmol/L — ABNORMAL HIGH (ref 3.5–5.1)
SODIUM: 133 mmol/L — AB (ref 136–145)

## 2013-02-16 LAB — LIPASE, BLOOD: Lipase: 246 U/L (ref 73–393)

## 2013-02-16 LAB — PHOSPHORUS: PHOSPHORUS: 9.8 mg/dL — AB (ref 2.5–4.9)

## 2013-02-17 LAB — LIPASE, BLOOD: Lipase: 271 U/L (ref 73–393)

## 2013-03-11 ENCOUNTER — Emergency Department: Payer: Self-pay | Admitting: Emergency Medicine

## 2013-03-12 ENCOUNTER — Inpatient Hospital Stay: Payer: Self-pay | Admitting: Internal Medicine

## 2013-03-12 LAB — CBC
HCT: 36 % (ref 35.0–47.0)
HCT: 32.5 % — ABNORMAL LOW (ref 35.0–47.0)
HGB: 12.1 g/dL (ref 12.0–16.0)
HGB: 10.8 g/dL — ABNORMAL LOW (ref 12.0–16.0)
MCH: 33.3 pg (ref 26.0–34.0)
MCH: 32.8 pg (ref 26.0–34.0)
MCHC: 33.5 g/dL (ref 32.0–36.0)
MCHC: 33.1 g/dL (ref 32.0–36.0)
MCV: 99 fL (ref 80–100)
MCV: 99 fL (ref 80–100)
Platelet: 235 10*3/uL (ref 150–440)
Platelet: 229 10*3/uL (ref 150–440)
RBC: 3.62 10*6/uL — ABNORMAL LOW (ref 3.80–5.20)
RBC: 3.28 10*6/uL — ABNORMAL LOW (ref 3.80–5.20)
RDW: 19.2 % — ABNORMAL HIGH (ref 11.5–14.5)
RDW: 19 % — ABNORMAL HIGH (ref 11.5–14.5)
WBC: 5.5 10*3/uL (ref 3.6–11.0)
WBC: 5.7 10*3/uL (ref 3.6–11.0)

## 2013-03-12 LAB — COMPREHENSIVE METABOLIC PANEL
ALBUMIN: 3.4 g/dL (ref 3.4–5.0)
ALK PHOS: 234 U/L — AB
ANION GAP: 7 (ref 7–16)
BUN: 25 mg/dL — AB (ref 7–18)
Bilirubin,Total: 0.3 mg/dL (ref 0.2–1.0)
CALCIUM: 7.7 mg/dL — AB (ref 8.5–10.1)
CHLORIDE: 99 mmol/L (ref 98–107)
Co2: 29 mmol/L (ref 21–32)
Creatinine: 6.8 mg/dL — ABNORMAL HIGH (ref 0.60–1.30)
EGFR (African American): 9 — ABNORMAL LOW
GFR CALC NON AF AMER: 8 — AB
Glucose: 95 mg/dL (ref 65–99)
OSMOLALITY: 274 (ref 275–301)
POTASSIUM: 3.9 mmol/L (ref 3.5–5.1)
SGOT(AST): 18 U/L (ref 15–37)
SGPT (ALT): 16 U/L (ref 12–78)
SODIUM: 135 mmol/L — AB (ref 136–145)
Total Protein: 8.6 g/dL — ABNORMAL HIGH (ref 6.4–8.2)

## 2013-03-12 LAB — BASIC METABOLIC PANEL
Anion Gap: 6 — ABNORMAL LOW (ref 7–16)
BUN: 18 mg/dL (ref 7–18)
CO2: 28 mmol/L (ref 21–32)
Calcium, Total: 8.6 mg/dL (ref 8.5–10.1)
Chloride: 97 mmol/L — ABNORMAL LOW (ref 98–107)
Creatinine: 5.11 mg/dL — ABNORMAL HIGH (ref 0.60–1.30)
EGFR (African American): 13 — ABNORMAL LOW
EGFR (Non-African Amer.): 11 — ABNORMAL LOW
Glucose: 98 mg/dL (ref 65–99)
Osmolality: 265 (ref 275–301)
POTASSIUM: 3.8 mmol/L (ref 3.5–5.1)
Sodium: 131 mmol/L — ABNORMAL LOW (ref 136–145)

## 2013-03-12 LAB — CK TOTAL AND CKMB (NOT AT ARMC)
CK, TOTAL: 125 U/L
CK-MB: 2.4 ng/mL (ref 0.5–3.6)

## 2013-03-12 LAB — TROPONIN I: Troponin-I: <0.02 ng/mL

## 2013-03-12 LAB — MAGNESIUM: Magnesium: 1.9 mg/dL

## 2013-03-12 LAB — PROTIME-INR
INR: 1
Prothrombin Time: 13.2 secs (ref 11.5–14.7)

## 2013-03-12 LAB — APTT: Activated PTT: 31.2 secs (ref 23.6–35.9)

## 2013-03-13 LAB — BASIC METABOLIC PANEL
Anion Gap: 6 — ABNORMAL LOW (ref 7–16)
BUN: 30 mg/dL — AB (ref 7–18)
CHLORIDE: 100 mmol/L (ref 98–107)
Calcium, Total: 7.9 mg/dL — ABNORMAL LOW (ref 8.5–10.1)
Co2: 29 mmol/L (ref 21–32)
Creatinine: 7.53 mg/dL — ABNORMAL HIGH (ref 0.60–1.30)
EGFR (African American): 8 — ABNORMAL LOW
EGFR (Non-African Amer.): 7 — ABNORMAL LOW
GLUCOSE: 79 mg/dL (ref 65–99)
Osmolality: 275 (ref 275–301)
Potassium: 3.8 mmol/L (ref 3.5–5.1)
SODIUM: 135 mmol/L — AB (ref 136–145)

## 2013-03-13 LAB — PHOSPHORUS: PHOSPHORUS: 2.6 mg/dL (ref 2.5–4.9)

## 2013-03-18 ENCOUNTER — Inpatient Hospital Stay: Payer: Self-pay | Admitting: Internal Medicine

## 2013-03-18 LAB — HCG, QUANTITATIVE, PREGNANCY: BETA HCG, QUANT.: 5 m[IU]/mL — AB

## 2013-03-18 LAB — COMPREHENSIVE METABOLIC PANEL
ALT: 22 U/L (ref 12–78)
ANION GAP: 10 (ref 7–16)
AST: 18 U/L (ref 15–37)
Albumin: 3.3 g/dL — ABNORMAL LOW (ref 3.4–5.0)
Alkaline Phosphatase: 238 U/L — ABNORMAL HIGH
BILIRUBIN TOTAL: 0.4 mg/dL (ref 0.2–1.0)
BUN: 96 mg/dL — AB (ref 7–18)
Calcium, Total: 7.8 mg/dL — ABNORMAL LOW (ref 8.5–10.1)
Chloride: 94 mmol/L — ABNORMAL LOW (ref 98–107)
Co2: 29 mmol/L (ref 21–32)
Creatinine: 12.39 mg/dL — ABNORMAL HIGH (ref 0.60–1.30)
EGFR (African American): 4 — ABNORMAL LOW
EGFR (Non-African Amer.): 4 — ABNORMAL LOW
GLUCOSE: 100 mg/dL — AB (ref 65–99)
Osmolality: 296 (ref 275–301)
Potassium: 6.2 mmol/L — ABNORMAL HIGH (ref 3.5–5.1)
SODIUM: 133 mmol/L — AB (ref 136–145)
TOTAL PROTEIN: 8.4 g/dL — AB (ref 6.4–8.2)

## 2013-03-18 LAB — PHOSPHORUS: PHOSPHORUS: 7.8 mg/dL — AB (ref 2.5–4.9)

## 2013-03-18 LAB — CBC
HCT: 30.2 % — ABNORMAL LOW (ref 35.0–47.0)
HGB: 10.3 g/dL — AB (ref 12.0–16.0)
MCH: 33.7 pg (ref 26.0–34.0)
MCHC: 34.3 g/dL (ref 32.0–36.0)
MCV: 98 fL (ref 80–100)
Platelet: 233 10*3/uL (ref 150–440)
RBC: 3.06 10*6/uL — AB (ref 3.80–5.20)
RDW: 18.9 % — ABNORMAL HIGH (ref 11.5–14.5)
WBC: 4.8 10*3/uL (ref 3.6–11.0)

## 2013-03-18 LAB — SALICYLATE LEVEL: SALICYLATES, SERUM: 1.8 mg/dL

## 2013-03-18 LAB — LIPASE, BLOOD: Lipase: 99 U/L (ref 73–393)

## 2013-03-19 LAB — BASIC METABOLIC PANEL
Anion Gap: 8 (ref 7–16)
BUN: 53 mg/dL — ABNORMAL HIGH (ref 7–18)
CHLORIDE: 99 mmol/L (ref 98–107)
Calcium, Total: 8.3 mg/dL — ABNORMAL LOW (ref 8.5–10.1)
Co2: 29 mmol/L (ref 21–32)
Creatinine: 8.66 mg/dL — ABNORMAL HIGH (ref 0.60–1.30)
GFR CALC AF AMER: 7 — AB
GFR CALC NON AF AMER: 6 — AB
GLUCOSE: 78 mg/dL (ref 65–99)
OSMOLALITY: 285 (ref 275–301)
Potassium: 4.9 mmol/L (ref 3.5–5.1)
Sodium: 136 mmol/L (ref 136–145)

## 2013-03-20 LAB — BASIC METABOLIC PANEL
Anion Gap: 10 (ref 7–16)
BUN: 68 mg/dL — ABNORMAL HIGH (ref 7–18)
CALCIUM: 8.7 mg/dL (ref 8.5–10.1)
Chloride: 97 mmol/L — ABNORMAL LOW (ref 98–107)
Co2: 27 mmol/L (ref 21–32)
Creatinine: 11.2 mg/dL — ABNORMAL HIGH (ref 0.60–1.30)
EGFR (African American): 5 — ABNORMAL LOW
GFR CALC NON AF AMER: 4 — AB
Glucose: 144 mg/dL — ABNORMAL HIGH (ref 65–99)
Osmolality: 291 (ref 275–301)
POTASSIUM: 5.2 mmol/L — AB (ref 3.5–5.1)
Sodium: 134 mmol/L — ABNORMAL LOW (ref 136–145)

## 2013-03-20 LAB — PHOSPHORUS: Phosphorus: 8.8 mg/dL — ABNORMAL HIGH (ref 2.5–4.9)

## 2013-03-21 LAB — CBC WITH DIFFERENTIAL/PLATELET
BASOS PCT: 1.1 %
Basophil #: 0 10*3/uL (ref 0.0–0.1)
EOS ABS: 0.2 10*3/uL (ref 0.0–0.7)
EOS PCT: 4.3 %
HCT: 30 % — AB (ref 35.0–47.0)
HGB: 9.8 g/dL — ABNORMAL LOW (ref 12.0–16.0)
Lymphocyte #: 1.6 10*3/uL (ref 1.0–3.6)
Lymphocyte %: 36.4 %
MCH: 32.6 pg (ref 26.0–34.0)
MCHC: 32.7 g/dL (ref 32.0–36.0)
MCV: 100 fL (ref 80–100)
MONOS PCT: 10.1 %
Monocyte #: 0.5 x10 3/mm (ref 0.2–0.9)
Neutrophil #: 2.2 10*3/uL (ref 1.4–6.5)
Neutrophil %: 48.1 %
PLATELETS: 252 10*3/uL (ref 150–440)
RBC: 3 10*6/uL — ABNORMAL LOW (ref 3.80–5.20)
RDW: 18 % — AB (ref 11.5–14.5)
WBC: 4.5 10*3/uL (ref 3.6–11.0)

## 2013-03-21 LAB — BASIC METABOLIC PANEL
ANION GAP: 7 (ref 7–16)
BUN: 32 mg/dL — AB (ref 7–18)
CALCIUM: 9.5 mg/dL (ref 8.5–10.1)
CHLORIDE: 95 mmol/L — AB (ref 98–107)
CO2: 31 mmol/L (ref 21–32)
Creatinine: 7.28 mg/dL — ABNORMAL HIGH (ref 0.60–1.30)
EGFR (African American): 8 — ABNORMAL LOW
GFR CALC NON AF AMER: 7 — AB
GLUCOSE: 75 mg/dL (ref 65–99)
Osmolality: 272 (ref 275–301)
Potassium: 4.6 mmol/L (ref 3.5–5.1)
Sodium: 133 mmol/L — ABNORMAL LOW (ref 136–145)

## 2013-03-22 LAB — COMPREHENSIVE METABOLIC PANEL
ALT: 18 U/L (ref 12–78)
Albumin: 2.7 g/dL — ABNORMAL LOW (ref 3.4–5.0)
Alkaline Phosphatase: 213 U/L — ABNORMAL HIGH
Anion Gap: 8 (ref 7–16)
BUN: 37 mg/dL — AB (ref 7–18)
Bilirubin,Total: 0.3 mg/dL (ref 0.2–1.0)
CALCIUM: 8.3 mg/dL — AB (ref 8.5–10.1)
CHLORIDE: 94 mmol/L — AB (ref 98–107)
CREATININE: 9.13 mg/dL — AB (ref 0.60–1.30)
Co2: 29 mmol/L (ref 21–32)
EGFR (African American): 6 — ABNORMAL LOW
EGFR (Non-African Amer.): 5 — ABNORMAL LOW
Glucose: 77 mg/dL (ref 65–99)
OSMOLALITY: 270 (ref 275–301)
Potassium: 4.9 mmol/L (ref 3.5–5.1)
SGOT(AST): 25 U/L (ref 15–37)
Sodium: 131 mmol/L — ABNORMAL LOW (ref 136–145)
Total Protein: 7.3 g/dL (ref 6.4–8.2)

## 2013-03-22 LAB — CBC WITH DIFFERENTIAL/PLATELET
Basophil #: 0 10*3/uL (ref 0.0–0.1)
Basophil %: 0.7 %
EOS ABS: 0.2 10*3/uL (ref 0.0–0.7)
EOS PCT: 3.4 %
HCT: 29.5 % — AB (ref 35.0–47.0)
HGB: 9.9 g/dL — AB (ref 12.0–16.0)
Lymphocyte #: 1.1 10*3/uL (ref 1.0–3.6)
Lymphocyte %: 22.7 %
MCH: 33.1 pg (ref 26.0–34.0)
MCHC: 33.4 g/dL (ref 32.0–36.0)
MCV: 99 fL (ref 80–100)
MONO ABS: 0.4 x10 3/mm (ref 0.2–0.9)
MONOS PCT: 9 %
NEUTROS ABS: 3 10*3/uL (ref 1.4–6.5)
Neutrophil %: 64.2 %
PLATELETS: 273 10*3/uL (ref 150–440)
RBC: 2.98 10*6/uL — ABNORMAL LOW (ref 3.80–5.20)
RDW: 17.2 % — ABNORMAL HIGH (ref 11.5–14.5)
WBC: 4.7 10*3/uL (ref 3.6–11.0)

## 2013-03-23 LAB — CBC WITH DIFFERENTIAL/PLATELET
Basophil #: 0 10*3/uL (ref 0.0–0.1)
Basophil %: 0.4 %
EOS ABS: 0.2 10*3/uL (ref 0.0–0.7)
Eosinophil %: 4.7 %
HCT: 30.1 % — ABNORMAL LOW (ref 35.0–47.0)
HGB: 9.9 g/dL — AB (ref 12.0–16.0)
Lymphocyte #: 0.9 10*3/uL — ABNORMAL LOW (ref 1.0–3.6)
Lymphocyte %: 17.3 %
MCH: 32.6 pg (ref 26.0–34.0)
MCHC: 32.9 g/dL (ref 32.0–36.0)
MCV: 99 fL (ref 80–100)
Monocyte #: 0.4 x10 3/mm (ref 0.2–0.9)
Monocyte %: 8.7 %
NEUTROS PCT: 68.9 %
Neutrophil #: 3.4 10*3/uL (ref 1.4–6.5)
Platelet: 330 10*3/uL (ref 150–440)
RBC: 3.05 10*6/uL — ABNORMAL LOW (ref 3.80–5.20)
RDW: 17.2 % — AB (ref 11.5–14.5)
WBC: 5 10*3/uL (ref 3.6–11.0)

## 2013-03-23 LAB — PHOSPHORUS: Phosphorus: 9.2 mg/dL — ABNORMAL HIGH (ref 2.5–4.9)

## 2013-03-24 LAB — PATHOLOGY REPORT

## 2013-03-25 LAB — HEMOGLOBIN: HGB: 9.8 g/dL — ABNORMAL LOW (ref 12.0–16.0)

## 2013-03-25 LAB — PHOSPHORUS: Phosphorus: 8.2 mg/dL — ABNORMAL HIGH (ref 2.5–4.9)

## 2013-03-25 LAB — POTASSIUM: POTASSIUM: 4 mmol/L (ref 3.5–5.1)

## 2013-03-25 LAB — PATHOLOGY REPORT

## 2013-03-26 LAB — PHENYTOIN LEVEL, TOTAL: Dilantin: 33.1 ug/mL — ABNORMAL HIGH (ref 10.0–20.0)

## 2013-03-27 LAB — HEMOGLOBIN: HGB: 9.4 g/dL — ABNORMAL LOW (ref 12.0–16.0)

## 2013-03-27 LAB — POTASSIUM: Potassium: 3.9 mmol/L (ref 3.5–5.1)

## 2013-03-27 LAB — ALBUMIN: Albumin: 3 g/dL — ABNORMAL LOW (ref 3.4–5.0)

## 2013-03-27 LAB — PHENYTOIN LEVEL, TOTAL: DILANTIN: 35 ug/mL — AB (ref 10.0–20.0)

## 2013-03-27 LAB — PHOSPHORUS: Phosphorus: 5.2 mg/dL — ABNORMAL HIGH (ref 2.5–4.9)

## 2013-03-28 LAB — BASIC METABOLIC PANEL
Anion Gap: 4 — ABNORMAL LOW (ref 7–16)
BUN: 19 mg/dL — AB (ref 7–18)
CREATININE: 6.85 mg/dL — AB (ref 0.60–1.30)
Calcium, Total: 9.1 mg/dL (ref 8.5–10.1)
Chloride: 91 mmol/L — ABNORMAL LOW (ref 98–107)
Co2: 35 mmol/L — ABNORMAL HIGH (ref 21–32)
EGFR (Non-African Amer.): 8 — ABNORMAL LOW
GFR CALC AF AMER: 9 — AB
GLUCOSE: 95 mg/dL (ref 65–99)
OSMOLALITY: 263 (ref 275–301)
POTASSIUM: 3.9 mmol/L (ref 3.5–5.1)
Sodium: 130 mmol/L — ABNORMAL LOW (ref 136–145)

## 2013-03-28 LAB — CBC WITH DIFFERENTIAL/PLATELET
BASOS PCT: 0.3 %
Basophil #: 0 10*3/uL (ref 0.0–0.1)
EOS ABS: 0.2 10*3/uL (ref 0.0–0.7)
Eosinophil %: 1.6 %
HCT: 28.1 % — ABNORMAL LOW (ref 35.0–47.0)
HGB: 9.4 g/dL — ABNORMAL LOW (ref 12.0–16.0)
LYMPHS PCT: 9.5 %
Lymphocyte #: 1 10*3/uL (ref 1.0–3.6)
MCH: 33 pg (ref 26.0–34.0)
MCHC: 33.5 g/dL (ref 32.0–36.0)
MCV: 98 fL (ref 80–100)
MONOS PCT: 10.1 %
Monocyte #: 1.1 x10 3/mm — ABNORMAL HIGH (ref 0.2–0.9)
NEUTROS ABS: 8.4 10*3/uL — AB (ref 1.4–6.5)
Neutrophil %: 78.5 %
PLATELETS: 257 10*3/uL (ref 150–440)
RBC: 2.86 10*6/uL — ABNORMAL LOW (ref 3.80–5.20)
RDW: 17.4 % — ABNORMAL HIGH (ref 11.5–14.5)
WBC: 10.7 10*3/uL (ref 3.6–11.0)

## 2013-03-28 LAB — PHENYTOIN LEVEL, TOTAL: DILANTIN: 22.4 ug/mL — AB (ref 10.0–20.0)

## 2013-03-29 LAB — PHENYTOIN LEVEL, TOTAL: Dilantin: 21.6 ug/mL — ABNORMAL HIGH (ref 10.0–20.0)

## 2013-03-30 LAB — PHOSPHORUS: PHOSPHORUS: 5.8 mg/dL — AB (ref 2.5–4.9)

## 2013-03-30 LAB — POTASSIUM: Potassium: 4.6 mmol/L (ref 3.5–5.1)

## 2013-03-30 LAB — PHENYTOIN LEVEL, TOTAL: Dilantin: 16.7 ug/mL (ref 10.0–20.0)

## 2013-03-30 LAB — HEMOGLOBIN: HGB: 9.1 g/dL — ABNORMAL LOW (ref 12.0–16.0)

## 2013-03-31 ENCOUNTER — Emergency Department: Payer: Self-pay | Admitting: Emergency Medicine

## 2013-03-31 LAB — COMPREHENSIVE METABOLIC PANEL
ALT: 15 U/L (ref 12–78)
AST: 28 U/L (ref 15–37)
Albumin: 2.5 g/dL — ABNORMAL LOW (ref 3.4–5.0)
Alkaline Phosphatase: 195 U/L — ABNORMAL HIGH
Anion Gap: 4 — ABNORMAL LOW (ref 7–16)
BUN: 26 mg/dL — ABNORMAL HIGH (ref 7–18)
Bilirubin,Total: 0.2 mg/dL (ref 0.2–1.0)
CHLORIDE: 95 mmol/L — AB (ref 98–107)
CREATININE: 7.72 mg/dL — AB (ref 0.60–1.30)
Calcium, Total: 8.7 mg/dL (ref 8.5–10.1)
Co2: 34 mmol/L — ABNORMAL HIGH (ref 21–32)
EGFR (Non-African Amer.): 7 — ABNORMAL LOW
GFR CALC AF AMER: 8 — AB
Glucose: 180 mg/dL — ABNORMAL HIGH (ref 65–99)
Osmolality: 276 (ref 275–301)
Potassium: 4.4 mmol/L (ref 3.5–5.1)
SODIUM: 133 mmol/L — AB (ref 136–145)
Total Protein: 8.6 g/dL — ABNORMAL HIGH (ref 6.4–8.2)

## 2013-03-31 LAB — CBC
HCT: 29.3 % — ABNORMAL LOW (ref 35.0–47.0)
HGB: 9.5 g/dL — AB (ref 12.0–16.0)
MCH: 32.1 pg (ref 26.0–34.0)
MCHC: 32.6 g/dL (ref 32.0–36.0)
MCV: 99 fL (ref 80–100)
PLATELETS: 393 10*3/uL (ref 150–440)
RBC: 2.97 10*6/uL — ABNORMAL LOW (ref 3.80–5.20)
RDW: 18.3 % — ABNORMAL HIGH (ref 11.5–14.5)
WBC: 5.8 10*3/uL (ref 3.6–11.0)

## 2013-03-31 LAB — PHENYTOIN LEVEL, TOTAL: Dilantin: 12.2 ug/mL (ref 10.0–20.0)

## 2013-03-31 LAB — LIPASE, BLOOD: Lipase: 98 U/L (ref 73–393)

## 2013-03-31 LAB — HCG, QUANTITATIVE, PREGNANCY: BETA HCG, QUANT.: 3 m[IU]/mL

## 2013-04-01 LAB — CULTURE, BLOOD (SINGLE)

## 2013-04-02 LAB — CULTURE, BLOOD (SINGLE)

## 2013-04-07 ENCOUNTER — Emergency Department: Payer: Self-pay | Admitting: Emergency Medicine

## 2013-04-08 LAB — COMPREHENSIVE METABOLIC PANEL
ANION GAP: 10 (ref 7–16)
Albumin: 2.9 g/dL — ABNORMAL LOW (ref 3.4–5.0)
Alkaline Phosphatase: 240 U/L — ABNORMAL HIGH
BILIRUBIN TOTAL: 0.3 mg/dL (ref 0.2–1.0)
BUN: 41 mg/dL — ABNORMAL HIGH (ref 7–18)
CHLORIDE: 96 mmol/L — AB (ref 98–107)
CO2: 29 mmol/L (ref 21–32)
Calcium, Total: 9 mg/dL (ref 8.5–10.1)
Creatinine: 7.86 mg/dL — ABNORMAL HIGH (ref 0.60–1.30)
EGFR (African American): 7 — ABNORMAL LOW
EGFR (Non-African Amer.): 6 — ABNORMAL LOW
Glucose: 93 mg/dL (ref 65–99)
Osmolality: 280 (ref 275–301)
Potassium: 4.1 mmol/L (ref 3.5–5.1)
SGOT(AST): 22 U/L (ref 15–37)
SGPT (ALT): 11 U/L — ABNORMAL LOW (ref 12–78)
Sodium: 135 mmol/L — ABNORMAL LOW (ref 136–145)
Total Protein: 9.5 g/dL — ABNORMAL HIGH (ref 6.4–8.2)

## 2013-04-08 LAB — PHENYTOIN LEVEL, TOTAL: Dilantin: 9.3 ug/mL — ABNORMAL LOW (ref 10.0–20.0)

## 2013-04-08 LAB — HCG, QUANTITATIVE, PREGNANCY: Beta Hcg, Quant.: 3 m[IU]/mL

## 2013-04-08 LAB — CBC
HCT: 30.6 % — AB (ref 35.0–47.0)
HGB: 10.2 g/dL — ABNORMAL LOW (ref 12.0–16.0)
MCH: 32 pg (ref 26.0–34.0)
MCHC: 33.3 g/dL (ref 32.0–36.0)
MCV: 96 fL (ref 80–100)
Platelet: 574 10*3/uL — ABNORMAL HIGH (ref 150–440)
RBC: 3.18 10*6/uL — ABNORMAL LOW (ref 3.80–5.20)
RDW: 18.2 % — ABNORMAL HIGH (ref 11.5–14.5)
WBC: 7.6 10*3/uL (ref 3.6–11.0)

## 2013-04-08 LAB — LIPASE, BLOOD: LIPASE: 120 U/L (ref 73–393)

## 2013-04-15 ENCOUNTER — Emergency Department: Payer: Self-pay | Admitting: Emergency Medicine

## 2013-04-15 LAB — COMPREHENSIVE METABOLIC PANEL
ANION GAP: 6 — AB (ref 7–16)
AST: 22 U/L (ref 15–37)
Albumin: 2.9 g/dL — ABNORMAL LOW (ref 3.4–5.0)
Alkaline Phosphatase: 208 U/L — ABNORMAL HIGH
BUN: 41 mg/dL — AB (ref 7–18)
Bilirubin,Total: 0.2 mg/dL (ref 0.2–1.0)
CALCIUM: 7.5 mg/dL — AB (ref 8.5–10.1)
CHLORIDE: 99 mmol/L (ref 98–107)
CO2: 29 mmol/L (ref 21–32)
CREATININE: 7.13 mg/dL — AB (ref 0.60–1.30)
EGFR (African American): 8 — ABNORMAL LOW
EGFR (Non-African Amer.): 7 — ABNORMAL LOW
GLUCOSE: 111 mg/dL — AB (ref 65–99)
Osmolality: 279 (ref 275–301)
POTASSIUM: 4.1 mmol/L (ref 3.5–5.1)
SGPT (ALT): 14 U/L (ref 12–78)
SODIUM: 134 mmol/L — AB (ref 136–145)
TOTAL PROTEIN: 8.8 g/dL — AB (ref 6.4–8.2)

## 2013-04-15 LAB — CBC WITH DIFFERENTIAL/PLATELET
BASOS ABS: 0.1 10*3/uL (ref 0.0–0.1)
BASOS PCT: 1.3 %
Eosinophil #: 0.3 10*3/uL (ref 0.0–0.7)
Eosinophil %: 5.4 %
HCT: 26.1 % — ABNORMAL LOW (ref 35.0–47.0)
HGB: 8.7 g/dL — ABNORMAL LOW (ref 12.0–16.0)
Lymphocyte #: 1.3 10*3/uL (ref 1.0–3.6)
Lymphocyte %: 21.6 %
MCH: 32.4 pg (ref 26.0–34.0)
MCHC: 33.4 g/dL (ref 32.0–36.0)
MCV: 97 fL (ref 80–100)
Monocyte #: 0.4 x10 3/mm (ref 0.2–0.9)
Monocyte %: 6 %
Neutrophil #: 3.8 10*3/uL (ref 1.4–6.5)
Neutrophil %: 65.7 %
Platelet: 331 10*3/uL (ref 150–440)
RBC: 2.69 10*6/uL — AB (ref 3.80–5.20)
RDW: 19.5 % — AB (ref 11.5–14.5)
WBC: 5.8 10*3/uL (ref 3.6–11.0)

## 2013-04-15 LAB — PHENYTOIN LEVEL, TOTAL: DILANTIN: 8.8 ug/mL — AB (ref 10.0–20.0)

## 2013-04-15 LAB — HCG, QUANTITATIVE, PREGNANCY: Beta Hcg, Quant.: 3 m[IU]/mL

## 2013-04-20 ENCOUNTER — Inpatient Hospital Stay: Payer: Self-pay | Admitting: Internal Medicine

## 2013-04-20 LAB — COMPREHENSIVE METABOLIC PANEL
ALT: 51 U/L (ref 12–78)
AST: 81 U/L — AB (ref 15–37)
Albumin: 3 g/dL — ABNORMAL LOW (ref 3.4–5.0)
Alkaline Phosphatase: 233 U/L — ABNORMAL HIGH
Anion Gap: 9 (ref 7–16)
BILIRUBIN TOTAL: 0.4 mg/dL (ref 0.2–1.0)
BUN: 55 mg/dL — ABNORMAL HIGH (ref 7–18)
CALCIUM: 8 mg/dL — AB (ref 8.5–10.1)
Chloride: 99 mmol/L (ref 98–107)
Co2: 25 mmol/L (ref 21–32)
Creatinine: 7.82 mg/dL — ABNORMAL HIGH (ref 0.60–1.30)
EGFR (Non-African Amer.): 6 — ABNORMAL LOW
GFR CALC AF AMER: 7 — AB
Glucose: 191 mg/dL — ABNORMAL HIGH (ref 65–99)
Osmolality: 287 (ref 275–301)
Potassium: 4.6 mmol/L (ref 3.5–5.1)
Sodium: 133 mmol/L — ABNORMAL LOW (ref 136–145)
TOTAL PROTEIN: 8.9 g/dL — AB (ref 6.4–8.2)

## 2013-04-20 LAB — CK TOTAL AND CKMB (NOT AT ARMC)
CK, Total: 263 U/L — ABNORMAL HIGH
CK, Total: 148 U/L
CK, Total: 143 U/L
CK-MB: 3.2 ng/mL (ref 0.5–3.6)
CK-MB: 2.5 ng/mL (ref 0.5–3.6)
CK-MB: 2.4 ng/mL (ref 0.5–3.6)

## 2013-04-20 LAB — CBC WITH DIFFERENTIAL/PLATELET
Basophil #: 0.1 10*3/uL (ref 0.0–0.1)
Basophil %: 0.9 %
EOS ABS: 0.3 10*3/uL (ref 0.0–0.7)
Eosinophil %: 5.2 %
HCT: 26 % — ABNORMAL LOW (ref 35.0–47.0)
HGB: 8.7 g/dL — AB (ref 12.0–16.0)
LYMPHS ABS: 1.3 10*3/uL (ref 1.0–3.6)
Lymphocyte %: 21.7 %
MCH: 32.1 pg (ref 26.0–34.0)
MCHC: 33.6 g/dL (ref 32.0–36.0)
MCV: 96 fL (ref 80–100)
Monocyte #: 0.2 x10 3/mm (ref 0.2–0.9)
Monocyte %: 3.6 %
NEUTROS PCT: 68.6 %
Neutrophil #: 4.1 10*3/uL (ref 1.4–6.5)
Platelet: 325 10*3/uL (ref 150–440)
RBC: 2.72 10*6/uL — ABNORMAL LOW (ref 3.80–5.20)
RDW: 18.8 % — AB (ref 11.5–14.5)
WBC: 6 10*3/uL (ref 3.6–11.0)

## 2013-04-20 LAB — TROPONIN I
TROPONIN-I: 0.04 ng/mL
Troponin-I: 0.02 ng/mL
Troponin-I: 0.03 ng/mL

## 2013-04-20 LAB — PHOSPHORUS: Phosphorus: 7.4 mg/dL — ABNORMAL HIGH (ref 2.5–4.9)

## 2013-04-20 LAB — PHENYTOIN LEVEL, TOTAL: Dilantin: 4.9 ug/mL — ABNORMAL LOW (ref 10.0–20.0)

## 2013-04-22 LAB — PHOSPHORUS: PHOSPHORUS: 6 mg/dL — AB (ref 2.5–4.9)

## 2013-04-28 ENCOUNTER — Inpatient Hospital Stay: Payer: Self-pay | Admitting: Specialist

## 2013-04-28 LAB — BASIC METABOLIC PANEL
ANION GAP: 7 (ref 7–16)
BUN: 52 mg/dL — ABNORMAL HIGH (ref 7–18)
CREATININE: 9.62 mg/dL — AB (ref 0.60–1.30)
Calcium, Total: 7.4 mg/dL — ABNORMAL LOW (ref 8.5–10.1)
Chloride: 99 mmol/L (ref 98–107)
Co2: 28 mmol/L (ref 21–32)
EGFR (African American): 6 — ABNORMAL LOW
GFR CALC NON AF AMER: 5 — AB
Glucose: 131 mg/dL — ABNORMAL HIGH (ref 65–99)
OSMOLALITY: 284 (ref 275–301)
Potassium: 4.5 mmol/L (ref 3.5–5.1)
SODIUM: 134 mmol/L — AB (ref 136–145)

## 2013-04-28 LAB — CBC
HCT: 23.7 % — AB (ref 35.0–47.0)
HGB: 7.7 g/dL — ABNORMAL LOW (ref 12.0–16.0)
MCH: 31.9 pg (ref 26.0–34.0)
MCHC: 32.7 g/dL (ref 32.0–36.0)
MCV: 98 fL (ref 80–100)
Platelet: 271 10*3/uL (ref 150–440)
RBC: 2.42 10*6/uL — AB (ref 3.80–5.20)
RDW: 19.2 % — ABNORMAL HIGH (ref 11.5–14.5)
WBC: 5.4 10*3/uL (ref 3.6–11.0)

## 2013-04-28 LAB — PHENYTOIN LEVEL, TOTAL: Dilantin: 7.7 ug/mL — ABNORMAL LOW (ref 10.0–20.0)

## 2013-04-28 LAB — TROPONIN I: Troponin-I: <0.02 ng/mL

## 2013-04-29 LAB — BASIC METABOLIC PANEL
ANION GAP: 2 — AB (ref 7–16)
BUN: 25 mg/dL — AB (ref 7–18)
CALCIUM: 8.1 mg/dL — AB (ref 8.5–10.1)
CO2: 33 mmol/L — AB (ref 21–32)
Chloride: 98 mmol/L (ref 98–107)
Creatinine: 5.85 mg/dL — ABNORMAL HIGH (ref 0.60–1.30)
EGFR (African American): 11 — ABNORMAL LOW
GFR CALC NON AF AMER: 9 — AB
GLUCOSE: 78 mg/dL (ref 65–99)
Osmolality: 270 (ref 275–301)
POTASSIUM: 4.2 mmol/L (ref 3.5–5.1)
Sodium: 133 mmol/L — ABNORMAL LOW (ref 136–145)

## 2013-04-29 LAB — CBC WITH DIFFERENTIAL/PLATELET
BASOS ABS: 0.1 10*3/uL (ref 0.0–0.1)
Basophil %: 1.3 %
EOS ABS: 0.2 10*3/uL (ref 0.0–0.7)
EOS PCT: 4.1 %
HCT: 22 % — AB (ref 35.0–47.0)
HGB: 7.4 g/dL — AB (ref 12.0–16.0)
Lymphocyte #: 1.3 10*3/uL (ref 1.0–3.6)
Lymphocyte %: 30 %
MCH: 32.7 pg (ref 26.0–34.0)
MCHC: 33.6 g/dL (ref 32.0–36.0)
MCV: 97 fL (ref 80–100)
Monocyte #: 0.2 x10 3/mm (ref 0.2–0.9)
Monocyte %: 5.5 %
NEUTROS ABS: 2.5 10*3/uL (ref 1.4–6.5)
Neutrophil %: 59.1 %
Platelet: 235 10*3/uL (ref 150–440)
RBC: 2.26 10*6/uL — AB (ref 3.80–5.20)
RDW: 19.5 % — ABNORMAL HIGH (ref 11.5–14.5)
WBC: 4.2 10*3/uL (ref 3.6–11.0)

## 2013-05-05 ENCOUNTER — Ambulatory Visit: Payer: Self-pay | Admitting: Internal Medicine

## 2013-05-05 ENCOUNTER — Emergency Department: Payer: Self-pay | Admitting: Emergency Medicine

## 2013-05-05 LAB — BASIC METABOLIC PANEL
Anion Gap: 6 — ABNORMAL LOW (ref 7–16)
BUN: 16 mg/dL (ref 7–18)
CREATININE: 4.91 mg/dL — AB (ref 0.60–1.30)
Calcium, Total: 8.1 mg/dL — ABNORMAL LOW (ref 8.5–10.1)
Chloride: 98 mmol/L (ref 98–107)
Co2: 31 mmol/L (ref 21–32)
GFR CALC AF AMER: 13 — AB
GFR CALC NON AF AMER: 11 — AB
Glucose: 77 mg/dL (ref 65–99)
OSMOLALITY: 270 (ref 275–301)
POTASSIUM: 3.6 mmol/L (ref 3.5–5.1)
Sodium: 135 mmol/L — ABNORMAL LOW (ref 136–145)

## 2013-05-05 LAB — CBC
HCT: 24.9 % — ABNORMAL LOW (ref 35.0–47.0)
HGB: 8 g/dL — ABNORMAL LOW (ref 12.0–16.0)
MCH: 31.5 pg (ref 26.0–34.0)
MCHC: 32.2 g/dL (ref 32.0–36.0)
MCV: 98 fL (ref 80–100)
Platelet: 279 10*3/uL (ref 150–440)
RBC: 2.54 10*6/uL — AB (ref 3.80–5.20)
RDW: 19.4 % — AB (ref 11.5–14.5)
WBC: 4 10*3/uL (ref 3.6–11.0)

## 2013-05-05 LAB — SEDIMENTATION RATE: Erythrocyte Sed Rate: 91 mm/hr — ABNORMAL HIGH (ref 0–20)

## 2013-05-12 ENCOUNTER — Emergency Department: Payer: Self-pay | Admitting: Emergency Medicine

## 2013-05-13 LAB — CBC WITH DIFFERENTIAL/PLATELET
BASOS PCT: 1.2 %
Basophil #: 0 10*3/uL (ref 0.0–0.1)
Eosinophil #: 0.2 10*3/uL (ref 0.0–0.7)
Eosinophil %: 5.1 %
HCT: 28 % — AB (ref 35.0–47.0)
HGB: 9.4 g/dL — ABNORMAL LOW (ref 12.0–16.0)
LYMPHS ABS: 1 10*3/uL (ref 1.0–3.6)
Lymphocyte %: 26.2 %
MCH: 33 pg (ref 26.0–34.0)
MCHC: 33.6 g/dL (ref 32.0–36.0)
MCV: 98 fL (ref 80–100)
Monocyte #: 0.4 x10 3/mm (ref 0.2–0.9)
Monocyte %: 11.3 %
Neutrophil #: 2.2 10*3/uL (ref 1.4–6.5)
Neutrophil %: 56.2 %
Platelet: 324 10*3/uL (ref 150–440)
RBC: 2.85 10*6/uL — ABNORMAL LOW (ref 3.80–5.20)
RDW: 19.5 % — ABNORMAL HIGH (ref 11.5–14.5)
WBC: 3.9 10*3/uL (ref 3.6–11.0)

## 2013-05-13 LAB — COMPREHENSIVE METABOLIC PANEL
ANION GAP: 4 — AB (ref 7–16)
AST: 17 U/L (ref 15–37)
Albumin: 3.3 g/dL — ABNORMAL LOW (ref 3.4–5.0)
Alkaline Phosphatase: 294 U/L — ABNORMAL HIGH
BILIRUBIN TOTAL: 0.2 mg/dL (ref 0.2–1.0)
BUN: 19 mg/dL — AB (ref 7–18)
Calcium, Total: 7.8 mg/dL — ABNORMAL LOW (ref 8.5–10.1)
Chloride: 97 mmol/L — ABNORMAL LOW (ref 98–107)
Co2: 34 mmol/L — ABNORMAL HIGH (ref 21–32)
Creatinine: 5.77 mg/dL — ABNORMAL HIGH (ref 0.60–1.30)
EGFR (African American): 11 — ABNORMAL LOW
EGFR (Non-African Amer.): 9 — ABNORMAL LOW
GLUCOSE: 98 mg/dL (ref 65–99)
Osmolality: 272 (ref 275–301)
POTASSIUM: 3.6 mmol/L (ref 3.5–5.1)
SGPT (ALT): 19 U/L (ref 12–78)
Sodium: 135 mmol/L — ABNORMAL LOW (ref 136–145)
TOTAL PROTEIN: 8.9 g/dL — AB (ref 6.4–8.2)

## 2013-05-13 LAB — TROPONIN I

## 2013-05-13 LAB — LIPASE, BLOOD: LIPASE: 120 U/L (ref 73–393)

## 2013-05-18 ENCOUNTER — Inpatient Hospital Stay: Payer: Self-pay | Admitting: Student

## 2013-05-18 LAB — CK-MB
CK-MB: 2.3 ng/mL (ref 0.5–3.6)
CK-MB: 2.3 ng/mL (ref 0.5–3.6)

## 2013-05-18 LAB — COMPREHENSIVE METABOLIC PANEL
ALBUMIN: 3.2 g/dL — AB (ref 3.4–5.0)
AST: 29 U/L (ref 15–37)
Alkaline Phosphatase: 284 U/L — ABNORMAL HIGH
Anion Gap: 8 (ref 7–16)
BILIRUBIN TOTAL: 0.4 mg/dL (ref 0.2–1.0)
BUN: 40 mg/dL — ABNORMAL HIGH (ref 7–18)
CALCIUM: 7.8 mg/dL — AB (ref 8.5–10.1)
CREATININE: 7.89 mg/dL — AB (ref 0.60–1.30)
Chloride: 103 mmol/L (ref 98–107)
Co2: 25 mmol/L (ref 21–32)
EGFR (Non-African Amer.): 6 — ABNORMAL LOW
GFR CALC AF AMER: 7 — AB
Glucose: 82 mg/dL (ref 65–99)
Osmolality: 281 (ref 275–301)
Potassium: 4.4 mmol/L (ref 3.5–5.1)
SGPT (ALT): 21 U/L (ref 12–78)
SODIUM: 136 mmol/L (ref 136–145)
Total Protein: 8.5 g/dL — ABNORMAL HIGH (ref 6.4–8.2)

## 2013-05-18 LAB — CBC
HCT: 26.7 % — ABNORMAL LOW (ref 35.0–47.0)
HGB: 8.4 g/dL — AB (ref 12.0–16.0)
MCH: 31.3 pg (ref 26.0–34.0)
MCHC: 31.5 g/dL — ABNORMAL LOW (ref 32.0–36.0)
MCV: 99 fL (ref 80–100)
Platelet: 327 10*3/uL (ref 150–440)
RBC: 2.69 10*6/uL — AB (ref 3.80–5.20)
RDW: 18.9 % — ABNORMAL HIGH (ref 11.5–14.5)
WBC: 5 10*3/uL (ref 3.6–11.0)

## 2013-05-18 LAB — PHOSPHORUS: PHOSPHORUS: 6 mg/dL — AB (ref 2.5–4.9)

## 2013-05-18 LAB — TROPONIN I: Troponin-I: <0.02 ng/mL

## 2013-05-18 LAB — D-DIMER(ARMC): D-Dimer: 1711 ng/ml

## 2013-05-18 LAB — PHENYTOIN LEVEL, TOTAL: Dilantin: 4.3 ug/mL — ABNORMAL LOW (ref 10.0–20.0)

## 2013-05-19 LAB — BASIC METABOLIC PANEL
Anion Gap: 5 — ABNORMAL LOW (ref 7–16)
BUN: 19 mg/dL — AB (ref 7–18)
CALCIUM: 8.8 mg/dL (ref 8.5–10.1)
CHLORIDE: 101 mmol/L (ref 98–107)
CREATININE: 4.66 mg/dL — AB (ref 0.60–1.30)
Co2: 32 mmol/L (ref 21–32)
EGFR (African American): 14 — ABNORMAL LOW
GFR CALC NON AF AMER: 12 — AB
Glucose: 87 mg/dL (ref 65–99)
Osmolality: 277 (ref 275–301)
Potassium: 4.3 mmol/L (ref 3.5–5.1)
Sodium: 138 mmol/L (ref 136–145)

## 2013-05-19 LAB — CBC WITH DIFFERENTIAL/PLATELET
Basophil #: 0.1 10*3/uL (ref 0.0–0.1)
Basophil %: 3 %
Eosinophil #: 0.2 10*3/uL (ref 0.0–0.7)
Eosinophil %: 4.2 %
HCT: 27.6 % — ABNORMAL LOW (ref 35.0–47.0)
HGB: 9.1 g/dL — ABNORMAL LOW (ref 12.0–16.0)
LYMPHS ABS: 1.2 10*3/uL (ref 1.0–3.6)
LYMPHS PCT: 30 %
MCH: 32.9 pg (ref 26.0–34.0)
MCHC: 33.1 g/dL (ref 32.0–36.0)
MCV: 99 fL (ref 80–100)
MONO ABS: 0.2 x10 3/mm (ref 0.2–0.9)
Monocyte %: 5.4 %
Neutrophil #: 2.2 10*3/uL (ref 1.4–6.5)
Neutrophil %: 57.4 %
Platelet: 320 10*3/uL (ref 150–440)
RBC: 2.78 10*6/uL — ABNORMAL LOW (ref 3.80–5.20)
RDW: 18.9 % — ABNORMAL HIGH (ref 11.5–14.5)
WBC: 3.9 10*3/uL (ref 3.6–11.0)

## 2013-05-19 LAB — MAGNESIUM: Magnesium: 2.2 mg/dL

## 2013-05-19 LAB — PHOSPHORUS: Phosphorus: 3.9 mg/dL (ref 2.5–4.9)

## 2013-05-19 LAB — TSH: THYROID STIMULATING HORM: 4.88 u[IU]/mL — AB

## 2013-05-20 LAB — PHOSPHORUS: Phosphorus: 3.4 mg/dL (ref 2.5–4.9)

## 2013-05-26 ENCOUNTER — Emergency Department: Payer: Self-pay | Admitting: Emergency Medicine

## 2013-05-26 LAB — URINALYSIS, COMPLETE
Bilirubin,UR: NEGATIVE
Hyaline Cast: 1
KETONE: NEGATIVE
NITRITE: NEGATIVE
PH: 8 (ref 4.5–8.0)
Protein: >=500
RBC,UR: 7 /HPF (ref 0–5)
Specific Gravity: 1.013 (ref 1.003–1.030)
Squamous Epithelial: 30

## 2013-05-27 ENCOUNTER — Emergency Department: Payer: Self-pay | Admitting: Emergency Medicine

## 2013-05-28 LAB — URINE CULTURE

## 2013-05-31 IMAGING — CR DG CHEST 2V
2 series · 2 of 2 positions shown · non-contrast
Comparison: 06/04/2010

CLINICAL DATA: Diabetes, hypertension, DVT.  End-stage renal
disease.

CHEST - 2 VIEW

[view not recorded (1 of 2)]
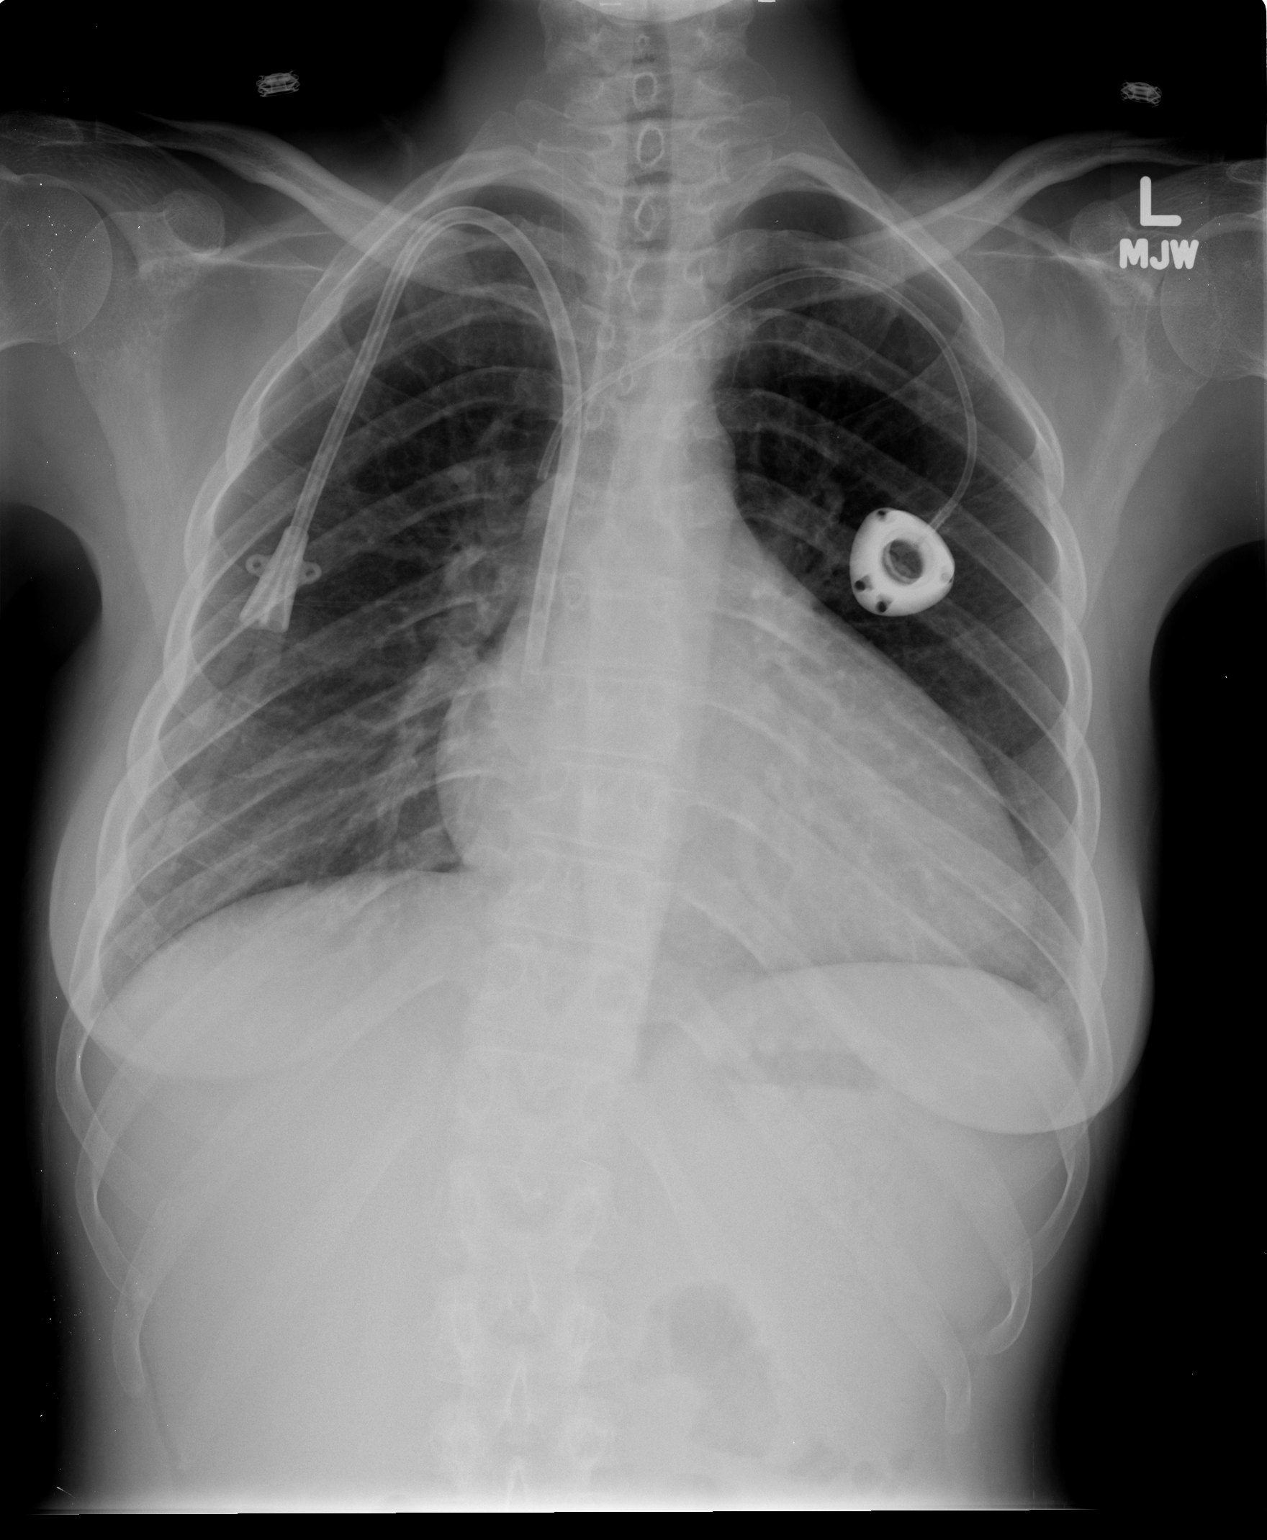

[view not recorded (2 of 2)]
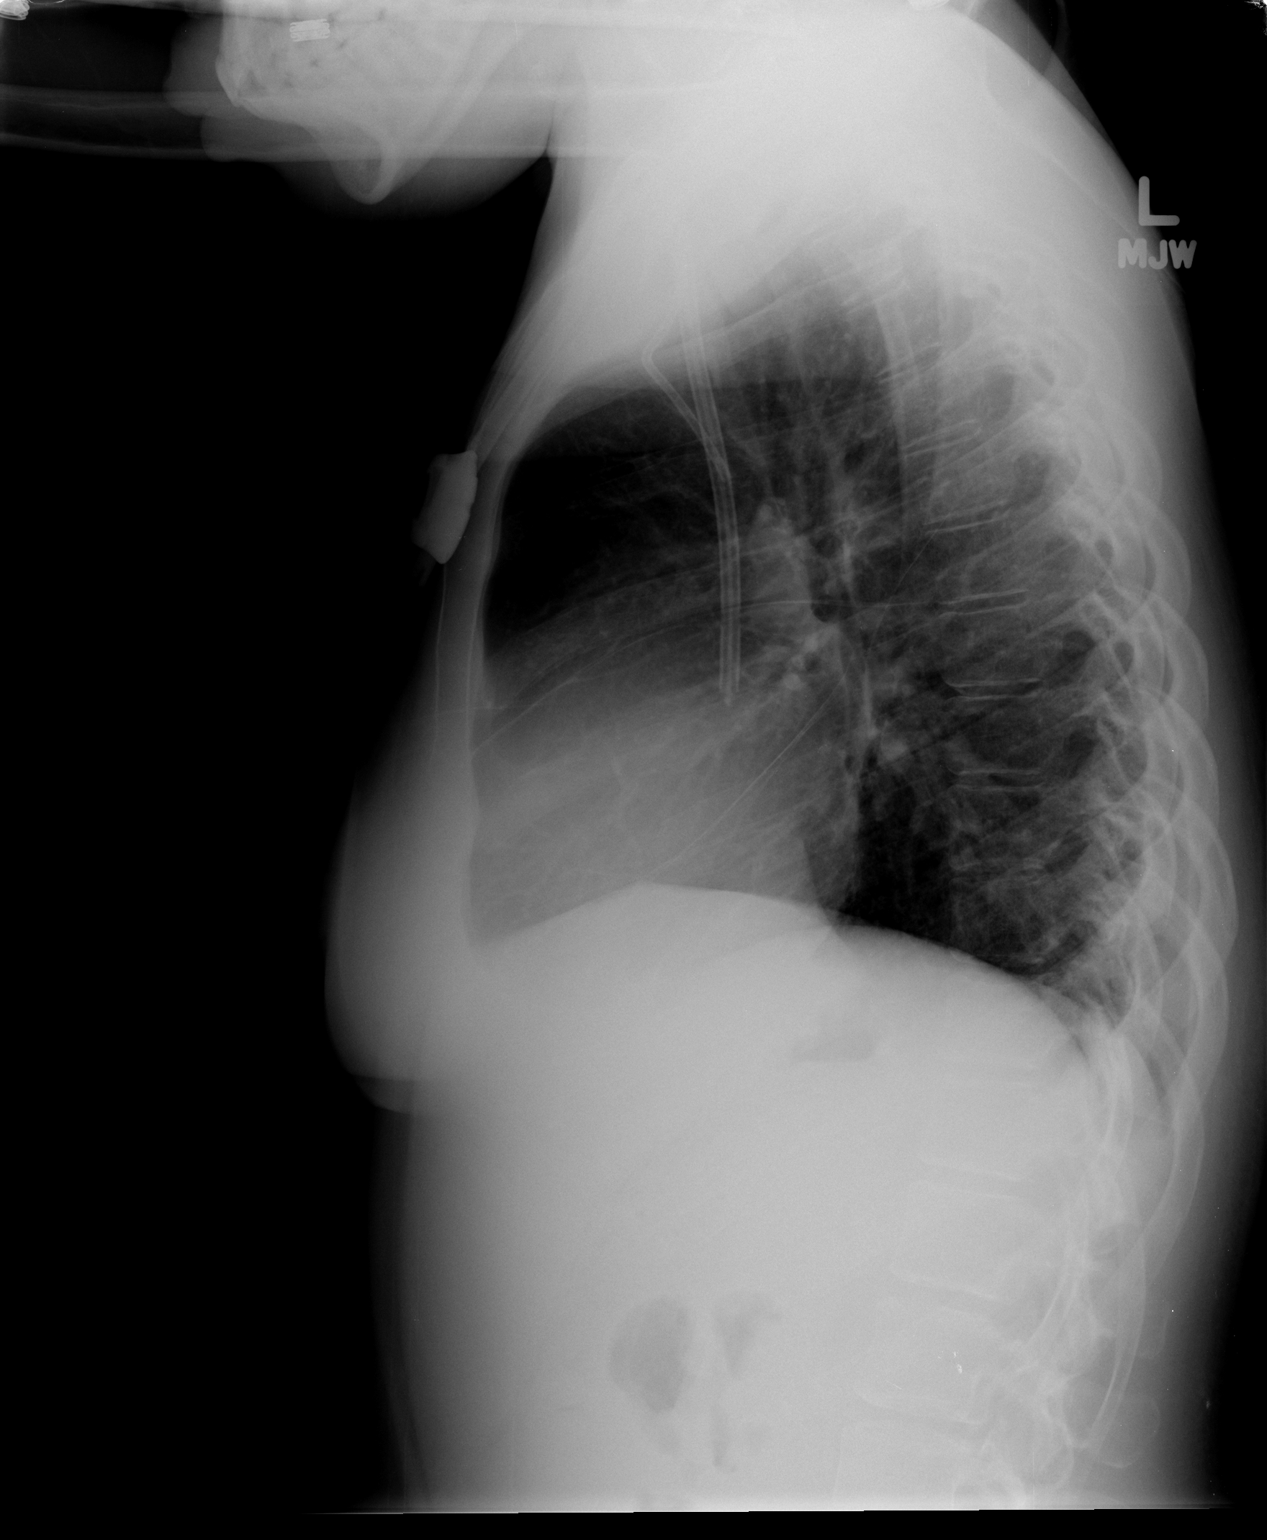

[2 of 2 positions shown; findings below may reference images not displayed]

FINDINGS: Right dialysis catheter is in place with the tip at the
cavoatrial junction.  Left Port-A-Cath has been placed with the tip
in the SVC.  No pneumothorax.  Cardiomegaly.  Lungs are clear.  No
effusions or edema.
IMPRESSION: Bilateral lines as above.  No pneumothorax.  Cardiomegaly.

## 2013-06-02 ENCOUNTER — Emergency Department: Payer: Self-pay | Admitting: Emergency Medicine

## 2013-06-02 LAB — URINALYSIS, COMPLETE
BILIRUBIN, UR: NEGATIVE
Glucose,UR: 150 mg/dL (ref 0–75)
KETONE: NEGATIVE
NITRITE: NEGATIVE
PH: 8 (ref 4.5–8.0)
Protein: >=500
SPECIFIC GRAVITY: 1.011 (ref 1.003–1.030)
Squamous Epithelial: 27
WBC UR: 8 /HPF (ref 0–5)

## 2013-06-04 ENCOUNTER — Ambulatory Visit: Payer: Self-pay | Admitting: Internal Medicine

## 2013-06-15 ENCOUNTER — Ambulatory Visit: Payer: Self-pay | Admitting: Internal Medicine

## 2013-06-16 ENCOUNTER — Emergency Department: Payer: Self-pay | Admitting: Emergency Medicine

## 2013-06-16 LAB — CBC
HCT: 31.3 % — ABNORMAL LOW (ref 35.0–47.0)
HGB: 10 g/dL — AB (ref 12.0–16.0)
MCH: 32.6 pg (ref 26.0–34.0)
MCHC: 32.1 g/dL (ref 32.0–36.0)
MCV: 102 fL — AB (ref 80–100)
PLATELETS: 122 10*3/uL — AB (ref 150–440)
RBC: 3.08 10*6/uL — AB (ref 3.80–5.20)
RDW: 17.8 % — AB (ref 11.5–14.5)
WBC: 3.2 10*3/uL — ABNORMAL LOW (ref 3.6–11.0)

## 2013-06-16 LAB — BASIC METABOLIC PANEL
Anion Gap: 6 — ABNORMAL LOW (ref 7–16)
BUN: 27 mg/dL — AB (ref 7–18)
CO2: 28 mmol/L (ref 21–32)
Calcium, Total: 7.8 mg/dL — ABNORMAL LOW (ref 8.5–10.1)
Chloride: 102 mmol/L (ref 98–107)
Creatinine: 4.26 mg/dL — ABNORMAL HIGH (ref 0.60–1.30)
GFR CALC AF AMER: 16 — AB
GFR CALC NON AF AMER: 13 — AB
Glucose: 93 mg/dL (ref 65–99)
OSMOLALITY: 277 (ref 275–301)
Potassium: 3.8 mmol/L (ref 3.5–5.1)
SODIUM: 136 mmol/L (ref 136–145)

## 2013-06-16 LAB — TROPONIN I: Troponin-I: <0.02 ng/mL

## 2013-07-05 ENCOUNTER — Emergency Department: Payer: Self-pay | Admitting: Emergency Medicine

## 2013-07-09 ENCOUNTER — Emergency Department: Payer: Self-pay | Admitting: Emergency Medicine

## 2013-07-09 LAB — CBC WITH DIFFERENTIAL/PLATELET
Basophil #: 0.1 10*3/uL (ref 0.0–0.1)
Basophil %: 1.1 %
Eosinophil #: 0.2 10*3/uL (ref 0.0–0.7)
Eosinophil %: 3.2 %
HCT: 32 % — AB (ref 35.0–47.0)
HGB: 10.3 g/dL — ABNORMAL LOW (ref 12.0–16.0)
Lymphocyte #: 1.4 10*3/uL (ref 1.0–3.6)
Lymphocyte %: 27.2 %
MCH: 32.7 pg (ref 26.0–34.0)
MCHC: 32.2 g/dL (ref 32.0–36.0)
MCV: 102 fL — ABNORMAL HIGH (ref 80–100)
MONO ABS: 0.6 x10 3/mm (ref 0.2–0.9)
Monocyte %: 12 %
NEUTROS PCT: 56.5 %
Neutrophil #: 3 10*3/uL (ref 1.4–6.5)
Platelet: 287 10*3/uL (ref 150–440)
RBC: 3.15 10*6/uL — ABNORMAL LOW (ref 3.80–5.20)
RDW: 17.4 % — AB (ref 11.5–14.5)
WBC: 5.3 10*3/uL (ref 3.6–11.0)

## 2013-07-09 LAB — COMPREHENSIVE METABOLIC PANEL
ALBUMIN: 3.2 g/dL — AB (ref 3.4–5.0)
Alkaline Phosphatase: 244 U/L — ABNORMAL HIGH
Anion Gap: 10 (ref 7–16)
BILIRUBIN TOTAL: 0.3 mg/dL (ref 0.2–1.0)
BUN: 67 mg/dL — ABNORMAL HIGH (ref 7–18)
CREATININE: 11.52 mg/dL — AB (ref 0.60–1.30)
Calcium, Total: 8.5 mg/dL (ref 8.5–10.1)
Chloride: 99 mmol/L (ref 98–107)
Co2: 28 mmol/L (ref 21–32)
EGFR (African American): 5 — ABNORMAL LOW
GFR CALC NON AF AMER: 4 — AB
Glucose: 85 mg/dL (ref 65–99)
Osmolality: 292 (ref 275–301)
Potassium: 5.4 mmol/L — ABNORMAL HIGH (ref 3.5–5.1)
SGOT(AST): 24 U/L (ref 15–37)
SGPT (ALT): 18 U/L (ref 12–78)
Sodium: 137 mmol/L (ref 136–145)
Total Protein: 8.8 g/dL — ABNORMAL HIGH (ref 6.4–8.2)

## 2013-07-09 LAB — PROTIME-INR
INR: 1.1
PROTHROMBIN TIME: 14.4 s (ref 11.5–14.7)

## 2013-07-15 IMAGING — CR DG CHEST 1V
1 series · 1 of 1 positions shown · non-contrast
Comparison: 05/14/2011

CLINICAL DATA: Fever, abdominal pain, and vomiting.  Dialysis
patient.

CHEST - 1 VIEW

[view not recorded]
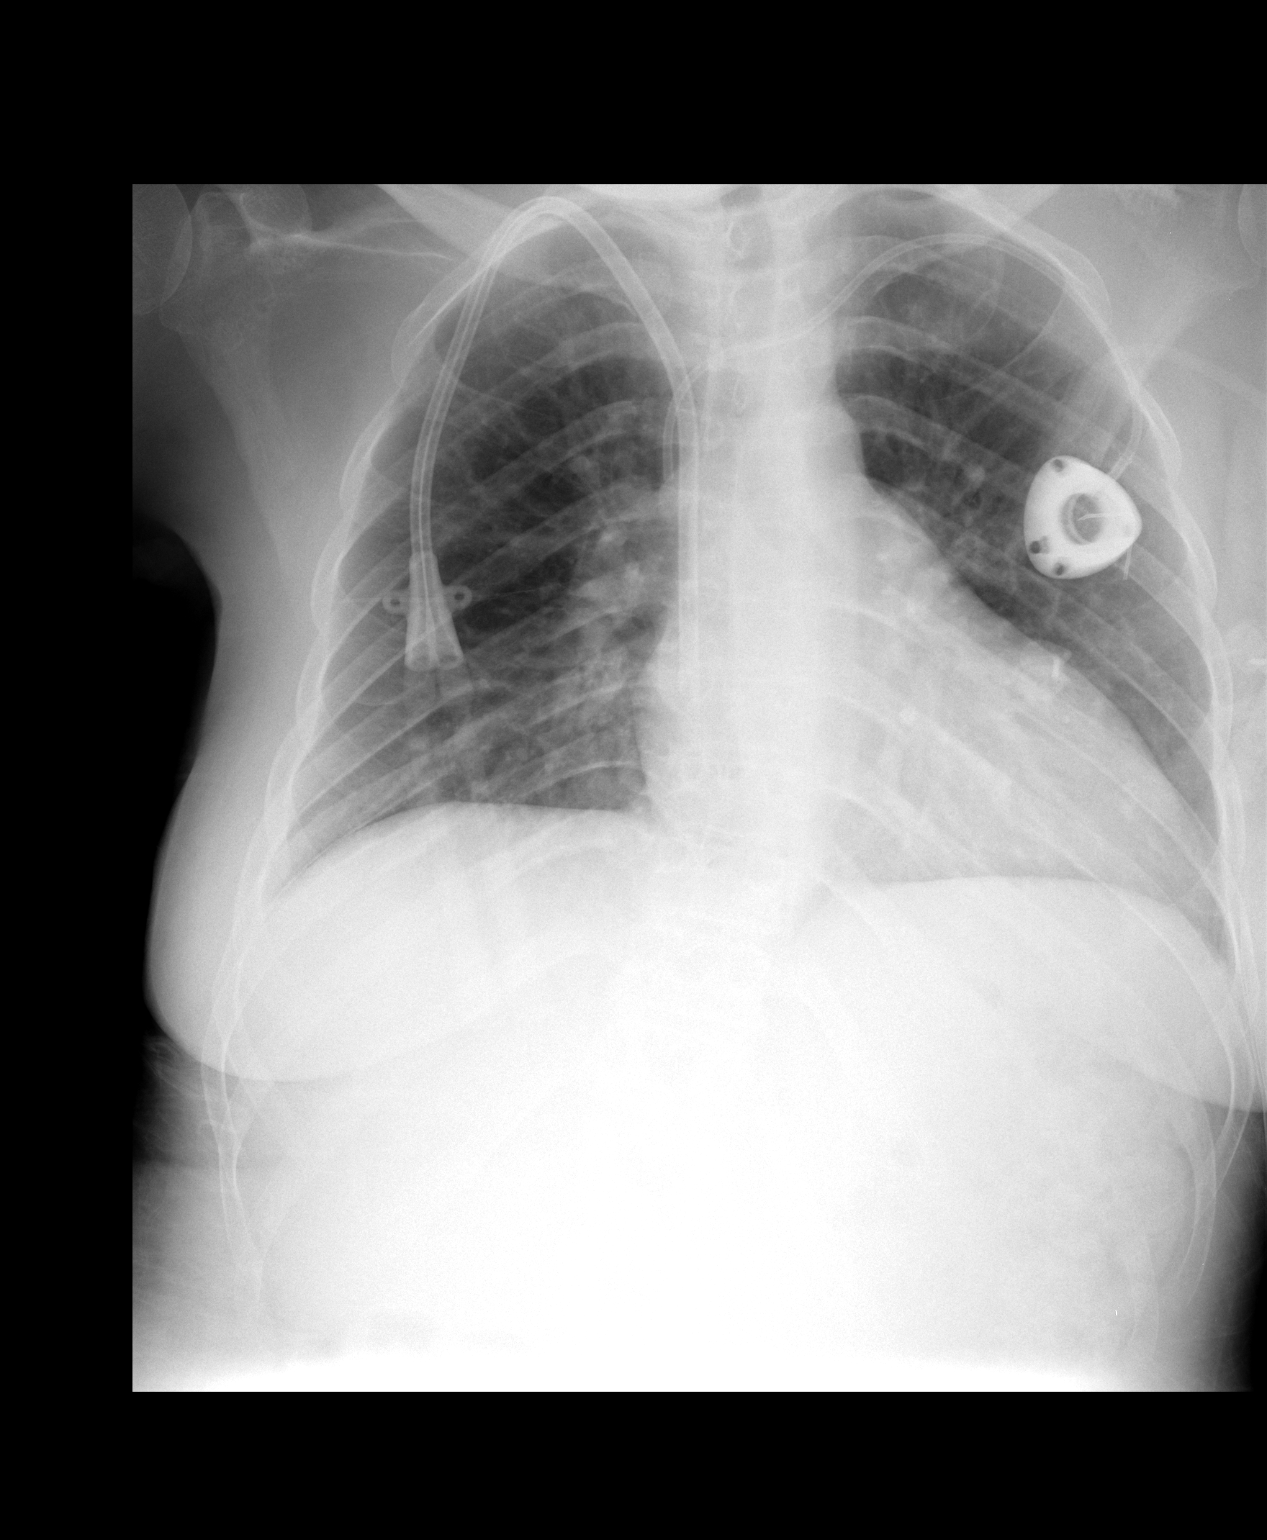

[1 of 1 positions shown; findings below may reference images not displayed]

FINDINGS: Stable appearance of bilateral central venous catheters.
Shallow inspiration.  Mild cardiac enlargement with borderline
pulmonary vascularity.  No blunting of costophrenic angles.  No
focal airspace consolidation.  No pneumothorax.  No significant
changes since the previous study.
IMPRESSION: No evidence of active pulmonary disease.  Mild cardiac enlargement.

## 2013-07-17 IMAGING — CT CT ABD-PELV W/ CM
2 of 3 series · 16 of 46 positions shown, 18 images · IV contrast (Omnipaque 300)
Comparison: 06/28/2011

CLINICAL DATA: Abdominal pain and fever.  End-stage renal disease.

CT ABDOMEN AND PELVIS WITH CONTRAST
TECHNIQUE: Multidetector CT imaging of the abdomen and pelvis was
performed following the standard protocol during bolus
administration of intravenous contrast.
Contrast: 100mL OMNIPAQUE IOHEXOL 300 MG/ML  SOLN

[Series 2: abd_pel_with 5.0 b40f · axial · 0.75mm/px · z∈[-416,-40]mm · 13 of 87 slices shown, 15 images]
[im 6/87  soft-tissue]
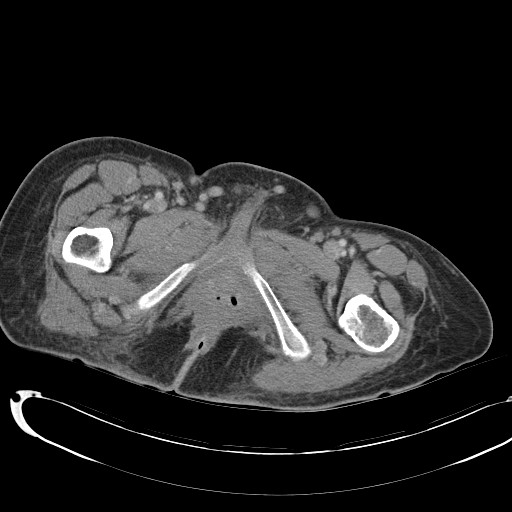
[im 6/87  bone]
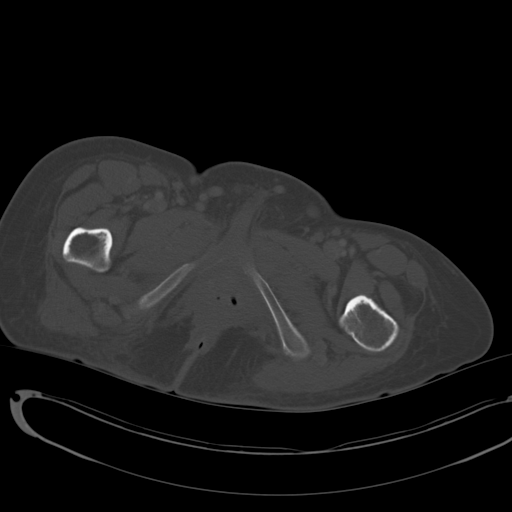
[im 12/87  soft-tissue]
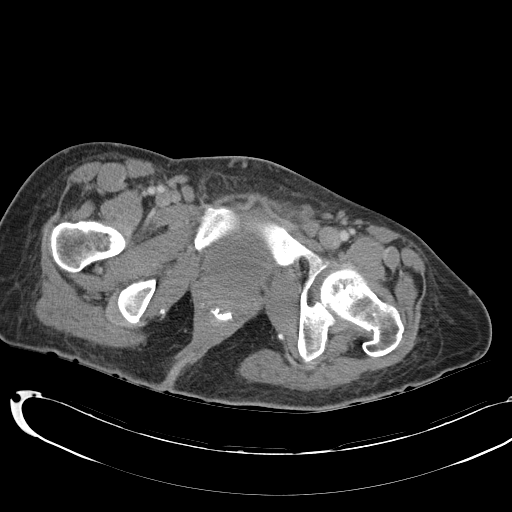
[im 17/87  soft-tissue]
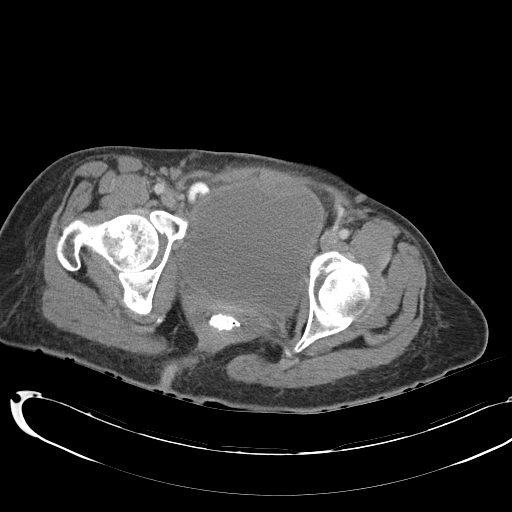
[im 25/87  soft-tissue]
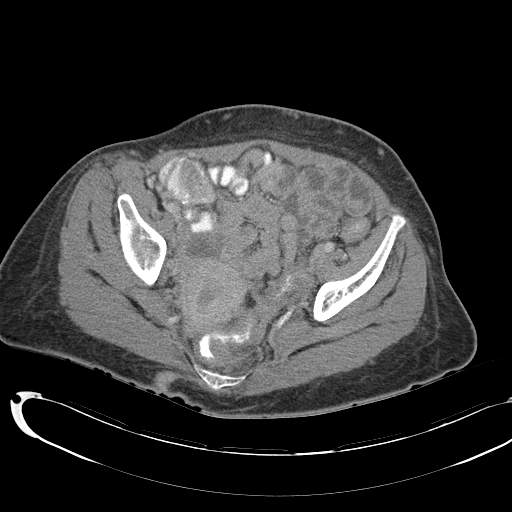
[im 31/87  soft-tissue]
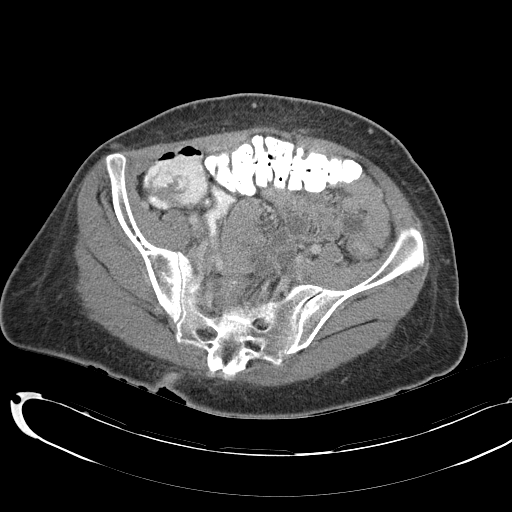
[im 37/87  soft-tissue]
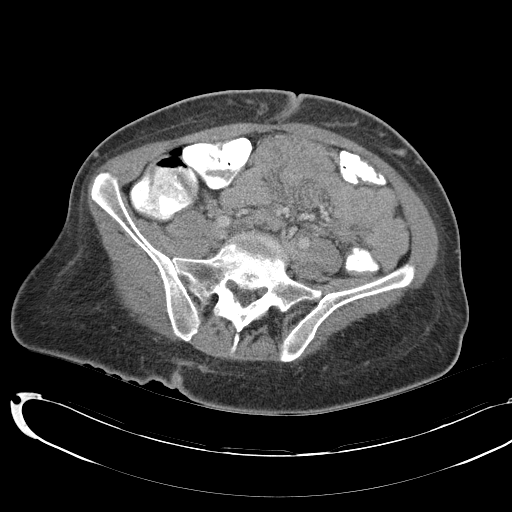
[im 45/87  soft-tissue]
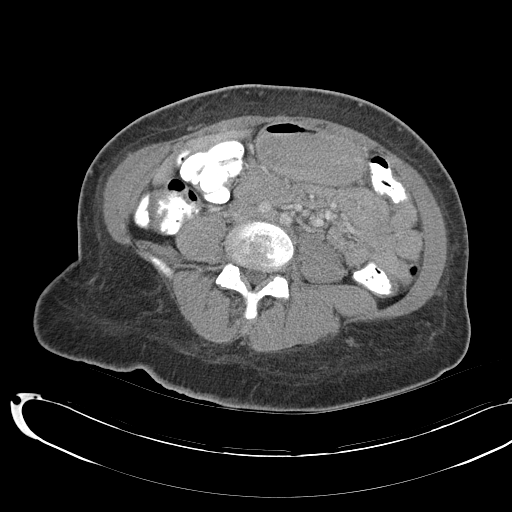
[im 50/87  soft-tissue]
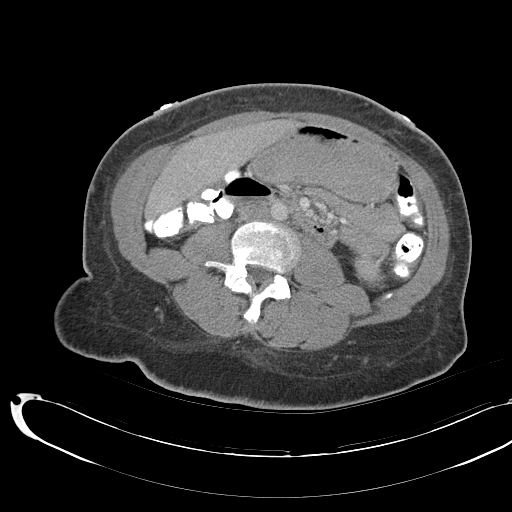
[im 56/87  soft-tissue]
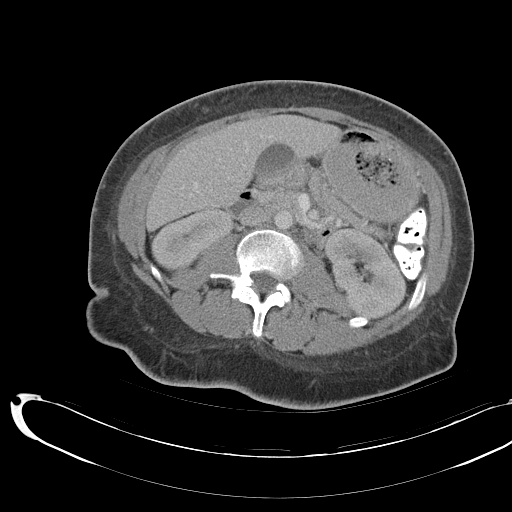
[im 56/87  bone]
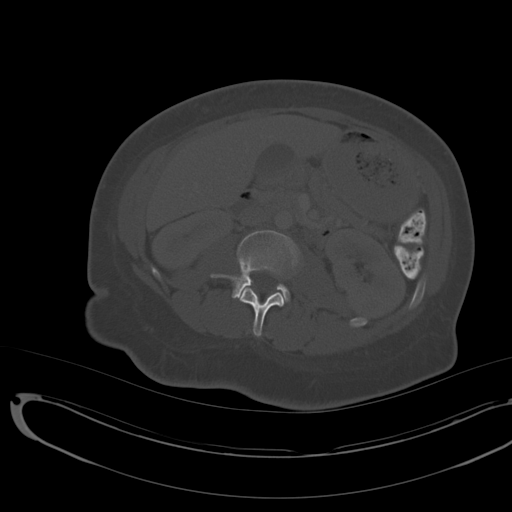
[im 62/87  soft-tissue]
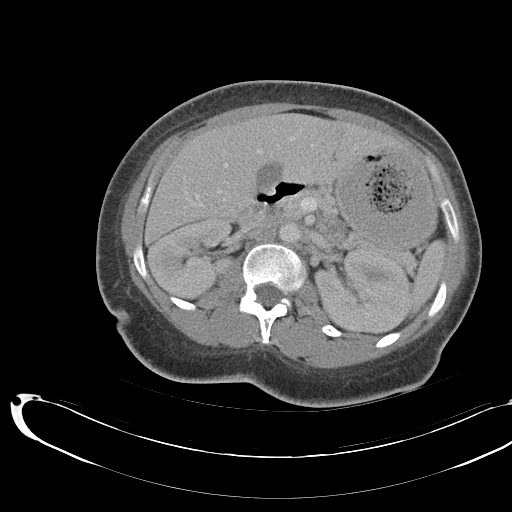
[im 70/87  soft-tissue]
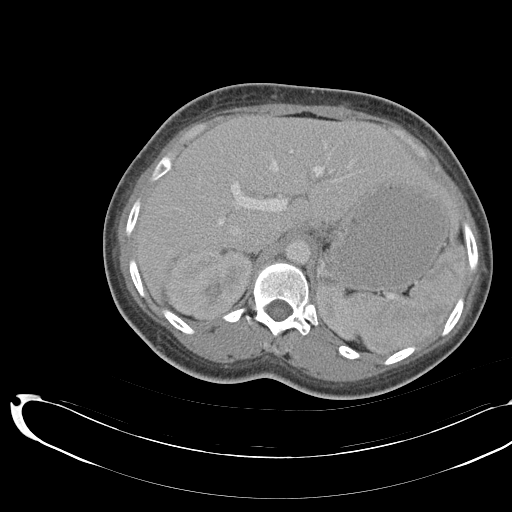
[im 75/87  soft-tissue]
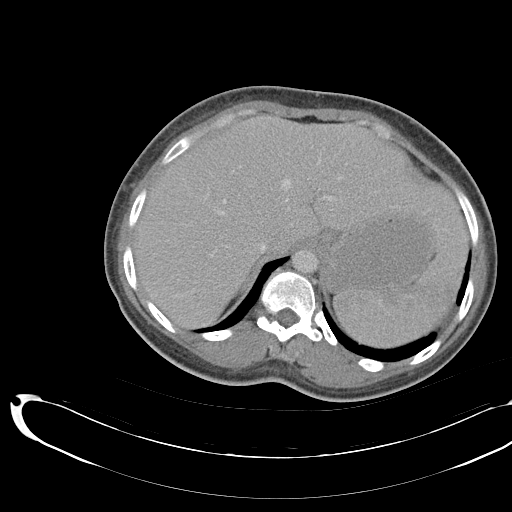
[im 81/87  soft-tissue]
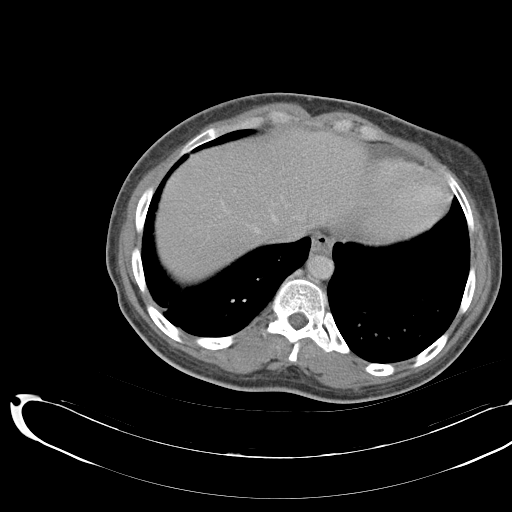

[Series 4: abd_pel_with 3.0 spo · coronal · 0.59mm/px · 3 of 74 slices shown]
[im 25/74  soft-tissue]
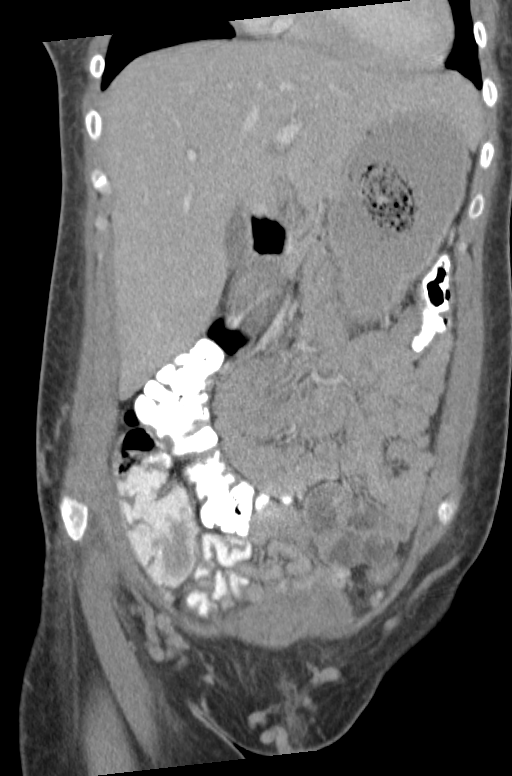
[im 33/74  soft-tissue]
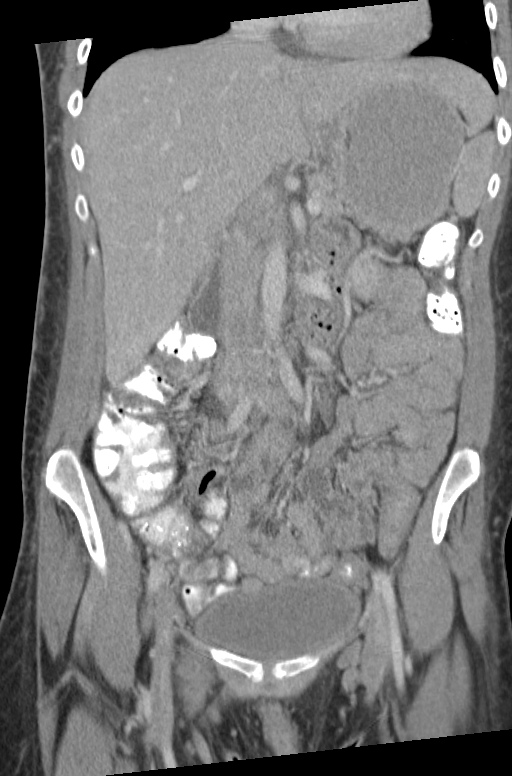
[im 41/74  soft-tissue]
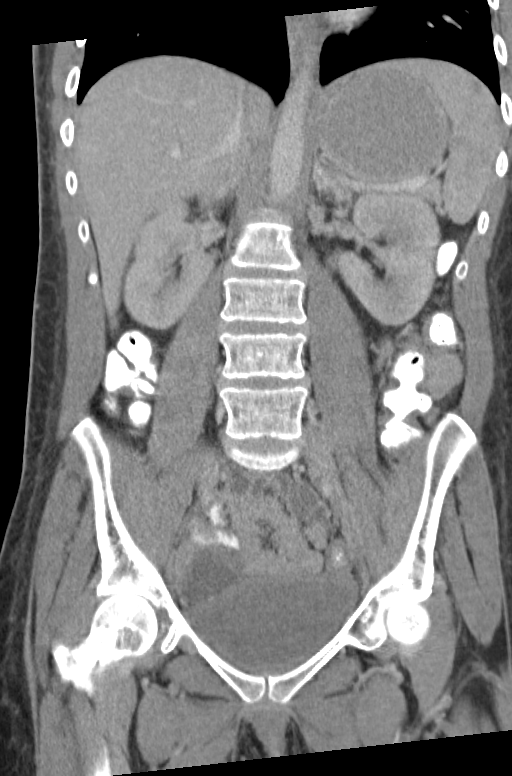

[16 of 46 positions shown; findings below may reference images not displayed]

FINDINGS: There is prominent hepatomegaly.  No focal lesions in the
liver.  No dilated bile ducts.  Gallbladder is normal.

The spleen, pancreas, adrenal glands, and kidneys are normal.
Terminal ileum and appendix are normal.  Small bowel is normal.

Again noted is a 3.7 cm cyst on the right ovary, unchanged.  Uterus
is normal.  Left ovary is not discretely identified.

There is slight prominence of the mucosa of the distal colon but
the colon is collapsed.  There is no pericolonic inflammation.  No
acute osseous abnormalities.
IMPRESSION: 1.  Hepatomegaly.
2.  Stable cyst on the right ovary.
3.  Slight prominence of the mucosa of the distal colon. Distal
colon is decompressed in this may be purely be due to the lack of
distention but colitis should be considered.

## 2013-08-03 ENCOUNTER — Observation Stay: Payer: Self-pay | Admitting: Internal Medicine

## 2013-08-03 LAB — CBC WITH DIFFERENTIAL/PLATELET
BASOS ABS: 0.1 10*3/uL (ref 0.0–0.1)
BASOS PCT: 1.2 %
EOS ABS: 0.2 10*3/uL (ref 0.0–0.7)
EOS PCT: 4 %
HCT: 38.3 % (ref 35.0–47.0)
HGB: 12.2 g/dL (ref 12.0–16.0)
LYMPHS PCT: 22.8 %
Lymphocyte #: 1.4 10*3/uL (ref 1.0–3.6)
MCH: 32.4 pg (ref 26.0–34.0)
MCHC: 31.9 g/dL — ABNORMAL LOW (ref 32.0–36.0)
MCV: 101 fL — ABNORMAL HIGH (ref 80–100)
Monocyte #: 0.5 x10 3/mm (ref 0.2–0.9)
Monocyte %: 7.9 %
NEUTROS PCT: 64.1 %
Neutrophil #: 3.8 10*3/uL (ref 1.4–6.5)
Platelet: 282 10*3/uL (ref 150–440)
RBC: 3.77 10*6/uL — ABNORMAL LOW (ref 3.80–5.20)
RDW: 18.4 % — ABNORMAL HIGH (ref 11.5–14.5)
WBC: 6 10*3/uL (ref 3.6–11.0)

## 2013-08-03 LAB — COMPREHENSIVE METABOLIC PANEL
ALT: 26 U/L (ref 12–78)
ANION GAP: 7 (ref 7–16)
AST: 32 U/L (ref 15–37)
Albumin: 3.5 g/dL (ref 3.4–5.0)
Alkaline Phosphatase: 302 U/L — ABNORMAL HIGH
BUN: 56 mg/dL — AB (ref 7–18)
Bilirubin,Total: 0.5 mg/dL (ref 0.2–1.0)
CO2: 29 mmol/L (ref 21–32)
Calcium, Total: 8.7 mg/dL (ref 8.5–10.1)
Chloride: 99 mmol/L (ref 98–107)
Creatinine: 10.88 mg/dL — ABNORMAL HIGH (ref 0.60–1.30)
GFR CALC AF AMER: 5 — AB
GFR CALC NON AF AMER: 4 — AB
Glucose: 93 mg/dL (ref 65–99)
Osmolality: 285 (ref 275–301)
Potassium: 6 mmol/L — ABNORMAL HIGH (ref 3.5–5.1)
SODIUM: 135 mmol/L — AB (ref 136–145)
TOTAL PROTEIN: 9.3 g/dL — AB (ref 6.4–8.2)

## 2013-08-03 LAB — LIPASE, BLOOD: Lipase: 212 U/L (ref 73–393)

## 2013-08-03 LAB — PHOSPHORUS: Phosphorus: 7.7 mg/dL — ABNORMAL HIGH (ref 2.5–4.9)

## 2013-08-04 LAB — CBC WITH DIFFERENTIAL/PLATELET
Basophil #: 0 10*3/uL (ref 0.0–0.1)
Basophil %: 0.7 %
Eosinophil #: 0.2 10*3/uL (ref 0.0–0.7)
Eosinophil %: 4.7 %
HCT: 35.6 % (ref 35.0–47.0)
HGB: 11.7 g/dL — ABNORMAL LOW (ref 12.0–16.0)
LYMPHS PCT: 35 %
Lymphocyte #: 1.4 10*3/uL (ref 1.0–3.6)
MCH: 33.4 pg (ref 26.0–34.0)
MCHC: 32.9 g/dL (ref 32.0–36.0)
MCV: 102 fL — ABNORMAL HIGH (ref 80–100)
MONOS PCT: 9.4 %
Monocyte #: 0.4 x10 3/mm (ref 0.2–0.9)
NEUTROS PCT: 50.2 %
Neutrophil #: 2 10*3/uL (ref 1.4–6.5)
Platelet: 245 10*3/uL (ref 150–440)
RBC: 3.49 10*6/uL — ABNORMAL LOW (ref 3.80–5.20)
RDW: 18.7 % — ABNORMAL HIGH (ref 11.5–14.5)
WBC: 4 10*3/uL (ref 3.6–11.0)

## 2013-08-04 LAB — BASIC METABOLIC PANEL
Anion Gap: 5 — ABNORMAL LOW (ref 7–16)
BUN: 32 mg/dL — ABNORMAL HIGH (ref 7–18)
Calcium, Total: 8.6 mg/dL (ref 8.5–10.1)
Chloride: 98 mmol/L (ref 98–107)
Co2: 32 mmol/L (ref 21–32)
Creatinine: 7.31 mg/dL — ABNORMAL HIGH (ref 0.60–1.30)
EGFR (African American): 8 — ABNORMAL LOW
EGFR (Non-African Amer.): 7 — ABNORMAL LOW
Glucose: 153 mg/dL — ABNORMAL HIGH (ref 65–99)
Osmolality: 280 (ref 275–301)
Potassium: 4.9 mmol/L (ref 3.5–5.1)
SODIUM: 135 mmol/L — AB (ref 136–145)

## 2013-08-04 LAB — MAGNESIUM: Magnesium: 2.3 mg/dL

## 2013-08-04 LAB — SEDIMENTATION RATE: Erythrocyte Sed Rate: 63 mm/hr — ABNORMAL HIGH (ref 0–20)

## 2013-08-05 LAB — POTASSIUM: Potassium: 4.9 mmol/L (ref 3.5–5.1)

## 2013-08-05 LAB — PHOSPHORUS: PHOSPHORUS: 7.5 mg/dL — AB (ref 2.5–4.9)

## 2013-09-15 ENCOUNTER — Emergency Department: Payer: Self-pay | Admitting: Emergency Medicine

## 2014-05-24 NOTE — Op Note (Signed)
PATIENT NAME:  Samantha MaineLAWSON, Danielly A MR#:  [id number] DATE OF BIRTH:  [birth year]  DATE OF PROCEDURE:  12/23/2011  PREOPERATIVE DIAGNOSES:  1. Endstage renal disease.  2. Thrombosed left arm AV graft.  3. Severe hypertension.  4. Diabetes.   POSTOPERATIVE DIAGNOSES: 1. Endstage renal disease.  2. Thrombosed left arm AV graft.  3. Severe hypertension.  4. Diabetes.      PROCEDURES:  1. Ultrasound guidance for vascular access to left arm AV graft x2.  2. Left upper extremity shuntogram and central venogram.  3. Catheter directed thrombolysis with 4 mg of TPA delivered with the AngioJet AVX catheter in a power pulse spray fashion.  4. Percutaneous transluminal angioplasty of arterial anastomosis with 5 mm diameter angioplasty balloon for arterial plug.  5. Percutaneous transluminal angioplasty of mid and distal portions of graft and venous anastomosis with 8 millimeter diameter angioplasty balloon.   SURGEON: Annice NeedyJason S. Dew, M.D.   ANESTHESIA: Local with moderate conscious sedation.   ESTIMATED BLOOD LOSS: Approximately 25 mL. CONTRAST USED: 25 mL Visipaque.  FLUOROSCOPY TIME: Approximately five minutes.   INDICATION FOR PROCEDURE: This is a 29 year old African American female with end-stage renal disease. Her left arm AV graft is clotted. We are attempting to salvage this.   DESCRIPTION OF PROCEDURE: The patient was brought to the vascular interventional radiology suite. The left upper extremity was sterilely prepped and draped and a sterile surgical field was created. Due to the pulseless nature of the graft, ultrasound was used to access the graft in both an antegrade and retrograde fashion, crossing due to the pulseless nature of the graft and permanent image was recorded. 6 French sheaths were placed bilaterally. The patient was given 3,000 units of intravenous heparin. The graft was clotted on the initial imaging. I parked wires into the central venous circulation and to the brachial  artery with the help of a Kumpe catheter and then instilled 4 mg of TPA in a power pulse spray fashion from the arterial anastomosis into the axillary vein throughout the graft. This was allowed to dwell. Imaging and covered arterial plug was treated with a 5 mm diameter angioplasty balloon proximally with good angiographic result in the proximal portion. There was still residual thrombus in the midportion of the graft as well as a tight venous anastomotic stenosis. I treated from the midportion of the graft throughout the remainder of the graft and into the axillary vein with an 8 mm diameter angioplasty balloon with some waste taken along the way including particularly tight waste at the venous anastomosis which resolved with angioplasty. Completion shuntogram following this showed some mild residual stenosis of the venous anastomosis in the 20% to 30% range which was not flow limiting with a patent graft and a patent central venous circulation. At this point, I elected to terminate the procedure. Both sheaths were removed around a 4-0 Monocryl pursestring suture. Pressure was held. Sterile dressing was placed. The patient tolerated the procedure well was taken to the recovery room in stable condition.  ____________________________ Annice NeedyJason S. Dew, MD jsd:ap D: 12/23/2011 11:50:29 ET T: 12/23/2011 12:02:55 ET JOB#: [id number]  cc: Annice NeedyJason S. Dew, MD, <Dictator> Annice NeedyJASON S DEW MD ELECTRONICALLY SIGNED 12/24/2011 15:04

## 2014-05-24 NOTE — Op Note (Signed)
PATIENT NAME:  [name], [name] MR#:  [id number] DATE OF BIRTH:  1985-12-16  DATE OF PROCEDURE:  09/27/2011  PREOPERATIVE DIAGNOSIS: End-stage renal disease requiring hemodialysis.   POSTOPERATIVE DIAGNOSIS: End-stage renal disease requiring hemodialysis.  PROCEDURE PERFORMED: Left brachial axillary dialysis graft placement.   SURGEON: Renford Dills, MD   ANESTHESIA: General by endotracheal intubation.   FLUIDS: Per anesthesia record.   ESTIMATED BLOOD LOSS: Minimal.   SPECIMEN: None.   INDICATIONS: [name] is a 29 year old woman who presented to the office with lack of dialysis access. She is currently maintained on a dialysis catheter. Risks and benefits for creation of upper extremity access were reviewed with the patient. Vein mapping did not show adequate vein and she is, therefore, undergoing placement of a graft. Risks and benefits were reviewed. The patient agrees to proceed.   PROCEDURE: The patient is taken to the operating room and placed in the supine position. After adequate general anesthesia is induced and appropriate invasive monitors are placed, she is positioned supine with her left arm extended palm upward. Left arm is then prepped and draped in a sterile fashion. 0.25% Marcaine is then infiltrated in the soft tissues overlying the brachial impulse near the antecubital fossa up in the anterior axillary line and along the course of the presumed graft placement.   A linear incision is then made overlying the brachial impulse and the dissection is carried down through soft tissues. Initially a small artery is identified, however, further manipulation of the tissues suggests that this is a high takeoff of the radial artery. Dissection is then carried down more deeply and a brachial artery is identified. This too appears quite small but it is significantly larger than the initial encountered radial artery. Brachial artery is then dissected circumferentially and vessel  loops are placed.   Incision is then made in the anterior axillary line and carried down through the soft tissues to expose the sheath which is incised and the axillary vein is exposed. It is looped proximally and distally.   4 to 7 mm tapered PTFE Gore graft is opened onto the field. Gore tunneler is then passed subcutaneously from the arterial to the venous anastomosis and the graft is pulled subcutaneously. Tunneler is removed. The brachial artery is delivered into the field, arteriotomy is made, and [facility] coronary dilators are used to dilate the artery up to 4 mm proximally and up to 3 mm distally. It is then flushed with heparinized saline. Stay sutures of 6-0 Prolene are placed, graft is trimmed to an appropriate bevel, and an end graft to side brachial artery anastomosis is fashioned using running CV-6 suture. Flushing maneuvers are performed. The graft is pressurized. It is palpated and found to be easy to feel under the skin, well placed. It is, therefore, flushed with heparinized saline and clamped just above the suture line at the arterial end. It is then approximated to the vein in the native bed, transected, and the vein is delivered into the surgical field with silastic vessel loops. Venotomy is made, extended with Potts scissors. Stay suture of 6-0 Prolene is placed. Graft is beveled to an appropriate length and an end graft to side vein anastomosis is fashioned with running CV-6 suture. Flushing maneuvers are performed and flow is established through the graft. Soft thrill is noted, maintenance of the radial pulse is identified, and both anastomoses are inspected and found to be hemostatic. Both wounds are irrigated and then closed in layers using 3-0 Vicryl followed by  4-0 Monocryl subcuticular and Dermabond. The patient tolerated the procedure well. There were no complications. Sponge and needle counts were correct x2. She was taken to the recovery area in excellent condition.    ____________________________ Renford DillsGregory G. Rebekah Zackery, MD ggs:drc D: 09/27/2011 20:39:10 ET T: 09/28/2011 08:50:52 ET JOB#: [id number]  cc: Renford DillsGregory G. Amour Cutrone, MD, <Dictator> Dr. Kristian CoveyBefekadu, nephrologist in [place]  Evah Rashid G Belal Scallon MD ELECTRONICALLY SIGNED 10/01/2011 10:54

## 2014-05-27 NOTE — Consult Note (Signed)
PATIENT NAME:  Samantha [name], Samantha [name] MR#:  [id number] DATE OF BIRTH:  [birth year]  DATE OF CONSULTATION:  09/09/2012  CONSULTING PHYSICIAN:  Kyung Ruddreighton C. Khaleelah Yowell, MD  REFERRING PHYSICIAN:  Dr.  Jene Everyobert Kinner   REASON FOR CONSULTATION:  Facial abscess.   HISTORY OF PRESENT ILLNESS: The patient is [name] 29 year old female with multiple medical comorbidities, with history of cardiomegaly, diabetes and end-stage renal disease, as well as MRSA. Found to have multiple abscesses, one on the left face, approximately 3 x 3 cm in size. This had been present for several days and increased in size, with increase in pain. The patient did have some mild amount of drainage from the abscess site. She was admitted and evaluated by General Surgery, noted to have multiple abscesses on her lower extremities and lower trunk, and requiring drainage of those as well. The patient also has [name] history of MRSA, and is currently being given vancomycin.   PAST MEDICAL HISTORY:  MRSA, anemia, cardiomyopathy, arrhythmia, blindness, seizure disorder, diabetes mellitus, end-stage renal failure on dialysis.   PAST SURGICAL HISTORY:  Incision and drainage, catheter insertion for dialysis, and right brachial fistula.   SOCIAL HISTORY:  [place] Is currently disabled. The patient denies any excessive tobacco or alcohol use.  FAMILY HISTORY:  Noncontributory.   ALLERGIES:  MORPHINE, SULFA, KETCHUP, ANCEF.  CURRENT MEDICATIONS:  Dilaudid, Zofran and vancomycin.   HOME MEDICATIONS:  Vistaril, trazodone, Renvela, Lantus, labetalol, Keppra, Humalog, Fluoxetine, Dilantin and benazepril.   PHYSICAL EXAMINATION:  GENERAL:  She is well-nourished, no acute distress, lying supine in bed.  HEENT:  The patient is blind, but alert and responsive to questions. External ears are clear, with no otorrhea. Nose is clear anteriorly. No mucopus or polyps. Oral cavity and oropharynx reveals no abnormal masses or  lesions. Neck is supple. There is an IV in the right neck. Face:  She has [name] 3 x 3 cm area of induration with [name] central area of pustule, with active purulence coming out.  VITAL SIGNS: Temperature is 98.2, pulse is 98, respirations are 18, blood pressure 196/100.   LABS:  CBC was performed, which reveals [name] white count of 7.1 and hemoglobin of 13.1.   PROCEDURE:  Incision and drainage of [name] left-sided facial abscess.   PREPROCEDURE DIAGNOSIS: Left facial abscess.  POSTPROCEDURE DIAGNOSIS:  Left facial abscess.    DESCRIPTION OF PROCEDURE: After verbal consent was obtained, the patient was placed in appropriate position in the exam room chair. The left face was prepped with iodine and 1 mL of 1% lidocaine with 1:100,000 epinephrine was injected into the patient's left anterior face at abscess site using [name] 30-gauge needle. An 18-gauge needle was then inserted into the patient's abscess site, and frank purulence was aspirated. At this time, the previous area of drainage abscess site was enlarged with sterile hemostat, and internal septations of the left facial abscess were broken up, and another 5 mL of purulent drainage was expressed from the aspiration area. This was dressed with bacitracin ointment and topped with [name] 4 x 4.  IMPRESSION: Left-sided facial abscess, with history of methicillin-resistant staphylococcus aureus.   PLAN:  Follow the culture results. I would anticipate improvement.   I will follow along.   ____________________________ Kyung Ruddreighton C. Tyaisha Cullom, MD ccv:mr D: 09/09/2012 17:40:00 ET T: 09/09/2012 19:34:24 ET JOB#: [id number]  cc: Kyung Ruddreighton C. Elliyah Liszewski, MD, <Dictator> Kyung RuddREIGHTON C Johnni Wunschel MD ELECTRONICALLY SIGNED 09/10/2012 12:27

## 2014-05-27 NOTE — Consult Note (Signed)
PATIENT NAME:  [name], [name] MR#:  [id number] DATE OF BIRTH:  [birth year]  DATE OF CONSULTATION:  09/10/2012  CONSULTING PHYSICIAN:  Cristal Deer A. Maurisio Ruddy, MD  REASON FOR CONSULTATION: Suprapubic and groin abscesses.   HISTORY OF PRESENT ILLNESS: [name] is a pleasant 29 year old female with history of nephrotic syndrome on dialysis, diabetes, seizures, and blindness, who was sent to urgent care yesterday with multiple abscesses. She says that the ones on her face and in her groin/suprapubic regions became noticeable approximately a week ago and they have gotten increasingly larger and tender. She has also had intermittent night sweats, fevers and chills as well as over the last week nausea, vomiting, anorexia, and feeling weak.   She has a history of MRSA abscesses on her bottom before. Since she has been here, she has been treated with IV antibiotics and did have an I and D of her facial abscess. She says that her groin abscesses are extremely large and painful. They have not had any obvious drainage.   Otherwise no headaches, chest pain, shortness of breath, cough, current nausea, vomiting, diarrhea, constipation, dysuria, or hematuria.   PAST MEDICAL HISTORY:  1.  History of MRSA sepsis.  2.  Anemia.  3.  Seizure disorder.  4.  Diabetes.  5.  Hypertension.  6.  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  7.  History of DVTs.  8.  History of end-stage renal disease from nephrotic syndrome on dialysis.  9.  Charcot foot.  10.  History of major depressive disorder.  11.  Depression.  12.  Anxiety.  13.  History of AV fistulas now with a graft.  14.  History of a cyst removed from lung.   FAMILY HISTORY: Diabetes as well as end-stage renal disease in an aunt.   SOCIAL HISTORY: Denies tobacco, alcohol or drug use. Lives in [place] in [place].   HOME MEDICATIONS: 1.  Ativan.  2.  Clonidine.  3.  Fluoxetine. 4.  Gabapentin.   5.  Keppra.   6.  Klonopin.  7.  Labetalol.  8.  Lantus.  9.  Lasix.  10.  Phenytoin   11. Renvela.   REVIEW OF SYSTEMS: A 12-point review of systems was obtained. Pertinent positives and negatives as above.   PHYSICAL EXAM:  VITAL SIGNS: Temperature 98.1, pulse 97, blood pressure 99/65, respirations 18, 98% on room air.  GENERAL: No acute distress. Alert and oriented x3.  HEAD: Normocephalic, atraumatic.  EYES: Legally blind. Does not track. Has white covering over lens and cornea.  FACE:no obvious facial trauma.  Has gauze over left cheek abscess site CHEST: Lungs clear to auscultation. Moving air well.  HEART: Regular rate and rhythm. No murmurs, rubs, or gallops.  ABDOMEN: Soft, nontender, nondistended. Does have multiple lesions above her labia and her suprapubic region. One is very thin walled and appears ready to burst. One is on the right and is  palpable and extremely tender. She also has a large one laterally on her left side which is tender.  EXTREMITIES: Moves all extremities well. Strength 5/5.  NEUROLOGIC: Cranial nerves III through XII grossly intact. Sensation intact in all four extremities.   LABORATORY DATA: Currently has a white blood cell count of 5.9, hemoglobin and hematocrit which are stable from admission. Neutrophils are 56%. Creatinine is 10.25 earlier today but underwent dialysis. BUN 51, phosphorus 7.1, potassium 4.6.   ASSESSMENT AND PLAN: [name] is a pleasant 29 year old female with multiple suprapubic abscesses. I  have offered to drain these and she would like them drained in the operating room. I have spoken to her about the procedure, and informed consent has been obtained. Will obtain labs in the morning to evaluate electrolyte levels and make sure she is safe for surgery. I have also asked for internal medicine assistance with perioperative blood sugar control.    ____________________________ Si Raiderhristopher A. Rilya Longo, MD cal:np D: 09/10/2012 21:11:06  ET T: 09/10/2012 22:45:09 ET JOB#: [id number]  cc: Cristal Deerhristopher A. Wilmina Maxham, MD, <Dictator> Jarvis NewcomerHRISTOPHER A Letty Salvi MD ELECTRONICALLY SIGNED 09/18/2012 10:50

## 2014-05-27 NOTE — Consult Note (Signed)
Brief Consult Note: Diagnosis: Non Cardiac Chest Pain.   Patient was seen by consultant.   Consult note dictated.   Orders entered.   Discussed with Attending MD.   Comments: IMP Non cardiac cp ESRD Blindness Narcotic abuse Hx MRSA Diastolic Heart failure . PLAN Conservative Medical therapy I do not rec Myoview for this non cardiac cp Continue pain control Rec f/u as outpt is pain continues Consider chronic pain management.  Electronic Signatures: Dorothyann Pengallwood, Xochil Shanker D (MD)  (Signed 03-Oct-14 15:35)  Authored: Brief Consult Note   Last Updated: 03-Oct-14 15:35 by Dorothyann Pengallwood, Berline Semrad D (MD)

## 2014-05-27 NOTE — Consult Note (Signed)
PATIENT NAME:  Samantha [name], Samantha [name] MR#:  [id number] DATE OF BIRTH:  [birth year]  DATE OF CONSULTATION:  10/05/2012  PRIMARY CARE PHYSICIAN: None. REFERRING PHYSICIAN:  Sharyn CreamerMark Quale, MD CONSULTING PHYSICIAN:  Emory Leaver S. Sherryll BurgerShah, MD  REASON FOR CONSULTATION: Lower extremity swelling.   HISTORY OF PRESENT ILLNESS: The patient is [name] 29 year old African American female with [name] known history of nephrotic syndrome, seizure disorder, legal blindness, and end-stage renal disease on hemodialysis Tuesday, Thursday, Saturday, who was seen in consultation for lower extremity swelling.   The patient did have her regular dialysis on Saturday but comes in today feeling that her legs were more swollen than usual with left leg feeling more heavier the last 2 to 3 days, making it difficult to walk. She denies any fever, abdominal pain or any other symptoms at this time.   PAST MEDICAL HISTORY:  1.  MRSA abscesses.  2.  Chronic anemia.  3.  Seizure disorder.  4.  Diabetes.  5.  Hypertension.  6.  Legal blindness.  7.  Nephrotic syndrome.  8.  End-stage renal disease on hemodialysis, Tuesday, Thursday, Saturday.  9.  History of Charcot foot.  10.  Major depression.  11.  Anxiety.  12.  Depression.   13.  Possible opioid abuse.   PAST SURGICAL HISTORY:  Multiple dialysis access procedures.  Failed left-sided AV fistula. Now has [name] graft.  History of methicillin-resistant Staphylococcus aureus abscesses which required drainage.  History of cyst removal from the lungs.   ALLERGIES: ANCEF, MORPHINE, SULFA, AND TOMATO CATSUP.   SOCIAL HISTORY: No tobacco, alcohol or drug use.   FAMILY HISTORY: Positive for diabetes.   MEDICATIONS AT HOME:  Based on recent discharge: Renvela 800 mg three times daily.  Gabapentin 300 mg two times daily.  Lasix 40 mg p.o. daily.  Labetalol 300 mg p.o. b.i.d.  Keppra 500 mg p.o. daily.  Trazodone 100 mg p.o. daily.  Ativan 0.5 mg p.o. 3 times [name] day.  Fluoxetine 40 mg p.o.  daily.  Clonidine 0.2 mg p.o. 3 times [name] day.  Dilantin 100 mg three capsules.  Acetaminophen 325 mg p.o. daily as needed.  Norco 10/325 mg one tablet daily as needed for pain.  MiraLax twice daily as needed.  Simethicone 80 mg p.o. daily.  Hydralazine 50 mg p.o. 4 times [name] day.  Ciprofloxacin as prescribed on last discharge to take after hemodialysis days.   REVIEW OF SYSTEMS: CONSTITUTIONAL: No fever. Positive fatigue and weakness.  EYES: Legal blindness.  ENT: No tinnitus or ear pain.  RESPIRATORY: No cough, wheezing, hemoptysis.  CARDIOVASCULAR: No chest pain, orthopnea. Positive for subjective lower extremity edema.  GASTROINTESTINAL: No nausea, vomiting, diarrhea.  GENITOURINARY: No dysuria or hematuria.  ENDOCRINE: No polyuria or nocturia.  HEMATOLOGY: No anemia or easy bruising.  SKIN: No rash or lesion.  MUSCULOSKELETAL: No arthritis or muscle cramp.  NEUROLOGIC: No tingling, numbness, or weakness.  PSYCHIATRIC: History of anxiety and depression.   PHYSICAL EXAMINATION:  VITAL SIGNS: Temperature 97.8, heart rate 98 per minute, respirations 18 per minute, blood pressure 145/83 mmHg. She is saturating 97% on room air.  GENERAL: [name] 29 year old year-old African American female lying in the bed comfortably without any acute distress.  EYES: Legally blind. Discoloration of her right pupil and sclera. Moist mucous membranes.  HEENT: Head atraumatic, normocephalic. Oropharynx and nasopharynx clear.  NECK: Supple. No jugular venous distention. No thyroid enlargement or tenderness.  LUNGS: Clear to auscultation bilaterally. No wheezing, rales, rhonchi or crepitation.  CARDIOVASCULAR: S1,  S2 normal. No murmurs, rubs, or gallop.  ABDOMEN: Soft, nontender, nondistended. Bowel sounds present. No organomegaly or masses.  EXTREMITIES: Trace to 1+ pedal edema. No cyanosis or clubbing.  NEUROLOGIC: Cranial nerves II through XII are intact. Muscle strength 5/5 in all extremities. Sensation  intact. She would not cooperate with exam. Seemed quite irritated. Gait not checked. PSYCHIATRIC: Seems anxious and very annoyed.  MUSCULOSKELETAL: Good lower extremity pulsations.  SKIN: No obvious rash, lesion, or ulcer.  LABORATORY AND RADIOLOGICAL DATA:  1.  Normal BMP except BUN of 31, creatinine 8.06. Blood sugar 138. BNP of 4822.  2.  Normal CBC except hemoglobin of 10.2, hematocrit 30.2.  3.  Chest x-ray in the ED shows no acute cardiopulmonary disease.  4.  Left lower extremity Doppler showed no evidence of DVT.   IMPRESSION AND PLAN:  1.  Lower extremity edema. This seems more chronic. At this point, I do not see any urgent indication for dialysis. I have discussed the case with nephrology, Dr. Wynelle Link, who agrees with the same. She is not hyperkalemic, not acidotic, and her regular dialysis is already scheduled for tomorrow, which she can have it. I do not think see any indication for inpatient admission at this point. This was communicated to the Emergency Room doctor.  2.  Hypertension. We will recommend resuming her home medications. Blood pressure seems fairly stable.  3.  End-stage renal disease on dialysis, Tuesday, Thursday, Saturday. Her next scheduled dialysis is for tomorrow which she can have an outpatient.  4.  Diabetes. She can resume her home insulin regimen.  5. Ambulatory dysfunction. She normally does use some assistive devices which she can continue using. I do not see any obvious fracture in her extremities,  she doesn't have any blood clot on dopplers. Her leg swelling seems chronic in nature. Maybe somewhat more fluid which can be removed tomorrow on regular dialysis days. Again at this point she does not need admission Criteria. this was discussed with the patient and Emergency Room physician, Dr. Sharyn Creamer. She can have outpatient dialysis tomorrow as scheduled with [facility] as scheduled. This was discussed with Dr. Wynelle Link also who is in agreement.   Total time  taking care of this patient was 40 minutes.   ____________________________ Ellamae Sia. Sherryll Burger, MD vss:np D: 10/05/2012 21:23:00 ET T: 10/05/2012 21:36:45 ET JOB#: [id number]  cc: Livy Ross S. Sherryll Burger, MD, <Dictator> Primary care physician at [facility] Laverda Sorenson, MD Sheran Fava. Fanny Bien, MD  Patricia Pesa MD ELECTRONICALLY SIGNED 10/08/2012 12:18

## 2014-05-27 NOTE — H&P (Signed)
PATIENT NAME:  [name], [name] MR#:  [id number] DATE OF BIRTH:  1985/08/04  DATE OF ADMISSION:  09/20/2012  PRIMARY CARE PROVIDER: None. She follows up with [facility].  ED REFERRING PHYSICIAN: Dr. Jimmye Norman.   CHIEF COMPLAINT: Abdominal pain, chills, tachycardia, elevated blood pressure.   HISTORY OF PRESENT ILLNESS: The patient is a 29 year old African American female with history of nephrotic syndrome who is on dialysis, has a history of seizure disorder, has legal blindness who was hospitalized here from 08/06 to 08/102014. The patient was admitted with multiple abscesses, MRSA in the face and the suprapubic area that were drained. The patient was discharged on 5 more doses of IV vancomycin. The patient reports that over the past few days she started having abdominal pain.  She thought it was maybe gas, so she took some over-the-counter medication but did not improve. She started having chills, and her blood pressure started was noticed to be in the 200s. Therefore, she came to the ED. In the ER, the patient was noted to be tachycardic and blood pressure systolics in the 408X/448. She was not noted to be febrile here. The wounds and abscesses that were drained, they did not appear to be infected and appeared clean. The patient also had a CT scan of the abdomen which showed nonspecific findings. Due to her chills, tachycardia, accelerated blood pressure, we were asked to admit the patient. The patient denies any chest pains, palpitations. No shortness of breath. No cough, no urinary symptoms.   PAST MEDICAL HISTORY: 1.  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  2.  History of chronic anemia.  3.  Seizure disorder.  4.  Diabetes.  5.  Hypertension.  6.  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  7.  History of nephrotic syndrome.  8.  End-stage renal disease on hemodialysis.  9.  History of Charcot foot. 10.  Major depressive  disorder.  11.  Anxiety.  12.  Depression.   PAST SURGICAL HISTORY: Multiple but does not know all of them, but has had multiple dialysis access procedures, failed left-sided AV fistula, now has a graft, history of MRSA abscesses drained, history of cyst removal from the lungs.   ALLERGIES: ANCEF, MORPHINE, SULFA DRUGS, TOMATO KETCHUP.   CURRENT MEDICATIONS: She is on acetaminophen/oxycodone 325/7.5,  1 tab p.o. q. 4 p.r.n., Ativan 1 mg p.o. b.i.d., clonidine 0.3, 1 tab p.o. t.i.d., fluoxetine 40 mg 1 tab p.o. daily, gabapentin 300, 1 tab p.o. b.i.d., Keppra 250, 2 tabs daily, labetalol 300, 1 tab p.o. t.i.d., Lantus 12 units subcutaneous at bedtime, Lasix 40, 1 tab p.o. b.i.d., phenytoin 100, 1 tab p.o. t.i.d., Renvela  800 mg 4 tabs 3 times a day with meals, 2 tabs with snack, vancomycin 750 with dialysis.   SOCIAL HISTORY: Denies any tobacco, alcohol or drug use.   FAMILY HISTORY: Diabetes.  REVIEW OF SYSTEMS:  CONSTITUTIONAL: Complains of chills, fatigue, weakness, abdominal pain. No weight loss or weight gain.  EYES: Has legal blindness.  EARS, NOSE AND THROAT: No tinnitus. No hearing loss. No nasal discharge. No snoring. No postnasal drip. No sinus pain. No difficulty swallowing.  RESPIRATORY: Denies any cough, wheezing. No hemoptysis. No COPD. No pneumonia.  CARDIOVASCULAR: Denies any chest pain, orthopnea, edema or arrhythmia. No syncope.  GASTROINTESTINAL: Complains of abdominal pain but no nausea, vomiting. No rectal bleeding. No changes in bowel habits.  GENITOURINARY: Denies any dysuria, hematuria, renal calculus or frequency.  ENDOCRINE: Denies any polyuria, nocturia or thyroid problems.  HEMATOLOGIC/LYMPHATIC: Denies any major bruisability or bleeding.  SKIN: No acne. Has abscesses that were recently drained.  MUSCULOSKELETAL: Denies any pain in the neck, back or shoulder.  NEUROLOGIC: No numbness. No CVA. No TIA or seizures.  PSYCHIATRIC: No anxiety. No insomnia. No ADD.    PHYSICAL EXAMINATION: VITAL SIGNS: Temperature 98.1, pulse 116, respirations 20, blood pressure of 205/118.  GENERAL: The patient is a well-developed [place] American female lying in bed.  HEAD, EYES, EARS, NOSE, THROAT: Normocephalic, atraumatic.  There is discoloration of the right pupil and sclera.  Moist mucous membranes.  NECK: Supple. No tenderness. No cervical lymphadenopathy.  CARDIOVASCULAR: S1, S2 positive. Tachycardic, no murmurs, rubs or gallops.  LUNGS: Clear to auscultation bilaterally without any rales, rhonchi, wheezing.  ABDOMEN: Soft. There is tenderness in the epigastric region. There is no guarding. No rebound.  EXTREMITIES: No significant lower extremity edema. She has suprapubic abscesses with dressing in place which was removed by the ED physician earlier without any significant drainage.  NEUROLOGICAL: Cranial nerves II through XII grossly intact. No focal deficits.  GENITOURINARY: Deferred.  MUSCULOSKELETAL: There is no erythema or swelling.  LYMPHATICS: No lymph nodes palpable.  VASCULAR: Good DP, PT pulses.  PSYCHIATRIC: Not anxious or depressed.   EVALUATIONS:  Glucose 112, BUN 22, creatinine 6.30, sodium 137, potassium 4.3, chloride 100, CO2 is 20, calcium 9.4. LFTs: Total protein 8.7, albumin 3.3, bili total 0.3, alk phos 295, Dilantin 5.1. WBC 6.1, hemoglobin 12.4, platelet count was 280.  CT scan of the abdomen and pelvis shows small subcutaneous soft tissue and hypodense collections within both groins suggestive of skin abscesses and large collateral vessels within the pubic region suggestive of a more distal venous obstruction.   ASSESSMENT AND PLAN: The patient is a 29 year old with end-stage renal disease, seizure disorder, diabetes, hypertension, recently hospitalized with multiple MRSA abscesses who presents with abdominal pain for the past few days, also chills, noted to have tachycardia, accelerated hypertension.   1.  Abdominal pain, unclear cause:   Will check a lipase and LFTs. CT of the abdomen any nonspecific findings. We will also place her on PPIs as well as start her on bowel regimen as well as simethicone.  2.  Accelerated hypertension: I will treat her with IV hydralazine p.r.n., continue labetalol, clonidine, Lasix.  The patient may need additional medications added such as Norvasc. 3.  Chills, tachycardia possibly due to systemic inflammatory response syndrome:  I will get blood cultures, IV vancomycin for now. Follow cultures.   4.  Recent MRSA abscess:  Wound looks okay. No evidence of infection involving these.  5.  Diabetes:  We will place her on sliding scale insulin, Lantus. 6.  Miscellaneous: We will do heparin for DVT prophylaxis.   TIME SPENT: Note 45 minutes spent on this H and P.    ____________________________ Lafonda Mosses. Posey Pronto, MD shp:cb D: 09/20/2012 21:25:46 ET T: 09/20/2012 22:35:48 ET JOB#: [id number]  cc: Loriann Bosserman H. Posey Pronto, MD, <Dictator> Alric Seton MD ELECTRONICALLY SIGNED 09/21/2012 16:32

## 2014-05-27 NOTE — Discharge Summary (Signed)
PATIENT NAME:  [name], [name] MR#:  [id number] DATE OF BIRTH:  20-Dec-1985  DATE OF ADMISSION:  10/13/2012 DATE OF DISCHARGE:  10/15/2012  DISCHARGE DIAGNOSES: 1.  Shortness of breath secondary to pulmonary edema, malignant hypertension with pulmonary edema, hypoxic respiratory failure secondary to pulmonary edema in the context of ASRD.  2.  History of  (seizure disorder 3.  Diabetes mellitus type 2.   4.  Hypertension.  5.  Anxiety.  6.  Positive opioid abuse.  7.  Depression.  h/o MRSA Abcess of buttocks ;  CONSULTATIONS: Consultation with nephrology, Dr. Mady Haagensen.   DISCHARGE MEDICATIONS:  1.  Renvela 800 mg 4 tablets 3 times daily with meals and 2 tablets with snacks.   2.  Neurontin 300 mg p.o. b.i.d. 3.  Lasix 40 mg p.o. daily.  4.  Labetalol 300 mg p.o. b.i.d.  5.  Keppra 5 mg p.o. daily.  6.  Trazodone 100 mg p.o. daily.  7.  Ativan 0.5 mg p.o. t.i.d. 8.  Fluoxetine 40 mg p.o. daily.  9.  Clonidine 0.3 mg p.o. t.i.d. 10.   Dilantin 100 mg p.o. t.i.d.  11.  Simethicone 80 mg every eight hours.  12.  Hydralazine 20 mg 4 times daily.  13.  Pantoprazole 40 mg p.o. daily.  14.  Zyvox 600 mg every 12 hours for MRSA, and she is given for 14 days.   HOSPITAL COURSE:   43. A 29 year old female patient admitted on September 9 for shortness of breath. The patient was recently discharged on September 3, came back on September 9 because of shortness of breath. The patient is legally blind and her last dialysis was Saturday. The patient was  brought by EMS to the ER. Chest x-ray showed pulmonary edema and was on 4 liters of oxygen and saturation went up to 94. The patient was admitted to telemetry and continued on oxygen.  A nephrology consult was placed;  however, (rapid response was called  because the patient was hypoxic and also had chest pain, which was midsternal with 10 out of 10 in severity.  The patient's EKG did not show any changes and all her troponins are negative, but  the patient was moved to Intensive Care Unit for nitro drip. She was on nitroglycerin for almost 12 hours, received  emergency hemodialysis was done at bedside because of pulmonary edema and also hypoxia requiring nonrebreather to keep saturations. The patient had dialysis, 4 liters of fluid was removed.  On September 10, the patient felt much better. Oxygen saturation improved, blood pressure improved. The patient was off the nitro drip and moved her to the floor, continued on her labetalol and also clonidine and other medications. remained hemodynamically stable,e. The patient had dialysis this morning, that is Thursday, and after dialysis the patient seems safe to go home. Discharge vitals:  Blood pressure is 158/94, heart rate 101, sats 98% on room air.  2.  MRSA abscess of the buttocks. The patient does not have a small boil, but it did not look like an abscess. The patient is not febrile, white count is normal.  The patient had MRSA before and cultures done multiple times showed MRSA only. The patient right now has only colonization. She was given Cipro.  She is to finish antibiotic course, the MRSA is resistant to Cipro, so I gave her Zyvox 600 mg p.o. b.i.d. for 14 days. The patient is to finish that.   3.  Pulmonary edema.  The patient received dialysis that  helped her symptoms.  Echocardiogram was ordered,  which showed ejection fraction of 60% to 65% with normal LV function.  4.  Diabetes mellitus, type II, which is controlled with diet seizure disorder. She is on Keppra on Dilantin.  6.  Anxiety. She is on trazodone.  7.  Chronic pain syndrome. Requested morphine IV multiple times and wanted to get IV morphine only. I did not give any prescription for morphine. The patient is to see pain management doctor if her pain is persistent. Mainly she complained of chronic back pain and generalized body pains. The patient has been seen by Dr. Mordecai MaesSanchez and also Dr. Carman Chingvipul Sha during previous admission as she  could not get pain meds from Dr.sanches she wanted different doctro so Dr.Shah  had to see her during previous admission,  previous records reviewed.   LAB DATA: Chest x-ray on September 9 showed pulmonary edema with  congestive heart failure. Blood cultures were negative. She had MRSA in the buttocks sensitive to clinda  and also linezolid. White count 6.1 yesterday.   The patient was sent home in stable condition. The patient was given taxi voucher to go home.   TIME SPENT: More than 30 minutes.   ____________________________ Katha HammingSnehalatha Nox Talent, MD sk:cc D: 10/15/2012 21:45:58 ET T: 10/15/2012 22:47:51 ET JOB#: [id number]  cc: Katha HammingSnehalatha Keamber Macfadden, MD, <Dictator> Katha HammingSNEHALATHA Tabria Steines MD ELECTRONICALLY SIGNED 11/02/2012 4:59

## 2014-05-27 NOTE — Consult Note (Signed)
Chief Complaint:  Subjective/Chief Complaint No acute events.  Patient's left face improved overnight after I&D.  Pain improved on left face.  Culture consistent with staph, sensitivities are pending.   VITAL SIGNS/ANCILLARY NOTES: **Vital Signs.:   07-Aug-14 11:38  Vital Signs Type Recheck  Pulse Pulse 90  Systolic BP Systolic BP 121  Diastolic BP (mmHg) Diastolic BP (mmHg) 71  Mean BP 87   Brief Assessment:  GEN well developed, well nourished, no acute distress   Respiratory normal resp effort   Additional Physical Exam left facial swelling and induration improved ~%50, drainage improved with serosanguinous drainage from site.  gentamicin reapplied and dressing changed.   Assessment/Plan:  Assessment/Plan:  Assessment Left facial abcess s/p I&D with staph on culture and history of MRSA   Plan Continue Vanc and await sensitivites.  Continue gentamicin ointment for 10 days and dressing changes QDay and PRN.  Will be happy to see as needed, but given interval improvement after drainage will sign off at this time.   Electronic Signatures: Torian Thoennes, Rayfield Citizenreighton Charles (MD)  (Signed 07-Aug-14 12:25)  Authored: Chief Complaint, VITAL SIGNS/ANCILLARY NOTES, Brief Assessment, Assessment/Plan   Last Updated: 07-Aug-14 12:25 by Flossie DibbleVaught, Andrw Mcguirt Charles (MD)

## 2014-05-27 NOTE — H&P (Signed)
PATIENT NAME:  [name], [name] MR#:  [id number] DATE OF BIRTH:  1985/07/27  DATE OF ADMISSION:  10/13/2012  PRIMARY CARE PHYSICIAN: Nonlocal.   REFERRING PHYSICIAN: John H. Margarita GrizzleWoodruff, MD  CHIEF COMPLAINT: Shortness of breath.   HISTORY OF PRESENT ILLNESS: The patient is [name] 29 year old African-American female who is legally blind, with [name] medical history of end-stage renal disease, on hemodialysis on Tuesday, Thursday and Saturday, seizure disorder, diabetes mellitus, hypertension and nephrotic syndrome, who is presenting to the ER with [name] chief complaint of shortness of breath. The patient is complaining of some cold and dry cough for the past few days, and she woke up with shortness of breath in the early hours today. She is compliant with her dialysis, and her last dialysis was done on Saturday. The patient was hypoxic in the ER. Actually, the patient called [facility], who brought her into the ER. Chest x-ray has revealed pulmonary edema. On 4 liters of oxygen, the patient's pulse oximetry went up to 94%. The patient is also complaining of knot in the left gluteal area, which has revealed an abscess which opened spontaneously, and purulent discharge is coming out. The patient has past history of MRSA. Denies any chest pain. No family members at bedside.   PAST MEDICAL HISTORY:  1. MRSA abscess.  2. Chronic anemia. 3. End-stage renal disease, on hemodialysis on Tuesday, Thursday and Saturday.  4. Seizure disorder.  5. Diabetes.  6. Hypertension.  7. Legally blind. 8. Nephrotic syndrome. 9. History of Charcot foot. 10. Major depression. 11. Anxiety.  12. Possible opioid abuse.   PAST SURGICAL HISTORY:  1. Multiple dialysis access procedures. 2. Failed left-sided AV fistula, but now has [name] graft in the left upper extremity.  3. History of MRSA abscess which required drainage.  4. History of cyst removal from the lungs.    ALLERGIES: ANCEF, MORPHINE, SULFA, TOMATO KETCHUP.   PSYCHOSOCIAL  HISTORY: Lives with [name] roommate. No history of smoking, alcohol or illicit drug usage.   FAMILY HISTORY: Diabetes runs in her family.   HOME MEDICATIONS:  1. Trazodone 100 mg once [name] day.  2. Renvela 4 tabs 3 times [name] day. 3. Polyethylene glycol 17 grams p.o. q.12 hours.  4. Levetiracetam 500 mg orally once [name] day. 5. Labetalol 300 mg 2 times [name] day.  6. Hydralazine 25 mg 4 times [name] day. 7. Gabapentin 300 mg 3 times [name] day.  8. Furosemide 40 mg once daily. 9. Fluoxetine 40 mg once daily. 10. Dilantin 100 mg 3 capsules orally 3 times [name] day.  11. Clonidine 0.3 mg 3 times [name] day. 12. Ativan 0.5 mg 3 times [name] day as needed for anxiety.   REVIEW OF SYSTEMS: CONSTITUTIONAL: Denies any fever or fatigue  EYES: The patient is legally blind. No sinus tenderness. No postnasal drip. ENT : No epstaxis,discharge LUNGS:Denies COPD, hemoptysis UY:QIHKVQCV:Denies any chest pain, palpitations QV:ZDGLOVGI:Denies nausea,vomiting and diarrhea FI:EPPIRJGU:Denies dysuria and hematuria ENDO:Denies polyuria, nocturia HEM/LYMPH:Denies bleeding and bruising Integu: no acne, rash NEURO:No vertigo and ataxia PSYCH:Denies any ADD,OCD  PHYSICAL EXAMINATION: GENERAL APPEARANCE: Moderately built and nourished. Not in acute distress NECK: Supple. No JVD. No thyromegaly.  LUNGS: Positive rales and rhonchi. Distant breath sounds.  CARDIAC: S1, S2 normal. Regular rate and rhythm. No murmurs.  GASTROINTESTINAL: Soft. Bowel signs are positive in all 4 quadrants. Nontender, nondistended. No hepatosplenomegaly.  NEUROLOGIC: Awake, alert and oriented x3. Follows verbal commands. Motor and sensory are intact. Reflexes are 2+.  EXTREMITIES: Trace edema is present. No cyanosis.  No clubbing.  SKIN: Warm to touch. Normal turgor. No rashes. No lesions. PSYCH:Normal mood and affect   LABORATORY AND IMAGING STUDIES: Chest x-ray has revealed pulmonary edema. BNP 8755. Glucose 105, BUN 42, creatinine 9.25, sodium 136, potassium 4.8, chloride 102, CO2 25, GFR  6, serum osmolality 283, calcium 8.2, total protein 8.7, albumin 3.4, total bilirubin 0.3, alkaline phosphatase 323, AST and ALT are normal. CK total is 235, CPK-MB 3.0, troponin less than 0.02. WBC 6.2, hemoglobin 9.7, hematocrit 28.7, platelets 249.   ASSESSMENT AND PLAN: [name] 29 year old African-American female presenting to the ER with [name] chief complaint of shortness of breath and knot in the left gluteal area. Will be admitted with the following assessment and plan.   1. Pulmonary edema in setting of end-stage renal disease. Nephrology consult is placed to Dr. Thedore [name] by ER physician. [name] nephrology consult is placed regarding pulmonary edema in view of end-stage renal disease. Monitor renal function closely.  2. Left gluteal abscess with history of methicillin-resistant Staphylococcus aureus. Will start the patient on vancomycin, pharmacy to dose. Wound culture and blood cultures were obtained.  3. The patient is legally blind.  4. End-stage renal disease. Continue her dialysis on Tuesday, Thursday and Saturday.  5. History of diabetes mellitus. Will be on sliding scale insulin.   CODE STATUS: She is full code.   The diagnosis and plan of care were discussed in detail with the patient and her family members at bedside.   TOTAL TIME SPENT ON ADMISSION: 50 minutes.   ____________________________ Samantha Lab, MD ag:OSi D: 10/13/2012 07:08:38 ET T: 10/13/2012 07:38:45 ET JOB#: [id number]  cc: Samantha Lab, MD, <Dictator> Samantha Lab MD ELECTRONICALLY SIGNED 10/24/2012 6:55

## 2014-05-27 NOTE — Consult Note (Signed)
   Comments   Asked to see pt for pain management. Met pt, introduced myself and asked about pain. Pt proceeded to speak in a very loud voice, used multiple expletives. Unable to have discussion with pt. I would be happy to work with pt if she is interested. Otherwise, will not follow up.    Electronic Signatures: Torianna Junio, Izora Gala (MD)  (Signed 25-Aug-14 10:33)  Authored: Palliative Care   Last Updated: 25-Aug-14 10:33 by Shaan Rhoads, Izora Gala (MD)

## 2014-05-27 NOTE — Discharge Summary (Signed)
PATIENT NAME:  [name], [name] MR#:  [id number] DATE OF BIRTH:  1985-08-05  DATE OF ADMISSION:  09/20/2012 DATE OF DISCHARGE:  09/28/2012  REASON FOR ADMISSION:  Abdominal pain, chills, tachycardia, elevated blood pressure.   DISCHARGE DIAGNOSES: 1.  Possible opioid abuse. 2.  Right lower quadrant abdominal pain.  3.  Ovarian cyst.  4.  Diverticulitis.  5.  Thickening of urinary bladder wall.    6.  Menorrhagia.  As [name] side note, the patient was not showing us her pads.  She was never seen to be actively bleeding.  She was continuing to say that she has lots of blood on her pads.  Again, we did not see the blood, or at least I did not see it during the time that I was taking care of her.   7.  Subjective fever.  Never documented fever during this hospitalization.  8.  Uncontrolled hypertension.  9.  End-stage renal disease with hemodialysis Tuesday, Thursday, Saturday.  10.  Diabetes, type 2.  11.  Anemia of chronic disease.  12.  Seizure disorder.   DISPOSITION:  Home.   MEDICATIONS AT DISCHARGE:  Renvela 800 mg 3 times daily 4 tablets, gabapentin 300 mg 2 times daily, furosemide 40 mg once daily, labetalol 300 mg twice daily, Keppra 500 mg once [name] day, trazodone 100 mg once [name] day, Ativan 0.5 mg 3 times daily, fluoxetine 40 mg once [name] day, clonidine 0.2 mg 3 times daily, Dilantin 100 mg take 3 capsules . Acetaminophen 325 mg as needed for pain, Norco 10/325 as needed for pain, MiraLAX 17 grams every 12 hours as needed for constipation, simethicone 80 mg chewable, hydralazine 50 mg 4 times daily, Cipro 250 mg once [name] day, take after hemodialysis on hemodialysis days.    FOLLOW-UP:   1.  The patient does not have [name] primary care physician.  We offered to find her one and she refused.   2.  Follow up with nephrology.  HOSPITAL COURSE:   [name] is [name] 29 year old female with history of nephrotic syndrome on dialysis, history of seizure disorder, legally blind.  She was recently hospitalized  from 09/09/2012 to 09/13/2012 and at that time the patient was admitted with multiple abscesses of her lower abdominal wall and MRSA.  They were drained and the patient was discharged.  This time the patient comes with what seems to be abdominal pain for [name] couple days.  She started having some chills.  Blood pressure started to increase; for that she was brought to the Emergency Department.  Her blood pressure was 205/118.  She was afebrile.  No abscesses.  Her abdomen was benign.  The patient was admitted with [name] diagnosis of abdominal pain and accelerated hypertension.  Also, chills, tachycardia, but the patient was afebrile.  Mostly [name] systemic inflammatory response syndrome.  Her diabetes and her MRSA abscesses were not giving any problems during this hospitalization.  So, the patient was admitted, treated for her pain, which I considered appropriate, we treated first with IV antibiotics and then after we were not able to find any specific acute cause of pain, she was transitioned to oral medications.  As soon as the patient has transitioned to oral medications, she started to get mad, cursing, yelling, pretty upset, wanting [name] second opinion, was consulted with Dr. Sherryll BurgerShah.  Dr. Sherryll BurgerShah reviewed all the case and he thought that the care was appropriate as far as the no use of hard narcotics for something that does not have  acute pathology.  Overall the evaluation of the abdominal pain consisted of multiple tests including 2 CT scans, 1 without and 1 with contrast.  The CT scan of the abdomen on August 17th showed [name] small subcutaneous foci within the right and left inguinal regions, likely phlegmonous.  Prominent inguinal lymph node, reactive.   Cardiomegaly.   Right ovarian cyst.  After this was discovered, the patient was counseled.  There was significantly large amount of stool on the CT scan, although the patient did not want to be treated for constipation.  After [name] while we were able to convince her.  The  patient was evaluated by OB/GYN.  OB/GYN determined that her pathology was not significant to create any abdominal pain.    The patient also was complaining of menometrorrhagia.  Her pelvic exam was deferred during the evaluation of GYN for what there was never any evidence of vaginal bleeding during this.  Her vaginal bleeding, the patient was hiding.  She would not let us look at her pads.  Dr. Patton Salles recommended that if she was bleeding significantly to do Depo Provera.  The patient continued to say that she was going to bleed more and that we needed to help her. When we recommended the options, Depo Provera for example, she said that we were trying to give her [name] blood clot.  The patient was left, refused this medication.    Her hemoglobin remained stable during the whole hospitalization, in between 11 and 10 and the patient was never again tachycardic or significantly hypotensive.   As far as her diabetes, was uncontrolled.  Her hypertension was uncontrolled.  The patient was not one to make much changes to her medications during this hospitalization.    The patient overall got really upset about not getting pain medications.  [name] second opinion was ordered by palliative care to see if they could help [place] with the pain, but the patient had [name] very upset way to refuse this.  She was screaming to the palliative care doctor, just with her introduction, for which she decided to abort the consultation.    Second, we asked [name] second opinion to Dr. Sherryll Burger, hospitalist with my group, who recommended also stay away from the narcotics.  The patient was found several times down, unresponsive, very difficult to arouse given one attempt.  The patient was thought that since there are not any findings on the CT scan that we were able to discharge her home.  We tried to get her [name] PCP.  We tried to get her [name] followup appointment with pain management, but she refused all of them.  The patient said that she was going to  leave AMA at some point, but we decided to discharge her prior to that.  The patient was offered transportation and many other things and she refused most of this.    We spent about 1 hour and 20 minutes taking care of this patient in between Dr. Sherryll Burger and me.    ____________________________ Felipa Furnace, MD rsg:ea D: 09/30/2012 23:51:00 ET T: 10/01/2012 06:36:16 ET JOB#: [id number]  cc: Felipa Furnace, MD, <Dictator> Coleta Grosshans Juanda Chance MD ELECTRONICALLY SIGNED 10/02/2012 12:57

## 2014-05-27 NOTE — H&P (Signed)
PATIENT NAME:  CHOUA, Samantha MR#:  [id number] DATE OF BIRTH:  Jun 13, 1985  DATE OF ADMISSION:  11/03/2012  PRESENTING COMPLAINT: Chest pain, not feeling well.   HISTORY OF PRESENT ILLNESS: [name] is a 29 year old African-American female with past medical history of end-stage renal disease, on hemodialysis, who has had frequent admissions recently. This is the third admission in the month of September with similar complaints of shortness of breath and chest pains. She does have history of narcotic abuse in the past. She came in to the Emergency Room after dialysis complaining of chest pain, not feeling well since yesterday. Denies any fever. She appeared a little tachycardic. Her blood pressure was 222/109 in the Emergency Room. She was also noted to have elevated BNP, and her chest x-ray showed mild pulmonary vascular congestion. Her sats were 96% on room air. She does not remember if ultrafiltration was done at hemodialysis. She is being admitted for possible mild congestive heart failure and accelerated hypertension.   PAST MEDICAL HISTORY: 1.  End-stage renal disease, on hemodialysis Tuesday, Thursday, Saturday. Her nephrologist is Dr. Thedore Mins and Dr. Cherylann Ratel.  2.  Congestive heart failure, chronic diastolic.  3.  Type 2 diabetes.  4.  History of seizure disorder.  5.  Chronic opioid abuse.  6.  Hypertension.  7.  Anxiety.  8.  Depression.  9.  History of MRSA abscess on the buttocks.   ALLERGIES:  ANCEF, MORPHINE, SULFA, TOMATO KETCHUP.    MEDICATIONS: 1.  Trazodone 100 mg at bedtime.  2.  Renvela 800, 4 tablets 3 times a day with meals and 2 tablets at bedtime.  3.  Polyethylene glycol every 12 hours.  4.  Protonix 40 mg daily.  5.  Keppra 500 mg daily. 6.  Labetalol 300 mg 1 tablet b.i.d.  7.  Hydralazine 25 mg 4 times a day.  8.  Gabapentin 300 mg 1 capsule 3 times a day.  9.  Lasix 40 mg daily.  10.  Dilantin 100 mg 3 tablets 3 times a day.  11.  Clonidine 0.3 mg 1  tablet 3 times a day.  12.  Ativan 0.5 mg 1 tablet 3 times a day.   PAST SURGICAL HISTORY:   1.  Multiple dialysis access procedures. 2.  Failed left-sided AV fistula, but now has graft in the left upper extremity.  3.  History of MRSA abscess, which required drainage on the left buttock.  4.  Cyst removal from the lungs.    SOCIAL HISTORY: She lives with a roommate. No history of smoking, alcohol, illicit drug use.   FAMILY HISTORY: Diabetes runs in the family.   REVIEW OF SYSTEMS:  CONSTITUTIONAL: No fever. Positive fatigue, weakness, and pain all over.  EYES: No blurred or double vision. No glaucoma or cataracts.  EARS, NOSE, THROAT: No tinnitus, ear pain, hearing loss, or difficulty swallowing.  RESPIRATORY: No cough, wheeze, or asthma.  CARDIOVASCULAR: Positive for chest pain and elevated blood pressure.  GASTROINTESTINAL: No nausea, vomiting, diarrhea, abdominal pain, or GERD.  GENITOURINARY: No dysuria, hematuria, or frequency.  ENDOCRINE: No polyuria, nocturia, or thyroid problems.  HEMATOLOGY: No anemia or easy bruising.  SKIN: No acne, rash, or any lesions.  MUSCULOSKELETAL: Positive for chronic pain. No joint swelling  NEUROLOGIC:  No numbness, weakness, dysarthria, or dementia.  PSYCHIATRIC: No anxiety or depression, or bipolar disorder.  All other systems reviewed are negative.   PHYSICAL EXAMINATION: GENERAL: The patient is awake, alert, oriented x 3. She is not in  acute distress.  VITAL SIGNS: Temperature 98.5, pulse is 112, respirations 20, blood pressure is improved, 222/109, sats are 96% on room air.  HEENT: Atraumatic, normocephalic. PERLA. EOM intact. Oral mucosa is moist.  NECK: Supple. No JVD. No carotid bruit.  RESPIRATORY: Decreased breath sounds at the bases. No audible wheezing or rales heard. No respiratory distress.   CARDIOVASCULAR: Tachycardia present. No murmur heard. PMI not lateralized. Chest nontender. Good pedal pulses, good femoral pulses. No  lower extremity edema.  ABDOMEN: Soft, benign, nontender. No organomegaly. Positive bowel sounds.  NEUROLOGIC: Grossly intact cranial nerves II through XII. No motor or sensory deficit.  PSYCHIATRIC:  The patient is awake, alert, oriented x 3.   DATA:  EKG shows sinus tachycardia, no acute ST elevation or depression. The patient had an echo done recently, September 10, which showed EF 60% to 65%, normal left ventricular systolic function, decreased LV internal cavity size, moderately increased left ventricular septal thickness. Moderately elevated pulmonary artery systolic pressure. Cardiac enzymes, first set negative. Comprehensive metabolic panel within normal limits, except albumin of 3.3 and bicarb of 34. H and H is 10.4 and 30.6.  B-type natriuretic peptide is 12,076. Troponin is 0.05. Lipase is 111.   ASSESSMENT:  [name] is a 29 year old with history of end-stage renal disease, on hemodialysis, who comes in with:   1. Chest pain/chest tightness, which appears atypical. The patient's cardiac enzymes are negative. She does not have any history of coronary artery disease. She appears to be somewhat tachycardic, which could be in the setting of accelerated blood pressure. Will admit patient on telemetry floor. Continue aspirin, cycle cardiac enzymes x 3, and p.r.n. Dilaudid for chest pain.   2.  Mild diastolic congestive heart failure, acute on chronic, with elevated BNP of 12,000. The patient did get hemodialysis today. She does not appear volume overloaded as of now. I will give a trial of 40 of Lasix for now, and then continue her p.o. Lasix, which is her home dose. This was discussed with Dr. Cherylann RatelLateef. The patient does not appear to be volume overloaded, and does not need any urgent dialysis.   3.  End-stage renal disease, on hemodialysis per Dr. Cherylann RatelLateef. The patient's dialysis days are Tuesday, Thursday, Saturday.   4.  Chronic opiate abuse. The patient does have a history of narcotic  use/abuse in the past. I will give her Dilaudid overnight today. If her labs remain stable as far as her cardiac enzymes, then consider discontinuing IV narcotics.   5.  Accelerated hypertension. Will continue all home medications, which is clonidine, p.o. hydralazine, and labetalol, and then will give p.r.n. IV hydralazine as needed.   6.  Seizure disorder. On Keppra and Dilantin, which will be continued.   7.  Deep venous thrombosis prophylaxis with heparin subcu t.i.d.   Further workup per the patient's clinical course. Hospital admission plan was discussed with the patient. The patient's admission plan was discussed with Dr. Cherylann RatelLateef, who is agreeable to it. No family members present.   TIME SPENT: 50 minutes.     ____________________________ Wylie HailSona A. Allena KatzPatel, MD sap:mr D: 11/03/2012 20:11:23 ET T: 11/03/2012 20:34:35 ET JOB#: [id number]  cc: Zakaiya Lares A. Allena KatzPatel, MD, <Dictator> Willow OraSONA A Rashun Grattan MD ELECTRONICALLY SIGNED 11/04/2012 18:03

## 2014-05-27 NOTE — Op Note (Signed)
PATIENT NAME:  [name], [name] MR#:  [id number] DATE OF BIRTH:  [birth year]  DATE OF PROCEDURE:  09/11/2012  PREOPERATIVE DIAGNOSIS: Multiple groin abscesses.   POSTOPERATIVE DIAGNOSIS: Multiple groin abscesses.   OPERATION: Incision and drainage.   SURGEON: Quentin Orealph L. Ely, M.D.   ASSISTANT: Redgie Grayerkelberry PA student; Elaina Patteeainbolt PA student  ANESTHESIA:  General.   OPERATIVE PROCEDURE: With the patient in the spine position after the induction of appropriate general anesthesia, the patient's abdomen was prepped with Betadine and draped with sterile towels. Each of the 3 abscesses in her groin were individually incised and drained.  Creamy white pus was expressed and was sent for culture. Iodoform packing was placed and sterile dressings were placed. The patient was returned to the recovery room having tolerated the procedure well. Sponge, instrument and needle counts were correct x 2 in the operating room.  ____________________________ Carmie Endalph L. Ely III, MD rle:sb D: 09/11/2012 14:32:53 ET T: 09/11/2012 15:44:11 ET JOB#: [id number]  cc: Quentin Orealph L. Ely III, MD, <Dictator> Quentin OreALPH L ELY MD ELECTRONICALLY SIGNED 09/11/2012 16:58

## 2014-05-27 NOTE — Consult Note (Signed)
Reason for consultation:  Abdominal pain, bladder thickening on CT scan 29 yo female with complex past medical history.  Urology consulted because the patient is admitted for abdominal pain with no etiology.  CT scan showed urine in bladder and "thickened bladder".  was sleeping when I tried to interview and examine her.  She was arousable but was not interactive. She denies blood in her urine.  She has had urinary tract infections in the past.   no urologic history 1.  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  History of chronic anemia.  Seizure disorder.  Diabetes.  Hypertension.  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  History of nephrotic syndrome.  End-stage renal disease on hemodialysis.  History of Charcot foot. Major depressive disorder.  Anxiety.  Depression.  SURGICAL HISTORY: Multiple but does not know all of them, but has had multiple dialysis access procedures, failed left-sided AV fistula, now has a graft, history of MRSA abscesses drained, history of cyst removal from the lungs.   afebrile, vital signs stableto examine patient because she was sleeping and did not want to be aroused  WBC 51+ leukocyte esterase, 3RBC, 22 WBC, 1+bacteriaCx 8/23 50,000 GNR  I personally reviewed the CT scan .  There are no concerning urologic issues PROCEDURE: CT  - CT ABDOMEN / PELVIS  W  - Sep 25 2012 12:13PM  CT of the abdomen and pelvis is performed with 100 mL of iodinated intravenous contrast and oral contrast with images at 3.0 mm slice thickness in the axial plane. Comparison is to the previous noncontrast exam on 09/20/2012. gastric fundus appears to be somewhat thickened but unchanged compare the previous exam. There is some soft tissue prominence in the tissues over the inguinal regions bilaterally which could inflammation or previous instrumentation. Correlate clinically.  inguinal lymph nodes are present. The urinary bladder is containing a  small amount of urine. The wall appears to be which could be artifactual. Cystic areas in the right adnexa could represent a right ovarian cyst. This appears unchanged. The is present of the right of midline a read there is no evidence of or small bowel obstruction or wall thickening. No radiopaque are evident. There is no hydronephrosis. No focal hepatic or mass is seen. The pancreas appears grossly normal. No ascites or fluid collection is evident. The abdominal aorta appears normal caliber. The contrast bolus is poor. There is no significant fat of retroperitoneum and retroperitoneal lymph nodes are difficult to No definite adenopathy is appreciated. The bony structures are  Persistent right ovarian cyst presumably present. Possible changes bilaterally over the inguinal regions. Air seen on the right is no longer evident. No definite adenopathy Urinary bladder wall thickening which can be consistent with Correlate with urinalysis. The study is otherwise unremarkable. lung bases appear clear except for dependent atelectasis.   No urologic etiology for severe abdominal pain, although patient has evidence of UTI.treat for UTI.  Bladder wall thickening may be artifactual but can also be c/w cystitis. you for this consult.  No further urologic evaluation necessary     Electronic Signatures: Janit BernMcNamara, Yaritza Leist R (MD)  (Signed on 24-Aug-14 17:29)  Authored  Last Updated: 24-Aug-14 17:29 by Janit BernMcNamara, Eyvette Cordon R (MD)

## 2014-05-27 NOTE — Discharge Summary (Signed)
PATIENT NAME:  [name], [name] MR#:  [id number] DATE OF BIRTH:  [birth year]  CONSULTANTS: Dr. Cherylann RatelLateef from nephrology.   CHIEF COMPLAINT: Lower extremity edema.   PRIMARY CARE PHYSICIAN: None.   She follows with Dr. Cherylann RatelLateef as an outpatient.   DISCHARGE DIAGNOSES:  1.  Lower extremity edema, likely somewhat worsening in the setting of dialysis dependent.  2.  End-stage renal disease, on dialysis.  3.  Hypertension.  4.  Diabetes.  5.  Seizure disorder.  6.  History of Methicillin-resistant Staphylococcus aureus abscesses.  7.  Depression.  8.  Anxiety.  9.  Narcotic-seeking behavior in the past.  10.  Legally blind.   DISCHARGE MEDICATIONS: Renvela 800 mg four tabs 3 times [name] day with meals and 2 tabs with snacks; gabapentin 300 mg three times [name] day; furosemide 40 mg once [name] day; labetalol 300 mg two times [name] day; Keppra 500 mg once [name] day; trazodone 100 mg once [name] day at bedtime; Ativan 0.5 mg one tablet 3 times [name] day as needed; fluoxetine 40 mg once [name] day; clonidine 0.3 mg three times [name] day; Dilantin 100 mg extended-release 3 caps three times [name] day; acetaminophen/hydrocodone 325/10 mg one tab every four hours as needed for pain; MiraLax 17 grams every 12 hours; simethicone 80 mg one tab every 8 hours; hydralazine 25 mg four times [name] day; Cipro 250 mg one tablet once [name] day after dialysis.   DIET: Low-sodium, low-fat, low-cholesterol renal diet.   ACTIVITY: As tolerated.   FOLLOWUP: Please follow with PCP and nephrologist within 1 to 2 weeks.   DISPOSITION: Home.   HISTORY OF PRESENT ILLNESS AND HOSPITAL COURSE: For full details of H and P, please see the dictation on September 1 for consult by Dr. Delfino LovettVipul Shah as well as admission H and P on September 2, but briefly, this is [name] unfortunate 29 year old female with multiple comorbidities including dialysis dependent, nephrotic syndrome, seizure disorder, legally blind, who came in for lower extremity edema.   She had some work-up done initially  in the ER. She was initially seen by internal medicine consult for the edema. X-ray of the chest had not shown any acute findings and she had lower extremity Dopplers on the left, showing no DVT. The case was discussed with nephrology as well, and this was thought to be secondary to mild volume overload, which should be taken care of during dialysis,; however, the patient refused to leave and wanted to be admitted and therefore was admitted to the hospitalist service for further evaluation and management.   She received dialysis and her edema improved. Her blood pressure remained stable. She was seen by nephrology, Dr. Cherylann RatelLateef, as well. She was maintained on her other medications. [name] PT consult was placed, but she refused to see the physical therapist. The patient does have an aide as an outpatient, and at this point, she will be discharged.   TIME SPENT: 40 minutes.    ____________________________ Krystal EatonShayiq Nyheim Seufert, MD sa:np D: 10/08/2012 16:06:40 ET T: 10/08/2012 16:32:01 ET JOB#: [id number]  cc: Krystal EatonShayiq Runa Whittingham, MD, <Dictator> Munsoor Lizabeth LeydenN. Lateef, MD  Krystal EatonSHAYIQ Venecia Mehl MD ELECTRONICALLY SIGNED 10/27/2012 13:52

## 2014-05-27 NOTE — H&P (Signed)
PATIENT NAME:  Samantha [name], Samantha [name] MR#:  [id number] DATE OF BIRTH:  [birth year]  DATE OF ADMISSION:  10/06/2012  REFERRING PHYSICIAN:  Dr. Fanny BienQuale   PRIMARY CARE PROVIDER:  None.  She is following with [facility].   CHIEF COMPLAINT: Lower extremity edema.   HISTORY OF PRESENT ILLNESS: This is [name] 29 year old female with known past medical history of end-stage renal disease on hemodialysis, seizure disorder, legal blindness. The patient is on dialysis Tuesday, Thursday, Saturday, she got her dialysis on Thursday without any complications. The patient presents today with complaints of lower extremity edema over the last two days. As well, she reports her swelling has been causing her some pain in her lower extremity, as well as making it more difficult for her to walk. The patient was seen initially by internal medicine consult, where she had her work-up done for her lower extremity edema. Her chest x-ray did not show any acute findings. She had venous Doppler mainly of the left lower extremity which did not show any evidence of deep vein thrombosis.  As well, case was discussed with nephrology, so this is just mild volume overload, which should be taken care of during her hemodialysis in the morning,  but the patient refused to be discharged home, so hospitalist service was requested to admit the patient for possible need for placement, versus possible need for evaluation by physical therapy and need more home care hours. The patient denies any fever, chills, nausea, vomiting, diarrhea, constipation. She complains of mild shortness of breath upon lying flat, but she has no hypoxia.  PAST MEDICAL HISTORY: 1.  MRSA abscesses.  2.  Chronic anemia.  3.  Seizure disorder.  4.  Diabetes.  5.  Hypertension.  6.  Legally blind.  7.  Nephrotic syndrome.  8.  End-stage renal disease on hemodialysis Tuesday, Thursday, Saturday.  9.  History of Charcot foot.   10.  Major depression.  11.  Anxiety.  12.   Possible opioid abuse  PAST SURGICAL HISTORY: 1.  History of multiple dialysis access procedures.  2.  Failed left side AV fistula now has [name] graft.  3.  History of MRSA abscesses which required drainage.  4.  History of cyst removal from the lungs.   ALLERGIES: ANCEF, MORPHINE, SULFA DRUGS, AND TOMATO KETCHUP.    SOCIAL HISTORY: No tobacco, alcohol or drug use.   FAMILY HISTORY: Significant for diabetes.   HOME MEDICATIONS: 1.  Tylenol 650 every four hours as needed.  2.  Tylenol/hydrocodone 325/10 mg oral every four hours as needed for pain.  3.  Clonidine 0.3 mg oral 3 times [name] day.  4.  Dilantin 100 mg oral extended release 3 capsules 3 times [name] day.  5.  Ativan 0.5 mg oral 3 times [name] day as needed.  6.  Gabapentin 300 mg oral 3 times [name] day.  7.  Lipitor.  8.  Keppra 500 mg oral daily.  9.  Fluoxetine 40 mg oral daily.  10.  Trazodone 100 mg oral at bedtime.  11.  Lasix 40 mg oral daily.  12.  Labetalol 300 mg oral 2 times [name] day.  13.  Polyethylene glycol as needed.  14.  Simethicone 80 mg oral every eight hours.  15.  renavela carbonate 800 mg oral 4 tablets 3 times [name] day with meals.  16.  Cipro 250 mg oral after hemodialysis.  17.  Hydralazine 25 mg oral 4 times [name] day.   REVIEW OF SYSTEMS:  CONSTITUTIONAL:  No  fever, no chills. Complains of fatigue and weakness.  EYES: The patient is legally blind.   EARS, NOSE, THROAT: No tinnitus, no ear pain, or nasal discharge.  RESPIRATORY: No cough, no wheezing. No hemoptysis.  CARDIOVASCULAR: No chest pain, mild shortness of breath upon lying supine, has mild lower extremity edema.  GASTROINTESTINAL: No nausea, vomiting, diarrhea.  GENITOURINARY: No dysuria or hematuria.  ENDOCRINE: No polyuria, no nocturia, no polydipsia.  HEMATOLOGIC: No anemia, no easy bruising.  SKIN: No rash. No erythema.  MUSCULOSKELETAL: No arthritis. No muscle cramps.  NEUROLOGIC: No tingling, numbness. No focal deficits.  PSYCHIATRIC: Has history of  anxiety and depression.   PHYSICAL EXAMINATION: VITAL SIGNS: Temperature 97.8, pulse 100, respiratory rate 18, blood pressure 144/82, saturating 100% on room air.  GENERAL: Well-nourished female who looks comfortable in bed, in no apparent distress.  HEENT: Head atraumatic, normocephalic, has short hair. The patient is legally blind with right eye appearing to be completely opaque, moist oral mucosa. No nasal discharge.  NECK:  Supple.  No thyromegaly. No JVD. Trachea is midline.  CHEST: The patient has good air entry bilaterally. No wheezing, rales, rhonchi.  CARDIOVASCULAR: S1, S2 heard. No rubs, murmurs or gallops.  ABDOMEN: Soft, nontender, nondistended. Bowel sounds present.  EXTREMITIES: Has mild pitting edema around the ankles, more on the left than the right. No cyanosis. No clubbing. Has good pedal pulses.  NEUROLOGIC: Cranial nerves grossly intact. No focal deficits could be appreciated. The patient does not appear to be putting forth effort in moving her lower extremities, but when the patient was turning in bed, appeared to be moving all extremities without any focal deficits.  MUSCULOSKELETAL: No joint erythema or swelling.  SKIN: No rash.  PSYCHIATRIC: Awake, alert x 3.   LABORATORY, DIAGNOSTIC AND RADIOLOGIC DATA: Glucose 138, BNP 4822, BUN 31, creatinine 8.06, sodium 136, potassium 4.1, chloride 100, CO2 28. White blood cell 5, hemoglobin 10.2, hematocrit 30.2, platelet 299.   IMAGING:  Chest x-ray cannot exclude mild pneumonitis or mild congestive heart failure.  Venous Doppler of left lower extremity: No evidence of lower extremity deep vein thrombosis.   ASSESSMENT AND PLAN: 1.  Lower extremity edema, this is chronic. It appears to be due to mild overload as she is [name] dialysis patient. The patient will get dialysis in the morning. Her venous Doppler was negative for deep vein thrombosis.  2.  Hypertension: Acceptable. Continue with home meds.  3.  End-stage renal disease. We  will consult nephrology to continue hemodialysis.  4.  Diabetes mellitus. We will have her on insulin sliding scale while in the hospital.  5.  Ambulatory dysfunction, we will consult physical therapy. We will consult case management as the patient might need increased hours at home versus placement.  6.  Deep vein thrombosis prophylaxis. Subcutaneous heparin.   Female [facility] ED Tech Crystal was present through entire interview and physical exam.  CODE STATUS: Full code.    TOTAL TIME SPENT ON ADMISSION AND PATIENT CARE: 50 minutes.    ____________________________ Starleen Arms, MD dse:cc D: 10/06/2012 01:51:37 ET T: 10/06/2012 03:13:25 ET JOB#: [id number]  cc: Starleen Arms, MD, <Dictator> DAWOOD Teena Irani MD ELECTRONICALLY SIGNED 10/06/2012 6:16

## 2014-05-27 NOTE — Discharge Summary (Signed)
PATIENT NAME:  [name], Tayra A MR#:  [id number] DATE OF BIRTH:  October 10, 1985  DATE OF ADMISSION:  09/09/2012 DATE OF DISCHARGE:  09/13/2012  PRIMARY CARE PHYSICIAN: None. Follows up with [facility] on [address]   FINAL DIAGNOSES:  1. Methicillin-resistant Staphylococcus aureus abscess, facial and suprapubic.  2. End-stage renal disease, [facility] hemod[facility] Tuesday[facility] Thursday, [facility] [address].  3. Diabetes.   4. Hypertension.  5. Anemia of chronic disease.  6. Seizure.   MEDICATIONS ON DISCHARGE: Include her usual medications. Renvela 800 mg 4 tablets 3 times a day with meals and 2 tablets with snacks, labetalol 300 mg 3 times a day, fluoxetine 40 mg daily, phenytoin 100 mg extended release 3 times a day, Keppra 250 mg 2 tablets daily, clonidine 0.3 mg 3 times a day, gabapentin 300 mg twice a day, Lasix 40 mg twice a day, Ativan 1 mg twice a day, Percocet 7.5/325 one tablet every 4 hours as needed for pain, Lantus decreased down to 12 units subcutaneous injection at bedtime, vancomycin 750 mg IV with dialysis for 5 more doses.   INSTRUCTION: Keep wounds covered.   DIET: Carbohydrate-controlled renal diet, regular consistency.   ACTIVITY: As tolerated.   FOLLOWUP: With Dr. Michela PitcherEly, surgical, this week, [facility] for dialysis Tuesday, Thursday and Saturday and 1 to 2 weeks with your medical doctor.   HOSPITAL COURSE: The patient was admitted with facial abscess and suprapubic abscesses. The patient was admitted and had ENT consult by Dr. Andee PolesVaught who did an I and D of the left facial abscess. Also surgical consultation done by Dr. Juliann PulseLundquist initially, followed up by Dr. Michela PitcherEly who took the patient to the operating room on 09/11/2012 for multiple groin abscesses which were drained and sent for culture.   LABORATORY AND RADIOLOGICAL DATA DURING THE HOSPITAL COURSE: Included blood cultures that were negative. White blood cell count 7.1, H and H 13.1 and 38.1, platelet count of 295.  Facial abscess grew out MRSA. Glucose 132, BUN 43, creatinine 9.92, sodium 135, potassium 4.6, chloride 98, CO2 30, calcium 9.0. TSH 2.55. LDL 51, HDL 58, triglycerides 195. Hemoglobin A1c 6.0. MRSA out of groin abscess. White count upon discharge 6.6.    HOSPITAL COURSE PER PROBLEM LIST:  1. For the patient's MRSA abscesses facial and suprapubic, they were drained by ENT and surgery, respectively. The patient was placed on vancomycin. The [facility] for followup on this. I advised her to keep the wounds covered until healed. The patient will receive a few more doses of vancomycin via dialysis. The patient must follow up with the next few dialysis sessions.  2. End-stage renal disease, on hemodialysis Tuesday, Thursday, Saturday at [facility]. Case discussed with nephrology who set up the outpatient vancomycin.  3. Diabetes: Sugars were on the lower side while here. I did cut back on her Lantus dose.  4. Hypertension: She was kept on her usual hypertensive medications.  5. Anemia of chronic disease: Follow up as outpatient.  6. History of seizure: She is on Keppra low dose and phenytoin. No seizures while here in the hospital.   The patient was stable for discharge home and discharged home in stable condition.   TIME SPENT ON DISCHARGE: 35 minutes.   ____________________________ Herschell Dimesichard J. Renae GlossWieting, MD rjw:gb D: 09/13/2012 13:26:30 ET T: 09/13/2012 21:39:36 ET JOB#: [id number]  cc: Herschell Dimesichard J. Renae GlossWieting, MD, <Dictator> Kyung Ruddreighton C. Vaught, MD Carmie Endalph L. Ely III, MD [facility]  Salley Scarlet MD ELECTRONICALLY SIGNED 09/18/2012 16:46

## 2014-05-27 NOTE — H&P (Signed)
PATIENT NAME:  [name], [name] MR#:  [id number] DATE OF BIRTH:  [birth year]  DATE OF ADMISSION:  09/09/2012  REFERRING PHYSICIAN:  Dr. Glenetta HewMcLaurin   [facility]  [facility]  PRIMARY CARE PHYSICIAN: None here. She moved to the area and is trying to get one.   CHIEF COMPLAINT:  Facial abscess and suprapubic abscess.   HISTORY OF PRESENT ILLNESS: The patient is [name] pleasant, 29 year old, unfortunate African- American female with [name] history of nephrotic syndrome, now on dialysis; diabetes, she has seizure disorder, legal blindness. She presents after being sent from the urgent care by Dr. Juanetta GoslingHawkins, per her. The patient stated that she has been having history of MRSA abscess the past, and she was doing well until about [name] week ago, when she started to develop abscesses again on the face and also in the suprapubic area, which are tender. The patient has no fevers, but has chills. Feels hot and cold. Has nausea. Feels ill, and has decreased p.o. intake. She went to the urgent care, was then referred here. Here, she has significant elevation of blood pressure, 196/106, but no fever. Her BMP panel has hemolyzed. Dr. Andee PolesVaught from ENT has come by, and has done an I and D of the facial abscess, and we are waiting for surgery for further I and D of abscesses in the suprapubic area. Hospitalist services were contacted for further evaluation and management.   PAST MEDICAL HISTORY:  Multiple, including history of MRSA abscesses in the past, anemia, seizure disorder, diabetes, hypertension, legal blindness after having bilateral blood vessel hemorrhages, on Coumadin in the setting of DVTs. Nephrotic syndrome. End-stage renal disease, on dialysis. Charcot foot. Major depressive disorder. Anxiety. Depression.   SURGICAL HISTORY: Multiple, but what she could remember was having several dialysis access procedures, failed left-sided AV fistula, now has [name] graft, history of MRSA,  I and D, and [name] cyst removed  from the lung.   SOCIAL HISTORY:  No tobacco, alcohol or drug use. Recently moved to the area. Has an aide.   FAMILY HISTORY:  Diabetes. Otherwise, she does not know.  REVIEW OF SYSTEMS:   CONSTITUTIONAL: No fever, but feels hot and cold. Weight is steady.  EYES:  Has legal blindness.  EARS, NOSE, THROAT:  No tinnitus or hearing loss.  RESPIRATORY:  No cough, shortness of breath, wheezing or COPD.   CARDIOVASCULAR: No chest pain, palpitations. Does have history of enlarged heart and arrhythmia, but she could not elaborate.  GASTROINTESTINAL: Positive nausea. No abdominal pain. No constipation or diarrhea.  GENITOURINARY:  Still urinates, but has difficulty initiating it.  ENDOCRINE: Denies polyuria, nocturia.  HEMATOLOGIC AND LYMPHATIC: History of chronic anemia. SKIN:  Positive for suprapubic abscesses, which are tender, and they were going to drain yesterday, but they it did not, per her. NEUROLOGIC:  No focal weakness or numbness.  MUSCULOSKELETAL: Has left-sided Charcot joint.  PSYCHIATRIC:  Has anxiety, depression.   PHYSICAL EXAMINATION: VITAL SIGNS:  Temperature on arrival 98.2, pulse rate 98, respiratory rate 18, blood pressure 196/106, O2 sat 100% on room air. GENERAL: The patient is [name] well-developed, African-American female, lying in bed in no obvious distress.  HEENT: Normocephalic, atraumatic. There is significant filming and discoloration of the right pupil and sclera. Moist mucous membranes.  NECK:  Supple. No thyroid tenderness or cervical lymphadenopathy.  CARDIOVASCULAR:  S1, S2. Regular rate and rhythm. No murmurs, rubs or gallops are appreciated.  LUNGS:  Clear to auscultation. No wheezing, rhonchi or rales.  ABDOMEN:  Soft. Hypoactive bowel sounds in all quadrants. No rebound or guarding.  EXTREMITIES: No significant lower extremity edema. The patient has some chronic lower extremity skin changes on the left shin, and the patient has Charcot joint of left foot,  without any tenderness or drainage. On examination of the skin, patient has [name] lanced left cheek abscess. Has [name] bandage with slight drainage. The patient has at least 4 suprapubic 1 to 2 cm abscesses, without any significant drainage tender and some mild surrounding erythema in the suprapubic area. NEUROLOGIC:  Face appears to be symmetrical. Tongue protrudes midline. Strength is 5/5 all extremities.  PSYCHIATRIC:  Awake, alert, oriented, pleasant.  LABS SO FAR:  BMP is lysed, but WBC 7.1, hemoglobin 13.1, platelets are 293, MCV is 102. There is no EKG.   ASSESSMENT AND PLAN: We have an unfortunate, 29 year old female with nephrotic syndrome, diabetes, seizure disorder, legal blindness after experiencing blood vessel hemorrhages in bilateral eyes on Coumadin,  deep vein thrombosis, history of arrhythmia, dialysis dependent, history of methicillin-resistant staphylococcus aureus, who presents with recurrent abscesses multiple locations, and I do suspect MRSA. I will start her on isolation, and start her on vancomycin and Cipro for some gram-negative coverage. The patient has been seen by Dr. Andee Poles from ENT, and the facial abscess has been lanced. Will also go ahead and obtain [name] surgical consult for lancing and drainage of the suprapubic area abscesses. In the meantime, would follow with blood cultures and obtain anaerobic cultures. The patient does not appear to have [name] fever. Would also obtain [name] Nephrology consult for resumption of dialysis, resume her blood pressure medications, and add hydralazine p.r.n. order. Given her history of arrhythmia, admit her to [name] remote monitored bed. I would start her on [name] carb diet, sodium restricted diet. Continue her anxiety and depression medications and seizure medications. Her last seizure was more than 6 months ago, per patient, and that has been stable. The patient is FULL CODE. I will start her on heparin for DVT prophylaxis.   Total time spent is 50 minutes.     ____________________________ Krystal Eaton, MD sa:mr D: 09/09/2012 18:21:52 ET T: 09/09/2012 19:10:59 ET JOB#: [id number]  cc: Krystal Eaton, MD, <Dictator> Krystal Eaton MD ELECTRONICALLY SIGNED 09/27/2012 12:27

## 2014-05-27 NOTE — Consult Note (Signed)
PATIENT NAME:  [name], [place] A MR#:  [id number] DATE OF BIRTH:  06-Dec-1985  DATE OF CONSULTATION:  11/19/2012  REFERRING PHYSICIAN:  ED physician, Sharyn CreamerMark Quale, MD CONSULTING PHYSICIAN:  Ardeen FillersUzma S. Garnetta BuddyFaheem, MD  REASON FOR CONSULTATION:  Depression.   HISTORY OF PRESENT ILLNESS:  The patient is a 29 year old African American female who presented to the ED reporting that she is under a lot of stress and having a nervous breakdown. She reported that she is on the [facility] and was trying to go to the hospital. However, her aide, who are currently lives with her, was causing her problems. She reported that aide is not helping her and then the aide decided that she needed to go to the ER yesterday. The patient reported that she went with her aide to the ER but then she was feeling very hungry, as she was sitting in the waiting room for many hours. She went to talk to the doctor, and then after a couple of hours, she decided that she cannot stayed there any longer. She asked her sister to bring her back home as she was feeling very tired and hungry and since she has a history of diabetes, she could not sit there hungry for several hours in the hospital. The patient came back home, but the aide's sister got mad at her as she stated that she could not have a left the aide by herself in the hospital. The patient reported that she felt that the aide is only concerned about herself and is not helping her at this time. The patient also mentioned that whenever they share money to buy food and groceries for themselves, the aide's children, who live close by, come to their house and they will eat a lot of their stuff. She reported that these stressors are causing her to feel depressed. When she presented initially to the hospital, she expressed some suicidal ideation to the ED physician, but during my interview, she reported that she does not have any thoughts to harm herself.  She reported that she is supposed to get the hemodialysis today as she gets every alternate day. She wants to [facility] for her hemodialysis. The patient reported that she feels that the aide does not have any respect for her. They moved together in [place] and has been living there for the past couple of months. She reported that the thoughts go through her mind and she has been going to the group therapy in the past, but it was not helpful. The patient reported that when she talks to aunt, it is very helpful. She currently denied having any suicidal or homicidal ideations or plans. She reported that she also receives depression [facility]. She reported that she is not physically abused or emotionally abused by the aide but the aide really helps her with mobility and she will take her to her appointments on a regular basis. The patient acknowledged that she should have taken something with her while she was going to the Emergency Room with the aide as the aide might be sick as well. She currently denied having any perceptual disturbances. She currently denied having any thoughts to harm herself and she reported that she can be discharged home and to get the dialysis today.  PAST PSYCHIATRIC HISTORY:  The patient reported that she was never admitted to a psychiatric hospital in the past. She reported that she has been diagnosed with  depression related to her medical conditions. She stated that she was diagnosed with diabetes when she was 29 years old. She is currently experiencing the complications of diabetes, and her nephrologist is Dr. Thedore Mins and Dr. Cherylann Ratel.   PAST MEDICAL HISTORY: 1.  End-stage renal disease and currently on hemodialysis Tuesday, Thursday and Saturday.  2.  Congestive heart failure, which is chronic and diastolic.  3.  Type 2 diabetes.  4.  A history of seizure disorder.  5.  Chronic opioid abuse.  6.  Hypertension.  7.  Depression, anxiety.  8.  A  history of MRSA abscess on the buttocks.   ALLERGIES:  ANCEF, MORPHINE, SULFA, and TOMATO KETCHUP.   CURRENT MEDICATIONS: 1.  Trazodone 100 mg at bedtime.  2.  Renvela 800 mg 4 tablets 3 times a day with meals and 2 tablets at bedtime. 3.  Polyoxyethylene glycol every 12 hours,  4.  Protonix 40 mg daily.  5.  Keppra 500 mg daily  6.  Labetalol 300 mg b.i.d. 7.  Hydralazine 25 mg 4 times a day. 8.  Gabapentin 300 mg 3 times daily. 9.  Lasix 40 mg daily.  10.  Dilantin 100 mg 3 times daily. 11.  Clonidine 0.3 mg 3 times a day.  12.  Ativan 0.5 mg 3 times a day.   PAST SURGICAL HISTORY:  Multiple dialysis access procedures, failed left-sided AV fistula but now has a graft in the lower left upper extremity, a history of MRSA abscesses, which requires drainage on the left buttock, cyst removal from the lungs.   SOCIAL HISTORY:  The patient currently lives with her aide. She denied using any drugs or alcohol.   FAMILY HISTORY:  The patient reported that her mother lives in [place] and her father lives in [place]. She has a strong history of diabetes in her family.   REVIEW OF SYSTEMS:  CONSTITUTIONAL:  Denies any fever, positive for fatigue, weakness and pain all over.  EYES:  No double or blurred vision. 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  EARS, NOSE AND THROAT:  No tinnitus, ear pain, hearing loss or difficulty swallowing.  RESPIRATORY:  No cough, wheezing or asthma.  CARDIOVASCULAR:  Denied having any chest pain.  GASTROINTESTINAL:  No nausea, vomiting, diarrhea.  GENITOURINARY:  No dysuria, has history of kidney problems and is currently on hemodialysis.  ENDOCRINE:  No polyuria or nocturia.  HEMATOLOGIC:  No anemia or easy bruising.  SKIN:  No acne or rash.  MUSCULOSKELETAL:  Positive for chronic pain.  NEUROLOGIC:  No numbness or weakness.   PHYSICAL EXAMINATION:  VITAL SIGNS: Temperature 98.6, pulse 109, respirations 18, blood pressure 192/111.  LABORATORY, DIAGNOSTIC, AND  RADIOLOGICAL DATA:  Glucose 127, BUN 49, creatinine 8.39, sodium 136, potassium 4.7, chloride 104, bicarbonate 21, GFR 7, osmolality 287. Blood alcohol level less than 3. Protein 9.2, albumin 3.6, bilirubin 0.3, alkaline phosphatase 380, AST 37, ALT 19. TSH 2.65. WBC 8.8, RBC 3.61, hemoglobin 12.4, hematocrit 37, platelet count 344, MCV 103.   MENTAL STATUS EXAMINATION:  The patient is a moderately built female who was sitting at the corner of the bed. Her speech was low in tone and volume. Mood was depressed. Affect was congruent. Thought process was logical, goal-directed. Thought content was nondelusional. She was somewhat upset about her aide, who did not help her much and was in the hospital herself. She denied having any suicidal or homicidal ideation or plan. She denied having any perceptual disturbances. Her memory appeared intact.  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  AXIS II: Personality disorder, not otherwise specified.  AXIS III: Please review the medical history.   TREATMENT PLAN:  The patient currently denied having any suicidal or homicidal ideation or plans and came in voluntarily to the hospital to seek help. She stated that she can go back to [facility] and to have her dialysis done today. She contracted for safety. She will be given one dose of her medications today before her discharge and then she will be discharged safely back to her Dialysis Center. I advised the patient to come back if she noticed worsening of her symptoms and she demonstrated understanding.   Thank you for allowing me to participate in the care of this patient.   ____________________________ Ardeen Fillers. Garnetta Buddy, MD  usf:jm D: 11/19/2012 14:28:02 ET T: 11/19/2012 16:41:03 ET JOB#: [id number]  cc: Ardeen Fillers. Garnetta Buddy, MD, <Dictator> Rhunette Croft MD ELECTRONICALLY SIGNED 11/24/2012 14:30

## 2014-05-27 NOTE — H&P (Signed)
PATIENT NAME:  [name], [name] MR#:  [id number] DATE OF BIRTH:  19-Apr-1985  DATE OF ADMISSION:  01/18/2013  PRIMARY CARE PHYSICIANS: Murlean Iba, MD / Anthonette Legato, MD (Nephrology)  CHIEF COMPLAINT: Nausea and vomiting for 2 days.  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 for the last 1 to 2 days. She reports to me yesterday afternoon was the last time she vomited. She was found to have blood pressure of 246/148 when she came to the Emergency Room and received several doses of IV labetalol. She received 1 dose of IV labetalol, p.o. labetalol, and of 2 mg of Dilaudid. Her blood pressure during my evaluation is stable. It is 153/70. She is being admitted for nausea, vomiting, and hypertensive urgency.   PAST MEDICAL HISTORY: 1.  End-stage renal disease on hemodialysis Tuesdays, Thursdays, and Saturdays.  2.  DVT. 3.  Legally blind in both eyes.  4.  Seizure disorder.  5.  Diabetes.   ALLERGIES: ANCEF, COUMADIN, MORPHINE, SULFA, TOMATO KETCHUP.   DISCHARGE MEDICATIONS: 1.  Trazodone 100 mg at bedtime.  2.  Renvela 800 mg 4 tablets 3 times a day with meals and 2 tablets with snacks.  3.  Protonix 40 mg p.o. daily.  4.  Keppra 500 mg daily.  5.  Labetalol 300 mg b.i.d.  6.  Hydralazine 25 mg 4 times a day.  7.  Gabapentin 300 mg 3 times a day.  8.  Lasix 40 mg p.o. daily.  9.  Fluoxetine 40 mg p.o. daily.  10.  Dilantin 100 mg extended release 3 capsules 3 times a day.  11.  Clonidine 0.3 mg 3 times a day.   SOCIAL HISTORY: She lives at home. She has an aide who comes to help her out during the daytime. No smoking. No alcohol  or illicit drug use.   PAST SURGICAL HISTORY: 1.  Multiple dialysis access procedures.  2.  Failed left-sided AV fistula but now has graft in the left upper extremity.  3.  Methicillin-resistant Staphylococcus aureus which required drainage on the left buttock.  4.  Cyst removed from the lungs.   FAMILY HISTORY: Diabetes runs in family.  REVIEW OF SYSTEMS:  CONSTITUTIONAL: No fever. Positive for fatigue, weakness, and pain all over. EYES: The patient is legally blind. No glaucoma or cataracts. ENT: No tinnitus, ear pain, or hearing loss.  RESPIRATORY: No cough, wheeze, or asthma.  CARDIOVASCULAR: Positive for hypertension. No chest pain or shortness of breath.  GASTROINTESTINAL: Positive for nausea. No vomiting, diarrhea, abdominal pain, or GERD.  GENITOURINARY: No dysuria, hematuria, or frequency.  ENDOCRINE: No polyuria, nocturia, or thyroid problems.  HEMATOLOGY: No anemia or easy bruising.  SKIN: No acne, rash, or any lesions.  MUSCULOSKELETAL: Positive for chronic pain. No joint swelling.  NEUROLOGIC: No CVA, TIA, dysarthria, or dementia.  PSYCHIATRIC: No anxiety, depression, or bipolar disorder. All other systems reviewed and negative.   PHYSICAL EXAMINATION: GENERAL: The patient is awake, alert, and oriented x3, not in acute distress at present.  VITAL SIGNS: Afebrile. Pulse is 96. Blood pressure is 175/82 and sats are 100% on room air.  HEENT: Atraumatic. The patient's right cornea is opacified. She has conjunctival  injection and some symptoms of photophobia as well. Oral mucosa is moist.  NECK: Supple. No JVD. No carotid bruit.  LUNGS: Clear to auscultation bilaterally. No rales, rhonchi, respiratory distress, or labored breathing.  HEART: Both the heart sounds are normal. Rate and rhythm is regular. PMI not lateralized. Chest is nontender.  EXTREMITIES: Feeble pedal pulses. Good femoral pulses. There is some lower extremity edema. The patient's left leg is more swollen than  the right leg.  NEUROLOGIC: Grossly intact cranial nerves II through XII. No motor or sensory deficit.  PSYCHIATRIC: The patient is awake, alert, and oriented x3.   LABORATORY AND DIAGNOSTICS: Cardiac enzymes first set negative.  CT of the abdomen is essentially negative, except new cardiomegaly, trace pericardial effusion, mild vascular congestion. Patchy bilateral lower space opacities could reflect  edema. Worsening varicosities within the anterior abdominal wall. In addition, dense small vessel calcification seen in the pelvis.   Ultrasound Doppler, of the left lower extremity: No acute DVT noted. Prominent lymph nodes in the groin that may be reactive.   Comprehensive metabolic panel within normal limits, except for BUN and creatinine of 26 and 6.24. Alk phos is 225. Albumin is 3.2. H and H is 10.9 and 33.8. White count is 5.5. Platelet count is 284.   Chest x-ray shows cardiomegaly and mild pulmonary vascular venous congestion.   EKG shows sinus tachycardia and left atrial enlargement.   ASSESSMENT AND PLAN: 29 year old [name] with history of end-stage renal disease and hypertension who comes in to the Emergency Room with:  1.  Nausea, vomiting, and abdominal pain. The patient likely has viral syndrome. Her last vomiting was yesterday afternoon. She continues to have some nausea. We will admit her to the medical floor and give her IV Zofran p.r.n. She was started on clear liquid diet, advance as tolerated and give antiemetics as needed. Her abdominal CAT scan is essentially negative. The patient does have issues with chronic pain and hence we will try to avoid as much narcotics as possible.  2.  Chronic opiate abuse. The patient does have narcotic abuse/use in the past. She already has received 2 doses of IV Dilaudid. She is currently not vomiting, her blood pressure is stable, and her CT of the abdomen does not show any acute pathology. Will hold off on giving any further narcotics.   3.  Hypertensive urgency. The patient came in with blood pressure of 246/148. She received IV labetalol. Blood pressure is down. Will resume all her home medications and put her on IV hydralazine as needed.  4.  Seizure disorder, on Keppra and Dilantin.  5.  Deep vein thrombosis prophylaxis with subcutaneous heparin t.i.d.  6.  Endstage renal disease. Will consult nephrology for inpatient dialysis.   Further work-up according to the patient's clinical course. Hospital admission plan was discussed with the patient. No family members were present. The patient is a FULL code.   TIME SPENT: 50 minutes.  ____________________________ Hart Rochester Posey Pronto, MD sap:sb D: 01/18/2013 08:29:18 ET T: 01/18/2013 09:19:48 ET JOB#: [id number]  cc: Keyondra Lagrand A. Posey Pronto, MD, <Dictator> Ilda Basset MD ELECTRONICALLY SIGNED 01/21/2013 10:59

## 2014-05-27 NOTE — Discharge Summary (Signed)
PATIENT NAME:  [name], [name] DATE OF BIRTH:  October 17, 1985  DATE OF ADMISSION:  11/03/2012 DATE OF DISCHARGE:  11/08/2012  PATIENT'S PRIMARY CARE PHYSICIAN: Dr. Thedore MinsSingh and Dr. Cherylann RatelLateef, nephrology.   FINAL DIAGNOSES: 1.  Chest pain, likely musculoskeletal in nature.  2.  End-stage renal disease, on hemodialysis Tuesday, Thursday, Saturday.  3.  Malignant hypertension.  4.  Diabetes.  5.  Legal blindness.   MEDICATIONS ON DISCHARGE: Include: Clonidine 0.3 mg 3 times a day, Dilantin 100 mg  3 capsules 3 times a day, gabapentin 300 mg 3 times a day, Keppra 500 mg once a day, fluoxetine 40 mg daily, trazodone 100 mg at bedtime, Ativan 0.5 mg 3 times a day as needed for anxiety and nervousness, labetalol 300 mg twice a day, Renvela 800 mg, 4 orally 3 times a day with meals, 2 tablets with snacks; hydralazine 25 mg 4 times a day, Protonix 40 mg daily, Lasix 40 mg daily.   DIET: Renal diet.   ACTIVITY: As tolerated.   FOLLOWUP: In 1 to 2 weeks.   HOSPITAL COURSE: The patient was admitted September 30th for chest pain and not feeling well.   HISTORY OF PRESENT ILLNESS: A 29 year old female with end-stage renal disease with frequent admissions. Blood pressure elevated in the Emergency Room. Pulse ox 96% on room air.   The patient was admitted with chest pain and chest tightness, atypical in nature. Cardiac enzymes were negative. The patient had a nephrology consult for her routine dialysis.   Laboratory and radiological data during the hospital course included cardiac enzymes that were negative x 3.   EKG showed a sinus tachycardia. EKG showed interstitial infiltrate, differential considerations: Mild pulmonary edema versus infectious or inflammatory etiology.   Lipase 111. BNP 12,076. White blood cell count 4.0, H and H 10.4 and 30.9, platelet count of 243. Glucose 85, BUN 11, creatinine 4.25, sodium 137, potassium 3.6, chloride 99, CO2  34, calcium 8.6.   Liver function  tests: Alkaline phosphatase slightly elevated at 288. Other liver function tests normal. Albumin low at 3.3.   Urinalysis: 1+ leukocyte esterase, greater than 500 mg/dL of protein, 1+ blood, parathyroid hormone intact 1008. Phosphorus 3.9.   HOSPITAL COURSE PER PROBLEM LIST:  1.  Chest pain: Usually a chest pain admission is a 1- to 2- day admission, but for this patient it was a 6-day admission. Her cardiac enzymes were negative x 3. Dr. Cherylann RatelLateef wanted to order a stress test for the patient, but the patient did not have a good enough IV for a stress test, so I called a cardiology consult with Dr. Juliann Paresallwood. Dr. Juliann Paresallwood did not recommend any further cardiac testing. Cardiac enzymes were negative. I did feel that the pain was reproducible in nature, so I did do a trial of steroids. The patient stated that the steroids made her feel worse and refused to take them. The patient does have a history of narcotic abuse and has been very demanding during the hospital course, stating that she would not leave without a stress test, stating that she would not leave with having pain. It took me personally 3 days to discharge her.  I was able to discharge her with the help of Dr. Cherylann RatelLateef. I was not going to prescribe any pain medication to this patient whatsoever. Dr. Cherylann RatelLateef can follow up with the patient as an outpatient for pain management. The patient was verbally belligerent at times, had refused her medications. I would be very hesitant  on admitting this patient to the hospital. All further workup can be done as outpatient. 2. End-stage renal disease, on Tuesday, Thursday and Saturday: Continue with outpatient hemodialysis.  3.  Malignant hypertension: Probably from not taking her medications and then refusing her medications while she was here. Blood pressure very variable during the hospital course. She will continue on her usual medications.  4.  Diabetes history: Sugar was 171 with steroids. 5.  Legal blindness.     The patient was discharged home in stable condition.   Time spent on discharge: 30 minutes.    ____________________________ Herschell Dimes. Renae Gloss, MD rjw:dm D: 11/09/2012 15:20:14 ET T: 11/09/2012 15:37:29 ET JOB#: [id number]  cc: Herschell Dimes. Renae Gloss, MD, <Dictator> Mosetta Pigeon, MD Munsoor Lizabeth Leyden, MD Salley Scarlet MD ELECTRONICALLY SIGNED 11/11/2012 14:08

## 2014-05-27 NOTE — Consult Note (Signed)
Brief Consult Note: Diagnosis: left facial abcess.   Patient was seen by consultant.   Consult note dictated.   Recommend further assessment or treatment.   Discussed with Attending MD.   Comments: 29 y.o. female with multiple medical comorbidities with multiple abcesses, one on left midface.  History of MRSA in past.  I&D of left facial abcess completed with ~5ccs of purulent drainage removed from site.  Sent for culture and sensitivity.  Impression:  Left facial abcess s/p I&D Plan:  Patient already getting vanc currently.  Will follow culture results and follow response of area to drainage.  Electronic Signatures: Flossie DibbleVaught, Talesha Ellithorpe Charles (MD)  (Signed 06-Aug-14 17:32)  Authored: Brief Consult Note   Last Updated: 06-Aug-14 17:32 by Flossie DibbleVaught, Jeannette Maddy Charles (MD)

## 2014-05-28 NOTE — Discharge Summary (Signed)
PATIENT NAME:  [name], [name] MR#:  [id number] DATE OF BIRTH:  1985/07/15  DATE OF ADMISSION:  08/03/2013 DATE OF DISCHARGE:  08/05/2013  SHE LEFT AGAINST MEDICAL ADVICE August 06, 2013.    DISCHARGE DIAGNOSES:  1.  Abdominal pain, undiagnosed.  2.  End-stage renal disease on dialysis.   DISCHARGE MEDICATIONS: Again left AGAINST MEDICAL ADVICE, so this was not done.   HISTORY AND PHYSICAL: Please see detailed history and physical done on admission.  HOSPITAL COURSE:  The patient was admitted with abdominal pain, which has been chronic, with multiple work-ups in the past. I had not been involved in her care until this time. GI was consulted. Upper gastrointestinal series was done. It was unremarkable. Gastroenterology consultants recommended against treating with opiod analgesics. She had minimally elevated C-reactive protein 6.2, which is likely from her dialysis. She was frustrated with the care and wanted further pain medications, though it was related to her multiple times by me and the GI consults that this was not [name] good idea from [name] diagnosed abdominal pain that was chronic in nature. Her abdominal ultrasound just showed her chronic renal disease in her kidneys.  KUB was unremarkable.   She should keep her dialysis appointments as noted.     ____________________________ Marya AmslerMarshall W. Dareen PianoAnderson, MD mwa:ts D: 08/19/2013 11:42:24 ET T: 08/19/2013 12:18:43 ET JOB#: [id number]  cc: Marya AmslerMarshall W. Dareen PianoAnderson, MD, <Dictator> Lauro RegulusMARSHALL W Lulani Bour MD ELECTRONICALLY SIGNED 08/20/2013 7:26

## 2014-05-28 NOTE — Consult Note (Signed)
Chief Complaint:  Subjective/Chief Complaint The patient continues to report abd pain. She reports that she has not had any stools for the last 2 days. Past work up has been negative.   VITAL SIGNS/ANCILLARY NOTES: **Vital Signs.:   02-Jul-15 15:48  Vital Signs Type Post Dialysis  Temperature Temperature (F) 97.9  Celsius 36.6  Temperature Source oral  Pulse Pulse 98  Respirations Respirations 18  Systolic BP Systolic BP 105  Diastolic BP (mmHg) Diastolic BP (mmHg) 67  Mean BP 79  Pulse Ox % Pulse Ox % 98  Pulse Ox Activity Level  At rest  Oxygen Delivery 2L   Brief Assessment:  GEN well developed   Cardiac Regular   Respiratory normal resp effort   Gastrointestinal Normal   Gastrointestinal details normal Soft   Additional Physical Exam Alert and orientated times 3   Lab Results: Routine Chem:  02-Jul-15 11:45   Potassium, Serum 4.9 (Result(s) reported on 05 Aug 2013 at 04:49PM.)  Phosphorus, Serum  7.5 (Result(s) reported on 05 Aug 2013 at 12:20PM.)   Assessment/Plan:  Assessment/Plan:  Assessment Patient abd pain and diarrhea. The diarrhea has resolved. The CRP has been elevated but no cause has been found for the increased.Upper GI with small bowel follow through was normal.   Plan Patient with a past GI w/u by Dr. Bluford Kaufmannh. No source for her pain found. Diarrhea resolved. No need for any endoscopic procedures at this time. Dr,. Mechele Collinlliott will be following for the weekend.   Electronic Signatures: Midge MiniumWohl, Oriyah Lamphear (MD)  (Signed 02-Jul-15 18:47)  Authored: Chief Complaint, VITAL SIGNS/ANCILLARY NOTES, Brief Assessment, Lab Results, Assessment/Plan   Last Updated: 02-Jul-15 18:47 by Midge MiniumWohl, Jalynn Betzold (MD)

## 2014-05-28 NOTE — Consult Note (Signed)
Brief Consult Note: Diagnosis: gastric bezoar.   Patient was seen by consultant.   Consult note dictated.   Recommend to proceed with surgery or procedure.   Comments: gastric bezoar not amenable to endoscopic removal. rec exploration and removal via gastrotomy. pt very sedated after EGD so I will return and discuss options.  Electronic Signatures: Lattie Hawooper, Guadalupe Kerekes E (MD)  (Signed 13-Feb-15 12:49)  Authored: Brief Consult Note   Last Updated: 13-Feb-15 12:49 by Lattie Hawooper, Carlita Whitcomb E (MD)

## 2014-05-28 NOTE — Consult Note (Signed)
Chief Complaint:  Subjective/Chief Complaint Pt denies nausea or vomiting.  c/o Right mid-abdominal pain.  She has not had a BM since admission.  Eating regular diet without complaints.   VITAL SIGNS/ANCILLARY NOTES: **Vital Signs.:   01-Jul-15 07:58  Temperature Temperature (F) 98.1  Celsius 36.7  Pulse Pulse 106  Respirations Respirations 16  Systolic BP Systolic BP 742  Diastolic BP (mmHg) Diastolic BP (mmHg) 80  Mean BP 97  Pulse Ox % Pulse Ox % 100  Pulse Ox Activity Level  At rest  Oxygen Delivery 2L  Pulse Ox After Adjustment (RN or RCP Only) % 96  Oxygen Delivery Adjusted To (RN or RCP Only)  Room Air/ 21 %   Brief Assessment:  GEN well developed, well nourished, no acute distress, A/Ox3.  Pt resting comfortably.  Lunch tray at bedside 95% comlpeted.   Cardiac Regular   Respiratory normal resp effort   Gastrointestinal Normal   Gastrointestinal details normal Soft  Bowel sounds normal  No rebound tenderness  No gaurding  No rigidity  +tenderness to entire abdomen on mild palpation   EXTR negative edema, +clubbing, trace pedal edema bilat   Additional Physical Exam Skin: warm, dry, intact   Lab Results: Routine Chem:  01-Jul-15 04:18   BUN  32  Creatinine (comp)  7.31  Sodium, Serum  135  Potassium, Serum 4.9  Chloride, Serum 98  CO2, Serum 32  Calcium (Total), Serum 8.6  Anion Gap  5  Osmolality (calc) 280  eGFR (African American)  8  eGFR (Non-African American)  7 (eGFR values <62m/min/1.73 m2 may be an indication of chronic kidney disease (CKD). Calculated eGFR is useful in patients with stable renal function. The eGFR calculation will not be reliable in acutely ill patients when serum creatinine is changing rapidly. It is not useful in  patients on dialysis. The eGFR calculation may not be applicable to patients at the low and high extremes of body sizes, pregnant women, and vegetarians.)  Magnesium, Serum 2.3 (1.8-2.4 THERAPEUTIC RANGE: 4-7  mg/dL TOXIC: > 10 mg/dL  -----------------------)  Routine Hem:  01-Jul-15 04:18   Erythrocyte Sed Rate  63 (Result(s) reported on 04 Aug 2013 at 08:42AM.)  WBC (CBC) 4.0  RBC (CBC)  3.49  Hemoglobin (CBC)  11.7  Hematocrit (CBC) 35.6  Platelet Count (CBC) 245  MCV  102  MCH 33.4  MCHC 32.9  RDW  18.7  Neutrophil % 50.2  Lymphocyte % 35.0  Monocyte % 9.4  Eosinophil % 4.7  Basophil % 0.7  Neutrophil # 2.0  Lymphocyte # 1.4  Monocyte # 0.4  Eosinophil # 0.2  Basophil # 0.0 (Result(s) reported on 04 Aug 2013 at 04:55AM.)   Assessment/Plan:  Assessment/Plan:  Assessment Acute on chronic abdominal pain/nausea:  Abdominal ultrasound benign from GI standpoint.  UGI with SBFT to look for IBD given elevated ESR, PUD or recurrent bezoar.  Gastroparesis, chronic functional abdominal pain & chronic constipation remain potential source. Change in bowel habits: No witnessesd diarrhea.  I think it is safe to cancel stool studies & treat for overflow incontinence if xray bengn.  Pt requesting narcotics, explained they would only complicate constipation & we have no indication at this time for narcotic pain medications.   Plan 1) Cancel stool studies unless diarrhea returns 2) follow up H pylori stool antigen 3) Agree with BID PPI 4) UGI with SBFT 5) Continue supportive measures Please call if you have any questions or concerns   Electronic Signatures: JVickey HugerL (  NP)  (Signed 01-Jul-15 14:24)  Authored: Chief Complaint, VITAL SIGNS/ANCILLARY NOTES, Brief Assessment, Lab Results, Assessment/Plan   Last Updated: 01-Jul-15 14:24 by Andria Meuse (NP)

## 2014-05-28 NOTE — H&P (Signed)
PATIENT NAME:  [name], [place] A MR#:  [id number] DATE OF BIRTH:  16-Aug-1985  DATE OF ADMISSION:  02/15/2013  PRIMARY CARE PHYSICIAN: Nonlocal.   REFERRING PHYSICIAN: Doralee AlbinoLinda M. Ladona Ridgelaylor, MD  CHIEF COMPLAINT: Epigastric abdominal pain associated with nausea, vomiting and diarrhea for 2 days.   HISTORY OF PRESENT ILLNESS: The patient is a 29 year old African-American female with a past medical history of end-stage renal disease, on hemodialysis, diabetes mellitus, hypertension and remote history of DVT, but not on Coumadin, who is presenting to the ER with a chief complaint of epigastric abdominal pain associated with nausea, vomiting and diarrhea for 2 days. She denies any fever or recent travel. She is reporting that she is feeling extremely weak and tired and could not keep anything down. The patient's BUN is at 65, creatinine is 9.30. Initial potassium is at 6.3. The patient is given D50, bicarbonate and subsequently Kayexalate, and subsequently, the patient's potassium went down to 5.9. The patient's blood pressure is very high initially at 193/111 with pulse 97, and the patient has received 2 doses of labetalol 20 mg IV. Subsequently, blood pressure dropped down to 154/98. Hospitalist team is called to admit the patient. During my examination, the patient is still feeling weak and tired. She reported that she is legally blind and cannot see whether she has blood in her stool or vomit. No family members are at bedside.   PAST MEDICAL HISTORY:  1. End-stage renal disease, on hemodialysis on Tuesday, Thursday and Saturday.  2. DVT, not on Coumadin.  3. Legally blind.  4. Seizure disorder.  5. Diabetes mellitus.  DICTATION ENDS HERE  ____________________________ Ramonita LabAruna Jerrick Farve, MD ag:lb D: 02/16/2013 07:50:00 ET T: 02/16/2013 09:19:48 ET JOB#: [id number]  cc: Ramonita LabAruna Amaris Delafuente, MD, <Dictator> Ramonita LabARUNA Saurav Crumble MD ELECTRONICALLY SIGNED 02/28/2013 7:09

## 2014-05-28 NOTE — Consult Note (Signed)
Pt not back from dialysis. Pt with large gastric bezoar. Will need EGD tomorrow to pump out food. Liquid diet rest of today but NPO after MN. thanks.  Electronic Signatures: Lutricia Feilh, Zakariyya Helfman (MD)  (Signed on 12-Feb-15 15:05)  Authored  Last Updated: 12-Feb-15 15:05 by Lutricia Feilh, Santhiago Collingsworth (MD)

## 2014-05-28 NOTE — Op Note (Signed)
PATIENT NAME:  [name], [name] DATE OF BIRTH:  20-May-1985  DATE OF PROCEDURE:  03/21/2013  PREOPERATIVE DIAGNOSIS: Gastric bezoar.   POSTOPERATIVE DIAGNOSIS: Gastric bezoar.  PROCEDURE: Exploratory laparotomy, gastrotomy and extraction of gastric bezoar.   SURGEON: Tylesha Gibeault E. Excell Seltzerooper, M.D.   ANESTHESIA: General with endotracheal tube.   INDICATIONS: This is a patient with a gastric bezoar causing pain and relative gastric outlet obstruction. Preoperatively, we discussed the rationale for surgery, the options of observation, the risk of bleeding, infection, recurrence, anastomotic leak, and fistula. This was reviewed for her in the preop holding area. She understood and agreed to proceed.   FINDINGS: Large gastric bezoar measuring approximately 12 cm x 4 cm. No other pathology noted.   DESCRIPTION OF PROCEDURE: The patient was induced to general anesthesia. VTE prophylaxis was in place. She was prepped and draped in a sterile fashion. A midline incision was utilized to open and explore the abdominal cavity. No other pathology was noted. A palpable mass was noted in the cardia and gastric fundus area. The stomach was placed on tension and pulled up into the wound gently. A gastrotomy was performed along the midportion of the stomach, and a Babcock instrument was placed into the stomach, and the visible gastric bezoar was extracted and found to be a large hairball. It was measured and placed on the back table.   Once assuring that there was no other pathology inside the stomach, the wound was closed after positioning of an NG tube utilizing an internal layer of #1 Vicryl, followed by an inverting running layer of 0 silk. The gastrotomy was adequately closed at this point. Therefore, once assuring that hemostasis was adequate and sponge, lap and needle counts were correct the wound was closed with running #1 PDS. Marcaine was infiltrated in skin and subcutaneous tissues for a total  of 30 mL, and then skin staples were placed and a sterile dressing was placed.   The patient tolerated the procedure well. There were no complications. She was taken to the recovery room in stable condition to be admitted for continued care.    ____________________________ Adah Salvageichard E. Excell Seltzerooper, MD rec:sg D: 03/21/2013 09:52:25 ET T: 03/21/2013 13:40:12 ET JOB#: [id number]  cc: Adah Salvageichard E. Excell Seltzerooper, MD, <Dictator> Lattie HawICHARD E Raven Furnas MD ELECTRONICALLY SIGNED 03/21/2013 14:17

## 2014-05-28 NOTE — H&P (Signed)
PATIENT NAME:  [name], [name] MR#:  [id number] DATE OF BIRTH:  11-11-1985  DATE OF ADMISSION:  03/18/2013  CHIEF COMPLAINT: Abdominal pain.   HISTORY OF PRESENT ILLNESS: The patient is a 29 year old female patient just discharged on February 9th, who comes in with abdominal pain. The patient has abdominal pain in the epigastric region. No radiation of pain. It is going on for about 2 days, and the patient also feels nauseous, but no fever, no vomiting, no diarrhea. The patient was here from February 6th to February 9th. At that time, she had chest pain, shortness of breath, uncontrolled hypertension. The patient found to have bezoar on CAT scan, so we are admitting for that, and Dr. Bluford Kaufmann is already consulted by ER doctor. He is going to do endoscopy tomorrow.   PAST MEDICAL HISTORY: Significant for:  1. Hypertension.  2. DVTs.  3. History of anemia of chronic kidney disease.  4. Diabetes.  5. ESRD. She is an ESRD patient, on dialysis Tuesday, Thursday and Saturday.  6. Seizure disorder.  7. Chronic pain syndrome.  8. Malignant hypertension.   HOME MEDICATIONS:  1. Dilantin 100 mg 3 capsules t.i.d.  2. Fluoxetine 40 mg p.o. daily.  3. Lasix 40 mg p.o. daily.  4. Neurontin 300 mg p.o. t.i.d.   5. Hydralazine 25 mg 1 tablet 4 times a day.  6. Labetalol 300 mg p.o. b.i.d.  7. Clonidine 0.3 mg p.o. t.i.d.  8.  Norvasc 10 mg p.o. daily.  9. Keppra 500 mg 1 capsule once a day.  10. Renvela 800 mg 4 tablets 3 times a day.  11. Pantoprazole 40 mg p.o. daily.  12. Trazodone 100 mg p.o. daily.   SOCIAL HISTORY: The patient has no history of smoking, no drinking, no drugs. Lives by herself, nurse aide helps her during the daytime.  FAMILY HISTORY: Significant for diabetes.  ALLERGIES: THE PATIENT IS ALLERGIC TO SULFA, ANCEF, COUMADIN, MORPHINE AND TOMATO KETCHUP.   PAST SURGICAL HISTORY: Has a dialysis catheter.  REVIEW OF SYSTEMS:  CONSTITUTIONAL: Feels abdominal pain, asking for pain  medications.  EYES: No blurred vision. The patient is legally blind. EARS, NOSE, THROAT: The patient has no ear pain, no epistaxis, no difficulty swallowing.  RESPIRATION: No shortness of breath. The patient does not have any cough.  CARDIOVASCULAR: The patient always has chronic chest pain.  GASTROINTESTINAL: Complains of epigastric abdominal pain and some nausea.  GENITOURINARY: The patient is an ESRD patient.  SKIN: No skin rashes.  MUSCULOSKELETAL: Complains of body aches.  NEUROLOGIC: The patient has no weakness or slurred speech.    PHYSICAL EXAMINATION:  VITAL SIGNS: Temperature 97.8, heart rate 88, blood pressure 132/91, saturation is 93% on room air.  GENERAL: The patient is alert, awake, oriented, a very young female not in distress, answering questions appropriately.  HEENT: Head normocephalic, atraumatic. Pupils equally reacting to light. The patient's mucous membranes are moist. No pharyngeal erythema. No turbinate hypertrophy.  NECK: Supple. No JVD. No carotid bruit.  CARDIOVASCULAR: S1, S2 regular. No murmurs.  CHEST: No chest wall tenderness.  LUNGS: The patient has bilateral rales present. No wheezing. Not using accessory muscles for respiration.  GASTROINTESTINAL: The patient's abdomen: She has mild epigastric tenderness present, otherwise soft. The patient's bowel sounds are intact. No organomegaly.  SKIN: No skin rashes.  MUSCULOSKELETAL: The patient has normal range of motion in all the 4 extremities. No cyanosis, no clubbing. No pedal edema.  NEUROLOGICAL: The patient is alert, awake, oriented x3. Cranial nerves  II through XII intact. Power 5/5 in upper and lower extremities. Sensation is intact. DTR 2+ bilaterally.   LABORATORY DATA: Electrolytes: Sodium 133, potassium 6.2, chloride 94, bicarbonate 29, BUN 96, creatinine 12.39, glucose 100. LFTs within normal limits. WBC 4.8, hemoglobin 10.3, hematocrit 30.2, platelets 233. Lipase 99. The patient's abdominal CAT scan  shows negative for bowel obstruction, constipation, nonobstructive gastric bezoar about 8 cm in length and bibasilar atelectasis.   ASSESSMENT AND PLAN:  1. The patient is a 29 year old female with abdominal pain, epigastric bezoar found on the CAT scan. The patient admitted to hospitalist service. GI consulted. The patient will be on clear liquids, and Dr. Bluford Kaufmannh probably can do endoscopy tomorrow. The patient can get Percocet for her pain, but we will avoid giving her IV Dilaudid. The patient wants regular food, and the patient will be on clear liquids only as per GI recommendations.  2. End-stage renal disease with hyperkalemia. Today, the patient already went for dialysis. Will repeat the potassium again after dialysis. The patient already got shifting measures in the Emergency Room with Kayexalate, insulin and dextrose.   3. Hypertension. The patient has uncontrolled hypertension from previous hospitalization. She is already on labetalol, clonidine, hydralazine and Norvasc. Continue those medications as the BP permits.  4. History of seizure disorder. Continue Keppra and Dilantin.  5. The patient does have chronic pain syndrome and narcotic-seeking behavior. Will give only Percocet. 6. The patient is admitted to Med/Surg with off-unit telemetry.   TIME SPENT ON THIS HISTORY AND PHYSICAL: 60 minutes.   CODE STATUS: Full code.    ____________________________ Katha HammingSnehalatha Himmat Enberg, MD sk:lb D: 03/18/2013 13:31:35 ET T: 03/18/2013 14:06:04 ET JOB#: [id number]  cc: Katha HammingSnehalatha Bayani Renteria, MD, <Dictator> Katha HammingSNEHALATHA Nereida Schepp MD ELECTRONICALLY SIGNED 04/01/2013 14:29

## 2014-05-28 NOTE — Consult Note (Signed)
PATIENT NAME:  [name], [name] MR#:  [id number] DATE OF BIRTH:  Oct 09, 1985  GASTROINTESTINAL CONSULTATION   DATE OF CONSULTATION:  08/03/2013  REFERRING PHYSICIAN:  Marya Amsler. Dareen Piano, MD CONSULTING PHYSICIAN:  Joselyn Arrow, NP  CONSULTING GASTROENTEROLOGIST: Midge Minium, MD  REASON FOR CONSULTATION: Abdominal pain, nausea, vomiting, diarrhea.   HISTORY OF PRESENT ILLNESS: [name] is a 29 year old female who was in her usual state of health, with history of chronic diastolic congestive heart failure, end-stage chronic kidney disease, on dialysis, seizure disorder, malignant hypertension, chronic abdominal pain, anemia of chronic disease, depression, secondary hyperparathyroidism, who presents with acute on chronic abdominal pain, nausea and diarrhea. She tells me that she has not had any vomiting. The symptoms started 3 days ago while she was in dialysis. She was found to have hyperkalemia in the ER with a potassium of 6 and was treated with Kayexalate, calcium gluconate, D50 and insulin. She is headed to dialysis this morning. She has not had any further diarrhea since last night. She did tell me she had 5 loose stools per day. She denies any rectal bleeding or melena. She has a history of chronic abdominal pain. She has actually had 9 CT scans since August 2014. The initial one was 09/20/2012. She did have right ovarian cysts and multiple non-GI findings, including pericardial effusions, cardiomegaly and opacities, pulmonary edema and infiltrates over review of the 9 CTs; however, other than constipation, there were no significant GI findings except for a gastric bezoar which was treated with exploratory laparoscopic gastrostomy and extraction of a hair bezoar by surgery on 03/19/2013. EGD the day before by Dr. Bluford Kaufmann showed chronic H. pylori gastritis. She is unsure whether she has been treated. 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 There is report in her chart of  drug-seeking behaviors, and she does ask for pain medicine before I leave the room. She has been started on b.i.d. Protonix 40 mg. KUB showed possible constipation, but no obstruction. Chest x-ray shows pulmonary edema, possible pneumonia and asymmetric opacities bilaterally. Her lipase was normal. Her alkaline phosphatase was 302, otherwise normal LFTs. CBC showed a hemoglobin of 12.2. MCV of 101. INR 1.1.   PAST MEDICAL AND SURGICAL HISTORY:  1. H. pylori gastritis.  2. Gastric hair bezoar removed with laparoscopic gastrostomy on 03/19/2013.  3. Depression, anxiety.  4. 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  5. She had an I and D of multiple groin abscesses with history of MRSA.  6. Secondary hyperparathyroidism.  7. Hypothyroidism.  8. Anemia of chronic disease.  9. Chronic abdominal pain.  10. Malignant hypertension.  11. DVTs. 12. Diabetes mellitus.  13. End-stage chronic kidney disease, on dialysis.  14. Seizure disorder.  15. Chronic diastolic congestive heart failure.   MEDICATIONS PRIOR TO ADMISSION:  1. Clonidine 0.2 mg b.i.d. 2. Gabapentin 300 mg t.i.d. 3. Hydralazine 50 mg q.i.d. 4. Keppra 500 mg b.i.d. 5. Labetalol 100 mg b.i.d. 6. Renvela carbonate 800 mg 4 tablets 3 times daily with meals and 2 tablets with snacks. 7. Sensipar 30 mg daily.   ALLERGIES: CIPRO CAUSES HIVES, COUMADIN CAUSES BLEEDING, MORPHINE CAUSES ITCHING, ANCEF WHEEZING, SULFA SWELLING AND RESPIRATORY DISTRESS AND KETCHUP CAUSED SWELLING AND HIVES.   FAMILY HISTORY: There is no known family history of colorectal carcinoma, liver or chronic GI problems.   SOCIAL HISTORY: No history of tobacco, alcohol or illicit drug use.   REVIEW OF SYSTEMS: See HPI. Otherwise, negative 12-point review of systems.   PHYSICAL EXAMINATION:  VITAL SIGNS: Temperature 97.2, pulse 106, respirations 17, blood pressure 155/90, O2 saturation 99% on room air.  GENERAL: She is a chronically ill-appearing  black female who is alert, oriented, pleasant, cooperative, in no acute distress.  HEENT: Eyes: Sclerae with injections bilaterally. The patient is blind. Oropharynx pink and moist without any lesions.  NECK: Supple, without any mass or thyromegaly.  CHEST: Heart regular rate and rhythm. Normal S1, S2, without murmurs, clicks, rubs or gallops.  LUNGS: Clear to auscultation bilaterally.  ABDOMEN: Positive bowel sounds x4. No bruits auscultated. Abdomen is soft, nondistended. She has generalized tenderness to her entire abdomen on palpation, but there is no rebound, tenderness or guarding. No hepatosplenomegaly or mass. Negative Murphy's sign. Negative McBurney's point tenderness.  EXTREMITIES: She does have clubbing. Trace pretibial edema bilaterally.  SKIN: Brown, warm and dry, without any rash or jaundice. Chronic venous changes, ulceration of bilateral lower extremities.  NEUROLOGIC: Grossly intact.  MUSCULOSKELETAL: Good equal movement and strength bilaterally.  PSYCHIATRIC: Alert, cooperative. Normal mood and affect.   LABORATORY STUDIES: BUN 56, creatinine 10.88, sodium 135, potassium 6, total protein 9.3, alkaline phosphatase 302, otherwise normal complete metabolic panel. See HPI.   IMPRESSION: [name] is a pleasant 29 year old black female admitted with hyperkalemia, acute on chronic abdominal pain, nausea and diarrhea. She has history of chronic abdominal pain, gastric bezoar, status post laparoscopic gastrostomy and extraction, and Helicobacter pylori gastritis (patient is unsure if she completed treatment earlier this year). KUB suggests possible constipation, but the patient states 5 loose, watery, nonbloody bowel movements daily for the past 3 days. She has had 9 CT scans of her abdomen and pelvis since August 2014 without significant gastrointestinal pathology other than bezoar, which has been resolved. Review of medical records states history of drug-seeking behaviors and chronic  functional abdominal pain. She is requesting pain medicines today during my visit. She has been on doxycycline for a recent tick bite as well. Differentials for diarrhea include acute gastroenteritis, foodborne illness, Clostridium difficile, overflow incontinence or other infectious colitis. Differentials for abdominal pain and nausea include Helicobacter pylori gastritis, hyperkalemia, chronic functional abdominal pain and gastroparesis.   PLAN:  1. C. difficile PCR, stool culture, Giardia and fecal white blood cells.  2. H. pylori stool antigen.  3. Agree with b.i.d. PPI.  4. Hyperkalemia management per attending.  5. Nursing staff to monitor diarrhea and vomiting and record.  6. Follow up abdominal ultrasound.   Thank you for allowing us to participate in the care of [name].    ____________________________ Joselyn ArrowKandice L. Jones, NP klj:lb D: 08/03/2013 09:40:03 ET T: 08/03/2013 09:55:12 ET JOB#: [id number]  cc: Joselyn ArrowKandice L. Jones, NP, <Dictator> Marya AmslerMarshall W. Dareen PianoAnderson, MD Joselyn ArrowKANDICE L JONES FNP ELECTRONICALLY SIGNED 08/06/2013 16:50

## 2014-05-28 NOTE — H&P (Signed)
PATIENT NAME:  [name], [name] MR#:  [id number] DATE OF BIRTH:  [birth year]  DATE OF ADMISSION:  05/18/2013  PRIMARY CARE PHYSICIAN: Supposed to be Dr. Einar Crow but has an appointment in May,  has not seen him yet.   PRIMARY NEPHROLOGIST: Dr. Cherylann Ratel   REFERRING PHYSICIAN: Dr. Scotty Court    CHIEF COMPLAINT: Shortness of breath.   HISTORY OF PRESENT ILLNESS: The patient is a 29 year old African American female with history of end stage renal disease on dialysis with multiple hospitalizations in the last three months here for various reasons. She was last hospitalized here in late March for shortness of breath and was discharged with suspected fluid overload due to missed dialysis. The patient  per chart also has narcotic seeking behavior. If note, she had another hospitalization and discharged on March 19 for acute on chronic diastolic congestive heart failure noncompliance and respiratory failure. She comes in today after experiencing shortness of breath, which started acutely overnight last night. She stated that she was in her usual state of self yesterday without any significant shortness of breath. She went to sleep, felt she could not lay flat. She slept for about an hour but woke up with progressively worsening shortness of breath and some cough. The cough is nonproductive. She has no sick contacts but has been in the hospital a couple of times in the last month. She has had no fevers or chills. She came into the hospital where she was found to have oxygen saturation of about 86% on room air and mildly elevated d-dimer and a CT angio of the chest was done to rule out PE, which did not show PE, but showed widespread congestive heart failure but pneumonia could not be excluded. She was given vancomycin, Zosyn and Levaquin. When I entered the room the blood pressure is 199/101. She is dyspneic and short of breath. I have ordered Hydralazine IV stat x 1 to bring down the blood pressure. The  nephrologist has been consulted for dialysis for today and hopefully she will be on the list for dialysis. Hospitalist services were contacted for further evaluation and management.   PAST MEDICAL HISTORY:  1. End-stage renal disease on dialysis. She states she has not missed dialysis recently.  2. History of multiple hospitalizations for shortness of breath, diastolic CHF and volume overload.  3. History of seizure disorder. Last seizure about two years ago.  4. Depression.  5. Gastric bezoar. 6. Hypertension.  7. Medical noncompliance.  8. Secondary hyperparathyroidism.  9. Legally blind.  10. History of multiple DVTs in the past years ago.  11. Bilateral retinal hemorrhages now legally blind.  12. History of drug seeking behavior per chart.  13. Anxiety.  14. History of MRSA abcesses  last year.   ALLERGIES: ANCEF, COUMADIN, MORPHINE, SULFA, TOMATO KETCHUP.   FAMILY HISTORY: Diabetes.   SOCIAL HISTORY: No smoking, alcohol or drug use. Has an aide, lives by herself.   OUTPATIENT MEDICATIONS: Clonidine 0.2 mg 2 times a day, Dilantin 100 mg three caps at bedtime extended-release, gabapentin 300 mg 3 times a day, hydralazine 50 mg 3 times a day, labetalol 100 mg 2 times a day, Renvela 800 mg 4 tabs 3 times a day with meals and 2 tabs with snacks. Sucralfate 1 gram 4 times a day before meals and at bedtime, trazodone 100 mg at bedtime as needed.   REVIEW OF SYSTEMS:  CONSTITUTIONAL: No fever or chills. Denies any weight gain or weight loss, is short of breath.  EYES: Is legally blind due to retinal hemorrhages in setting of Coumadin.  EARS, NOSE AND THROAT: No tinnitus or hearing loss.  RESPIRATORY: Positive for a dry cough, some wheezing and dyspnea on exertion.  CARDIOVASCULAR: Pleuritic chest pain with deep respirations, orthopnea, and dyspnea on exertion.  GASTROINTESTINAL: Denies nausea, vomiting, diarrhea or abdominal pain no black or tarry stools.  GENITOURINARY: Denies dysuria  or hematuria. She still does urinate.  ENDOCRINE: Denies polyuria, nocturia.  HEMATOLOGIC AND LYMPHATICS: Has chronic anemia.  MUSCULOSKELETAL: Denies arthritis or gout.  NEUROLOGIC: No focal weakness or numbness. Has history of seizures last seizure was a couple of years ago.  PSYCHIATRIC: Has anxiety and depression.   PHYSICAL EXAMINATION:  VITAL SIGNS:  Temperature on her arrival 97.7, last temperature 98.3. Last heart rate when I was in the room was low 100s. Respiratory rate while I was is in the room was 18, blood pressure while I was in the room was 199/101, oxygen saturation 92% on room air, but sats had gone as low as 86% on room air and now she is on oxygen.  GENERAL: The patient is a well-developed female sitting in bed in respiratory distress, tachypneic currently.  HEENT: Normocephalic, atraumatic. Pupils are cloudy and  no reflex bilaterally, injected conjunctiva. Moist mucous membranes.  NECK: Supple, positive for JVD. No significant lymphadenopathy.  CARDIOVASCULAR: S1, S2, some tachycardic, a little hyperdynamic. No significant murmurs appreciated.  LUNGS: Bilateral decreased breath sounds and some scattered crackles. No significant wheezing.  ABDOMEN: Soft, nontender, nondistended. Positive bowel sounds in all quadrants.  EXTREMITIES: 1+ pitting edema to mid shins. Strength appears to be five out of five in all extremities. Sensation is intact to light touch. Tongue protrudes midline. Face is symmetrical.  PSYCHIATRIC: Awake, alert, oriented x 3 little anxious.  SKIN: No obvious rashes. Chronic venous stasis changes and some chronic healed ulcerations lower extremities. No significant drainage or oozing.   LABORATORIES AND IMAGING: D-dimer 1711. White count of 5. Hemoglobin 8.4, platelets 327. Troponin negative. LFTs show alkaline phosphatase was 284, albumin of 3.2. Total protein 8.5, otherwise within normal limits. BUN 40, creatinine 7.89, sodium 136, potassium 4.4, glucose 82.  CT anterior chest shows widespread congestive heart failure and pneumonia, superimposed on the congestive heart failure cannot be entirely excluded, particular the left base, no demonstrable pulmonary embolus. There is no EKG in the chart.    X-ray of the chest, PA and lateral, done last night for cough, wheezing, which shows stable cardiomegaly. No pleural effusion. No pneumothorax. Bilateral interstitial and alveolar opacity, right greater than the left.   ASSESSMENT AND PLAN: We have a 29 year old with multiple admissions in the last several months with diastolic congestive heart failure, end-stage renal disease on dialysis, narcotic seeking behavior, legally blind who was last discharged late March who comes in for shortness of breath for about a day without significant fevers, chills. She has acute respiratory failure. I suspect her acute respiratory failure is due to acute on chronic diastolic congestive heart failure and pulmonary edema. She has no significant fever or leukocytosis to suggest she has a pneumonia. She has been given antibiotics in the ER, and given that she is a dialysis patient, should be good for now. Would bring down the blood pressure in the setting of this congestive heart failure by giving a dose of hydralazine and starting hydralazine IV p.r.n.. nitroglycerin patch and Dilaudid for improved respirations. She requires oxygen at this point and I would consider a BiPAP if she has  not taken to dialysis anytime in the near future.  She denied missed dialysis. She denied taking excessive fluids. We would re-evaluate her after dialysis to see if she would need antibiotics, but I suspect pulmonary edema and congestive heart failure in the setting of accelerated malignant hypertension would be more likely than pneumonia and causing the patient's symptoms.   In regards to the end-stage renal disease I would consult and I have discussed the case with  nephrology already, who would plan on  dialyzing later today. I would continue the Renvela.  In regards to seizure disorder. I would continue Dilantin, Keppra and check a Dilantin level. I would continue her anxiety, depression medications. I would continue her sucralfate. Start on heparin for deep vein thrombosis prophylaxis. The patient appears to have anemia, which appears to be chronic the setting of multiple medical conditions and chronic disease.   TOTAL CRITICAL CARE TIME SPENT: About 50 minutes.   The patient is FULL CODE.    ____________________________ Krystal Eaton, MD sa:sg D: 05/18/2013 10:21:00 ET T: 05/18/2013 10:46:37 ET JOB#: [id number]  cc: Krystal Eaton, MD, <Dictator> Munsoor Lizabeth Leyden, MD  Krystal Eaton MD ELECTRONICALLY SIGNED 06/03/2013 14:30

## 2014-05-28 NOTE — Discharge Summary (Signed)
PATIENT NAME:  Samantha [name], Samantha [name] MR#:  [id number] DATE OF BIRTH:  Jan 18, 1986  DATE OF ADMISSION:  04/20/2013  DATE OF DISCHARGE:  04/22/2013  DISCHARGE DIAGNOSES: 1.  Acute on chronic diastolic congestive heart failure.  2.  Noncompliance.  3.  Acute respiratory failure.  4.  Malignant hypertension.  5.  End-stage renal disease, on hemodialysis.   CONSULT: Dr. Cherylann RatelLateef with Nephrology.   IMAGING STUDIES: Include [name] chest x-ray, which showed pulmonary edema.   ADMITTING HISTORY AND PHYSICAL: Please see detailed H and P dictated by Dr. Allena KatzPatel. In brief, [name] 29 year old female patient with history of end-stage renal disease, multiple admissions and chronic pain syndrome. Admitted to the hospital complaining of some chest pain and shortness of breath. The patient was found to have pulmonary edema on chest x-ray, thought to be noncompliant with medications, as she also had malignant hypertension. Admitted to the hospitalist service.   HOSPITAL COURSE: 1.  Acute on chronic diastolic CHF with acute respiratory failure. The patient was initially on [name] BiPAP, had 2 rounds of dialysis, and has been transitioned to room air without any shortness of breath. Her chest pain had resolved. Cardiac enzymes normal. EKG showed nothing acute. The patient decompensated with her heart failure secondary to accelerated hypertension from noncompliance to medications. Prior to discharge, the patient was counseled to be compliant with the medications, take medications regularly. Her clonidine has been increased from 0.1 b.i.d. to 0.2 mg 2 times [name] day.   2.  For the patient's chronic pain, she had pain medications in the hospital. Prior to discharge, she does not complain of any pain, is doing well.   PHYSICAL EXAMINATION: Prior to discharge, the patient's lungs are clear, without any crackles. Good air entry. Heart sounds S1, S2, without any murmurs. No chest wall tenderness. Abdomen:  Soft. The patient had dialysis prior to  discharge.   DISCHARGE MEDICATIONS: 1.  Clonidine 0.2 mg oral twice [name] day. 2.  Gabapentin 300 mg oral 3 times [name] day. 3.  Fluoxetine 40 mg daily.  4.  Trazodone 100 mg oral once [name] day.  5.  Renvela 800 mg 4 tablets oral 3 times [name] day and 2 tablets with snacks.  6.  Keppra 500 mg oral 2 times [name] day.  7.  Labetalol 100 mg oral 2 times [name] day. 8.  Hydralazine 50 mg oral 4 times [name] day.  9.  Zofran 4 mg oral every 8 hours as needed for nausea or vomiting.  10. Dilantin 100 mg oral 3 caps use once [name] day at bedtime.  11.  Clonidine 0.2 mg oral 2 times [name] day.  12.  Senna 8.6- 30 mg oral once [name] day.   DISCHARGE INSTRUCTIONS: Renal diet of regular consistency. Activity as tolerated. Follow up with primary care physician and nephrology in 1 to 2 weeks.   Time spent on day of discharge and discharge activity was 50 minutes.     ____________________________ Molinda BailiffSrikar R. Khoa Opdahl, MD srs:mr D: 04/23/2013 14:36:51 ET T: 04/23/2013 22:22:15 ET JOB#: [id number]  cc: Wardell HeathSrikar R. Zackory Pudlo, MD, <Dictator> Orie FishermanSRIKAR R Lashika Erker MD ELECTRONICALLY SIGNED 05/01/2013 10:27

## 2014-05-28 NOTE — Consult Note (Signed)
Brief Consult Note: Diagnosis: Abd pain, Nausea, diarrhea.   Patient was seen by consultant.   Comments: [name] is a pleasant 29 y/o black female admitted with hyperkalemia, acute on chronic abdominal pain, nausea & diarrhea.  Hx chronic abdominal pain, gastric bezoar s/p lap gastrotomy & extraction, & h pylori gastritis (unsure if pt completed treatment earlier this year).  KUB shows possible constipation but patient says 5 large loose watery nonbloody BMS daily for past 3 days.  She has had 9 CT scans of her abdomen & pelvis since August 2014 without significant GI pathology other than bezoar which has been resolved.  Review of medical record states hx drug seeking behaviors & chronic functional abdominal pain.  She has been on doxycycline for recent tick bite as well.  DIfferentials for diarrhea include acute gastroenteritis, food borne illness, c diff, Overflow incontinence or other infectious colitis.  Differentials for abdominal pain & nausea  include h pylori gastritis, hyperkalemia, chronic functional abdominal pain & gastroparesis.    Plan: 1) c diff PCR, stool culture, WBC & giardia 2) H pylori stool antigen 3) Agree with BID PPI 4) Hyperkalemia management per attending 5) nursing staff to monitor diarrhea & vomiting 6) Follow up abdominal ultrasound Thanks for allowing us to participate in her care.  Please see full dictated note. #[id number].  Electronic Signatures: Joselyn ArrowJones, Kandice L (NP)  (Signed 30-Jun-15 09:40)  Authored: Brief Consult Note   Last Updated: 30-Jun-15 09:40 by Joselyn ArrowJones, Kandice L (NP)

## 2014-05-28 NOTE — H&P (Signed)
PATIENT NAME:  [name], [name] MR#:  [id number] DATE OF BIRTH:  [birth year]  DATE OF ADMISSION:  04/28/2013  PRIMARY CARE PHYSICIAN: Does not have one.  NEPHROLOGIST: Samantha SparkMunson Lateef, MD  CHIEF COMPLAINT: Shortness of breath.   HISTORY OF PRESENT ILLNESS: This is [name] 29 year old female who presents from home due to shortness of breath that began the past couple of days. The patient has [name] history of end-stage renal disease on hemodialysis, Tuesday/Thursday/Saturday. She missed her dialysis on Tuesday. She apparently says [name] and therefore she missed the dialysis. She was scheduled to have dialysis today but they could not fit her in. She tried taking an additional dose of Lasix as she felt short of breath although she does not make much urine and she did not feel much better.   She was sent over to the ER for further evaluation. She was noted to be hypoxic with O2 saturations in the mid-80's upon arrival. Hospitalist services are contacted for further treatment and evaluation due to volume overload due to missing dialysis.   The patient does not complain of any significant chest pain. She admits to shortness of breath on minimal exertion. She admits to nausea but no vomiting, no diaphoresis, no palpitations, no syncope. She does continue to complain of chronic abdominal pain which has been ongoing for quite [name] while.   REVIEW OF SYSTEMS:  CONSTITUTIONAL: Documented fever. No weight gain or weight loss.  EYES: No blurred or double vision.  ENT: No tinnitus. No postnasal drip. No redness of the oropharynx.  RESPIRATORY: Positive cough, nonproductive. No wheeze. No hemoptysis. Positive dyspnea.  CARDIOVASCULAR: No chest pain. No orthopnea. No palpitations. No syncope.  GASTROINTESTINAL: Positive nausea. No vomiting. No diarrhea. No abdominal pain. No melena or hematochezia.  GENITOURINARY: No dysuria or hematuria.  ENDOCRINE: No polyuria or nocturia. No heat or heat or  cold intolerance.  HEMATOLOGIC: No anemia, no bruising, no bleeding.  INTEGUMENTARY: No rashes or lesions.  MUSCULOSKELETAL: No arthritis. No swelling. No gout.  NEUROLOGIC: No numbness or tingling. No ataxia. No seizure activity.  PSYCHIATRIC: No anxiety. No insomnia. No ADD. Positive depression.   PAST MEDICAL HISTORY: Consistent with end-stage renal disease on hemodialysis, history of seizures, depression, history of gastric bezoar, hypertension, medical noncompliance, secondary hyperparathyroidism.   ALLERGIES: ANCEF, COUMADIN, MORPHINE AND SULFA.   SOCIAL HISTORY: No smoking. No alcohol abuse. No illicit drug abuse. Lives at home with her mother.   FAMILY HISTORY: Significant for diabetes.   CURRENT MEDICATIONS: As follows: Cinacalcet 30 mg daily, clonidine 0.2 mg b.i.d., Dilantin 300 mg at bedtime, fluoxetine 40 mg daily, gabapentin 300 mg t.i.d., hydralazine 150 mg 4 times daily, Keppra 500 mg b.i.d., labetalol 100 mg b.i.d., Zofran 4 mg q.8 hours as needed, Renvela 800 mg four tabs t.i.d. with meals and 2 tabs with snacks, trazodone 100 mg at bedtime as needed.   PHYSICAL EXAMINATION:  VITAL SIGNS: Temperature is 98, pulse 84, respirations 18 , blood pressure 181/98, and sats 92% on 3 liters nasal cannula.  GENERAL: [name] lethargic-appearing female but in no apparent distress.  HEENT: Atraumatic, normocephalic. Extraocular muscles are intact. Pupils equal and reactive light. Sclerae anicteric. No conjunctival injection. No pharyngeal erythema.  NECK: Supple. There is no jugular venous distention. No bruits, no lymphadenopathy, no thyromegaly.  HEART: Regular rate and rhythm. No murmurs, no rubs, no clicks.  LUNGS: Some bilateral coarse crackles midway up the lung fields. Negative use of accessory muscles. No dullness to  percussion.  ABDOMEN: Soft, flat. Tender diffusely. No rebound, no rigidity. Hypoactive bowel sounds. No hepatosplenomegaly appreciated.  EXTREMITIES: No evidence of any  cyanosis, clubbing, or peripheral edema. Has +2 pedal and radial pulses bilaterally. She has [name] left upper extremity AV fistula with good bruit, good thrill.  NEUROLOGIC: Alert, awake, and oriented x3 with no focal motor or sensory deficits appreciated bilaterally.  SKIN: Moist and warm with no rashes appreciated.  LYMPHATIC: No cervical or axillary adenopathy.   LABORATORY DATA: Serum glucose of 131, BUN 52, creatinine 9.6, sodium 134, potassium 4.5, chloride 99, bicarb 28. White cell count 5.4, hemoglobin 7.7, hematocrit 23.7, platelet count 271.   The patient did have [name] chest x-ray done which showed cardiomegaly with interstitial pulmonary edema.   The patient also had [name] CT scan of the abdomen and pelvis done without contrast showing no obstructive uropathy, bilateral small pleural effusions, moderate amount of stool throughout the colon. No bowel obstruction, anasarca.   ASSESSMENT AND PLAN: This is [name] 29 year old female with history of end-stage renal disease on hemodialysis, history of seizures, depression, history of gastric bezoar, hypertension, medical noncompliance, secondary hyperparathyroidism, who presents to the hospital due to shortness of breath and noted to be hypoxic as she missed her hemodialysis.  1.  Shortness of breath. Likely this is due to volume overload from patient missing her hemodialysis. [name] chest x-ray does show some mild pulmonary edema and b/l pleural effusions on her CT scan. The patient did attempt to take some additional Lasix at home, although it did not improve her symptoms as she does not make much urine. The patient has been seen by nephrology and will get hemodialysis today. No evidence of pneumonia on chest x-ray; therefore, hold off on any antibiotics. Follow her clinically for now.  2.  End-stage renal disease, on hemodialysis. The patient normally gets dialysis on Tuesday/Thursday/Saturday. I will consult nephrology. Continue care as per them.  3.  History of  seizures. Continue her Dilantin and Keppra. No evidence of acute seizures.  4.  Depression. Continue Prozac.  5.  Hypertension. The patient is presently hemodynamically stable. I will continue her labetalol, hydralazine, and clonidine.  6.  Chronic abdominal pain. The patient has high drug-seeking behavior. She does have [name] history of gastric bezoar. Her CT scan does not show any intra-abdominal pathology. For now, will  continuing p.r.n. Norco.   CODE STATUS: FULL CODE.   TIME SPENT: 50 minutes.   ____________________________ Rolly Pancake. Cherlynn Kaiser, MD vjs:np D: 04/28/2013 14:07:29 ET T: 04/28/2013 15:05:34 ET JOB#: [id number]  cc: Rolly Pancake. Cherlynn Kaiser, MD, <Dictator> Houston Siren MD ELECTRONICALLY SIGNED 04/30/2013 20:30

## 2014-05-28 NOTE — H&P (Signed)
PATIENT NAME:  Samantha Samantha, Samantha Samantha MR#:  [id number] DATE OF BIRTH:  13-Jun-1985  DATE OF ADMISSION:  03/12/2013  PRIMARY CARE PHYSICIAN:  Nonlocal.   REFERRING PHYSICIAN:  Dr. Daryel NovemberJonathan Williams.   CHIEF COMPLAINT:  Shortness of breath.   HISTORY OF PRESENT ILLNESS:  Samantha Samantha is Samantha Samantha with Samantha history of end-stage renal disease on hemodialysis Tuesday, Thursday and Saturday schedule, seizure disorder, diabetes mellitus who comes to the Emergency Department with complaints of shortness of breath.  Shortness of breath started since this morning.  The patient states last dialysis was on Thursday which was Samantha complete dialysis.  However, the patient stated to the Emergency Department that she missed the dialysis on Thursday.  In the Emergency Department, the patient was found to be hypoxic.  The patient received one dose of Lasix in the Emergency Department.  The patient complains of some chest pain.  The patient had multiple admissions for the similar complaints.  Work-up in the Emergency Department, the patient is found to have Samantha creatinine of 6.8, potassium of 3.9.  Denies having any cough.  Denies having any fever.  Chest x-ray is consistent with moderate interstitial pulmonary edema.  The patient has Samantha normal white blood cell count and has been afebrile.   PAST MEDICAL HISTORY: 1.  Hypertension.  2.  Previous history of DVT, not on any anticoagulation.  3.  End-stage renal disease on hemodialysis Tuesday, Thursday, Saturday schedule.  4.  Anemia of chronic kidney disease.  5.  Diabetes mellitus.  6.  Legally blind.  7.  Seizure disorder.  8.  I and D of the right gluteal region for MRSA infection.  10.  Left chest catheter for dialysis.   ALLERGIES:  1.  SULFA.  2.  ANCEF.  3.  COUMADIN.  4.  MORPHINE.  5.  TOMATO KETCHUP.   HOME MEDICATIONS: 1.  Trazodone 100 mg once Samantha day.  2.  Renvela 800 mg 4 tablets three times Samantha day.  3.  Protonix 40 mg once Samantha day.  4.  Keppra 500 mg  once Samantha day. 5.  Labetalol 300 mg 2 times Samantha day.  6.  Hydralazine 25 mg 4 times Samantha day.  7.  Gabapentin 300 mg 3 times Samantha day.  8.  Lasix 40 mg once Samantha day.  9.  Fluoxetine 40 mg once Samantha day.  10.  Dilantin 300 mg 3 times Samantha day.  11.   Clonidine 0.03 mg 3 times Samantha day.  12.  Amlodipine 10 mg once Samantha day.  13.  Percocet 5/325 mg every six hours as needed.   SOCIAL HISTORY:  No history of smoking, drinking alcohol or using illicit drugs.  Lives by herself, nursing aid helps her during the daytime.   FAMILY HISTORY:  Diabetes mellitus.   REVIEW OF SYSTEMS: CONSTITUTIONAL:  Denies any generalized weakness.  EYES:  No change in vision.  EARS, NOSE, THROAT:  No change in hearing.  No sore throat.  RESPIRATORY:  Has shortness of breath.  Has mild cough.  No productive sputum.  CARDIOVASCULAR:  Has some chest pain, shortness of breath. GASTROINTESTINAL:  No nausea, vomiting diarrhea.  GENITOURINARY:  The patient has end-stage renal disease, makes Samantha small amount of urine.  SKIN:  No rash or lesions.  MUSCULOSKELETAL:  Complains of body aches.  HEMATOLOGY:  No easy bruising or bleeding.  NEUROLOGIC:  No weakness or numbness in any part of the body.   PHYSICAL EXAMINATION: GENERAL:  This is Samantha well-built, well-nourished, age-appropriate [name] lying down in the bed, not in distress.  VITAL SIGNS:  Temperature 97.7, pulse 87, blood pressure 129/75, respiratory rate of 18, oxygen saturation is 99% on 2 liters of oxygen.  HEENT:  Head normocephalic, atraumatic.  Eyes, no scleral icterus.  Conjunctivae normal.  Pupils equal and react to light.  Extraocular movements are intact.  Mucous membranes moist.  No pharyngeal erythema.  NECK:  Supple.  No lymphadenopathy.  No JVD.  No carotid bruit.  CHEST:  No focal tenderness.  Bilateral occasional crackles are heard.  HEART:  S1, S2 regular.  No murmurs are heard.  ABDOMEN:  Bowel sounds plus.  Soft, nontender, nondistended.  No hepatosplenomegaly.   EXTREMITIES:  No pedal edema.  Pulses 2+.  SKIN:  No rash or lesions.  MUSCULOSKELETAL:  Good range of motion in all the extremities.  NEUROLOGIC:  The patient is alert, oriented to place, person and time.  Cranial nerves II through XII intact.  Motor 5 by 5 in upper and lower extremities.   LABORATORY DATA:  CMP, creatinine 6.8, the rest of all the values are within normal limits.   Chest x-ray, one view portable:  Pulmonary edema.   Coag profile is well within normal limits.   CBC:  WBC of 5.5, hemoglobin 12.1, platelet count of 235.   ASSESSMENT AND PLAN:  Samantha Samantha is Samantha Samantha who comes to the Emergency Department with fluid overload.  1.  Pulmonary edema.  Keep the patient on Lasix IV twice daily.  Consult nephrology in the morning.  The patient does not have any current indication for the dialysis.  The patient is having good oxygen saturations.  2.  Hypertension, currently well controlled.  Continue with the home medications.  3.  History of seizures.  Continue the home medications.  4.  Keep the patient on deep vein thrombosis prophylaxis with Lovenox.   TIME SPENT:  45 minutes.    ____________________________ Susa Griffins, MD pv:ea D: 03/13/2013 01:28:40 ET T: 03/13/2013 02:45:48 ET JOB#: [id number]  cc: Susa Griffins, MD, <Dictator> Susa Griffins MD ELECTRONICALLY SIGNED 04/08/2013 1:11

## 2014-05-28 NOTE — Consult Note (Signed)
General Aspect lack of iv access   Present Illness Called in to the hospital emergently for central access.  Apparently the patient has been without iv access for about a day after she pulled out and EJ peripheral iv .  She has become febrile and hypotensive and has been transferred to the ICU   PAST MEDICAL HISTORY: Significant for:  1. Hypertension.  2. DVTs.  3. History of anemia of chronic kidney disease.  4. Diabetes.  5. ESRD. She is an ESRD patient, on dialysis Tuesday, Thursday and Saturday.  6. Seizure disorder.  7. Chronic pain syndrome.  8. Malignant hypertension.   Home Medications: Medication Instructions Status  acetaminophen-oxyCODONE 325 mg-5 mg oral tablet 1 tab(s) orally every 4 to 6 hours, As Needed - for Pain Active  amLODIPine 10 mg oral tablet 1 tab(s) orally once a day Active  FLUoxetine 40 mg oral capsule 1 cap(s) orally once a day Active  cloNIDine 0.3 mg oral tablet 1 tab(s) orally 3 times a day Active  Dilantin 100 mg oral capsule, extended release 3 cap(s) orally 3 times a day Active  furosemide 40 mg oral tablet 1 tab(s) orally once a day Active  gabapentin 300 mg oral capsule 1 cap(s) orally 3 times a day Active  hydrALAZINE 25 mg oral tablet 1 tab(s) orally 4 times a day Active  labetalol 300 mg oral tablet 1 tab(s) orally 2 times a day Active  pantoprazole 40 mg oral delayed release tablet 1 tab(s) orally once a day Active  levETIRAcetam 500 mg oral tablet 1 cap(s) orally once a day Active  Renvela carbonate 800 mg oral tablet 4  orally 3 times a day (with meals) take 4 tabs three times a day with meals and 2 tabs with snac Active  traZODone 100 mg oral tablet 1 tab(s) orally once a day (at bedtime) Active    Sulfa drugs: Swelling, Resp. Distress  ancef: Wheezing  Coumadin: Bleeding  Morphine: Itching  Tomato Ketchup: Itching, Swelling, Hives  Case History:  Family History Non-Contributory   Social History negative tobacco, negative ETOH,  negative Illicit drugs   Review of Systems:  ROS Pt not able to provide ROS   Physical Exam:  GEN well developed, well nourished, critically ill appearing   HEENT hearing intact to voice, dry oral mucosa   NECK supple  No masses  trachea midline   RESP normal resp effort  no use of accessory muscles   CARD regular rate  LE edema present  no JVD   ABD denies tenderness  nondistended   EXTR negative cyanosis/clubbing, positive edema   SKIN No rashes, No ulcers   PSYCH poor insight, lethargic   Nursing/Ancillary Notes: **Vital Signs.:   22-Feb-15 09:56  Vital Signs Type Upon Transfer  Temperature Temperature (F) 102.1  Celsius 38.9  Pulse Pulse 110  Respirations Respirations 20  Systolic BP Systolic BP 094  Diastolic BP (mmHg) Diastolic BP (mmHg) 68  Mean BP 85  Pulse Ox % Pulse Ox % 96  Pulse Ox Activity Level  At rest  Oxygen Delivery Room Air/ 21 %   Hepatic:  16-Feb-15 04:45   Albumin, Serum  2.7  Bilirubin, Total 0.3  Alkaline Phosphatase  213 (45-117 NOTE: New Reference Range 12/25/12)  SGPT (ALT) 18  SGOT (AST) 25  Total Protein, Serum 7.3  Pathology:  16-Feb-15 12:00   Pathology Report ========== TEST NAME ==========  ========= RESULTS =========  = REFERENCE RANGE =  PATHOLOGY REPORT  Pathology  Report .                               [   Final Report         ]                   Material submitted:         Marland Kitchen BEZOAR .                               [   Final Report         ]                   Pre-operative diagnosis:                                        . BEZOAR, EXCISION BEZOAR GASTROTOMY .                               [   Final Report        ]                   ********************************************************************** Diagnosis: BEZOAR: - BEZOAR. RUB/03/23/2013 ********************************************************************** .  [   Final Report         ]                   Electronically signed:                                      Marland Kitchen Hulda Humphrey, MD, Pathologist .                               [   Final Report         ]                   Gross description:                                     . Received in a formalin filled container labeled Renne Cornick and bezoar is a 132 gram, 11.1 x 4.5 x 4.4 cm portion of darkly pigmented hair compacted into a solid crescent shaped form. There is an adherent pink-tan to pink-red semisolid material. Representative material is submitted in cassette 1. QAC/KCT .                               [   Final Report         ]                   Pathologist provided ICD-9: 935.2 . [   Final Report         ]                   CPT                                                        .  [facility]            No: [id number]           [address] [place], [place] [address]           Lindon Romp, MD         [phone]                                         [id number]   Result(s) reported on 24 Mar 2013 at 05:19AM.  Routine Chem:  16-Feb-15 04:45   Potassium, Serum 4.9  Glucose, Serum 77  BUN  37  Creatinine (comp)  9.13  Sodium, Serum  131  Chloride, Serum  94  CO2, Serum 29  Calcium (Total), Serum  8.3  Osmolality (calc) 270  eGFR (African American)  6  eGFR (Non-African American)  5 (eGFR values <65m/min/1.73 m2 may be an indication of chronic kidney disease (CKD). Calculated eGFR is useful in patients with stable renal function. The eGFR calculation will not be reliable in acutely ill patients when serum creatinine is changing rapidly. It is not useful in  patients on dialysis. The eGFR calculation may not be applicable to patients at the low and high extremes of body sizes, pregnant women, and vegetarians.)  Anion Gap 8  17-Feb-15 10:05   Phosphorus, Serum  9.2  Result Comment PHOSPHORUS - RESULT OBTAINED BY DILUTION  Result(s) reported on 23 Mar 2013 at 10:58AM.  Routine Hem:   16-Feb-15 04:45   Hemoglobin (CBC)  9.9  WBC (CBC) 4.7  RBC (CBC)  2.98  Hematocrit (CBC)  29.5  Platelet Count (CBC) 273  MCV 99  MCH 33.1  MCHC 33.4  RDW  17.2  Neutrophil % 64.2  Lymphocyte % 22.7  Monocyte % 9.0  Eosinophil % 3.4  Basophil % 0.7  Neutrophil # 3.0  Lymphocyte # 1.1  Monocyte # 0.4  Eosinophil # 0.2  Basophil # 0.0 (Result(s) reported on 22 Mar 2013 at 05:28AM.)  17-Feb-15 09:40   Hemoglobin (CBC)  9.9  WBC (CBC) 5.0  RBC (CBC)  3.05  Hematocrit (CBC)  30.1  Platelet Count (CBC) 330  MCV 99  MCH 32.6  MCHC 32.9  RDW  17.2  Neutrophil % 68.9  Lymphocyte % 17.3  Monocyte % 8.7  Eosinophil % 4.7  Basophil % 0.4  Neutrophil # 3.4  Lymphocyte #  0.9  Monocyte # 0.4  Eosinophil # 0.2  Basophil # 0.0 (Result(s) reported on 23 Mar 2013 at 11:19AM.)    Impression 1.  Patient with inappropriate IV access now without any access           will place a central line 2. Hypotension            [facility] 3.  End stage renal disease            Nephrology on consult   Plan level three consult   Electronic Signatures: SHortencia Pilar(MD)  (Signed 22-Feb-15 12:12)  Authored: General Aspect/Present Illness, Home Medications, Allergies, History and Physical Exam, Vital Signs, Labs, Impression/Plan   Last Updated: 22-Feb-15 12:12 by SHortencia Pilar(MD)

## 2014-05-28 NOTE — H&P (Signed)
PATIENT NAME:  [name], JUNIEL MR#:  [id number] DATE OF BIRTH:  1985-10-31  DATE OF ADMISSION:  02/15/2013  Addendum.  This is a continuation.  The patient's blood pressure is significantly elevated and after giving labetalol, blood pressure trended down to 154/98. No family members are present at bedside.   PAST MEDICAL HISTORY: End-stage renal disease, on hemodialysis on Tuesday, Thursday and Saturday, DVT not on any anticoagulant. Legally blind in both eyes, seizure disorder, and  diabetes mellitus.   PAST SURGICAL HISTORY: Left arm grafting for hemodialysis, history of multiple dialysis access procedures, MRSA with required drainage on the left buttock, cyst was removed from the lungs.   PSYCHOSOCIAL HISTORY: Lives at home, lives alone. Nursing aid comes to help her out during the daytime. No history of smoking, alcohol or other illicit drug usage.   FAMILY HISTORY: Diabetes runs in her family.   HOME MEDICATIONS: Valsartan 160 mg p.o. once daily, trazodone 100 mg p.o. once a day, Renvela 800 mg orally 3 times a day, pantoprazole 40 mg once daily, levetiracetam 500 mg once daily, labetalol 300 mg 1 tablet by mouth 2 times a day, hydralazine 25 mg 1 tablet p.o. 4 times a day, gabapentin 300 mg p.o. 3 times a day, furosemide 40 mg 1 tablet p.o. once daily, fluoxetine 40 mg p.o. once a day, Dilantin 100 mg 3 capsules p.o. 3 times a day, clonidine 0.3 mg 1 tablet p.o. 3 times a day, amlodipine 10 mg once daily.   REVIEW OF SYSTEMS: CONSTITUTIONAL: :But complaining of fatigue. Denies any fever.  EYES: Denies blurry vision or double vision. The patient is legally blind.  ENT: Denies epistaxis, discharge. RESPIRATION: Denies cough, COPD. CARDIOVASCULAR: No chest pain, palpitations.  GASTROINTESTINAL: Complaining of nausea, vomiting, diarrhea, epigastric abdominal pain. The patient is not quite sure whether she had any hematemesis or melena, as she is legally blind.  GENITOURINARY: No dysuria  or hematuria.  ENDOCRINE: Denies polyuria, nocturia. Has diabetes mellitus. HEMATOLOGIC AND LYMPHATIC: Chronic anemia from end-stage renal disease. Denies any easy bruising, bleeding.  INTEGUMENTARY: No acne, rash, lesions.  MUSCULOSKELETAL: No joint pain in the neck and back. Denies gout.  NEUROLOGIC:  No vertigo or ataxia.  PSYCHIATRIC: No ADD, OCD.   PHYSICAL EXAMINATION: VITAL SIGNS: Temperature 98.7, pulse 92, respirations 18, blood pressure is 164/100, pulse oximetry 96% on 2 liters.  GENERAL APPEARANCE: Not in acute distress. Moderately built and nourished.  HEENT: Normocephalic, atraumatic. Pupils are equally reacting to light and accommodation. The patient is legally blind. No scleral icterus. No sinus tenderness. No postnasal drip. Dry mucous membranes. NECK: Supple. No JVD. No thyromegaly. Range of motion is intact.  LUNGS: Clear to auscultation bilaterally. No accessory muscle usage. No anterior chest wall tenderness on palpation.  CARDIAC: S1, S2 normal. Regular rate and rhythm. GASTROINTESTINAL:  Soft. Bowel sounds are positive in all 4 quadrants. Positive epigastric tenderness. No rebound tenderness. No masses felt.  NEUROLOGIC: Awake, alert, oriented x 3. Motor and sensory are grossly intact. Reflexes are 2+.  EXTREMITIES: No edema. No cyanosis. No clubbing. Left upper extremity positive graft.  PSYCHIATRIC: Normal mood and affect.   LABORATORY AND IMAGING STUDIES: WBC 4.9, hemoglobin 10.4, hematocrit 31.3, platelets 228, glucose 76, BUN 65, creatinine 9.30, sodium 133, potassium 5.9, chloride 99, CO2 27, GFR 6, anion gap 7, serum osmolality 284, calcium 8.7, lipase initially at 246. Glucose 76, BUN 65, creatinine 9.30. Sodium 133, potassium 5.9  CAT scan abdomen and pelvis without contrast has revealed  pulmonary edema. A 12-lead EKG: Normal sinus rhythm at 168 beats per minute. Normal QRS, left atrial enlargement   ASSESSMENT AND PLAN:  A 29 year old [place] female  who is legally blind on hemodialysis for end-stage renal disease, is presenting to the ER with a chief complaint of nausea, vomiting, diarrhea epigastric abdominal pain for 2 to 3 days. 1.  Acute gastroenteritis with epigastric abdominal pain, probably viral etiology. The patient will be on antinausea medication. Stool tests for Clostridium difficile colitis and blood.  2.  Proton pump inhibitor as the patient has end-stage renal disease with pulmonary edema. Will hold off on the IV fluids.  3.  The patient will be on contact precautions.   4.  Malignant hypertension. We will resume her home medications, including clonidine, amlodipine, losartan. The patient will be receiving labetalol 20 mg IV q. 6 hours. as needed for elevated blood pressures. We will titrate the medications on as needed basis.  5.  Hyperkalemia. Status post Kayexalate potassium is trending down.  6.  End-stage renal disease, on hemodialysis. Nephrology consult is placed for continuation of hemodialysis.  7.  Diabetes mellitus. The patient will be on sliding scale insulin.  8.  The patient will be on gastrointestinal and deep vein thrombosis prophylaxis.  9.  She is FULL CODE.   Diagnosis and plan of care was discussed in detail with the patient. She is aware of the plan.   Total time spent on admission is 45 minutes.  ____________________________ Ramonita LabAruna Nyan Dufresne, MD ag:dp D: 02/16/2013 07:59:35 ET T: 02/16/2013 08:25:54 ET JOB#: [id number]  cc: Ramonita LabAruna Kiylee Thoreson, MD, <Dictator> Munsoor Lizabeth LeydenN. Lateef, MD Ramonita LabARUNA Willo Yoon MD ELECTRONICALLY SIGNED 02/25/2013 0:51

## 2014-05-28 NOTE — Consult Note (Signed)
EGD showed large gastric bezoar, mostly made of firm, matted hair. Unable to grab, break into pieces, or push through pylorus or pull through mouth. This bezoar will not go away by itself. Will need surgery to remove it. Surgery consult ordered. Pt may have chronic gastritis. Biopsies taken. Continue daily PPI. Will sign off. Thanks.   Electronic Signatures: Lutricia Feilh, Lajean Boese (MD) (Signed on 13-Feb-15 11:26)  Authored   Last Updated: 13-Feb-15 11:32 by Lutricia Feilh, Andrez Lieurance (MD)

## 2014-05-28 NOTE — H&P (Signed)
PATIENT NAME:  [name], [name] MR#:  [id number] DATE OF BIRTH:  1986-01-05  DATE OF ADMISSION:  04/20/2013  CHIEF COMPLAINT: Shortness of breath for 1 to 2 days with chest tightness.   HISTORY OF PRESENT ILLNESS: [name] is a 29 year old African American female well known to our service from previous many admissions, comes with history of  hypertension, end-stage renal disease on hemodialysis, comes to the Emergency Room with increasing shortness of breath and her sats were 92% on room air per EMS evaluation. She was placed on nonrebreather and applied 2 inch nitro paste. She was found to be in pulmonary edema bilaterally on chest x-ray. She is currently on BiPAP, feeling better with sats more than 95% on current BiPAP setting. She is being admitted for further evaluation and management. I spoke with Dr. Cherylann Ratel who plans to dialyze the patient and hopefully do some ultrafiltration today. The patient denies any chest pain at this time. Her mother is present in the Emergency Room.   ALLERGIES: ANCEF, COUMADIN, MORPHINE, SULFA, TOMATO CATSUP.   MEDICATIONS: 1.  Trazodone 100 mg p.o. at bedtime.  2.  Renvela  800 mg 4 tablets 3 times a day.  3.  Zofran 4 mg q.8 hourly.  4.  Labetalol 100 mg b.i.d.  5.  Keppra 500 mg b.i.d.  6.  Hydralazine 50 mg 4 times a day.  7.  Gabapentin 300 mg 3 times a day.  8.  Fluoxetine 40 mg 1 tablet daily. 9.  Dilantin 100 mg 3 capsules at bedtime.  10.  Clonidine 0.1 mg b.i.d.   PAST MEDICAL HISTORY: 1.  End-stage renal disease on hemodialysis.  2.  Status post gastric bezoar removal.  3.  Type 1 diabetes.  4.  History of diabetes.  5.  History of seizures.  6.  Hypertension.  7.  End-stage renal disease with nephrotic syndrome before.  8.  Bilateral blindness.  9.  Secondary hyperthyroidism. 10.  History of malignant hypertension.   SOCIAL HISTORY: She lives at home, has an aide who provides services at home. No history of smoking, drinking or any  drugs.   FAMILY HISTORY: Significant for diabetes.     REVIEW OF SYSTEMS:   CONSTITUTIONAL: Positive for fatigue, weakness. No fever.  EYES: No blurred or double vision, glaucoma or cataracts.  ENT: No tinnitus, ear pain, hearing loss or postnasal drip.  RESPIRATORY: Positive for shortness of breath. No hemoptysis, asthma or painful respiration.  CARDIOVASCULAR: Positive for chest tightness and orthopnea, along with dyspnea on exertion. No palpitations or syncope. Positive for high blood pressure.  GASTROINTESTINAL: No nausea, vomiting, diarrhea, abdominal pain. No GERD.  GENITOURINARY: No dysuria or hematuria, frequency.  ENDOCRINE: No polyuria, nocturia or thyroid problems.  HEMATOLOGY: No anemia or easy bruising or bleeding.  SKIN: No acne, rash or lesion.  MUSCULOSKELETAL: Positive for arthritis. No swelling or gout.  NEUROLOGIC: No CVA, TIA, vertigo or ataxia.  PSYCHIATRIC: No anxiety. Positive for chronic depression. All other systems reviewed and negative.   PHYSICAL EXAMINATION: GENERAL: The patient is awake, alert, oriented x 3. She is on BiPAP currently. Afebrile. Pulse is 99. Blood pressure is 207/117. Sats are 100% on BiPAP.  HEENT: Atraumatic, normocephalic. Pupils sluggishly reacting. The patient is blind bilateral, both eyes. Has very fuzzy vision. Oral mucosa is moist.  NECK: Supple. No JVD. No carotid bruit.  RESPIRATORY: No respiratory distress or labored breathing. The patient is currently on BiPAP. Can hear some bibasilar crackles in the lower bases. No  rhonchi heard.  CARDIOVASCULAR: Mild tachycardia. No murmur heard. PMI not lateralized. Chest nontender.  EXTREMITIES: Pedal pulses cannot be felt secondary to edema. The patient has 2+ pitting edema. Good femoral pulses.  PSYCHIATRIC:  The patient is awake, alert, oriented x 3.  NEUROLOGIC: Grossly intact cranial nerves II through XII. No motor or sensory deficit.   Glucose is 191. BUN is 55. Creatinine is 7.82.  Sodium is 133. Potassium is 4.6. SGOT is 81. Albumin is 3.0. H and H are 8.7 and 26.0. White count is 6. Chest x-ray shows a degree of congestive heart failure. No airspace consultation noted. PH is 7.40. PCO2 is 42, pO2 of 128. This is on BiPAP.  EKG shows sinus tachycardia.   ASSESSMENT AND PLAN:  A 29 year old [name] with history of end-stage renal disease, malignant hypertension and history of diabetes, not on any medications, comes in with:  1. Acute hypoxic respiratory failure secondary to pulmonary edema/congestive heart failure, acute diastolic. The patient currently is on BiPAP, tolerating well. Sats are stable. She does not seem to be in acute respiratory distress at this time. We will admit the patient on CCU stepdown. Nephrology consultation with Dr. Cherylann RatelLateef. The patient will get dialysis with some ultrafiltration to help with her fluid overload. We will cycle cardiac enzymes x 3. She does not have any history of coronary artery disease, although does have risk factors with diabetes and hypertension. We will monitor to monitor cardiac enzymes. If needed, consider cardiology consultation. Her echo was done in September 2014, which showed EF of 60% to 65%, moderate concentric LVH, dilated left atrium and decreased left ventricular cavity size. The patient is currently on nitro paste.  2.  Congestive heart failure, acute, appears diastolic. Echo in the past, September 2014, showed EF of 60% to 65%. We will continue dialysis with ultrafiltration and continue her with nitro paste at this time. Cycle cardiac enzymes x 3.  3. Malignant/axillary hypertension. Resume p.o. home meds. The patient is currently on labetalol, hydralazine and clonidine.  4.  Diabetes with uncontrolled sugars. During the patient's past admission, last admission where she was admitted with gastric bezoar. Her intake was not good. Her sugars were very well stable, and she has not placed on any p.o. medication. We will  monitor sugars for now. Continue sliding scale. If her sugars remain high, consider starting p.o. diabetic meds.  5.  End-stage renal disease on hemodialysis, which will be resumed.  6.  Chronic anemia. H and H are stable.  7.  Seizure disorder. The patient will be started back on her Keppra and her Dilantin. We will check serum Dilantin levels. There is some confusion about the patient's Dilantin dosage. We will clarify the dosage this time depending on her Dilantin levels.  8.  Deep vein thrombosis prophylaxis with subQ heparin.   Further workup according to the patient's clinical course. Hospital admission plan was discussed with the patient and the patient's mother who was present in the Emergency Room.   CRITICAL TIME SPENT: 55 minutes.    ____________________________ Wylie HailSona A. Allena KatzPatel, MD sap:dmm D: 04/20/2013 11:49:28 ET T: 04/20/2013 12:29:37 ET JOB#: [id number]  cc: Rubel Heckard A. Allena KatzPatel, MD, <Dictator> Willow OraSONA A Deniz Hannan MD ELECTRONICALLY SIGNED 04/23/2013 10:47

## 2014-05-28 NOTE — Discharge Summary (Signed)
PATIENT NAME:  Samantha MaineLAWSON, Shammara A MR#:  [id number] DATE OF BIRTH:  [birth year]  DATE OF ADMISSION:  03/18/2013 DATE OF DISCHARGE:  03/30/2013  Please review interim discharge summary dictated by Dr. Luberta MutterKonidena on February 18th.  CONSULTATIONS:  1. Dr. Thedore MinsSingh from nephrology.  2. Surgery, Dr. Juliann PulseLundquist.  3. Vascular, Dr. Gilda CreaseSchnier.   PROCEDURES:  1. Insertion of right femoral triple-lumen catheter with ultrasound guidance. This was done on 03/28/2013. 2. Exploratory laparotomy with removal of gastric bezoar by Dr. Excell Seltzerooper on February 15th.   HISTORY OF PRESENT ILLNESS: In addition to the interim discharge summary by Dr. Luberta MutterKonidena. The patient, [name], is a 29 year old African-American female with history of end-stage renal disease, on hemodialysis, who was admitted with abdominal pain, underwent gastric bezoar removal on February 15th with exploratory laparotomy.    BRIEF SUMMARY OF HOSPITAL COURSE FROM FEBRUARY 18 TO FEBRUARY 24: As follows:  1. Systemic inflammatory response syndrome with fever due to unclear etiology, resolved. The patient spiked high-grade temperature for 2 days, she became tachycardic, hypotensive, requiring some IV fluids. She was transferred to the Intensive Care Unit. Received empiric Zosyn and vancomycin. CT of the abdomen with p.o. contrast negative on February 22. Blood cultures from February 19 and February 22 were negative. The patient's antibiotics were changed to p.o. doxycycline for left cheek furuncle. She remained afebrile at discharge. Her vitals remained relatively stable.  2. Abdominal pain due to gastric bezoar, status post surgery with removal of 11 cm hairball on February 15th. The patient was able to take regular renal diet. She has history of chronic pain. Duragesic patch and p.o. Percocet were prescribed.  3. End-stage renal disease. Hemodialysis was continued Tuesday, Thursday, Saturday.  4. Legally blind.  5. Seizure disorder. The p.o. Keppra and  Dilantin were continued. The patient did have some elevated Dilantin levels due to high dose of Dilantin at home. Her Dilantin was held, and once her levels were therapeutic, she was started on Dilantin 100 mg t.i.d.  6. Relative low blood pressure. The patient has been asymptomatic. Her blood pressure meds were adjusted. She was discharged on amlodipine and hydralazine with holding parameters. The rest of her blood pressure meds were held.  7. The patient has personal care services at home. She has not returned calls from home health agencies in the past which has been a difficult situation for home health agencies. Hence, the patient will be discharged at this time with resuming her personal care services.   TIME SPENT: 40 minutes.   ____________________________ Wylie HailSona A. Allena KatzPatel, MD sap:lb D: 03/31/2013 06:36:30 ET T: 03/31/2013 07:12:28 ET JOB#: [id number]  cc: Lynnox Girten A. Allena KatzPatel, MD, <Dictator> Mosetta PigeonHarmeet Singh, MD Si Raiderhristopher A. Juliann PulseLundquist, MD Renford DillsGregory G. Schnier, MD Adah Salvageichard E. Excell Seltzerooper, MD Willow OraSONA A Herb Beltre MD ELECTRONICALLY SIGNED 04/04/2013 13:41

## 2014-05-28 NOTE — Op Note (Signed)
PATIENT NAME:  Samantha MaineLAWSON, Sharone A MR#:  [id number] DATE OF BIRTH:  May 24, 1985  DATE OF PROCEDURE:  03/28/2013  PREOPERATIVE DIAGNOSES: 1.  Lack of appropriate intravenous access.  2.  End-stage renal disease, requiring hemodialysis.  3.  Sepsis.   POSTOPERATIVE DIAGNOSES:  1.  Lack of appropriate intravenous access.  2.  End-stage renal disease, requiring hemodialysis.  3.  Sepsis.   PROCEDURE PERFORMED:  1.  Attempted insertion of triple-lumen catheter, right IJ.  2.  Insertion of right femoral triple-lumen catheter with ultrasound guidance.   PROCEDURE PERFORMED BY:  Levora DredgeGregory Layten Aiken, M.D.   INDICATION FOR PROCEDURE: The patient is in the Intensive Care Unit. She is  hypotensive and critically ill. She requires multiple parenteral medications to sustain her life, but does not have IV access. Risks and benefits for central line insertion were reviewed with the patient. The patient has agreed to proceed.   DESCRIPTION OF PROCEDURE: The patient is positioned supine. The right neck is prepped and draped in sterile fashion. Ultrasound is placed in a sterile sleeve. Ultrasound is utilized secondary to lack of appropriate landmarks and to avoid vascular injury. Under direct ultrasound visualization, the internal jugular vein is identified. It is echolucent and compressible, indicating patency. Image is recorded for the permanent record. Then 1% lidocaine is infiltrated in soft tissues. Image is recorded and access is obtained with a Seldinger needle without difficulty. J-wire is advanced; however, the J-wire will advance only to approximately 10 cm, and it does not advance any further. Both the J and the straight segment advance to this level, but not farther. Subsequently, a microtip is opened onto the field, and the microwire is attempted to advance, but this does not advance, either. Therefore, attempts at further placement from the jugular approach are abandoned, and the right groin is prepped and  draped in a sterile fashion. Ultrasound demonstrates the femoral vein is echolucent and compressible, indicating patency. Image is recorded. Under real-time visualization, after 1% lidocaine has been infiltrated in the soft tissues, access is obtained with a micropuncture needle, microwire followed by micro-sheath, J-wire followed by the dilator, and then the triple-lumen catheter. All 3 lumens aspirate and flush easily. The catheter is secured to the skin of the thigh with 2-0 silk. Sterile dressing is applied with a Biopatch. The patient tolerated the procedure well, and there are no immediate complications.     ____________________________ Renford DillsGregory G. Eiman Maret, MD ggs:mr D: 03/28/2013 12:01:15 ET T: 03/28/2013 18:56:52 ET JOB#: [id number]  cc: Renford DillsGregory G. Adrean Heitz, MD, <Dictator> Renford DillsGREGORY G Gissele Narducci MD ELECTRONICALLY SIGNED 03/30/2013 10:20

## 2014-05-28 NOTE — Consult Note (Signed)
PATIENT NAME:  [name], [name] MR#:  [id number] DATE OF BIRTH:  [birth year]  DATE OF CONSULTATION:  03/19/2013  REFERRING PHYSICIAN:   CONSULTING PHYSICIAN:  Ezzard StandingPaul Y. Creta Dorame, MD  REASON FOR REFERRAL: Gastric bezoar seen on CT scan.   HISTORY OF PRESENT ILLNESS: The patient is a 29 year old female, who comes in with increasing abdominal pain associated with nausea and vomiting. No fevers or chills. She was just hospitalized February 6 to the 9th. At that time, she had chest pain, shortness of breath and uncontrolled hypertension. In the Emergency Room, she had a CT scan that showed a large gastric bezoar that measured at least 8 cm in diameter. There was no evidence of obstruction. I was asked to see the patient to see what I can do to remove this bezoar endoscopically.   PAST MEDICAL HISTORY: Notable for hypertension. She also has a history of DVT. She has had long-standing history of diabetes. She has end-stage renal disease and is on dialysis every Tuesday, Thursday and Saturday. She also has seizure history and malignant hypertension.   HOME MEDICATIONS: Dilantin 3 times daily, Prozac 40 mg daily, Lasix daily, Neurontin 300 mg t.i.d., hydralazine 25 mg 4 times a day, labetalol 300 mg b.i.d., clonidine 0.3 mg t.i.d., Norvasc 10 mg daily, Keppra 500 mg daily, Renvela 800 mg 4 tablets 3 times a day, Protonix 40 mg daily and trazodone 100 mg daily.   SOCIAL HISTORY: There is no smoking or drinking.   FAMILY HISTORY: Notable for diabetes.  ALLERGIES: SULFA, COUMADIN, NSAID, MORPHINE AND Ketchup .   PAST SURGICAL HISTORY: Dialysis catheter.   REVIEW OF SYSTEMS: See initial review of symptoms dictated on the 12th. Please refer to this.   PHYSICAL EXAMINATION:  GENERAL: The patient is in no acute distress.  VITAL SIGNS: She is afebrile. Vital signs are stable.  HEAD AND NECK: Within normal limits.  CARDIAC: Regular rhythm and rate.  LUNGS: Clear bilaterally.  ABDOMEN: Normoactive bowel sounds.  Soft. There is some tenderness mainly in the epigastrium. She has got active bowel sounds.  EXTREMITIES: No clubbing, cyanosis, or edema.   LABS: Electrolytes showed sodium of 136, potassium 4.9, chloride is 99, CO2 29, BUN 53, creatinine 8.66 after dialysis yesterday. Liver enzymes showed elevated alkaline phosphatase at 238. White count is 4.8, hemoglobin 10.3. INR is normal. Parathyroid level is high at 282 pg/mL. Salicylate level is normal at 1.8. CT scan without contrast showed an 8 cm gastric bezoar in the proximal stomach. There are some distended fluid loops in the small bowel without any obstruction.   IMPRESSION: A patient with gastric bezoar on CT scan. With her diabetes and renal disease, she may have gastroparesis that is contributing to this. We will try to schedule an endoscopy later this morning to evaluate bezoar. We will try to either break it in pieces and remove it endoscopically if possible. If it is not successful, the patient was told that she will need surgery to remove if endoscopically bezoar cannot be removed. Thank you for the referral. ____________________________ Ezzard StandingPaul Y. Bluford Kaufmannh, MD pyo:aw D: 03/19/2013 11:31:31 ET T: 03/19/2013 12:32:15 ET JOB#: [id number]  cc: Ezzard StandingPaul Y. Bluford Kaufmannh, MD, <Dictator> Ezzard StandingPAUL Y Oceania Noori MD ELECTRONICALLY SIGNED 03/21/2013 8:38

## 2014-05-28 NOTE — H&P (Signed)
PATIENT NAME:  [name], [name] MR#:  [id number] DATE OF BIRTH:  December 17, 1985  DATE OF ADMISSION:  08/03/2013  REFERRING PHYSICIAN: Dr. Bayard Males  PRIMARY CARE PHYSICIAN: The patient started recently seeing Dr. Einar Crow, most recent visit in May of this year.   CHIEF COMPLAINT: Abdominal pain, vomiting, diarrhea.   HISTORY OF PRESENT ILLNESS: This is a 29 year old female with known history of end-stage renal disease on hemodialysis Tuesday, Thursday, Saturday, with multiple hospitalizations this year for multiple different reasons. The most recent was in April 2015 due to shortness of breath and volume overload. The patient presents with above-mentioned complaints for the last two days. The patient reports she did not finish her hemodialysis on Saturday because of abdominal pain. The patient denies eating outside or any sick contacts, one anyone else having similar presentation. She presented to the ED. She was afebrile, has no leukocytosis, had a benign abdominal exam. The patient had basic work-up done, which did show her to have hyperkalemia with potassium of 6. As well, she had peaked T waves. The patient was given calcium gluconate in the Emergency Department, D50 push fluids, IV insulin and p.o. Kayexalate. The patient denies any palpitation, any chest pain, any shortness of breath. Hospitalist service was requested to admit the patient for her hyperkalemia. I discussed with nephrologist on call, with a plan to have hemodialysis done first thing in the morning. The patient denies any fever, any chills, any coffee-ground emesis, any melena or bright red blood per rectum. The patient is legally blind. As well, the patient's blood pressure was uncontrolled, with systolic blood pressure upon presentation being 203.   PAST MEDICAL HISTORY: 1.  End-stage renal disease on dialysis; had incomplete dialysis last Saturday.  2.  History of multiple hospitalizations for multiple reasons,  including volume overload, diastolic congestive heart failure and shortness of breath.  3.  History of seizure disorder.  4.  Depression.  5.  Gastric bezoar.  6.  Hypertension.  7.  Medical noncompliance.  8.  Secondary hypothyroidism.  9.  Legally blind.  10.  History of multiple DVTs in the past, many years ago.   11.  Bilateral retinal hemorrhage, now legally blind.  12.  History of drug-seeking behavior, per chart.  13.  Anxiety.  14.  History of MRSA abscess last year.    ALLERGIES: ANCEF, COUMADIN, MORPHINE, SULFA, TOMATO KETCHUP.  FAMILY HISTORY: Diabetes.   SOCIAL HISTORY: No smoking, alcohol, or drug use. The patient lives at home by herself, has an Engineer, production.   HOME MEDICATIONS: 1.  Clonidine 0.2 mg oral 2 times a day. 2.  Gabapentin 300 mg oral 2 times a day.  3.  Keppra 500 mg oral 2 times a day.  4.  Labetalol 100 mg oral 2 times a day. 5.  Sensipar 30 mg oral daily.  6.  Renvela (sevelamer carbonate) 600 mg 4 tablets 3 times a day and 2 tablets with snacks.  7.  Hydralazine 50 mg oral 4 times a day   REVIEW OF SYSTEMS: CONSTITUTIONAL: The patient denies fever, chills, fatigue, weakness.  EYES: The patient is legally blind. ENT: Denies tinnitus, ear pain, hearing loss, epistaxis.  RESPIRATORY: Denies cough, wheezing, hemoptysis.  CARDIOVASCULAR: Denies chest pain, edema, palpitations.  GASTROINTESTINAL: Denies any hematemesis, coffee-ground emesis, but reports complaints of nausea, vomiting, diarrhea, abdominal pain.  GENITOURINARY: Denies dysuria, hematuria or renal colic.  ENDOCRINE: Denies polyuria, polydipsia, heat or cold intolerance.  HEMATOLOGY: Denies anemia, easy bruising, bleeding diathesis.  INTEGUMENT:  Denies acne, rash, or skin lesion.  MUSCULOSKELETAL: Denies any gout, cramps, arthritis.  NEUROLOGIC: Denies any history of CVA, transient ischemic attack, tremors, vertigo, ataxia.  PSYCHIATRIC: Denies anxiety, insomnia, or depression.   PHYSICAL  EXAMINATION: VITAL SIGNS: Temperature 97.7, pulse 105, respiratory rate 22, blood pressure 193/105, saturating 100% on oxygen.  GENERAL: Well-nourished female who looks comfortable in bed, in no apparent distress.  HEENT: Head atraumatic, normocephalic. Moist oral mucosa. Pupils are cloudy and no reflex bilaterally. Injected conjunctivae.  NECK: Supple. No thyromegaly. No lymphadenopathy. Has right external jugular IV access.  CARDIOVASCULAR: S1, S2 heard. No rubs, murmur or gallops.  LUNGS: Good air entry bilaterally. No wheezing, rales or rhonchi.  ABDOMEN: Soft, nontender, nondistended. Bowel sounds present. No rebound, no guarding.  EXTREMITIES: No edema, no clubbing, no cyanosis.  PSYCHIATRIC: Appropriate affect. Awake, alert x3. Intact judgment and insight.  NEUROLOGIC: Cranial nerves grossly intact. Motor 5/5 all extremities.  SKIN: Chronic venous status changes, ulceration on the lower extremities. No rash. Normal skin turgor.   PERTINENT LABORATORY DATA: Glucose 93, BUN 56, creatinine 10.88, sodium 135, potassium 6, chloride 99, CO2 29, ALT 26, AST 32, alkaline phosphatase 302. White blood cells 6, hemoglobin 12.2, hematocrit 38.3, platelets 282. Chest x-ray showing pulmonary edema, symmetric opacity on the right, possible superimposed pneumonia.   ASSESSMENT AND PLAN: 1.  Hyperkalemia. This is most likely from having incomplete hemodialysis in the morning. As well, the patient has a questionable history of compliance with medication. The patient received Kayexalate, calcium gluconate and D50, followed by IV insulin. The patient will receive hemodialysis first thing in the morning. Emergency Department has already identified the nephrology service. The patient will be monitored on telemetry.  2.  End-stage renal disease. We will consult nephrology to continue with hemodialysis.  3.  Hypertension, uncontrolled. We will add IV as-needed hydralazine and we will resume her back on her home  medications.  4.  Abdominal pain, vomiting, diarrhea. There was no recurrence in Emergency Department. This is most likely viral gastroenteritis. Will obtain KUB. We will keep him on as-needed nausea medicine.  5.  Evidence of volume overload on the chest x-ray. We will continue with as-needed oxygen. The patient will receive hemodialysis in the morning.  6.  History of seizure disorder. Continue with Keppra.  7.  Secondary hyperparathyroidism. Continue with Sensipar and Renvela.  8.  Deep vein thrombosis prophylaxis. Subcutaneous heparin.   CODE STATUS: Full code.   TOTAL TIME SPENT ON ADMISSION AND PATIENT CARE: 55 minutes.     ____________________________ Starleen Armsawood S. Elgergawy, MD dse:cg D: 08/03/2013 04:20:14 ET T: 08/03/2013 07:23:42 ET JOB#: [id number]  cc: Starleen Armsawood S. Elgergawy, MD, <Dictator> DAWOOD Teena IraniS ELGERGAWY MD ELECTRONICALLY SIGNED 08/13/2013 0:49

## 2014-05-28 NOTE — Discharge Summary (Signed)
PATIENT NAME:  [name], [name] MR#:  [id number] DATE OF BIRTH:  Feb 04, 1986  DATE OF ADMISSION:  03/12/2013 DATE OF DISCHARGE:  03/15/2013  DISCHARGE DIAGNOSES: 1.  Chest pain, likely musculoskeletal.  2.  Shortness of breath due to pulmonary edema in the context of end-stage renal disease.  3.  Malignant hypertension, now resolved.  4.  End-stage renal disease on hemodialysis.   SECONDARY DIAGNOSES: 1.  Hypertension.  2.  History of deep vein thrombosis.  3.  Anemia of chronic kidney disease.  4.  Diabetes.  5.  Legally blind.  6.  Seizure disorder.   CONSULTATIONS: Nephrology, Dr. Thedore Mins.   PROCEDURES AND RADIOLOGY: Chest x-ray on 02/06 showed cardiomegaly with moderate interstitial pulmonary edema. Right lower lobe interstitial space disease.   Right lower extremity Doppler showed no evidence of DVT.   HISTORY AND SHORT HOSPITAL COURSE: The patient is a 29 year old female with the above-mentioned medical problems who was admitted for shortness of breath and was found to have flash pulmonary edema. Please see Dr. Clarita Leber dictated history and physical for further details. Nephrology consultation was obtained with Dr. Thedore Mins who started her on dialysis and was slowly improving with getting inpatient hemodialysis. She was also found to have malignant hypertension which improved getting dialysis. She was close to her baseline 02/09 and is being discharged home in stable condition. She was strongly recommended to consider going to long-term acute care facility or short-term rehab, which she denied to go and just wanted to go home. The biggest concern remains is for recurrent admissions. She has had multiple admissions, pretty much one every month over the last several months. She seemed to be lacking insight into what will be her future.   PERTINENT PHYSICAL EXAMINATION ON THE DATE OF DISCHARGE: VITAL SIGNS:  On the date of discharge: Temperature 98, heart rate 88 per minute, respirations  18 per minute, blood pressure 115/77 mmHg. She is saturating 98% on room upper.  CARDIOVASCULAR: S1, S2 normal.  LUNGS: Clear to auscultation bilaterally. No wheezing, rales, rhonchi, or crepitation.  ABDOMEN: Soft, benign.  NEUROLOGIC: Nonfocal examination.  All other physical examination remained at baseline.   DISCHARGE MEDICATIONS: 1.  Acetaminophen/oxycodone 325/5 mg 1 tablet p.o. every 4 to 6 hours as needed.  2.  Clonidine 0.3 mg p.o. 3 times a day.  3.  Dilantin 300 mg p.o. 3 times a day. 4.  Gabapentin 300 mg p.o. 3 times a day. 5.  Levetiracetam 500 mg p.o. daily.  6.  Fluoxetine 40 mg p.o. daily.  7.  Trazodone 100 mg p.o. at bedtime.  8.  Labetalol 300 mg p.o. b.i.d.  9.  Renvela 800 mg 4 capsules p.o. 3 times a day. Take 4 tablets 3 times a day with meals and 2 tablets with snacks.  10.  Hydralazine 25 mg p.o. 4 times a day.  11.  Protonix 40 mg p.o. daily.  12.  Lasix 40 mg p.o. daily.  13.  Amlodipine 10 mg p.o. daily.   DISCHARGE DIET: Dialysis diet.   DISCHARGE ACTIVITY: As tolerated.   DISCHARGE INSTRUCTIONS AND FOLLOW-UP: The patient was instructed to follow up with her primary care physician in 1 to 2 weeks. She will get hemodialysis as scheduled as an outpatient. She was agreeable to get home and physical therapy nurse, and nursing aide, which has been provided by care management. She has had multiple re admissions here and still remains at high risk for readmissions. Ideal disposition would be Long term acute care  if she can be accepted.  TOTAL TIME DISCHARGING THIS PATIENT: 55 minutes.   ____________________________ Ellamae SiaVipul S. Sherryll BurgerShah, MD vss:dp D: 03/15/2013 20:10:00 ET T: 03/16/2013 06:14:58 ET JOB#: [id number]  cc: Mosetta PigeonHarmeet Singh, MD Tamatha Gadbois S. Sherryll BurgerShah, MD, <Dictator>  Ellamae SiaVIPUL S [facility] MD ELECTRONICALLY SIGNED 03/20/2013 12:17

## 2014-05-28 NOTE — Discharge Summary (Signed)
PATIENT NAME:  [name], [name] MR#:  [id number] DATE OF BIRTH:  05/08/1985  DATE OF ADMISSION:  05/18/2013 DATE OF DISCHARGE:  05/21/2013  CONSULTANTS:  1. Palliative care.  2. Dr. Cherylann Ratel from nephrology.   PRIMARY NEPHROLOGIST: Dr. Cherylann Ratel.   CHIEF COMPLAINT: Shortness of breath.   DISCHARGE DIAGNOSES:  1. Respiratory failure due to acute on chronic diastolic congestive heart failure and malignant accelerated hypertension with acute pulmonary edema.  2. End-stage renal disease on dialysis.  3. History of a seizure disorder.  4. Blindness.  5. History of chronic abdominal pain.  6.  Anemia of chronic disease.  7. History of deep venous thromboses in the past. 8. Depression.  9. History of gastric bezoar.  10. History of medical noncompliance.  11. History of drug-seeking behavior per chart.  12. Anxiety.  13. History of methicillin-resistant Staphylococcus aureus abscesses in the past.  14. History of secondary hyperparathyroidism.   DISCHARGE MEDICATIONS: Gabapentin 300 mg 3 times a day, Renvela carbonate 800 mg 4 tabs 3 times a day with meals and 2 tablets with snacks, Keppra 500 mg 2 times a day, Dilantin 100 mg extended release 3 caps orally at bedtime, labetalol 100 mg 2 times a day, clonidine 0.2 mg 2 times a day, hydralazine 50 mg 3 times a day, sucralfate 1 gram 4 times a day before meals and at bedtime, trazodone 100 mg once a day at bedtime as needed for sleep. She will be going home with the resumption of home health with PT nurse, nursing aide.   DIET: Low-sodium, renal diet.   ACTIVITY: As tolerated.   FOLLOWUP: Please follow with PCP and your nephrologist within 1-2 weeks. Please follow with your hemodialysis center for the next scheduled dialysis as previously arranged.   SIGNIFICANT LABS AND IMAGING: Initial BUN of 40, creatinine of 7.8, sodium 136. The troponins were negative x 3. Initial white count of 5, hemoglobin of 8.4, platelets of 327,000. Initial d-dimer  is 1711. Initial x-ray of the chest, PA and lateral, showing mild bilateral interstitial and alveolar airspace disease, right greater than the left. CT angio of chest for a PE protocol showing widespread congestive heart failure, pneumonia superimposed on the congestive heart failure could not be entirely excluded. No demonstrable pulmonary embolus. Repeat chest x-ray, PA and lateral, on April 17th showed enlargement of cardiac silhouette with pulmonary vascular congestion, no acute abnormalities when compared to the previous exam. Previous identified infiltrates have resolved, question resolved pulmonary edema.   HISTORY OF PRESENT ILLNESS AND HOSPITAL COURSE: For full details of the H and P, please see the dictation on April 14th by Dr. Jacques Navy, but briefly, this is a 29 year old legally blind African American female with multiple hospitalizations for shortness of breath. She has a history of noncompliance end-stage renal disease on dialysis, seizure disorder, and came in for shortness of breath.  The shortness of breath started acutely the night before admission without any fevers or productive cough. She came to the hospital where she was found to have oxygen saturation of about 86% on room air with mildly elevated d-dimer and therefore underwent a CT angio of the chest which did not show any evidence for PE but found congestive heart failure; however, pneumonia could not be excluded. At that time, she received broad-spectrum antibiotics and she was admitted to the hospitalist service with oxygen. She did not have significant fever or leukocytosis here to suggest a fever and, therefore, the antibiotics were not continued. However, the patient underwent multiple  dialysis sessions consecutively with resolution of her acute respiratory failure. At this point, she has been weaned off the oxygen. Although there is slight cough, she is not hypoxic and does not have a productive sputum. We suspect her acute  respiratory failure is due to acute on chronic diastolic CHF in the setting of malignant accelerated hypertension and acute pulmonary edema. She denied having any missed dialysis. Her blood pressure was controlled with the outpatient medications raising the possibility of medication noncompliance which her nephrologist also had noted. We repeated an x-ray of the chest, which did not show any evidence for pneumonia and her CHF had improved. She is legally blind and has an aide for 9 hours a day that helps her with her ADLs. However, the aide cannot touch her medications. However, she states that she has a visually impaired friendly pill box and is doing good with her medications. We discussed the case with case management and we will discharge her with home health RN and PT.  I did mention to the patient about the possibility of living in a higher level of care, i.e. an independent living type versus assisted living type of a facility given her multiple medical issues and visiual  limitations and she adamantly refused. She stated that she is able to take care of herself with her new visually impaired friendly pill box and insisted that she will go home.  Per case management, home health nursing has been scheduled for her previously, and at times, she does not let them into the house or answer their  telephone calls. I brought up these issues with the patient who stated that she will attempt to do a better job. Furthermore, her Dilantin level was below a therapeutic level and again raises the issue of compliance. She has had no recent seizures and here she was seizure free. At this point, given her improvement in terms of her blood pressure as well as resolving acute respiratory failure, she will be discharged with outpatient followup.   TOTAL TIME SPENT: 35 minutes.   CODE STATUS: The patient is a full code.     ____________________________ Krystal EatonShayiq Jarriel Papillion, MD sa:dd D: 05/23/2013 20:11:00 ET T: 05/24/2013  00:16:36 ET JOB#: [id number]  cc: Lennox PippinsMunsoor N. Lateef, MD Krystal EatonShayiq Winslow Verrill, MD, <Dictator>  Krystal EatonSHAYIQ Omega Slager MD ELECTRONICALLY SIGNED 06/03/2013 14:32

## 2014-05-28 NOTE — Discharge Summary (Signed)
PATIENT NAME:  [name], [name] MR#:  [id number] DATE OF BIRTH:  [birth year]  DATE OF ADMISSION:  02/16/2013 DATE OF DISCHARGE:  02/17/2013  ADMITTING PHYSICIAN: Dr. Amado Coe.  DISCHARGING PHYSICIAN: Enid Baas, M.D.   PRIMARY CARE PHYSICIAN: At [facility].   CONSULTATIONS IN HOSPITAL:  Nephrology consultation by Dr. Wynelle Link.   DISCHARGE DIAGNOSES: 1.  Acute viral gastroenteritis.  2.  Acute on chronic abdominal pain.  3.  Narcotic pain medication seeking.  4.  Malignant hypertension.  5.  End-stage renal disease on Tuesday, Thursday, Saturday hemodialysis.  6.  Depression.  7.  Seizure disorder.  8.  Gastroesophageal reflux disease.   DISCHARGE HOME MEDICATIONS: 1.  Clonidine 0.3 mg p.o. 3 times a day.  2.  Dilantin 300 mg p.o. 3 times a day.  3.  Gabapentin 300 mg p.o. 3 times a day.  4.  Keppra 500 mg p.o. once a day.  5.  Fluoxetine 40 mg p.o. daily.  6.  Trazodone 100 mg p.o. at bedtime.  7.  Labetalol 300 mg p.o. b.i.d.  8.  Renvela 800 mg tablet 4 tablets 3 times a day with meals and 2 tablets with snacks.  9.  Hydralazine 25 mg p.o. 4 times a day.  10.  Protonix 40 mg p.o. once a day.  11.  Lasix 40 mg p.o. once a day.  12.  Norvasc 10 mg p.o. once a day.  13.  Norco 5/325 mg 1 tablet q.6 hours for 5 days only.     DISCHARGE DIET: Renal dialysis diet.   DISCHARGE ACTIVITY: As tolerated.    FOLLOWUP INSTRUCTIONS: 1.  Advised to follow up for her next dialysis.  2.  PCP followup in 1 week.   LABORATORY AND IMAGING STUDIES PRIOR TO DISCHARGE:  PTH was elevated at 1495, secondary hyperparathyroidism.   WBC 4.9, hemoglobin 10.4, hematocrit 31.3, platelet count 228.   Sodium 133, potassium 5.9, chloride 99, bicarb 27, BUN 65, creatinine 9.3, glucose 76 and calcium of 8.7. Lipase 271. CT of the abdomen and pelvis on admission showing gastric bezoar, nonobstructive. Pulmonary edema, renal osteodystrophia and chronic findings in the abdomen.   BRIEF HOSPITAL COURSE: Ms.  [name] is a 29 year old African American female with legal blindness, end-stage renal disease on hemodialysis, hypertension, history of DVT, seizure disorder, multiple admissions to the hospital for chronic pain including chest pain and abdominal pain, presents to the hospital secondary to nausea, vomiting and diarrhea. She was also noted to have elevated potassium of 6.3.  1.  Acute nausea, vomiting, diarrhea with abdominal pain, likely viral gastroenteritis. Her symptoms have resolved within 1 day of admission. She was able to eat 100% of her meals without any complaints. She continued to complain of abdominal pain, which is chronic, but her abdomen was very soft on exam.  She was started on IV Dilaudid, for which she kept asking, was changed over to p.o. pain medications and was not really happy with that. As mentioned above, she had multiple admissions for acute abdominal pain. Lipase was checked, which was normal, and CT of the abdomen did not show any acute findings, so she was discharged with a five-day prescription of pain medications.  2.  End-stage renal disease.  The patient did get dialyzed per her schedule while in the hospital. Her potassium  improved from 6.3 to 5.9, and she is scheduled for dialysis on 11/18/2013.  3.  Hypertension. All of her medications were continued without any changes.   DISCHARGE CONDITION: Stable.  DISCHARGE DISPOSITION: Home.   TIME SPENT ON DISCHARGE: 40 minutes.   ____________________________ Enid Baasadhika Teren Franckowiak, MD rk:dmm D: 02/18/2013 12:33:00 ET T: 02/18/2013 13:07:40 ET JOB#: [id number]  cc: Enid Baasadhika Chenee Munns, MD, <Dictator> Enid BaasADHIKA Osmel Dykstra MD ELECTRONICALLY SIGNED 02/18/2013 14:22

## 2014-05-28 NOTE — Discharge Summary (Signed)
Dates of Admission and Diagnosis:  Date of Admission 28-Apr-2013   Date of Discharge 30-Apr-2013   Admitting Diagnosis fluid overload   Final Diagnosis Fluid overload- due to missed hemodialysis Chronic abdominal pain- likely pain seeking behaviour- will treat for possible gastritis ESRD on HD Ch anemia Hx of stomach sx and bazor removal Feb 2015.    Chief Complaint/History of Present Illness a 29 year old female who presents from home due to shortness of breath that began the past couple of days. The patient has a history of end-stage renal disease on hemodialysis, Tuesday/Thursday/Saturday. She missed her dialysis on Tuesday. She apparently says she has a doctor's appointment at [facility] and therefore she missed the dialysis. She was scheduled to have dialysis today but they could not fit her in. She tried taking an additional dose of Lasix as she felt short of breath although she does not make much urine and she did not feel much better.   She was sent over to the ER for further evaluation. She was noted to be hypoxic with O2 saturations in the mid-80's upon arrival. Hospitalist services are contacted for further treatment and evaluation due to volume overload due to missing dialysis.   The patient does not complain of any significant chest pain. She admits to shortness of breath on minimal exertion. She admits to nausea but no vomiting, no diaphoresis, no palpitations, no syncope. She does continue to complain of chronic abdominal pain which has been ongoing for quite a while.   Allergies:  Sulfa drugs: Swelling, Resp. Distress  ancef: Wheezing  Coumadin: Bleeding  Morphine: Itching  Tomato Ketchup: Itching, Swelling, Hives  TDMs:  25-Mar-15 09:36   Dilantin, Serum  7.7 (Result(s) reported on 28 Apr 2013 at 10:07AM.)  Routine Chem:  25-Mar-15 09:36   Glucose, Serum  131  BUN  52  Creatinine (comp)  9.62  Sodium, Serum  134  Potassium, Serum 4.5  Chloride, Serum 99  CO2, Serum 28   Calcium (Total), Serum  7.4  Anion Gap 7  Osmolality (calc) 284  eGFR (African American)  6  eGFR (Non-African American)  5 (eGFR values <46m/min/1.73 m2 may be an indication of chronic kidney disease (CKD). Calculated eGFR is useful in patients with stable renal function. The eGFR calculation will not be reliable in acutely ill patients when serum creatinine is changing rapidly. It is not useful in  patients on dialysis. The eGFR calculation may not be applicable to patients at the low and high extremes of body sizes, pregnant women, and vegetarians.)  Cardiac:  25-Mar-15 09:36   Troponin I < 0.02 (0.00-0.05 0.05 ng/mL or less: NEGATIVE  Repeat testing in 3-6 hrs  if clinically indicated. >0.05 ng/mL: POTENTIAL  MYOCARDIAL INJURY. Repeat  testing in 3-6 hrs if  clinically indicated. NOTE: An increase or decrease  of 30% or more on serial  testing suggests a  clinically important change)  Routine Hem:  25-Mar-15 09:36   WBC (CBC) 5.4  RBC (CBC)  2.42  Hemoglobin (CBC)  7.7  Hematocrit (CBC)  23.7  Platelet Count (CBC) 271 (Result(s) reported on 28 Apr 2013 at 09:55AM.)  MCV 98  MCH 31.9  MCHC 32.7  RDW  19.2   PERTINENT RADIOLOGY STUDIES: XRay:    26-Mar-15 09:02, Chest PA and Lateral  Chest PA and Lateral   REASON FOR EXAM:    Shortness of breath. interval change post dialysis  COMMENTS:       PROCEDURE: DXR - DXR CHEST PA (OR AP)  AND LATERAL  - Apr 29 2013  9:02AM     CLINICAL DATA:  Shortness of breath, interval change post dialysis,  weakness, unresponsive    EXAM:  CHEST  2 VIEW    COMPARISON:  DG CHEST 1V PORT dated 04/28/2013; DG CHEST 1V PORT  dated 03/12/2013; DG CHEST 1V PORT dated 01/18/2013    FINDINGS:  Grossly unchanged enlarged cardiac silhouette and mediastinal  contours. The pulmonary vasculature is less distinct on the present  examination with cephalization of flow. Worsening bilateral mid and  lower lung heterogeneous opacities.  Suspected development trace  bilateral effusions, left greater than right. No pneumothorax.  Unchangedbones.     IMPRESSION:  Worsening edema and atelectasis.      Electronically Signed    By: Sandi Mariscal M.D.    On: 04/29/2013 09:10     Verified By: Aileen Fass, M.D.,  CT:    25-Mar-15 12:25, CT Abdomen and Pelvis Without Contrast  CT Abdomen and Pelvis Without Contrast   REASON FOR EXAM:    (1) abd pain and distention; (2) abd pain and   distention  renal faiolure  COMMENTS:       PROCEDURE: CT  - CT ABDOMEN AND PELVIS W0  - Apr 28 2013 12:25PM     CLINICAL DATA:  Abdominal pain    EXAM:  CT ABDOMEN AND PELVIS WITHOUT CONTRAST    TECHNIQUE:  Multidetector CT imaging of the abdomen and pelvis was performed  following the standard protocol without intravenous contrast.  COMPARISON:  DG ACUTE ABDOMEN SERIES dated 04/15/2013    FINDINGS:  There are small bilateral pleural effusions, right greater than  left. There is bilateral lower lobe alveolar and interstitial  disease, right greater than left, concerning for multilobar  pneumonia versus pulmonary edema.    No renal, ureteral, or bladder calculi. No obstructiveuropathy. No  perinephric stranding is seen. The kidneys are symmetric in size  without evidence for exophytic mass. The bladder is unremarkable.  There is mild generalized anasarca.    The liver demonstrates no focal abnormality. The gallbladder is  unremarkable. The spleen demonstrates no focal abnormality. The  adrenal glands and pancreas are normal.    The stomach, duodenum, small intestine and large intestine are  unremarkable. Normal caliber appendix. There is no pneumoperitoneum,  pneumatosis, or portal venous gas. There is a small amount of pelvic  free fluid. There is no lymphadenopathy.    The abdominal aorta is normal in caliber .    Bilateral symmetric erosive sacroiliitis.     IMPRESSION:  1. No obstructive uropathy.  2. Bilateral small  pleural effusions and bilateral interstitial and  alveolar airspace disease which may reflect multi lobar pneumonia  versus pulmonary edema.  3. Moderate amount of stool throughout the colon. No bowel  obstruction.  4. Anasarca.  5. Bilateral symmetric erosive sacroiliitis. Was likely secondary to  renal osteodystrophy given the patient's history. Other etiologies  include inflammatory bowel disease, ankylosing spondylitis versus  multiple other processes.      Electronically Signed    [id number]  Patel    On: 04/28/2013 13:10       Verified By: Jennette Banker, M.D., MD   Pertinent Past History:  Pertinent Past History Consistent with end-stage renal disease on hemodialysis, history of seizures, depression, history of gastric bezoar, hypertension, medical noncompliance, secondary hyperparathyroidism.   Hospital Course:  Hospital Course *  Shortness of breath.  due to volume overload from patient missing her hemodialysis  and pneumonia . A chest x-ray does show some mild pulmonary edema and have some infiltrates? on  her CT scan. The patient did attempt to take some additional Lasix at home, although it did not improve her symptoms as she does not make much urine. The patient has been seen by nephrology received HD 3/25 and 3/26, X ray and CT have some evidence of Questionable infiltrates- so treat pneumonia- Levaquin.   comfortable on room air now. *  End-stage renal disease, on hemodialysis. The patient normally gets dialysis on Tuesday/Thursday/Saturday. consult nephrology. reeived HD in hospital. *  History of seizures. Continue her Dilantin and Keppra. No evidence of acute seizures.  *  Depression. Continue Prozac.  *  Hypertension. The patient is presently hemodynamically stable. continue her labetalol, hydralazine, and clonidine.  *  Chronic abdominal pain. The patient has high drug-seeking behavior. She does have a history of gastric bezoar. Her CT scan does not show any  intra-abdominal pathology. For now, will  continuing p.r.n. Norco.( she kept refusing for it and took only once last 24 hrs) give Protonix and sucralfate for Gastritis.   Condition on Discharge Stable   Code Status:  Code Status Full Code   DISCHARGE INSTRUCTIONS HOME MEDS:  Medication Reconciliation: Patient's Home Medications at Discharge:     Medication Instructions  gabapentin 300 mg oral capsule  1 cap(s) orally 3 times a day   fluoxetine 40 mg oral capsule  1 cap(s) orally once a day   trazodone 100 mg oral tablet  1 tab(s) orally once a day (at bedtime), As Needed   renvela carbonate 800 mg oral tablet  4 tab(s) orally 3 times a day (with meals) and 2 tablets with snacks   keppra 500 mg oral tablet  1 tab(s) orally 2 times a day   dilantin 100 mg oral capsule, extended release  3 cap(s) orally once a day (at bedtime)   labetalol 100 mg oral tablet  1 tab(s) orally 2 times a day   clonidine 0.2 mg oral tablet  1 tab(s) orally 2 times a day   hydralazine 50 mg oral tablet  1 tab(s) orally 3 times a day   sucralfate 1 g oral tablet  1 tab(s) orally 4 times a day (before meals and at bedtime)   pantoprazole 40 mg oral delayed release tablet  1 tab(s) orally 2 times a day   levaquin 500 mg oral tablet  1 tab(s) orally every other day x 6 days    STOP TAKING THE FOLLOWING MEDICATION(S):    cinacalcet 30 mg oral tablet: 1 tab(s) orally once a day  Physician's Instructions:  Home Oxygen? No   Diet Renal Diet   Activity Limitations As tolerated   Return to Work Not Applicable   Time frame for Follow Up Appointment 1-2 weeks   Other Comments routine follow ups at hemodialysis center.   Electronic Signatures: Vaughan Basta (MD)  (Signed 31-Mar-15 15:01)  Authored: ADMISSION DATE AND DIAGNOSIS, CHIEF COMPLAINT/HPI, Allergies, PERTINENT LABS, PERTINENT RADIOLOGY STUDIES, PERTINENT PAST HISTORY, HOSPITAL COURSE, DISCHARGE INSTRUCTIONS HOME MEDS, PATIENT  INSTRUCTIONS   Last Updated: 31-Mar-15 15:01 by Vaughan Basta (MD)

## 2014-05-28 NOTE — Discharge Summary (Signed)
PATIENT NAME:  [name], Josefita A MR#:  [id number] DATE OF BIRTH:  28-Sep-1985  DATE OF ADMISSION:  01/18/2013 DATE OF DISCHARGE:  01/19/2013  PRESENTING COMPLAINT: Nausea, vomiting and malignant hypertension.   DISCHARGE DIAGNOSES:  1. Hypertensive urgency, resolved.  2. Nausea, vomiting, improving.  3. Chronic abdominal pain.  4. End-stage renal disease, on hemodialysis.  5. Legally blind.   CODE STATUS: Full code.   MEDICATIONS AT DISCHARGE:  1. Clonidine 0.3 mg t.i.d.  2. Dilantin 100 mg extended release 3 capsules 3 times a day.  3. Gabapentin 300 mg 1 tablet 3 times a day.  4. Keppra 500 mg daily.  5. Fluoxetine 40 mg daily.  6. Trazodone 100 mg 1 tablet at bedtime.  7. Labetalol 300 mg b.i.d.  8. Renvela 800 mg 4 tablets 3 times a day with meals and 2 tablets with snack. 9. Hydralazine 25 mg 4 times a day.  10. Protonix 40 mg p.o. daily.  11. Lasix 40 mg daily 12. Valsartan 160 mg p.o. at bedtime.  13. Amlodipine 10 mg daily.   DIET: Low-sodium renal diet.   FOLLOWUP: With your nephrologist at hemodialysis. Resume your hemodialysis Tuesday, Thursday, Saturday as before.   CONSULTATION: Mosetta PigeonHarmeet Singh, MD, nephrology.   BRIEF SUMMARY OF HOSPITAL COURSE:  1. Dedra SkeensKimberly Tanton is a 29 year old female with history of end-stage renal disease, who is on hemodialysis, and hypertension, who comes to the Emergency Room with hypertensive urgency. She came in with blood pressure of 246/148. Received IV labetalol. Her pressure came down. All her home meds were resumed, and she was prescribed Norvasc and valsartan by Dr. Thedore MinsSingh, including her other home BP medications.  2. Nausea, vomiting and abdominal pain. The patient has history of chronic abdominal pain. Her nausea and vomiting could be viral syndrome or due to elevated blood pressure. She was tolerating p.o. diet well. Her abdominal CAT scan essentially negative. The patient does have issues with chronic pain, and narcotics were  avoided.  3. Chronic opiate abuse. The patient does have a history of narcotic use in the past. She received 2 doses of IV Dilaudid, currently not vomiting. Blood pressure is stable. CT of the abdomen did not show any acute pathology. Will hold off on giving further narcotics.  4. Seizure disorder, on Keppra and Dilantin.  5. Legally blind. The [facility].  6. End-stage renal disease. Her inpatient dialysis was continued. 7. Hospital stay otherwise remained stable.   CODE STATUS: The patient remained a full code.   TIME SPENT: 40 minutes.   ____________________________ Wylie HailSona A. Allena KatzPatel, MD sap:lb D: 01/21/2013 11:54:41 ET T: 01/21/2013 12:12:04 ET JOB#: [id number]  cc: Adaijah Endres A. Allena KatzPatel, MD, <Dictator> Mosetta PigeonHarmeet Singh, MD Willow OraSONA A Burgundy Matuszak MD ELECTRONICALLY SIGNED 03/01/2013 15:04

## 2014-05-28 NOTE — Consult Note (Signed)
Pt seen and examined. Full consult to follow. Pt with long standing DM and ESRD on HD, who presents with acute bout of nausea/vomiting/abd pain. CT without contrast shows large gastric bezoar. Patient may have diabetic gastroparesis. Will plan EGD this AM to attempt removal of bezoar. Otherwise, bezoar may enlarge to the point where surgical removal may be necessary. Will follow. Thanks.  Electronic Signatures: Lutricia Feilh, Osher Oettinger (MD)  (Signed on 13-Feb-15 07:30)  Authored  Last Updated: 13-Feb-15 07:30 by Lutricia Feilh, Tiernan Millikin (MD)
